# Patient Record
Sex: Male | Born: 1940 | Race: White | Hispanic: No | Marital: Single | Smoking: Former smoker
Health system: Southern US, Community
[De-identification: ages and names within clinical notes are randomized; demographics above are authoritative.]

## PROBLEM LIST (undated history)

## (undated) DIAGNOSIS — C4491 Basal cell carcinoma of skin, unspecified: Secondary | ICD-10-CM

## (undated) DIAGNOSIS — K5900 Constipation, unspecified: Secondary | ICD-10-CM

## (undated) DIAGNOSIS — I5042 Chronic combined systolic (congestive) and diastolic (congestive) heart failure: Secondary | ICD-10-CM

## (undated) DIAGNOSIS — Z9289 Personal history of other medical treatment: Secondary | ICD-10-CM

## (undated) DIAGNOSIS — G8929 Other chronic pain: Secondary | ICD-10-CM

## (undated) DIAGNOSIS — I351 Nonrheumatic aortic (valve) insufficiency: Secondary | ICD-10-CM

## (undated) DIAGNOSIS — I739 Peripheral vascular disease, unspecified: Secondary | ICD-10-CM

## (undated) DIAGNOSIS — I4821 Permanent atrial fibrillation: Secondary | ICD-10-CM

## (undated) DIAGNOSIS — K921 Melena: Secondary | ICD-10-CM

## (undated) DIAGNOSIS — Z89421 Acquired absence of other right toe(s): Secondary | ICD-10-CM

## (undated) DIAGNOSIS — N183 Chronic kidney disease, stage 3 (moderate): Secondary | ICD-10-CM

## (undated) DIAGNOSIS — Z87442 Personal history of urinary calculi: Secondary | ICD-10-CM

## (undated) DIAGNOSIS — I82409 Acute embolism and thrombosis of unspecified deep veins of unspecified lower extremity: Secondary | ICD-10-CM

## (undated) DIAGNOSIS — E785 Hyperlipidemia, unspecified: Secondary | ICD-10-CM

## (undated) DIAGNOSIS — IMO0002 Reserved for concepts with insufficient information to code with codable children: Secondary | ICD-10-CM

## (undated) DIAGNOSIS — K432 Incisional hernia without obstruction or gangrene: Secondary | ICD-10-CM

## (undated) DIAGNOSIS — D649 Anemia, unspecified: Secondary | ICD-10-CM

## (undated) DIAGNOSIS — I5043 Acute on chronic combined systolic (congestive) and diastolic (congestive) heart failure: Secondary | ICD-10-CM

## (undated) DIAGNOSIS — R0600 Dyspnea, unspecified: Secondary | ICD-10-CM

## (undated) DIAGNOSIS — I10 Essential (primary) hypertension: Secondary | ICD-10-CM

## (undated) DIAGNOSIS — M1990 Unspecified osteoarthritis, unspecified site: Secondary | ICD-10-CM

## (undated) DIAGNOSIS — I219 Acute myocardial infarction, unspecified: Secondary | ICD-10-CM

## (undated) DIAGNOSIS — J189 Pneumonia, unspecified organism: Secondary | ICD-10-CM

## (undated) DIAGNOSIS — M1460 Charcot's joint, unspecified site: Secondary | ICD-10-CM

## (undated) DIAGNOSIS — J449 Chronic obstructive pulmonary disease, unspecified: Secondary | ICD-10-CM

## (undated) DIAGNOSIS — I2510 Atherosclerotic heart disease of native coronary artery without angina pectoris: Secondary | ICD-10-CM

## (undated) DIAGNOSIS — R011 Cardiac murmur, unspecified: Secondary | ICD-10-CM

## (undated) DIAGNOSIS — M109 Gout, unspecified: Secondary | ICD-10-CM

## (undated) DIAGNOSIS — I771 Stricture of artery: Secondary | ICD-10-CM

## (undated) DIAGNOSIS — M869 Osteomyelitis, unspecified: Secondary | ICD-10-CM

## (undated) DIAGNOSIS — Z8489 Family history of other specified conditions: Secondary | ICD-10-CM

## (undated) HISTORY — PX: INGUINAL HERNIA REPAIR: SUR1180

## (undated) HISTORY — DX: Essential (primary) hypertension: I10

## (undated) HISTORY — PX: CATARACT EXTRACTION W/ INTRAOCULAR LENS  IMPLANT, BILATERAL: SHX1307

## (undated) HISTORY — PX: ANKLE FRACTURE SURGERY: SHX122

## (undated) HISTORY — PX: FRACTURE SURGERY: SHX138

## (undated) HISTORY — DX: Acute myocardial infarction, unspecified: I21.9

## (undated) HISTORY — DX: Osteomyelitis, unspecified: M86.9

## (undated) HISTORY — PX: LUMBAR SPINE SURGERY: SHX701

## (undated) HISTORY — DX: Other chronic pain: G89.29

## (undated) HISTORY — PX: FEMORAL-POPLITEAL BYPASS GRAFT: SHX937

## (undated) HISTORY — PX: CARDIAC CATHETERIZATION: SHX172

## (undated) HISTORY — DX: Chronic obstructive pulmonary disease, unspecified: J44.9

## (undated) HISTORY — DX: Peripheral vascular disease, unspecified: I73.9

## (undated) HISTORY — DX: Acquired absence of other right toe(s): Z89.421

## (undated) HISTORY — PX: COLONOSCOPY: SHX174

## (undated) HISTORY — PX: PERCUTANEOUS CORONARY STENT INTERVENTION (PCI-S): SHX6016

## (undated) HISTORY — PX: TONSILLECTOMY: SUR1361

## (undated) HISTORY — DX: Hyperlipidemia, unspecified: E78.5

---

## 2003-02-21 HISTORY — PX: CORONARY ANGIOPLASTY WITH STENT PLACEMENT: SHX49

## 2010-01-19 DIAGNOSIS — R32 Unspecified urinary incontinence: Secondary | ICD-10-CM | POA: Insufficient documentation

## 2010-03-10 DIAGNOSIS — N400 Enlarged prostate without lower urinary tract symptoms: Secondary | ICD-10-CM | POA: Insufficient documentation

## 2010-03-10 DIAGNOSIS — R600 Localized edema: Secondary | ICD-10-CM | POA: Insufficient documentation

## 2011-03-23 DIAGNOSIS — M549 Dorsalgia, unspecified: Secondary | ICD-10-CM | POA: Insufficient documentation

## 2011-05-31 DIAGNOSIS — R972 Elevated prostate specific antigen [PSA]: Secondary | ICD-10-CM | POA: Insufficient documentation

## 2012-09-04 DIAGNOSIS — Z7901 Long term (current) use of anticoagulants: Secondary | ICD-10-CM | POA: Insufficient documentation

## 2012-12-05 ENCOUNTER — Encounter: Payer: Self-pay | Admitting: Family Medicine

## 2012-12-05 ENCOUNTER — Ambulatory Visit (INDEPENDENT_AMBULATORY_CARE_PROVIDER_SITE_OTHER): Payer: Medicare Other | Admitting: Family Medicine

## 2012-12-05 ENCOUNTER — Ambulatory Visit (INDEPENDENT_AMBULATORY_CARE_PROVIDER_SITE_OTHER): Payer: Medicare Other | Admitting: *Deleted

## 2012-12-05 VITALS — BP 136/51 | HR 70 | Temp 97.9°F | Ht 77.0 in | Wt 316.6 lb

## 2012-12-05 DIAGNOSIS — I4891 Unspecified atrial fibrillation: Secondary | ICD-10-CM

## 2012-12-05 DIAGNOSIS — E785 Hyperlipidemia, unspecified: Secondary | ICD-10-CM

## 2012-12-05 DIAGNOSIS — I10 Essential (primary) hypertension: Secondary | ICD-10-CM

## 2012-12-05 DIAGNOSIS — Z87891 Personal history of nicotine dependence: Secondary | ICD-10-CM

## 2012-12-05 DIAGNOSIS — Z23 Encounter for immunization: Secondary | ICD-10-CM

## 2012-12-05 DIAGNOSIS — Z0000 Encounter for general adult medical examination without abnormal findings: Secondary | ICD-10-CM | POA: Insufficient documentation

## 2012-12-05 DIAGNOSIS — Z87892 Personal history of anaphylaxis: Secondary | ICD-10-CM

## 2012-12-05 DIAGNOSIS — G8929 Other chronic pain: Secondary | ICD-10-CM | POA: Insufficient documentation

## 2012-12-05 DIAGNOSIS — M549 Dorsalgia, unspecified: Secondary | ICD-10-CM

## 2012-12-05 DIAGNOSIS — I739 Peripheral vascular disease, unspecified: Secondary | ICD-10-CM | POA: Insufficient documentation

## 2012-12-05 DIAGNOSIS — J3489 Other specified disorders of nose and nasal sinuses: Secondary | ICD-10-CM

## 2012-12-05 DIAGNOSIS — I2510 Atherosclerotic heart disease of native coronary artery without angina pectoris: Secondary | ICD-10-CM

## 2012-12-05 DIAGNOSIS — L989 Disorder of the skin and subcutaneous tissue, unspecified: Secondary | ICD-10-CM

## 2012-12-05 DIAGNOSIS — M109 Gout, unspecified: Secondary | ICD-10-CM

## 2012-12-05 DIAGNOSIS — E119 Type 2 diabetes mellitus without complications: Secondary | ICD-10-CM

## 2012-12-05 LAB — POCT INR: INR: 2.5

## 2012-12-05 MED ORDER — EPINEPHRINE 0.3 MG/0.3ML IJ SOAJ: 0.3000 mg | Freq: Once | INTRAMUSCULAR | Status: DC

## 2012-12-05 MED ORDER — NITROGLYCERIN 0.4 MG SL SUBL: 0.4000 mg | SUBLINGUAL_TABLET | SUBLINGUAL | Status: DC | PRN

## 2012-12-05 NOTE — Assessment & Plan Note (Signed)
Currently On Lovastatin. Will obtain fasting lipid panel.

## 2012-12-05 NOTE — Progress Notes (Signed)
Subjective:     Patient ID: Terry Martinez, male   DOB: [birth year], 72 y.o.   MRN: [id number]  HPI 72 year old male presents to the clinic to establish care. Concerns today:  1) INR check  - He needs INR check.  He is on Coumadin for Atrial fibrillation.  - Has not had check for months. - No reports of increased bleeding or blood in urine/stool.   2) Need for Influenza Vaccination - Patient requesting influenza vaccination today.  3) Skin lesion  - Patient reports a history of skin cancer. - He has recently developed a skin lesion on right side of his nose. - This area was scratched and bleeds easily.  It has not healed over several months. - No change in color or size.  He is concerned given his history and slow healing.  PMH, Surgical history, Family history, Social history, and medications reviewed and updated in the medical record.   Review of Systems  General:  Negative for nexplained weight loss, fever Skin: Positive sore that won't heal HEENT: Negative for trouble hearing, trouble seeing, ringing in ears, mouth sores, hoarseness, change in voice, dysphagia. CV:  Negative for chest pain, dyspnea, palpitations; Patient reports LE edema. Resp:  Positive for cough; negative for dyspnea, hemoptysis.  GI: Negative for nausea, vomiting, diarrhea, abdominal pain, melena, hematochezia. Positive for constipation. GU: Negative for dysuria, hematuria, vaginal or penile discharge, polyuria, sexual difficulty, lumps in testicle or breasts; Positive for urinary incontinence.  MSK: Positive for muscle cramps or aches, joint pain or swelling Neuro: Negative for headaches, dizziness, passing out/fainting.  Positive for weakness and numbness of LE. Psych: Negative for depression, anxiety, memory problems    Objective:   Physical Exam Filed Vitals:   12/05/12 1348  BP: 136/51  Pulse: 70  Temp: 97.9 F (36.6 C)   Exam: General: well appearing obese elderly gentlemen in  NAD. Cardiovascular: Irregularly irregular. No murmurs, rubs, or gallops. Respiratory: CTAB. No rales, rhonchi, or wheeze. Abdomen: obese soft, nontender, nondistended.  2 large ventral hernias noted lateral to large midline scar. Extremities: chronic venous stasis changes noted bilaterally. 1-2+ Pitting LE pretibial edema.      Assessment:     See Problem List     Plan:

## 2012-12-05 NOTE — Assessment & Plan Note (Signed)
-   Influenza vaccine given today 

## 2012-12-05 NOTE — Assessment & Plan Note (Signed)
Discussed biopsy with attending Dr. Jennette Kettle. Given location and non-healing nature, will refer to Dermatology.

## 2012-12-05 NOTE — Assessment & Plan Note (Signed)
At goal.  Continue ACEI, Metoprolol, and Lasix. CMP to be obtained when patient returns for fasting bloodwork.

## 2012-12-05 NOTE — Assessment & Plan Note (Signed)
Given cardiac history will refer to cardiology so that patient can be monitored closely.

## 2012-12-05 NOTE — Patient Instructions (Signed)
It was nice to see you today.  I will check your Coumadin levels today.  Please return at your earliest convenience (fasting) for the remainder of your lab work.  Given your cardiac history, I am going to refer you to Cardiology.  Additionally, for the lesion on your nose (given the location) I am going to refer you to dermatology.   Follow up in 1 -3 months or earlier if needed.   Continue to take your medications as prescribed.

## 2012-12-05 NOTE — Assessment & Plan Note (Signed)
INR obtained today - 2.5. Patient to continue current Warfarin regimen.

## 2012-12-06 ENCOUNTER — Other Ambulatory Visit (INDEPENDENT_AMBULATORY_CARE_PROVIDER_SITE_OTHER): Payer: Medicare Other

## 2012-12-06 DIAGNOSIS — E119 Type 2 diabetes mellitus without complications: Secondary | ICD-10-CM

## 2012-12-06 DIAGNOSIS — I10 Essential (primary) hypertension: Secondary | ICD-10-CM

## 2012-12-06 LAB — COMPREHENSIVE METABOLIC PANEL WITH GFR
ALT: 11 U/L (ref 0–53)
AST: 14 U/L (ref 0–37)
Albumin: 3.8 g/dL (ref 3.5–5.2)
Alkaline Phosphatase: 70 U/L (ref 39–117)
BUN: 21 mg/dL (ref 6–23)
CO2: 28 meq/L (ref 19–32)
Calcium: 9.4 mg/dL (ref 8.4–10.5)
Chloride: 104 meq/L (ref 96–112)
Creat: 1.15 mg/dL (ref 0.50–1.35)
Glucose, Bld: 105 mg/dL — ABNORMAL HIGH (ref 70–99)
Potassium: 4.5 meq/L (ref 3.5–5.3)
Sodium: 139 meq/L (ref 135–145)
Total Bilirubin: 0.5 mg/dL (ref 0.3–1.2)
Total Protein: 6.6 g/dL (ref 6.0–8.3)

## 2012-12-06 LAB — CBC
HCT: 39.4 % (ref 39.0–52.0)
Hemoglobin: 13.5 g/dL (ref 13.0–17.0)
MCH: 31.4 pg (ref 26.0–34.0)
MCHC: 34.3 g/dL (ref 30.0–36.0)
RBC: 4.3 MIL/uL (ref 4.22–5.81)

## 2012-12-06 LAB — LIPID PANEL
HDL: 31 mg/dL — ABNORMAL LOW (ref >39)
Total CHOL/HDL Ratio: 2 Ratio
Triglycerides: 61 mg/dL (ref <150)

## 2012-12-06 LAB — POCT GLYCOSYLATED HEMOGLOBIN (HGB A1C): Hemoglobin A1C: 5.7

## 2012-12-06 NOTE — Progress Notes (Signed)
CMP,CBC,FLP AND A1C DONE TODAY Terry Martinez 

## 2012-12-09 ENCOUNTER — Encounter: Payer: Self-pay | Admitting: Family Medicine

## 2013-01-02 ENCOUNTER — Telehealth: Payer: Self-pay | Admitting: Family Medicine

## 2013-01-02 ENCOUNTER — Ambulatory Visit (INDEPENDENT_AMBULATORY_CARE_PROVIDER_SITE_OTHER): Payer: Medicare Other | Admitting: *Deleted

## 2013-01-02 DIAGNOSIS — I4891 Unspecified atrial fibrillation: Secondary | ICD-10-CM

## 2013-01-02 NOTE — Telephone Encounter (Signed)
Pt's sister states that they have not heard anything concerning referral for skin and heart doctors.

## 2013-01-03 NOTE — Telephone Encounter (Signed)
I spoke with Terry Martinez his sister and gave to her Cardiology appt inf I still working in his dermatology appt referral .   Marines

## 2013-01-08 ENCOUNTER — Ambulatory Visit (INDEPENDENT_AMBULATORY_CARE_PROVIDER_SITE_OTHER): Payer: Medicare Other | Admitting: Family Medicine

## 2013-01-08 VITALS — BP 156/64 | HR 64 | Temp 98.6°F | Ht 77.0 in | Wt 379.0 lb

## 2013-01-08 DIAGNOSIS — G8929 Other chronic pain: Secondary | ICD-10-CM

## 2013-01-08 DIAGNOSIS — J3489 Other specified disorders of nose and nasal sinuses: Secondary | ICD-10-CM

## 2013-01-08 DIAGNOSIS — I4891 Unspecified atrial fibrillation: Secondary | ICD-10-CM

## 2013-01-08 DIAGNOSIS — L989 Disorder of the skin and subcutaneous tissue, unspecified: Secondary | ICD-10-CM

## 2013-01-08 DIAGNOSIS — I10 Essential (primary) hypertension: Secondary | ICD-10-CM

## 2013-01-08 DIAGNOSIS — M549 Dorsalgia, unspecified: Secondary | ICD-10-CM

## 2013-01-08 MED ORDER — ALLOPURINOL 100 MG PO TABS: 100.0000 mg | ORAL_TABLET | Freq: Every evening | ORAL | Status: DC

## 2013-01-08 MED ORDER — ZOSTER VACCINE LIVE 19400 UNT/0.65ML ~~LOC~~ SOLR: 0.6500 mL | Freq: Once | SUBCUTANEOUS | Status: DC

## 2013-01-08 MED ORDER — WARFARIN SODIUM 5 MG PO TABS: 5.0000 mg | ORAL_TABLET | Freq: Every day | ORAL | Status: DC

## 2013-01-08 MED ORDER — OXYCODONE-ACETAMINOPHEN 10-325 MG PO TABS: 1.0000 | ORAL_TABLET | Freq: Three times a day (TID) | ORAL | Status: DC | PRN

## 2013-01-08 MED ORDER — ALBUTEROL SULFATE HFA 108 (90 BASE) MCG/ACT IN AERS: 2.0000 | INHALATION_SPRAY | Freq: Four times a day (QID) | RESPIRATORY_TRACT | Status: DC | PRN

## 2013-01-08 NOTE — Patient Instructions (Signed)
Appt on 12/02 @10 :30  [facility] [address]  [place] [address] [phone]  I have refilled your medications.   Follow up in ~ 6 months.  I can refill your medication before then if needed.

## 2013-01-09 NOTE — Assessment & Plan Note (Signed)
Patient to see derm in December.

## 2013-01-09 NOTE — Assessment & Plan Note (Signed)
Percocet refilled today.

## 2013-01-09 NOTE — Assessment & Plan Note (Signed)
Nearly at goal. Meds refilled today.

## 2013-01-09 NOTE — Assessment & Plan Note (Signed)
Warfarin refilled today.   

## 2013-01-09 NOTE — Progress Notes (Signed)
Subjective:     Patient ID: Terry Martinez, male   DOB: 06/09/1940, 72 y.o.   MRN: [id number]  HPI 72 year old male presents for follow up.  1) Chronic back pain - Patient has long-standing chronic back pain.  He has had back surgery (rod placed). - This is managed with PRN Percocet - He is need of refill today. - No worsening in pain.  No reports of LE weakness, numbness, tingling.  2) HTN Disease Monitoring: Home BP Monitoring - No Chest pain- No    Dyspnea- No Medications: Enalapril, Lopressor Compliance-  Yes. Lightheadedness-  No  Edema- No  3) Non Healing Lesion of face - Nose - Lesion is now improving - He is scheduled to see Derm in [facility] on December 2nd.  Review of Systems Per HPI    Objective:   Physical Exam Filed Vitals:   01/08/13 1459  BP: 156/64  Pulse: 64  Temp: 98.6 F (37 C)   Exam: General: well developed, well nourished in NAD. Cardiovascular: RRR. No murmurs, rubs, or gallops. Respiratory: CTAB. No rales, rhonchi, or wheeze. Abdomen: obese, soft, nontender, nondistended. Extremities: warm, well perfused. No LE edema. Skin: Lesion on nose appears to be healing at this time.      Assessment:     See Problem List    Plan:

## 2013-01-20 ENCOUNTER — Encounter: Payer: Self-pay | Admitting: Internal Medicine

## 2013-01-20 ENCOUNTER — Ambulatory Visit (INDEPENDENT_AMBULATORY_CARE_PROVIDER_SITE_OTHER): Payer: Medicare Other | Admitting: Internal Medicine

## 2013-01-20 ENCOUNTER — Telehealth: Payer: Self-pay | Admitting: Internal Medicine

## 2013-01-20 VITALS — BP 120/70 | HR 67 | Ht 77.0 in | Wt 313.8 lb

## 2013-01-20 DIAGNOSIS — I10 Essential (primary) hypertension: Secondary | ICD-10-CM

## 2013-01-20 DIAGNOSIS — I2510 Atherosclerotic heart disease of native coronary artery without angina pectoris: Secondary | ICD-10-CM

## 2013-01-20 DIAGNOSIS — I739 Peripheral vascular disease, unspecified: Secondary | ICD-10-CM

## 2013-01-20 DIAGNOSIS — E785 Hyperlipidemia, unspecified: Secondary | ICD-10-CM

## 2013-01-20 NOTE — Patient Instructions (Signed)
Your physician wants you to follow-up in: 7 MONTHS WITH DR. ROSS You will receive a reminder letter in the mail two months in advance. If you don't receive a letter, please call our office to schedule the follow-up appointment.  Your physician recommends that you continue on your current medications as directed. Please refer to the Current Medication list given to you today.

## 2013-01-20 NOTE — Telephone Encounter (Signed)
ROI faxed to   1. [facility]@ [phone]/call back [phone] 2.[facility] @  [phone]/call back [phone]  01/20/13/KM

## 2013-01-20 NOTE — Progress Notes (Signed)
HPI Moved back to [place] in Sept Dr. Clearance Coots in [place] ([facility]) History of atrial fibrillation.  Has had echo History of CAD  Has 6 stents.   Last in July 2014 Hx of PAD Hadax   fem bypass (Dr Kathrine Haddock) Breathing is overall good  Has a cold right now  Greenish/grayish  Lasted 3 to 4 days.  Getting better No fevers.   No CP Actually has had nol chest pain in past Sweating with MI  In 2005 NO palpittions     Allergies  Allergen Reactions  . Zocor [Simvastatin] Hives    Current Outpatient Prescriptions  Medication Sig Dispense Refill  . acetaminophen (TYLENOL) 500 MG tablet Take 500 mg by mouth every 6 (six) hours as needed for pain.      [name] albuterol (PROVENTIL HFA;VENTOLIN HFA) 108 (90 BASE) MCG/ACT inhaler Inhale 2 puffs into the lungs every 6 (six) hours as needed for wheezing or shortness of breath.  1 Inhaler  2  . allopurinol (ZYLOPRIM) 100 MG tablet Take 1 tablet (100 mg total) by mouth every evening.  90 tablet  3  . allopurinol (ZYLOPRIM) 300 MG tablet Take 300 mg by mouth every morning.      . enalapril (VASOTEC) 10 MG tablet Take 10 mg by mouth daily.      [name] EPINEPHrine (EPI-PEN) 0.3 mg/0.3 mL SOAJ injection Inject 0.3 mLs (0.3 mg total) into the muscle once.  1 Device  1  . ferrous fumarate (HEMOCYTE - 106 MG FE) 325 (106 FE) MG TABS tablet Take 1 tablet by mouth every evening.      . furosemide (LASIX) 40 MG tablet Take 40 mg by mouth daily.      [name] lovastatin (MEVACOR) 20 MG tablet Take 20 mg by mouth at bedtime.      . metoprolol tartrate (LOPRESSOR) 25 MG tablet Take 25 mg by mouth 2 (two) times daily.      . Multiple Vitamins-Minerals (MENS MULTIVITAMIN PLUS PO) Take 1 tablet by mouth daily.      . nitroGLYCERIN (NITROSTAT) 0.4 MG SL tablet Place 1 tablet (0.4 mg total) under the tongue every 5 (five) minutes as needed for chest pain.  90 tablet  3  . oxyCODONE-acetaminophen (PERCOCET) 10-325 MG per tablet Take 1 tablet by mouth every 8 (eight) hours as needed  for pain.  90 tablet  0  . potassium chloride SA (K-DUR,KLOR-CON) 20 MEQ tablet Take 20 mEq by mouth daily.      [name] warfarin (COUMADIN) 5 MG tablet Take 1 tablet (5 mg total) by mouth daily.  90 tablet  3  . zoster vaccine live, PF, (ZOSTAVAX) 16109 UNT/0.65ML injection Inject 19,400 Units into the skin once.  1 each  0   No current facility-administered medications for this visit.    Past Medical History  Diagnosis Date  . Myocardial infarction   . Hyperlipidemia   . Hypertension   . COPD (chronic obstructive pulmonary disease)   . Peripheral artery disease   . Atrial fibrillation   . Chronic pain     Past Surgical History  Procedure Laterality Date  . Hernia repair    . Femoral-popliteal bypass graft    . Percutaneous coronary stent intervention (pci-s)    . Back surgery    . Ankle surgery      Family History  Problem Relation Age of Onset  . Diabetes Mother   . Cancer Mother   . Heart disease Mother   . Hyperlipidemia  Mother   . Hypertension Mother   . Cancer Father   . Cancer Brother   . Heart disease Brother   . Depression Brother   . Early death Brother   . Hyperlipidemia Brother   . Hypertension Brother   . Alcohol abuse Sister   . Heart disease Sister   . Hyperlipidemia Sister   . Hypertension Sister   . Stroke Sister   . Heart disease Maternal Grandmother   . Kidney disease Maternal Grandmother   . Heart disease Maternal Grandfather   . Kidney disease Maternal Grandfather     History   Social History  . Marital Status: Married    Spouse Name: N/A    Number of Children: N/A  . Years of Education: N/A   Occupational History  . Not on file.   Social History Main Topics  . Smoking status: Former Smoker    Quit date: 07/22/1998  . Smokeless tobacco: Current User  . Alcohol Use: No  . Drug Use: Not on file  . Sexual Activity: No   Other Topics Concern  . Not on file   Social History Narrative  . No narrative on file    Review of  Systems:  All systems reviewed.  They are negative to the above problem except as previously stated.  Vital Signs: BP 120/70  Pulse 67  Ht 6\' 5"  (1.956 m)  Wt 313 lb 12.8 oz (142.339 kg)  BMI 37.20 kg/m2  Physical Exam Patient is a morbidly obese 72 yo in NAD HEENT:  Normocephalic, atraumatic. EOMI, PERRLA.  Neck: JVP is normal.  No bruits.  Lungs: clear to auscultation. No rales no wheezes. COurse cough Heart: Regular rate and rhythm. Normal S1, S2. No S3.   No significant murmurs. PMI not displaced.  Abdomen:  Supple.  Minimal diffuse tnederness.  Large ventral hernia that easily reduces.  Extremities:  No signif edema.  Chronic stasis changes.  1+PT R  2+ PT L  Musculoskeletal :moving all extremities.  Neuro:   alert and oriented x3.  CN II-XII grossly intact.  EKG  Atrial fib 67 bpm  Assessment and Plan:  1.  CAD  No symptoms of angina  Need to get records from [facility]  2.  PV disease.  Again, doing OK  Wants to keep f/u down in [place].  Will get records for review  3.  HTN  Good control  Continue meds  4.  HL  Will review.    F/U next July.

## 2013-01-22 ENCOUNTER — Telehealth: Payer: Self-pay | Admitting: Family Medicine

## 2013-01-22 ENCOUNTER — Telehealth: Payer: Self-pay | Admitting: Internal Medicine

## 2013-01-22 NOTE — Telephone Encounter (Signed)
Mr. Belson brother- in-law calling to inquire about rx for the alloprinol 300 mg that wasn't called in with the one for the 100 mg.  Should have been sent along with all the others.  Require 90 day quantity.  Pt down to 2 pills left.  Need to send in asap.  Please call house if there is any question regarding this.

## 2013-01-22 NOTE — Telephone Encounter (Signed)
Records rec From [facility], gave to Scheduling Dept  01/22/13/KM

## 2013-01-23 MED ORDER — ALLOPURINOL 100 MG PO TABS: 100.0000 mg | ORAL_TABLET | Freq: Every evening | ORAL | Status: DC

## 2013-01-23 NOTE — Telephone Encounter (Signed)
Rx sent 

## 2013-01-28 ENCOUNTER — Ambulatory Visit (INDEPENDENT_AMBULATORY_CARE_PROVIDER_SITE_OTHER): Payer: Medicare Other | Admitting: *Deleted

## 2013-01-28 ENCOUNTER — Telehealth: Payer: Self-pay | Admitting: *Deleted

## 2013-01-28 ENCOUNTER — Telehealth: Payer: Self-pay | Admitting: Family Medicine

## 2013-01-28 DIAGNOSIS — I4891 Unspecified atrial fibrillation: Secondary | ICD-10-CM

## 2013-01-28 LAB — POCT INR: INR: 2.9

## 2013-01-28 MED ORDER — ALLOPURINOL 300 MG PO TABS: 300.0000 mg | ORAL_TABLET | Freq: Every morning | ORAL | Status: DC

## 2013-01-28 NOTE — Telephone Encounter (Signed)
Patient is needing a refill of Allopurinol 300mg  called in to [facility] in [place].   He has been out since last week and needs it badly.

## 2013-01-28 NOTE — Telephone Encounter (Signed)
Received a call from the skin surgery center requesting NPI number on this patient, number was given.Terry Martinez, [name]

## 2013-01-30 ENCOUNTER — Ambulatory Visit: Payer: Medicare Other

## 2013-02-04 ENCOUNTER — Telehealth: Payer: Self-pay | Admitting: Internal Medicine

## 2013-02-04 NOTE — Telephone Encounter (Signed)
Records rec From [facility], Will Hold till [place] back In Office

## 2013-02-06 ENCOUNTER — Other Ambulatory Visit: Payer: Self-pay | Admitting: Family Medicine

## 2013-02-06 ENCOUNTER — Encounter: Payer: Self-pay | Admitting: Sports Medicine

## 2013-02-06 ENCOUNTER — Ambulatory Visit (INDEPENDENT_AMBULATORY_CARE_PROVIDER_SITE_OTHER): Payer: Medicare Other | Admitting: Sports Medicine

## 2013-02-06 ENCOUNTER — Ambulatory Visit
Admission: RE | Admit: 2013-02-06 | Discharge: 2013-02-06 | Disposition: A | Discharge status: Home / Self Care | Payer: Medicare Other | Source: Ambulatory Visit | Attending: Family Medicine | Admitting: Family Medicine

## 2013-02-06 VITALS — BP 112/99 | HR 70 | Temp 98.1°F | Ht 77.0 in | Wt 316.0 lb

## 2013-02-06 DIAGNOSIS — R05 Cough: Secondary | ICD-10-CM

## 2013-02-06 MED ORDER — OXYCODONE-ACETAMINOPHEN 10-325 MG PO TABS: 1.0000 | ORAL_TABLET | Freq: Three times a day (TID) | ORAL | Status: DC | PRN

## 2013-02-06 MED ORDER — AZITHROMYCIN 250 MG PO TABS: ORAL_TABLET | ORAL | Status: DC

## 2013-02-06 NOTE — Patient Instructions (Signed)
   Go get a chest x-ray  Keep Vaseline on your foot until this is healed.  If it starts turning red or you start having worsening fevers or chills please call us back.   Please followup with Dr. Adriana Simas to discuss your ongoing pain management.   If you need anything prior to your next visit please call the clinic. Please Bring all medications or accurate medication list with you to each appointment; an accurate medication list is essential in providing you the best care possible.

## 2013-02-06 NOTE — Progress Notes (Signed)
  Terry Martinez - 72 y.o. male MRN [id number]  Date of birth: [birth year]  CC, HPI, INTERVAL HISTORY & ROS  Terry Martinez is here today for subacute URI like symptoms, right foot lesion    He reports he has been coughing for approximately 10 days; productive green mucus.  He's been running subjective fevers. Nasal congestion and rhinorrhea, no hearing changes or ear fullness.  Has not tried any specific treatments but seems to be worsening  He has no known sick contacts.  Denies nausea, vomiting, diarrhea.  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  Noted 3 days ago a small area of eschar.  Has been treating with antibacterial ointment.  No pain or erythema.  Pt denies chest pain, dyspnea at rest or exertion, PND, lower extremity edema.  History  Past Medical, Surgical, Social, and Family History Reviewed per EMR Medications and Allergies reviewed and all updated if necessary. Objective Findings  VITALS: HR: 70 bpm  BP: 112/99 mmHg  TEMP: 98.1 F (36.7 C) (Oral)  RESP: 97 %  HT: 6\' 5"  (195.6 cm)  WT: 316 lb (143.337 kg)  BMI: 37.6   BP Readings from Last 3 Encounters:  02/06/13 112/99  01/20/13 120/70  01/08/13 156/64   Wt Readings from Last 3 Encounters:  02/06/13 316 lb (143.337 kg)  01/20/13 313 lb 12.8 oz (142.339 kg)  01/08/13 379 lb (171.913 kg)     PHYSICAL EXAM: GENERAL: Adult morbidly obese Caucasian male  male. In no discomfort; no respiratory distress  PSYCH: alert and appropriate, good insight   HNEENT: H&N: AT/[place], trachea midline, no anterior cervical lymphadenopathy  Eyes: no scleral icterus, no conjunctival exudate  Ears: Bilateral tympanic membranes clear without erythema, no air fluid level  Nose: Bilateral nasal congestion  Oropharynx: MMM, no posterior oropharyngeal erythema  Dentention:     CARDIO: RRR, S1/S2 heard, no murmur  LUNGS: Slight crackles in left lower lobe but clears with coughing, no wheezing no respiratory distress  ABDOMEN:    EXTREM:  Right first toe with partial amputation with a 0.5 cm x 0.7 cm area of ecchymosis without surrounding erythema, no edema, capillary refill less than 2 seconds.  Other areas of callus on the distal first toe adjacent to this region.  GU:   SKIN:     Assessment & Plan   Problems addressed today: General Plan & Pt Instructions:  1. Cough   2. Foot lesion - consistent with blood blister    Go get a chest x-ray  Keep Vaseline on your foot until this is healed.  If it starts turning red or you start having worsening fevers or chills please call us back.   Please followup with Dr. Adriana Simas to discuss your ongoing pain management.     For further discussion of A/P and for follow up issues see problem based charting if applicable.

## 2013-02-06 NOTE — Assessment & Plan Note (Signed)
2 weeks of URI like symptoms.  Subjective fevers. Obtain chest x-ray, if negative will provide azithromycin for bronchitis.  If evidence of pneumonia treat with fluoroquinolone

## 2013-02-07 ENCOUNTER — Telehealth: Payer: Self-pay | Admitting: *Deleted

## 2013-02-07 NOTE — Telephone Encounter (Signed)
Message copied by Farrell Ours on Fri Feb 07, 2013 10:56 AM ------      Message from: Gaspar Bidding D      Created: Thu Feb 06, 2013 12:07 PM       Azithro for acute bronchitis. ------

## 2013-02-07 NOTE — Telephone Encounter (Signed)
LVM for patient to call back. ?

## 2013-02-24 ENCOUNTER — Telehealth: Payer: Self-pay | Admitting: Family Medicine

## 2013-02-24 ENCOUNTER — Ambulatory Visit (INDEPENDENT_AMBULATORY_CARE_PROVIDER_SITE_OTHER): Payer: Medicare Other | Admitting: *Deleted

## 2013-02-24 DIAGNOSIS — I4891 Unspecified atrial fibrillation: Secondary | ICD-10-CM

## 2013-02-24 LAB — POCT INR: INR: 2

## 2013-02-24 NOTE — Telephone Encounter (Signed)
Pt came by to have prescription called in for LOVASTATIN 20MG .

## 2013-02-25 ENCOUNTER — Ambulatory Visit: Payer: Medicare Other

## 2013-02-25 MED ORDER — LOVASTATIN 20 MG PO TABS: 20.0000 mg | ORAL_TABLET | Freq: Every day | ORAL | Status: DC

## 2013-02-25 NOTE — Telephone Encounter (Signed)
Statin refilled

## 2013-03-25 ENCOUNTER — Ambulatory Visit: Payer: Medicare Other

## 2013-03-27 ENCOUNTER — Other Ambulatory Visit: Payer: Self-pay | Admitting: Family Medicine

## 2013-03-27 ENCOUNTER — Ambulatory Visit (INDEPENDENT_AMBULATORY_CARE_PROVIDER_SITE_OTHER): Payer: Medicare Other | Admitting: *Deleted

## 2013-03-27 DIAGNOSIS — I4891 Unspecified atrial fibrillation: Secondary | ICD-10-CM

## 2013-03-27 LAB — POCT INR: INR: 2.5

## 2013-03-27 MED ORDER — OXYCODONE-ACETAMINOPHEN 10-325 MG PO TABS: 1.0000 | ORAL_TABLET | Freq: Three times a day (TID) | ORAL | Status: DC | PRN

## 2013-04-24 ENCOUNTER — Ambulatory Visit: Payer: Medicare Other

## 2013-04-28 ENCOUNTER — Telehealth: Payer: Self-pay | Admitting: Family Medicine

## 2013-04-29 ENCOUNTER — Ambulatory Visit: Payer: Medicare Other

## 2013-04-30 ENCOUNTER — Ambulatory Visit (INDEPENDENT_AMBULATORY_CARE_PROVIDER_SITE_OTHER): Payer: Medicare Other | Admitting: *Deleted

## 2013-04-30 ENCOUNTER — Encounter: Payer: Self-pay | Admitting: Family Medicine

## 2013-04-30 ENCOUNTER — Ambulatory Visit (INDEPENDENT_AMBULATORY_CARE_PROVIDER_SITE_OTHER): Payer: Medicare Other | Admitting: Family Medicine

## 2013-04-30 VITALS — BP 143/81 | HR 73 | Temp 98.1°F | Ht 77.0 in | Wt 320.0 lb

## 2013-04-30 DIAGNOSIS — I4891 Unspecified atrial fibrillation: Secondary | ICD-10-CM

## 2013-04-30 DIAGNOSIS — S8990XA Unspecified injury of unspecified lower leg, initial encounter: Secondary | ICD-10-CM

## 2013-04-30 DIAGNOSIS — S99919A Unspecified injury of unspecified ankle, initial encounter: Secondary | ICD-10-CM

## 2013-04-30 DIAGNOSIS — Z23 Encounter for immunization: Secondary | ICD-10-CM

## 2013-04-30 DIAGNOSIS — L609 Nail disorder, unspecified: Secondary | ICD-10-CM

## 2013-04-30 DIAGNOSIS — S99929A Unspecified injury of unspecified foot, initial encounter: Secondary | ICD-10-CM | POA: Insufficient documentation

## 2013-04-30 LAB — POCT INR: INR: 2.3

## 2013-04-30 NOTE — Progress Notes (Signed)
   Subjective:    Patient ID: Terry Martinez, male    DOB: 1940/06/20, 73 y.o.   MRN: [id number]  HPI 73 year old male presents for evaluation of right big toe blister.  Patient reports that about 1 month ago he developed a blister of his R big toe (this began after a long day of activity).  It has continued to persist and has not resolved.  It is non painful.  He denies any open wound or drainage.  He has been keeping the area clean and been covering it with bandaids.  His sister has been monitoring it for him on a regular basis.  Review of Systems Per HPI    Objective:   Physical Exam Filed Vitals:   04/30/13 0951  BP: 143/81  Pulse: 73  Temp: 98.1 F (36.7 C)   General: well appearing obese gentleman in NAD.  Extremities: Right foot - Distal phalanx of right big toe has been removed.  There are 3 small blisters on the distal portion of the big toe. One of them has a overlying echar. No open lesions.  No drainage.  There is also an area of callus on the medial aspect with characteristic appearance of a plantar wart underneath.    Assessment & Plan:  See Problem list

## 2013-04-30 NOTE — Assessment & Plan Note (Signed)
No evidence of infection at this time. Callus removed today. Advised patient to apply Duofilm for plantar wart.  Advised close monitoring at home. Patient also desires referral to Podiatry as he was previously followed by them. Will place today.

## 2013-04-30 NOTE — Patient Instructions (Addendum)
It was nice to see you today.  Your toe is not infected.   Continue checking it frequently.  Pick up some Duofilm at your local drug store and apply to the area.   Continue applying bandaids daily.   Follow up with me in 1-3 months or earlier if needed.   Our office will be in contact regarding your referral to podiatry.

## 2013-05-05 ENCOUNTER — Encounter: Payer: Self-pay | Admitting: Podiatry

## 2013-05-05 ENCOUNTER — Ambulatory Visit (INDEPENDENT_AMBULATORY_CARE_PROVIDER_SITE_OTHER): Payer: Medicare Other | Admitting: Podiatry

## 2013-05-05 VITALS — BP 132/62 | HR 64 | Resp 12

## 2013-05-05 DIAGNOSIS — L97509 Non-pressure chronic ulcer of other part of unspecified foot with unspecified severity: Secondary | ICD-10-CM

## 2013-05-05 MED ORDER — SULFAMETHOXAZOLE-TMP DS 800-160 MG PO TABS: 1.0000 | ORAL_TABLET | Freq: Two times a day (BID) | ORAL | Status: DC

## 2013-05-05 NOTE — Progress Notes (Signed)
   Subjective:    Patient ID: Eustaquio Boyden, male    DOB: 01/28/1941, 73 y.o.   MRN: [id number]  HPI PT STATED RT FOOT GREAT TOE HAVE AN OPEN SORE AND DRAINIGE FOR 1 MONTH. THE TOE IS GETTING WORSE IS STARTED WITH BLISTER. THE TOE CANNOT FEEL BECAUSE OF THE NEUROPATHY.  DR. Lacinda Axon KEEP IT TRIM AND USED  DOCUFLIN  ONE TIME.    Review of Systems  Respiratory: Positive for wheezing.   Gastrointestinal: Positive for constipation.  Endocrine: Positive for cold intolerance, polyphagia and polyuria.  Musculoskeletal: Positive for back pain and gait problem.  Neurological: Positive for numbness.  Hematological: Bruises/bleeds easily.  All other systems reviewed and are negative.       Objective:   Physical Exam        Assessment & Plan:

## 2013-05-07 NOTE — Progress Notes (Signed)
Subjective:     Patient ID: Terry Martinez, male   DOB: [birth year], 73 y.o.   MRN: [id number]  Toe Pain    patient presents stating my right big toe has tissue that is irritated and I was just worried that it could be a problem   Review of Systems  All other systems reviewed and are negative.       Objective:   Physical Exam  Nursing note and vitals reviewed. Constitutional: He is oriented to person, place, and time.  Cardiovascular: Intact distal pulses.   Musculoskeletal: Normal range of motion.  Neurological: He is oriented to person, place, and time.  Skin: Skin is warm.   vascular status intact neurologically I noted diminishment of sharp dull and vibratory and keratotic tissue right hallux with slight breakdown in the middle of the area that is nonpainful when pressed with no odor or drainage noted. 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 Using sterile instrumentation debridement accomplished with various superficial subcutaneous exposure with flushing of the area and no drainage noted no odor or proximal extension applied Iodosorb and sterile dressing and instructed if any increased redness edema or any systemic signs of infection were to occur to contact us or go straight to the emergency room should be self-limiting

## 2013-05-15 ENCOUNTER — Telehealth: Payer: Self-pay | Admitting: *Deleted

## 2013-05-15 NOTE — Telephone Encounter (Signed)
I called and informed her Dr. Paulla Dolly said he needs to continue taking the antibiotic.  She said okay.

## 2013-05-15 NOTE — Telephone Encounter (Signed)
Calling regarding my husband's toe on his right foot.  Saw Dr. Paulla Dolly last week and he said to see him if the toe is not getting better.  We will be up that way tomorrow or does he want him to go ahead and get another refill on his antibiotic?  It's still draining and bleeding.

## 2013-05-16 ENCOUNTER — Encounter: Payer: Self-pay | Admitting: *Deleted

## 2013-05-16 ENCOUNTER — Ambulatory Visit (INDEPENDENT_AMBULATORY_CARE_PROVIDER_SITE_OTHER): Payer: Medicare Other | Admitting: *Deleted

## 2013-05-16 ENCOUNTER — Ambulatory Visit (INDEPENDENT_AMBULATORY_CARE_PROVIDER_SITE_OTHER): Payer: Medicare Other | Admitting: Sports Medicine

## 2013-05-16 VITALS — BP 121/52 | HR 60 | Temp 97.8°F | Ht 77.0 in | Wt 315.0 lb

## 2013-05-16 DIAGNOSIS — I951 Orthostatic hypotension: Secondary | ICD-10-CM

## 2013-05-16 DIAGNOSIS — I4891 Unspecified atrial fibrillation: Secondary | ICD-10-CM

## 2013-05-16 DIAGNOSIS — I2510 Atherosclerotic heart disease of native coronary artery without angina pectoris: Secondary | ICD-10-CM

## 2013-05-16 LAB — BASIC METABOLIC PANEL
BUN: 33 mg/dL — ABNORMAL HIGH (ref 6–23)
CALCIUM: 9.3 mg/dL (ref 8.4–10.5)
CHLORIDE: 97 meq/L (ref 96–112)
CO2: 26 meq/L (ref 19–32)
Creat: 2.07 mg/dL — ABNORMAL HIGH (ref 0.50–1.35)
Glucose, Bld: 93 mg/dL (ref 70–99)
Potassium: 5.2 mEq/L (ref 3.5–5.3)
SODIUM: 134 meq/L — AB (ref 135–145)

## 2013-05-16 LAB — POCT INR: INR: 5.8

## 2013-05-16 LAB — PROTIME-INR
INR: 4.43 — ABNORMAL HIGH (ref <1.50)
Prothrombin Time: 40.5 seconds — ABNORMAL HIGH (ref 11.6–15.2)

## 2013-05-16 NOTE — Progress Notes (Signed)
Terry Martinez - 73 y.o. male MRN [id number]  Date of birth: 30-Apr-1940  SUBJECTIVE:     CC: Dizziness and Anticoagulation See problem based charting for additional subjective (including HPI, Interval History & ROS)   He initially presented today for a Coumadin check because he thinks his levels may be off after starting a new antibiotic.  He reports this morning he had a single episode of orthostasis when arising from bed it lasted for 10-20 seconds and spontaneously resolved without fall.  Pt denies chest pain, dyspnea at rest or exertion, PND, lower extremity edema.  Patient denies any facial asymmetry, unilateral weakness, or dysarthria.  He has been started on Bactrim by his podiatrist and is to resume an additional seven-day course tomorrow.  HISTORY: Wt Readings from Last 3 Encounters:  05/16/13 315 lb (142.883 kg)  04/30/13 320 lb (145.151 kg)  02/06/13 316 lb (143.337 kg)   BP Readings from Last 3 Encounters:  05/16/13 121/52  05/05/13 132/62  04/30/13 143/81    History  Smoking status  . Former Smoker  . Quit date: 07/22/1998  Smokeless tobacco  . Current User   Health Maintenance Due  Topic  . Colonoscopy   . Zostavax     Otherwise past Medical, Surgical, Social, and Family History Reviewed per EMR Medications and Allergies reviewed and updated per below.  VITALS: BP 121/52  Pulse 60  Temp(Src) 97.8 F (36.6 C) (Oral)  Ht 6\' 5"  (1.956 m)  Wt 315 lb (142.883 kg)  BMI 37.35 kg/m2  PHYSICAL EXAM: GENERAL:  obese, Caucasian male. In no discomfort; no respiratory distress  PSYCH: alert and appropriate, good insight   HNEENT:  no JVD, dry mucous membranes   CARDIO:  irregularly irregular, S1/S2 heard, no murmur  LUNGS: CTA B, no wheezes, no crackles  ABDOMEN:  protuberant with compressive ventral hernia   EXTREM:  Warm, well perfused.  Moves all 4 extremities spontaneously; no lateralization.  Feet not examined,  Distal pulses normal.  No pretibial edema.    GU:   SKIN:     MEDICATIONS, LABS & OTHER ORDERS: Previous Medications   ACETAMINOPHEN (TYLENOL) 500 MG TABLET    Take 500 mg by mouth every 6 (six) hours as needed for pain.   ALBUTEROL (PROVENTIL HFA;VENTOLIN HFA) 108 (90 BASE) MCG/ACT INHALER    Inhale 2 puffs into the lungs every 6 (six) hours as needed for wheezing or shortness of breath.   ALLOPURINOL (ZYLOPRIM) 100 MG TABLET    Take 1 tablet (100 mg total) by mouth every evening.   ALLOPURINOL (ZYLOPRIM) 300 MG TABLET    Take 1 tablet (300 mg total) by mouth every morning.   AZITHROMYCIN (ZITHROMAX) 250 MG TABLET    2 tablets today by mouth then one tablet by mouth daily.   ENALAPRIL (VASOTEC) 10 MG TABLET    Take 10 mg by mouth daily.   EPINEPHRINE (EPI-PEN) 0.3 MG/0.3 ML SOAJ INJECTION    Inject 0.3 mLs (0.3 mg total) into the muscle once.   FERROUS FUMARATE (HEMOCYTE - 106 MG FE) 325 (106 FE) MG TABS TABLET    Take 1 tablet by mouth every evening.   FUROSEMIDE (LASIX) 40 MG TABLET    Take 40 mg by mouth daily.   LOVASTATIN (MEVACOR) 20 MG TABLET    Take 1 tablet (20 mg total) by mouth at bedtime.   METOPROLOL TARTRATE (LOPRESSOR) 25 MG TABLET    Take 25 mg by mouth 2 (two) times daily.   MULTIPLE  VITAMINS-MINERALS (MENS MULTIVITAMIN PLUS PO)    Take 1 tablet by mouth daily.   NITROGLYCERIN (NITROSTAT) 0.4 MG SL TABLET    Place 1 tablet (0.4 mg total) under the tongue every 5 (five) minutes as needed for chest pain.   OXYCODONE-ACETAMINOPHEN (PERCOCET) 10-325 MG PER TABLET    Take 1 tablet by mouth every 8 (eight) hours as needed for pain.   POTASSIUM CHLORIDE SA (K-DUR,KLOR-CON) 20 MEQ TABLET    Take 20 mEq by mouth daily.   SULFAMETHOXAZOLE-TRIMETHOPRIM (BACTRIM DS) 800-160 MG PER TABLET    Take 1 tablet by mouth 2 (two) times daily.   WARFARIN (COUMADIN) 5 MG TABLET    Take 1 tablet (5 mg total) by mouth daily.   ZOSTER VACCINE LIVE, PF, (ZOSTAVAX) 34193 UNT/0.65ML INJECTION    Inject 19,400 Units into the skin once.   Modified  Medications   No medications on file   New Prescriptions   No medications on file   Discontinued Medications   No medications on file   Orders Placed This Encounter  Procedures  . Basic Metabolic Panel   ASSESSMENT & PLAN: See problem based charting & AVS for pt instructions.

## 2013-05-19 DIAGNOSIS — I951 Orthostatic hypotension: Secondary | ICD-10-CM | POA: Insufficient documentation

## 2013-05-19 NOTE — Assessment & Plan Note (Addendum)
Single isolated episode.  Patient appears to be volume down.  Instructed to hold Lasix and to perform daily weight measurements.  Although likely not associated with his supratherapeutic INR caution obviously must be taken to avoid falls at this time.  Patient is aware of this.

## 2013-05-19 NOTE — Assessment & Plan Note (Signed)
Single short-term isolated episode of orthostasis likely not associated with A. fib with RVR.  Patient reports good medication compliance.  No changes to regimen.   Coumadin recommended to be held over the weekend and reevaluated next week with modified regimen.  Patient has 7 days left of antibiotic.

## 2013-05-22 ENCOUNTER — Ambulatory Visit: Payer: Medicare Other | Admitting: *Deleted

## 2013-05-22 ENCOUNTER — Ambulatory Visit (INDEPENDENT_AMBULATORY_CARE_PROVIDER_SITE_OTHER): Payer: Medicare Other | Admitting: *Deleted

## 2013-05-22 DIAGNOSIS — I4891 Unspecified atrial fibrillation: Secondary | ICD-10-CM

## 2013-05-22 LAB — POCT INR: INR: 1.6

## 2013-05-22 NOTE — Addendum Note (Signed)
Addended by: Lianne Bushy on: 05/22/2013 11:33 AM   Modules accepted: Level of Service

## 2013-05-27 ENCOUNTER — Ambulatory Visit: Payer: Medicare Other

## 2013-05-30 ENCOUNTER — Ambulatory Visit: Payer: Medicare Other | Admitting: Podiatrist

## 2013-05-30 ENCOUNTER — Ambulatory Visit (INDEPENDENT_AMBULATORY_CARE_PROVIDER_SITE_OTHER): Payer: Medicare Other | Admitting: *Deleted

## 2013-05-30 DIAGNOSIS — I4891 Unspecified atrial fibrillation: Secondary | ICD-10-CM

## 2013-05-30 LAB — POCT INR: INR: 1.9

## 2013-06-10 ENCOUNTER — Ambulatory Visit: Payer: Medicare Other

## 2013-06-12 ENCOUNTER — Ambulatory Visit (INDEPENDENT_AMBULATORY_CARE_PROVIDER_SITE_OTHER): Payer: Medicare Other | Admitting: *Deleted

## 2013-06-12 ENCOUNTER — Ambulatory Visit (INDEPENDENT_AMBULATORY_CARE_PROVIDER_SITE_OTHER): Payer: Medicare Other | Admitting: Podiatry

## 2013-06-12 ENCOUNTER — Telehealth: Payer: Self-pay | Admitting: *Deleted

## 2013-06-12 ENCOUNTER — Encounter: Payer: Self-pay | Admitting: Podiatry

## 2013-06-12 VITALS — BP 120/74 | HR 60 | Resp 12

## 2013-06-12 DIAGNOSIS — L97509 Non-pressure chronic ulcer of other part of unspecified foot with unspecified severity: Secondary | ICD-10-CM

## 2013-06-12 DIAGNOSIS — I4891 Unspecified atrial fibrillation: Secondary | ICD-10-CM

## 2013-06-12 LAB — POCT INR: INR: 2

## 2013-06-12 NOTE — Telephone Encounter (Signed)
Dr Paulla Dolly ordered Iodosorb gel to be applied to Right 1st toe ulcer 1 mm x 1 mm x .5 mm with low exudate for 30 days.  Faxed to [facility].

## 2013-06-12 NOTE — Progress Notes (Signed)
Subjective:     Patient ID: Terry Martinez, male   DOB: 02-26-1940, 73 y.o.   MRN: [id number]  HPI patient states that this area on my right big toe is open and it irritated and I don't think it's been able to heal. Patient presents with caregiver pointing to the plantar aspect of the right big toe   Review of Systems     Objective:   Physical Exam Neurovascular status intact with a small opening plantar right big toe measuring approximately 4 x 4 mm with minimal subcutaneous exposure no proximal edema erythema or drainage was noted    Assessment:     Nonhealing small ulceration plantar right hallux with no indications of active infection    Plan:     Educated patient on this and recommended a home Iodosorb usage with request to be made to [facility] for this patient. I debrided the area flushed it applied Iodosorb dressing and I gave him pants to use at home along with paper tape to keep all pressure off the area. If any redness any drainage or change he should occur patient is to let us know immediately I'm still hopeful that this will heal over time with medication and offloading. Reappoint for [place] to recheck again in 3 weeks earlier if any issues should occur

## 2013-06-23 ENCOUNTER — Encounter: Payer: Self-pay | Admitting: Family Medicine

## 2013-06-23 ENCOUNTER — Encounter: Payer: Self-pay | Admitting: Podiatry

## 2013-06-23 ENCOUNTER — Ambulatory Visit (INDEPENDENT_AMBULATORY_CARE_PROVIDER_SITE_OTHER): Payer: Medicare Other | Admitting: Family Medicine

## 2013-06-23 ENCOUNTER — Ambulatory Visit (INDEPENDENT_AMBULATORY_CARE_PROVIDER_SITE_OTHER): Payer: Medicare Other | Admitting: *Deleted

## 2013-06-23 ENCOUNTER — Ambulatory Visit (INDEPENDENT_AMBULATORY_CARE_PROVIDER_SITE_OTHER): Payer: Medicare Other | Admitting: Podiatry

## 2013-06-23 ENCOUNTER — Telehealth: Payer: Self-pay | Admitting: *Deleted

## 2013-06-23 VITALS — BP 118/59 | HR 83 | Resp 16

## 2013-06-23 VITALS — BP 129/63 | HR 60 | Temp 97.6°F | Ht 77.0 in | Wt 316.0 lb

## 2013-06-23 DIAGNOSIS — Z0000 Encounter for general adult medical examination without abnormal findings: Secondary | ICD-10-CM

## 2013-06-23 DIAGNOSIS — I10 Essential (primary) hypertension: Secondary | ICD-10-CM

## 2013-06-23 DIAGNOSIS — Z1211 Encounter for screening for malignant neoplasm of colon: Secondary | ICD-10-CM

## 2013-06-23 DIAGNOSIS — L97509 Non-pressure chronic ulcer of other part of unspecified foot with unspecified severity: Secondary | ICD-10-CM

## 2013-06-23 DIAGNOSIS — I4891 Unspecified atrial fibrillation: Secondary | ICD-10-CM

## 2013-06-23 DIAGNOSIS — L84 Corns and callosities: Secondary | ICD-10-CM

## 2013-06-23 DIAGNOSIS — K5900 Constipation, unspecified: Secondary | ICD-10-CM

## 2013-06-23 LAB — BASIC METABOLIC PANEL
BUN: 19 mg/dL (ref 6–23)
CHLORIDE: 101 meq/L (ref 96–112)
CO2: 25 meq/L (ref 19–32)
Calcium: 9.4 mg/dL (ref 8.4–10.5)
Creat: 1.31 mg/dL (ref 0.50–1.35)
Glucose, Bld: 101 mg/dL — ABNORMAL HIGH (ref 70–99)
POTASSIUM: 4.5 meq/L (ref 3.5–5.3)
SODIUM: 137 meq/L (ref 135–145)

## 2013-06-23 LAB — POCT INR: INR: 2.1

## 2013-06-23 MED ORDER — POLYETHYLENE GLYCOL 3350 17 GM/SCOOP PO POWD: 17.0000 g | Freq: Two times a day (BID) | ORAL | Status: DC | PRN

## 2013-06-23 NOTE — Addendum Note (Signed)
Addended by: Coral Spikes on: 06/23/2013 01:52 PM   Modules accepted: Orders

## 2013-06-23 NOTE — Patient Instructions (Addendum)
It was nice to see you today.  Use the Miralax as indicated for intermittent constipation.  Follow up in 6 months or earlier if needed.

## 2013-06-23 NOTE — Assessment & Plan Note (Signed)
Advised daily Miralax. Also gave titration instructions if constipation recurs.

## 2013-06-23 NOTE — Assessment & Plan Note (Signed)
Well controlled. Will continue current therapy. Repeating BMP today given elevated Creatinine on last check (likely secondary to overdiuresis by patient)

## 2013-06-23 NOTE — Assessment & Plan Note (Signed)
Offered Zostavax today; patient declined. Placing referral to GI as patient is in need of colonoscopy.

## 2013-06-23 NOTE — Telephone Encounter (Signed)
Received message form Santiago Glad at [facility] called needing to verify quanity of Miralax 3350 Grams.  Per Santiago Glad if pt needs 17 Grams twice daily, pt will need two bottles of 527 Grams for a 30 day supply.  Please call to verify quantity [phone] [id number]. Derl Barrow, RN

## 2013-06-23 NOTE — Progress Notes (Signed)
   Subjective:    Patient ID: Terry Martinez, male    DOB: 15-Sep-1940, 73 y.o.   MRN: [id number]  HPI 73 year old male with multiple co-morbitidies presents for follow up.  1) Constipation - Patient reports recent constipation with associated abdominal pain - Follow enema, he had a BM and is now having regular BM's without difficulty. - Pain now resolved.  2) HTN Medications:Metoprolol, Enalapril Compliance -  Yes ROS: Denies chest pain, SOB, lightheadedness/dizziness   Review of Systems Per HPI    Objective:   Physical Exam Filed Vitals:   06/23/13 0913  BP: 129/63  Pulse: 60  Temp: 97.6 F (36.4 C)   Exam: General: well appearing, NAD. Cardiovascular: RRR. No murmurs, rubs, or gallops. Respiratory: CTAB. No rales, rhonchi, or wheeze. Abdomen: obese, soft, nontender, nondistended.  Midline hernias noted.  Extremities: 1+ LE edema with evidence of chronic venous stasis.    Assessment & Plan:  See Problem List

## 2013-06-23 NOTE — Telephone Encounter (Signed)
[facility]  Advanced Heart Failure and Transplant  Heart Transplant Clinic  Follow-up Visit    Primary Care Physician: Brodsky, Mark E  Referring Provider: Brett Justin Berman  Date of Transplant: 04/11/2019  Organ(s) Transplanted: heart  Indication for transplant: Dilated Myopathy: Idiopathic  PHS increased risk donor: Yes    ID. 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    Interval History:    The patient was last seen on 06/06/21. At that time issues with pain after urologic procedure.    He continues to deal with pain issue largely from prostate surgery. He still has some bleeding and some tissue come out. He tried different strategies and nothing helped. This is really impacting quality of life. He gets tired and frustrated and does not want to take it out on his family.    ROS:  A complete ROS was performed and is negative except as documented in the HPI.      Allergies:  Patient is allergic to cats [other] and dogs [other].    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    Pericardial effusion    Hypertension    Chronic back pain    At risk for infection transmitted from donor    Acute hepatitis C virus infection    Heart transplanted (CMS-HCC)    Acute UTI    Umbilical hernia without obstruction and without gangrene    COVID-19 virus detected    Acute medial meniscus tear of left knee, sequela    Localized osteoarthritis of left knee     Past Surgical History:   Procedure Laterality Date    CARDIAC DEFIBRILLATOR PLACEMENT       PB ANESTH,SHOULDER JOINT,NOS Right      Family History   Problem Relation Name Age of Onset    Hypertension Other      Other Maternal Grandmother          kidney disease needing HD     Social History     Socioeconomic History    Marital status: Single     Spouse name: Not on file    Number of children: Not on file    Years of education: Not on file    Highest education level: Not on file   Occupational History    Not on file   Tobacco Use    Smoking status: Never    Smokeless tobacco: Never    Tobacco comments:     from friends and relatives    Substance and Sexual Activity    Alcohol use: Not Currently     Comment: Prior heavier use, but completely quit in 2016    Drug use: Yes     Comment: eats edible marijuana for pain and insomnia     Sexual activity: Not on file   Other Topics Concern    Not on file   Social History Narrative      Born in [place], also lived in [place], [place], [place], no travel, worked as a carpenter, occasional cedar, no birds, no hot tubs, worked in construction + possible asbestos exposure      Social Determinants of Health     Financial Resource Strain: Not on file   Food Insecurity: Not on file   Transportation Needs: Not on file   Physical Activity: Not on file   Stress: Not on file   Social Connections: Not on file   Intimate Partner Violence: Not on file   Housing Stability: Not on file     Current Outpatient Medications   Medication Sig    albuterol 108 (90 Base) MCG/ACT inhaler Inhale 2 puffs by mouth every 4 hours as needed for Wheezing or Shortness of Breath.    aspirin 81 MG EC tablet Take 1 tablet (81 mg) by mouth daily.    baclofen (LIORESAL) 10 MG tablet Take 2 tablets (20 mg) by mouth nightly.    Blood Glucose Monitoring Suppl (TRUE METRIX METER) w/Device KIT Use as directed    budesonide-formoterol (SYMBICORT) 160-4.5 MCG/ACT inhaler Inhale 2 puffs by mouth every 12 hours.    bumetanide (BUMEX) 1 MG tablet Take 1 tablet (1 mg) by mouth daily as needed (fluid/weight  gain). Do not take unless instructed by Transplant team.    Calcium Carb-Cholecalciferol 600-10 MG-MCG TABS Take 1 tablet by mouth 2 times daily.    Cetirizine HCl (ZERVIATE) 0.24 % SOLN Place 1 drop into both eyes 2 times daily.    clindamycin (CLEOCIN T) 1 % solution Apply 1 Application. topically 2 times daily. Apply to the red bumps on your face up to two times a day.    controlled substance agreement controlled substance agreement    diclofenac (VOLTAREN) 1 % gel Apply 2 g topically 4 times daily.    docusate sodium (COLACE) 100 MG capsule Take 1 capsule (100 mg) by mouth 2 times daily.    DULoxetine (CYMBALTA) 30 MG CR capsule Take 1 capsule (30 mg) by mouth daily.    famotidine (PEPCID) 20 MG tablet Take 1 tablet (20 mg) by mouth 2 times daily.    fluticasone propionate (FLONASE) 50 MCG/ACT nasal spray Spray 1 spray into each nostril 2 times daily.    gabapentin (NEURONTIN) 300 MG capsule Take 1 capsule (300 mg) by mouth every morning AND 1 capsule (300 mg) daily AND 2 capsules (600 mg) every evening.    hydroCHLOROthiazide (HYDRODIURIL) 25 MG tablet Take 1 tablet (25 mg) by mouth daily.    ketoconazole (NIZORAL) 2 % shampoo Use shampoo daily for dandruff    lidocaine (LIDOCAINE PAIN RELIEF) 4 % patch Apply 1 patch topically every 24 hours. Leave patch on for 12 hours, then remove for 12 hours.    lisinopril (PRINIVIL, ZESTRIL) 10 MG tablet Take 2 tablets (20 mg) by mouth daily.    magnesium oxide (MAG-OX) 400 MG tablet Take 1 tablet by mouth daily    melatonin (GNP MELATONIN MAXIMUM STRENGTH) 5 MG tablet Take 2 tablets (10 mg) by mouth at bedtime.    Multiple Vitamin (MULTIVITAMIN) TABS tablet Take 1 tablet by mouth daily.    naloxone (KLOXXADO) 8 mg/0.1 mL nasal spray Call 911! Tilt head and spray intranasally into one nostril as needed for respiratory depression. If patient does not respond or responds and then relapses, repeat using a new nasal spray every 3 minutes until emergency medical assistance  arrives.    NEEDLE, DISP, 25 G 25G X   1" MISC Use to inject testosterone    NIFEdipine (ADALAT CC) 30 MG Controlled-Release tablet Take 1 tablet (30 mg) by mouth nightly.    ondansetron (ZOFRAN) 8 MG tablet Take 1 tablet (8 mg) by mouth every 8 hours as needed for Nausea/Vomiting.    oxyCODONE (ROXICODONE) 10 MG tablet Take 1 tab every 4 hours as needed for moderate pain and 2 tabs every 4 hours as needed for severe pain. Max 10 tabs per day, 28 day supply    phenazopyridine (PYRIDIUM) 100 MG tablet Take 1 tablet (100 mg) by mouth 3 times daily.    polyethylene glycol (GLYCOLAX) 17 GM/SCOOP powder Mix 17 grams in 4-8 oz of liquide and drink by mouth daily as needed (Constipation).    pravastatin (PRAVACHOL) 40 MG tablet Take 1 tablet (40 mg) by mouth every evening.    senna (SENOKOT) 8.6 MG tablet Take 1 tablet (8.6 mg) by mouth daily.    sirolimus (RAPAMUNE) 1 MG tablet Take 2 tablets (2 mg) by mouth every morning.    SYRINGE-NEEDLE, DISP, 3 ML (B-D 3CC LUER-LOK SYR 25GX1") 25G X 1" 3 ML MISC Use as directed to inject testosterone    SYRINGE-NEEDLE, DISP, 3 ML 18G X 1-1/2" 3 ML MISC Use to draw up testosterone    tacrolimus (ENVARSUS XR) 1 MG tablet STOP TAKING since 11/02/21 - remaining on chart for dose adjustments, titratable med.    tacrolimus (ENVARSUS XR) 4 MG tablet Take 1 tablet (4 mg) by mouth every morning.    tamsulosin (FLOMAX) 0.4 MG capsule Take 1 capsule (0.4 mg) by mouth daily.    tamsulosin (FLOMAX) 0.4 MG capsule Take 1 capsule (0.4 mg) by mouth daily.    testosterone cypionate (DEPO-TESTOSTERONE) 200 MG/ML SOLN Inject 1 ml into the muscle every 14 days    traZODone (DESYREL) 50 MG tablet Take 1 tablet (50 mg) by mouth nightly.     Current Facility-Administered Medications   Medication    diphenhydrAMINE (BENADRYL) injection 50 mg    diphenhydrAMINE (BENADRYL) tablet 50 mg     Immunization History   Administered Date(s) Administered    COVID-19 (Moderna) Low Dose Red Cap >= 18 Years 04/02/2020     COVID-19 (Moderna) Red Cap >= 12 Years 05/10/2019, 06/09/2019, 10/08/2019    Hep-A/Hep-B; Twinrix, Adult 05/03/2020    Influenza Vaccine (High Dose) Quadrivalent >=65 Years 12/24/2019    Influenza Vaccine (Unspecified) 10/21/2016    Influenza Vaccine >=6 Months 01/03/2010, 01/03/2011, 02/29/2012, 11/26/2013, 11/09/2017, 11/25/2018    Pneumococcal 13 Vaccine (PREVNAR-13) 12/24/2019    Pneumococcal 23 Vaccine (PNEUMOVAX-23) 01/03/2013, 05/03/2020    Tdap 02/21/2011   Deferred Date(s) Deferred    Pneumococcal 23 Vaccine (PNEUMOVAX-23) 04/24/2019     Physical Exam:  BP 102/69 (BP Location: Right arm, BP Patient Position: Sitting, BP cuff size: Large)   Pulse 98   Temp 98.5 F (36.9 C) (Temporal)   Resp 16   Ht 5' 10" (1.778 m)   Wt 96.2 kg (212 lb)   SpO2 97%   BMI 30.42 kg/m      General Appearance: ***alert, no distress, pleasant affect, cooperative.  Heart:  JVD ***, PMI ***, normal rate and regular rhythm, no murmurs, clicks, or gallops. ***  Lungs: ***clear to auscultation and percussion. No rales, rhonchi, or wheezes noted. No chest deformities noted.  Abdomen: ***BS normal.  Abdomen soft, non-tender.  No masses or organomegaly.  Extremities:  ***no cyanosis, clubbing, or edema. Has 2+ peripheral pulses.        Lab Data:  Lab Results   Component Value Date    BUN 26 (H) 11/02/2021    CREAT 1.98 (H) 11/02/2021    CL 99 11/02/2021    NA 140 11/02/2021    K 4.4 11/02/2021    CA 9.2 11/02/2021    TBILI 0.47 11/02/2021    ALB 4.1 11/02/2021    TP 7.1 11/02/2021    AST 22 11/02/2021    ALK 76 11/02/2021    BICARB 29 11/02/2021    ALT 25 11/02/2021    GLU 126 (H) 11/02/2021     Lab Results   Component Value Date    WBC 7.9 11/02/2021    RBC 5.50 11/02/2021    HGB 15.2 11/02/2021    HCT 46.5 11/02/2021    MCV 84.5 11/02/2021    MCHC 32.7 11/02/2021    RDW 12.3 11/02/2021    PLT 162 11/02/2021    MPV 11.6 11/02/2021     Lab Results   Component Value Date    A1C 5.7 04/01/2021     Lab Results   Component Value Date     TSH 1.63 04/01/2021     Lab Results   Component Value Date    CHOL 105 04/01/2021    HDL 38 04/01/2021    LDLCALC 43 04/01/2021    TRIG 121 04/01/2021     Lab Results   Component Value Date    SIROT 11.5 11/02/2021     Lab Results   Component Value Date    FKTR 6.5 11/02/2021     No results found for: CSATR  Lab Results   Component Value Date    CMVPL Not Detected 01/21/2021     Lab Results   Component Value Date    DSA ABSENT 11/02/2021       Prior Cardiovascular Studies:   Lab Results   Component Value Date    LV Ejection Fraction 59 04/28/2021          Echo 04/28/21  Summary:   1. The left ventricular size is normal. The left ventricular systolic function is normal.   2. No left ventricular hypertrophy.   3. Normal pattern of left ventricular diastolic filling.   4. EF=59%.   5. Compared to prior study EF now 59%, was 69% 05/21/20.     LHC/IVUS 04/26/21  CONCLUSION:                                                                   1. 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                              2. No angiographic evidence of coronary artery disease.                      3. Intimal thickness noted in LAD/LM up to 0.5 mm (Stable to slightly       worse compare to 2022).                                                         4. Non significant FFR at apical LAD.                                        5. Left ventricular end diastolic pressure appears normal.        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    Assessment/Plan:  # Hematuria  # Dysuria  # Chronic pain  Assessment: We had a long frank discussion about patient's chronic pain issues and the heart transplant team's role in this. I discussed with him that when I initially agreed to cover his chronic opiate prescription, this was the assumption that he would  have a provider versed in chronic pain after 3-4 months, but we are at 6 months and has unable to find one. Additionally, I had not put him on a pain contract at that time, but he recently used more opiates without asking and I informed him this was not appropriate, but because he had not established guidelines I was not going to stop at this time. However, going forward until he can establish with a pain physician, we will set up a pain contract and he will need to follow through like a usual pain clinic with us with goal of provider in 3-4 months or I may start tapering. I will augment adjuvant agents additionally for now and we can continue to work on this.  Plan:  -pain contract signed  -urine tox monthly  -clinic follow up month  -oxycodone 10 mg tablets PO, 1 tab every 4 hours moderate pain, 2 tabs every 4 hours for severe pain, no more than 10 tablets a day, total 280 per 28 days.   -diclofenac cream for joint pain  -lidocaine patch for back pain  -trial of pyridium  -increase gaba at night  -siro change as below  -cymbalta as below    # End-stage heart failure s/p orthotopic heart transplant  # Chronic Immunosuppression/Immunomodulation  Assessment: While we thought continuing sirolimus would help prevent recurrent scar tissue from prostate procedure, it may be exacerbating factors now with delayed wound healing. Will try mmf for 1 month.  Plan:   - continue envarsus 6 mg daily, goal trough 4-8  - HOLD sirolimus 3 mg daily, goal trough 4-8 for at least 1 month  - start mmf 1000 mg bid for one month to allow healing  - Continue to monitor for renal toxicities, infection risk and malignancy risk  - continue pravastatin 40 mg daily  - continue aspirin 81 mg daily    # Hypertension  Assessment: controlled  Plan:  -continue lisinopril 20 mg daily  -resume hctz  -nifedipine 30 mg daily    # Dyslipidemia  -continue pravastatin 40 mg daily    # Depression  Assessment: improved mood  Plan:  -increase cymbalta to 120  mg daily     RTC in 1 month       Nicholas W Wettersten, MD  Advanced Heart Failure, Mechanical Circulatory Support, Transplant  Pgr: [phone]

## 2013-06-24 ENCOUNTER — Telehealth: Payer: Self-pay | Admitting: *Deleted

## 2013-06-24 NOTE — Telephone Encounter (Signed)
Message copied by Corinna Capra on Tue Jun 24, 2013  8:47 AM ------      Message from: Coral Spikes      Created: Tue Jun 24, 2013  8:19 AM       Please inform patient that his creatinine has improved.             Thanks            [employer] DO ------

## 2013-06-24 NOTE — Progress Notes (Signed)
Subjective:     Patient ID: Terry Martinez, male   DOB: 1940/04/30, 73 y.o.   MRN: [id number]  HPI patient states that the toe is doing much better and he is very happy that it's not draining   Review of Systems     Objective:   Physical Exam Neurovascular status unchanged with well-healing area plantar aspect second toe right with minimal subcutaneous exposure    Assessment:     Minimal ulceration right hallux that is doing well with possibility that the big toe will be okay    Plan:     Instructed again ultimately this may breakdown but at this point we are very hopeful. I did debris the area and applied Iodosorb with thick dressing to keep pressure off and advised on keeping pressure off of the next several weeks. If any issues should occur she'll reappoint immediately to [place] or the emergency room if he turns red or starts draining

## 2013-06-24 NOTE — Telephone Encounter (Signed)
Left message on [email or web address]

## 2013-07-07 DIAGNOSIS — L97509 Non-pressure chronic ulcer of other part of unspecified foot with unspecified severity: Secondary | ICD-10-CM

## 2013-07-21 ENCOUNTER — Encounter: Payer: Self-pay | Admitting: Podiatry

## 2013-07-21 ENCOUNTER — Telehealth: Payer: Self-pay | Admitting: *Deleted

## 2013-07-21 ENCOUNTER — Ambulatory Visit: Payer: Medicare Other | Admitting: Podiatry

## 2013-07-21 ENCOUNTER — Ambulatory Visit (INDEPENDENT_AMBULATORY_CARE_PROVIDER_SITE_OTHER): Payer: Medicare Other | Admitting: *Deleted

## 2013-07-21 VITALS — BP 120/76 | HR 62 | Resp 18

## 2013-07-21 DIAGNOSIS — I4891 Unspecified atrial fibrillation: Secondary | ICD-10-CM

## 2013-07-21 DIAGNOSIS — L97509 Non-pressure chronic ulcer of other part of unspecified foot with unspecified severity: Secondary | ICD-10-CM

## 2013-07-21 LAB — POCT INR: INR: 2.6

## 2013-07-21 NOTE — Telephone Encounter (Signed)
Patient came in to clinic to day requesting a letter. Dr. Lacinda Axon would need to write a letter to Dr. Georgina Quint which is patient's surgeon. This letter needs to state that Dr. 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 Patient would like a call back from PCP to get into more detail if needed

## 2013-07-22 NOTE — Telephone Encounter (Signed)
Terry Martinez,   I'm in the hospital all week and I'm not in clinic. Could you call and clarify what he wants regarding this letter as this doesn't make sense to me.

## 2013-07-23 NOTE — Progress Notes (Signed)
Subjective:     Patient ID: Terry Martinez, male   DOB: [birth year], 73 y.o.   MRN: [id number]  HPI was bleeding last night on my right big toe in I'm scared that the ulcer might be opening to   Review of Systems     Objective:   Physical Exam Neuro vascular status intact with no health history changes noted and crusted area on the distal portion of the right hallux plantar secondary to possible trauma or small ulceration. No increased erythema edema or drainage    Assessment:     Doing well from this her right with slight irritation in the area and possible trauma    Plan:     Clean the area up small amount of subcutaneous exposure that superficial which I flushed and then applied Iodosorb was sterile dressing. Continue home Iodosorb soaks and padding and reappoint for regular visit or earlier if any issues should occur

## 2013-07-30 ENCOUNTER — Telehealth: Payer: Self-pay | Admitting: *Deleted

## 2013-07-30 ENCOUNTER — Encounter: Payer: Self-pay | Admitting: Family Medicine

## 2013-07-30 NOTE — Telephone Encounter (Signed)
Spoke to both patient and sister.I asked them both if he has ever been seen by a GI doctor because of his age he should have and they stated more that once stated he has never seen a GI doctor. At that point I spoke to patients sister and explained the protocol , that referral has been ordered since the 4th of may.I gave her all the Gi sites and numbers to call and also mailed patient a colon cancer screening information sheet with again all number of Gi doctors on the back.she voiced understanding and appreciation.Blyss Lugar, Lewie Loron

## 2013-07-30 NOTE — Progress Notes (Unsigned)
Patient is waiting to be referred for his colonoscopy.  He was seen on 05/04 and says he has still not heard anything concerning this.  Please call his sister concerning this.  Her number is [phone].

## 2013-07-31 ENCOUNTER — Encounter: Payer: Self-pay | Admitting: Gastroenterology

## 2013-08-11 ENCOUNTER — Ambulatory Visit (INDEPENDENT_AMBULATORY_CARE_PROVIDER_SITE_OTHER): Payer: Medicare Other | Admitting: Family Medicine

## 2013-08-11 ENCOUNTER — Encounter: Payer: Self-pay | Admitting: Family Medicine

## 2013-08-11 VITALS — BP 127/70 | HR 59 | Temp 97.4°F | Wt 326.0 lb

## 2013-08-11 DIAGNOSIS — N5089 Other specified disorders of the male genital organs: Secondary | ICD-10-CM

## 2013-08-11 NOTE — Progress Notes (Signed)
Terry Reddish, MD Phone: [phone]  Subjective:   Terry Martinez is a 73 y.o. year old very pleasant male patient who presents with the following:  Scrotal Swelling Noted swelling in groin specifically an enlarged scrotum since Saturday. Happened similar to this 3-4 years ago but was not as large as current and resoled within a few days. Patient states swelling was worse yesterday but improved some today. Does not seem to get better or worse with anything although feels slightly more swollen if coughs. He has had normal bowel movements and denies any testicular or abdominal pain.  ROS- no redness of swelling in the groin. No fever/chills. No urinary symptoms.   Past Medical History- PAD, HTN, a fib, HLD, hx tobacco abuse, CAD, chronic back pain   Medications- reviewed and updated Current Outpatient Prescriptions  Medication Sig Dispense Refill  . allopurinol (ZYLOPRIM) 300 MG tablet Take 1 tablet (300 mg total) by mouth every morning.  90 tablet  3  . enalapril (VASOTEC) 10 MG tablet Take 10 mg by mouth daily.      . ferrous fumarate (HEMOCYTE - 106 MG FE) 325 (106 FE) MG TABS tablet Take 1 tablet by mouth every evening.      . furosemide (LASIX) 40 MG tablet Take 40 mg by mouth daily.      . metoprolol tartrate (LOPRESSOR) 25 MG tablet Take 25 mg by mouth 2 (two) times daily.      . potassium chloride SA (K-DUR,KLOR-CON) 20 MEQ tablet Take 20 mEq by mouth daily.      [name] warfarin (COUMADIN) 5 MG tablet Take 1 tablet (5 mg total) by mouth daily.  90 tablet  3  . acetaminophen (TYLENOL) 500 MG tablet Take 500 mg by mouth every 6 (six) hours as needed for pain.      [name] albuterol (PROVENTIL HFA;VENTOLIN HFA) 108 (90 BASE) MCG/ACT inhaler Inhale 2 puffs into the lungs every 6 (six) hours as needed for wheezing or shortness of breath.  1 Inhaler  2  . EPINEPHrine (EPI-PEN) 0.3 mg/0.3 mL SOAJ injection Inject 0.3 mLs (0.3 mg total) into the muscle once.  1 Device  1  . lovastatin (MEVACOR) 20  MG tablet Take 1 tablet (20 mg total) by mouth at bedtime.  90 tablet  2  . Multiple Vitamins-Minerals (MENS MULTIVITAMIN PLUS PO) Take 1 tablet by mouth daily.      . nitroGLYCERIN (NITROSTAT) 0.4 MG SL tablet Place 1 tablet (0.4 mg total) under the tongue every 5 (five) minutes as needed for chest pain.  90 tablet  3  . oxyCODONE-acetaminophen (PERCOCET) 10-325 MG per tablet Take 1 tablet by mouth every 8 (eight) hours as needed for pain.  90 tablet  0  . polyethylene glycol powder (GLYCOLAX/MIRALAX) powder Take 17 g by mouth 2 (two) times daily as needed.  527 g  3  . zoster vaccine live, PF, (ZOSTAVAX) 03474 UNT/0.65ML injection Inject 19,400 Units into the skin once.  1 each  0   No current facility-administered medications for this visit.    Objective: BP 127/70  Pulse 59  Temp(Src) 97.4 F (36.3 C) (Oral)  Wt 326 lb (147.873 kg) Gen: NAD, resting comfortably on table CV: RRR no murmurs rubs or gallops Lungs: CTAB no crackles, wheeze, rhonchi Abdomen: soft/nontender/nondistended/normal bowel sounds. No rebound or guarding.  Ext: 1+ pitting edema (unchanged from previous) Skin: warm, dry, does have mild erythema in groin along creases but not in testicle Male genitalia: penis: no lesions or discharge,  uncircumcised. testes: no masses or tenderness obviously but difficult to palpate due to edema. Both sides of scrotum are enlarged and have balloon like texture. Suspect hydrocele on right due to transillumination. On left, may have hernia as feel bulge in inguinal canal but overall feel of left scrotum is still balloon like. There is no transillumination on the left though making me lean towards hydrocele  Assessment/Plan:  Scrotal Swelling Initially I thought patient likely with hernia but with elements that feel like hydrocele and right side transilluminates. Will get ultrasound for further information to lead [place] towards urological or surgical consult. Patient is very frustrated by  size as he tends to sit on the sack at times which causes discomfort. He would definitely want surgical correction if possible. Son who was with him at visit today had to have hydrocele repaired in adult life.   Orders Placed This Encounter  Procedures  . US Scrotum    Standing Status: Future     Number of Occurrences:      Standing Expiration Date: 10/12/2014    Order Specific Question:  Reason for Exam (SYMPTOM  OR DIAGNOSIS REQUIRED)    Answer:  scrotal swelling; transiluminates on right but not on left with bulge in left. Want to evaluate for hydrocele although suspect may have hernia on left    Order Specific Question:  Preferred imaging location?    Answer:  [facility]

## 2013-08-11 NOTE — Patient Instructions (Signed)
Let's get an ultrasound to see if this is a urological or general surgery issue. This could be hydrocele or hernia or both.

## 2013-08-12 ENCOUNTER — Telehealth: Payer: Self-pay | Admitting: Family Medicine

## 2013-08-12 ENCOUNTER — Ambulatory Visit (HOSPITAL_COMMUNITY)
Admission: RE | Admit: 2013-08-12 | Discharge: 2013-08-12 | Disposition: A | Discharge status: Home / Self Care | Payer: Medicare Other | Source: Ambulatory Visit | Attending: Family Medicine | Admitting: Family Medicine

## 2013-08-12 DIAGNOSIS — I861 Scrotal varices: Secondary | ICD-10-CM

## 2013-08-12 DIAGNOSIS — N508 Other specified disorders of male genital organs: Secondary | ICD-10-CM | POA: Insufficient documentation

## 2013-08-12 DIAGNOSIS — N501 Vascular disorders of male genital organs: Secondary | ICD-10-CM | POA: Diagnosis not present

## 2013-08-12 DIAGNOSIS — N5089 Other specified disorders of the male genital organs: Secondary | ICD-10-CM

## 2013-08-12 DIAGNOSIS — N433 Hydrocele, unspecified: Secondary | ICD-10-CM

## 2013-08-12 NOTE — Telephone Encounter (Signed)
Spoke with patient about results. He will need referral to urology as he would like to consider surgical excision of hydrocele as well as discuss varicocele and cysts. Patient does states today that he does have pain with certain movements (I had understood that he only had pain if he sat on scrotum yesterday) so this would definitely be symptomatic hydrocele.   I told him I would ask Christen Bame, CMA to process request and hope to bypass referral que. Would ask that copy of ultrasound be sent to urology (although they can access through epic).

## 2013-08-12 NOTE — Telephone Encounter (Signed)
Spoke with [facility] triage nurse (robin),  She request that I send over notes and they will contact pt to schedule.  Informed pt that if he has not heard from them by tomorrow noon to please give me a call back.  Pt agreeable. [place], Terry Martinez

## 2013-08-13 ENCOUNTER — Ambulatory Visit (INDEPENDENT_AMBULATORY_CARE_PROVIDER_SITE_OTHER): Payer: Medicare Other | Admitting: Gastroenterology

## 2013-08-13 ENCOUNTER — Telehealth: Payer: Self-pay

## 2013-08-13 ENCOUNTER — Encounter: Payer: Self-pay | Admitting: Gastroenterology

## 2013-08-13 VITALS — BP 128/78 | HR 66 | Ht 77.0 in | Wt 325.6 lb

## 2013-08-13 DIAGNOSIS — K439 Ventral hernia without obstruction or gangrene: Secondary | ICD-10-CM

## 2013-08-13 DIAGNOSIS — Z1211 Encounter for screening for malignant neoplasm of colon: Secondary | ICD-10-CM

## 2013-08-13 DIAGNOSIS — Z7901 Long term (current) use of anticoagulants: Secondary | ICD-10-CM

## 2013-08-13 DIAGNOSIS — I2510 Atherosclerotic heart disease of native coronary artery without angina pectoris: Secondary | ICD-10-CM

## 2013-08-13 MED ORDER — PEG-KCL-NACL-NASULF-NA ASC-C 100 G PO SOLR: 1.0000 | Freq: Once | ORAL | Status: DC

## 2013-08-13 NOTE — Telephone Encounter (Signed)
  08/13/2013   RE: Terry Martinez DOB: [birth year] MRN: [id number]   Dear Dr. Lacinda Axon and Dr. Harrington Challenger,    We have scheduled the above patient for an endoscopic procedure. Our records show that he is on anticoagulation therapy.   Please advise as to how long the patient may come off his therapy of coumadin prior to the procedure, which is scheduled for 09/19/13.  Please fax back/ or route your answer to [place] at [phone].   Sincerely,    Marlon Pel, CMA

## 2013-08-13 NOTE — Patient Instructions (Signed)
You have been scheduled for a colonoscopy. Please follow written instructions given to you at your visit today.  Please pick up your prep kit at the pharmacy within the next 1-3 days. If you use inhalers (even only as needed), please bring them with you on the day of your procedure. Your physician has requested that you go to www.startemmi.com and enter the access code given to you at your visit today. This web site gives a general overview about your procedure. However, you should still follow specific instructions given to you by our office regarding your preparation for the procedure.  You will be contaced by our office prior to your procedure for directions on holding your Coumadin/Warfarin.  If you do not hear from our office 1 week prior to your scheduled procedure, please call [phone] to discuss.  Thank you for choosing me and [facility].  Pricilla Riffle. Dagoberto Ligas., MD., Marval Regal

## 2013-08-13 NOTE — Telephone Encounter (Signed)
OK to come of coumadin few days prior  Check INR. Resume after.

## 2013-08-13 NOTE — Progress Notes (Signed)
    History of Present Illness: This is a 73 year old white male accompanied by his brother-in-law. He is maintained on Coumadin for atrial fibrillation. He has mild constipation controlled with MiraLax and a ventral hernia. No other gastrointestinal complaints. 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 Denies weight loss, abdominal pain, diarrhea, change in stool caliber, melena, hematochezia, nausea, vomiting, dysphagia, reflux symptoms, chest pain.  Review of Systems: Pertinent positive and negative review of systems were noted in the above HPI section. All other review of systems were otherwise negative.  Current Medications, Allergies, Past Medical History, Past Surgical History, Family History and Social History were reviewed in [employer] record.  Physical Exam: General: Well developed , well nourished, no acute distress Head: Normocephalic and atraumatic Eyes:  sclerae anicteric, EOMI Ears: Normal auditory acuity Mouth: No deformity or lesions Neck: Supple, no masses or thyromegaly Lungs: Clear throughout to auscultation Heart: Regular rate and rhythm; no murmurs, rubs or bruits Abdomen: Soft, non tender and non distended. No masses, hepatosplenomegaly noted. Large ventral hernia. Long midline abdominal incision. Normal Bowel sounds Rectal: Deferred to colonoscopy Musculoskeletal: Symmetrical with no gross deformities  Skin: No lesions on visible extremities Pulses:  Normal pulses noted Extremities: No clubbing, cyanosis or deformities noted. 1-2+ pedal and ankle edema. Chronic venous stasis changes. Neurological: Alert oriented x 4, grossly nonfocal Cervical Nodes:  No significant cervical adenopathy Inguinal Nodes: No significant inguinal adenopathy Psychological:  Alert and cooperative. Normal mood and affect  Assessment and Recommendations:  1. Colorectal cancer screening, average  risk. The risks, benefits, and alternatives to colonoscopy with possible biopsy and possible polypectomy were discussed with the patient and they consent to proceed.   2. Ventral hernia. Consider surgical evaluation following colonoscopy.  3. 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 GU evaluation schedule for tomorrow.   4. Atrial fibrillation. Chronic warfarin anticoagulation. The risks, benefits and alternatives to a 5 day hold of warfarin were discussed with the patient he consents to proceed. Obtain clearance from his cardiologist.

## 2013-08-14 NOTE — Telephone Encounter (Signed)
Old for 4 days  Check INR

## 2013-08-14 NOTE — Telephone Encounter (Signed)
Can you please clarify how long you want patient to hold coumadin?

## 2013-08-15 NOTE — Telephone Encounter (Signed)
Patient notified to hold coumadin 4 days before his procedure and to have his INR checked with Cardiology after procedure. Pt states he already has a coumadin clinic appt and will discuss at that appt.

## 2013-08-18 ENCOUNTER — Ambulatory Visit (INDEPENDENT_AMBULATORY_CARE_PROVIDER_SITE_OTHER): Payer: Medicare Other | Admitting: Podiatry

## 2013-08-18 ENCOUNTER — Encounter: Payer: Self-pay | Admitting: Podiatry

## 2013-08-18 ENCOUNTER — Ambulatory Visit (INDEPENDENT_AMBULATORY_CARE_PROVIDER_SITE_OTHER): Payer: Medicare Other | Admitting: *Deleted

## 2013-08-18 VITALS — BP 145/78 | HR 75 | Resp 16

## 2013-08-18 DIAGNOSIS — L97509 Non-pressure chronic ulcer of other part of unspecified foot with unspecified severity: Secondary | ICD-10-CM

## 2013-08-18 DIAGNOSIS — I4891 Unspecified atrial fibrillation: Secondary | ICD-10-CM

## 2013-08-18 DIAGNOSIS — B351 Tinea unguium: Secondary | ICD-10-CM

## 2013-08-18 DIAGNOSIS — M79609 Pain in unspecified limb: Secondary | ICD-10-CM

## 2013-08-18 DIAGNOSIS — M79673 Pain in unspecified foot: Secondary | ICD-10-CM

## 2013-08-18 LAB — POCT INR: INR: 3.8

## 2013-08-18 NOTE — Progress Notes (Signed)
   Subjective:    Patient ID: Terry Martinez, male    DOB: 1940-03-21, 73 y.o.   MRN: [id number]  HPI  Pt is here for routine ulcer debride of right great toe  Review of Systems     Objective:   Physical Exam        Assessment & Plan:

## 2013-08-19 ENCOUNTER — Ambulatory Visit: Payer: Medicare Other

## 2013-08-19 NOTE — Progress Notes (Signed)
Subjective:     Patient ID: Terry Martinez, male   DOB: [birth year], 73 y.o.   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 It is healing well with minimal drainage and crusted tissue and no odor noted    Assessment:     Healing ulceration of the right big toe    Plan:     With sharp instrumentation debris did lesion and flushed and applied Iodosorb with to pad and instructed to continue this and reappoint in 1 month earlier if any issues should occur

## 2013-08-21 ENCOUNTER — Telehealth: Payer: Self-pay | Admitting: Family Medicine

## 2013-08-21 NOTE — Telephone Encounter (Signed)
Needs referral to eye dr. Maudry Mayhew on [facility]

## 2013-08-25 ENCOUNTER — Ambulatory Visit (INDEPENDENT_AMBULATORY_CARE_PROVIDER_SITE_OTHER): Payer: Medicare Other | Admitting: *Deleted

## 2013-08-25 DIAGNOSIS — L97509 Non-pressure chronic ulcer of other part of unspecified foot with unspecified severity: Secondary | ICD-10-CM

## 2013-08-25 DIAGNOSIS — I4891 Unspecified atrial fibrillation: Secondary | ICD-10-CM

## 2013-08-25 LAB — POCT INR: INR: 2

## 2013-08-26 ENCOUNTER — Ambulatory Visit: Payer: Medicare Other

## 2013-08-27 ENCOUNTER — Encounter: Payer: Self-pay | Admitting: Family Medicine

## 2013-08-27 ENCOUNTER — Ambulatory Visit (INDEPENDENT_AMBULATORY_CARE_PROVIDER_SITE_OTHER): Payer: Medicare Other | Admitting: Family Medicine

## 2013-08-27 VITALS — BP 143/49 | HR 66 | Temp 98.6°F | Ht 72.5 in | Wt 320.0 lb

## 2013-08-27 DIAGNOSIS — I739 Peripheral vascular disease, unspecified: Secondary | ICD-10-CM

## 2013-08-27 DIAGNOSIS — H539 Unspecified visual disturbance: Secondary | ICD-10-CM | POA: Insufficient documentation

## 2013-08-27 DIAGNOSIS — L821 Other seborrheic keratosis: Secondary | ICD-10-CM

## 2013-08-27 DIAGNOSIS — Z85828 Personal history of other malignant neoplasm of skin: Secondary | ICD-10-CM | POA: Insufficient documentation

## 2013-08-27 NOTE — Assessment & Plan Note (Signed)
Cryotherapy performed today. 

## 2013-08-27 NOTE — Assessment & Plan Note (Signed)
Patient received notice from Vascular surgeon about need for follow up. He states that referral is needed.  Will place referral today.

## 2013-08-27 NOTE — Progress Notes (Signed)
   Subjective:    Patient ID: Terry Martinez, male    DOB: 1941-02-12, 73 y.o.   MRN: [id number]  HPI 73 year old male with a complex PMH including atrial fib, CAD, HTN, HLD, and PAD presents for evaluation of a mole and also needs referrals.  1) Mole - Patient reports that he has a "mole" that is bothersome.  He states that it is irritated and "feels like a thorn" under his skin. - He states that it is located on his right upper chest. - It has been present x 2 months and has been getting larger.  No reported change in coloration.  2) Referrals - Patient in need of Optometry referral for examination and new pair of glasses. - He is also due to see his vascular surgeon and needs a referral to return. - Additionally, he would like to see a dermatologist who is closer to [place] (he has hx of basal cell carcinoma x 2).  Review of Systems Per HPI with the following additions:    Objective:   Physical Exam Filed Vitals:   08/27/13 0836  BP: 143/49  Pulse: 66  Temp: 98.6 F (37 C)   Exam: General: well appearing obese male in NAD. Skin: Hyperkeratotic raised ~ 3 mm skin lesion located on the right upper chest below the clavicle. Mildly erythema noted at base.  Clinical appearance consistent with Seborrheic keratosis.   Assessment & Plan:  See Problem List

## 2013-08-27 NOTE — Assessment & Plan Note (Signed)
Patient in need of new glasses. Referral to optometry today.

## 2013-08-27 NOTE — Assessment & Plan Note (Signed)
Patient with prior visit to Derm with Mohs surgery for basal cell carcinoma x 2.  He desires a change in dermatology (to be closer to home).  Will place referral.

## 2013-09-02 ENCOUNTER — Encounter: Payer: Self-pay | Admitting: Family Medicine

## 2013-09-02 NOTE — Progress Notes (Signed)
Patients wife stopped by and says husbands needs a referral to Dr. Mollie Germany in [place].  His number is [phone].  He has an appointment already scheduled there for next Thursday.  He is also needing a referral for his eyeglasses and wife needs to ask a question concerning that as well.  Please call her at [phone].

## 2013-09-03 ENCOUNTER — Telehealth: Payer: Self-pay | Admitting: *Deleted

## 2013-09-03 NOTE — Telephone Encounter (Signed)
Spoke with [facility] @ Dr. Mollie Germany in [place]. She stated that patient is already established there and does not need a referral, she was not in clinic yesterday so she does not know who would tell his wife that.

## 2013-09-03 NOTE — Telephone Encounter (Signed)
Wife called back ans was given the information about Dr. Georgina Quint, but she also needs a referral for him to see a eye doctor. jw

## 2013-09-04 NOTE — Telephone Encounter (Signed)
Please advise.Thank you.Bastien Strawser S  

## 2013-09-08 ENCOUNTER — Ambulatory Visit (INDEPENDENT_AMBULATORY_CARE_PROVIDER_SITE_OTHER): Payer: Medicare Other | Admitting: *Deleted

## 2013-09-08 DIAGNOSIS — I4891 Unspecified atrial fibrillation: Secondary | ICD-10-CM

## 2013-09-08 LAB — POCT INR: INR: 2.7

## 2013-09-09 ENCOUNTER — Ambulatory Visit: Payer: Medicare Other

## 2013-09-15 ENCOUNTER — Ambulatory Visit: Payer: Medicare Other | Admitting: Podiatry

## 2013-09-15 ENCOUNTER — Encounter: Payer: Self-pay | Admitting: Podiatry

## 2013-09-15 VITALS — BP 142/84 | HR 75 | Resp 17

## 2013-09-15 DIAGNOSIS — L97509 Non-pressure chronic ulcer of other part of unspecified foot with unspecified severity: Secondary | ICD-10-CM

## 2013-09-15 NOTE — Progress Notes (Signed)
   Subjective:    Patient ID: Terry Martinez, male    DOB: 1940/12/31, 73 y.o.   MRN: [id number]  HPI Pt presents with ulcer recheck on right great toe.   Review of Systems     Objective:   Physical Exam        Assessment & Plan:

## 2013-09-15 NOTE — Progress Notes (Signed)
Subjective:     Patient ID: Terry Martinez, male   DOB: 1940-04-12, 73 y.o.   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 Reappoint her recheck in 4 weeks earlier if any issues should occur

## 2013-09-16 ENCOUNTER — Telehealth: Payer: Self-pay | Admitting: Family Medicine

## 2013-09-16 NOTE — Telephone Encounter (Signed)
Sister called and would like a referral [facility] for [facility]. He is having surgery next Wednesday and will be in the hospital for at least 2 days, but once he gets back here he will need to be seen at the [facility]. Please call her when this is complete. j w

## 2013-09-16 NOTE — Telephone Encounter (Signed)
Please advise. Terry Martinez S  

## 2013-09-16 NOTE — Telephone Encounter (Signed)
Referral to who/what?

## 2013-09-19 ENCOUNTER — Encounter: Payer: Self-pay | Admitting: Gastroenterology

## 2013-09-19 ENCOUNTER — Ambulatory Visit (AMBULATORY_SURGERY_CENTER): Payer: Medicare Other | Admitting: Gastroenterology

## 2013-09-19 VITALS — BP 137/72 | HR 63 | Temp 95.9°F | Resp 17 | Wt 325.0 lb

## 2013-09-19 DIAGNOSIS — D126 Benign neoplasm of colon, unspecified: Secondary | ICD-10-CM

## 2013-09-19 DIAGNOSIS — Z1211 Encounter for screening for malignant neoplasm of colon: Secondary | ICD-10-CM

## 2013-09-19 DIAGNOSIS — D128 Benign neoplasm of rectum: Secondary | ICD-10-CM

## 2013-09-19 DIAGNOSIS — D129 Benign neoplasm of anus and anal canal: Secondary | ICD-10-CM

## 2013-09-19 MED ORDER — SODIUM CHLORIDE 0.9 % IV SOLN: 500.0000 mL | INTRAVENOUS | Status: DC

## 2013-09-19 NOTE — Progress Notes (Signed)
Report to PACU, RN, vss, BBS= Clear.  

## 2013-09-19 NOTE — Patient Instructions (Signed)
YOU HAD AN ENDOSCOPIC PROCEDURE TODAY AT THE [facility]: Refer to the procedure report that was given to you for any specific questions about what was found during the examination.  If the procedure report does not answer your questions, please call your gastroenterologist to clarify.  If you requested that your care partner not be given the details of your procedure findings, then the procedure report has been included in a sealed envelope for you to review at your convenience later.  YOU SHOULD EXPECT: Some feelings of bloating in the abdomen. Passage of more gas than usual.  Walking can help get rid of the air that was put into your GI tract during the procedure and reduce the bloating. If you had a lower endoscopy (such as a colonoscopy or flexible sigmoidoscopy) you may notice spotting of blood in your stool or on the toilet paper. If you underwent a bowel prep for your procedure, then you may not have a normal bowel movement for a few days.  DIET: Your first meal following the procedure should be a light meal and then it is ok to progress to your normal diet.  A half-sandwich or bowl of soup is an example of a good first meal.  Heavy or fried foods are harder to digest and may make you feel nauseous or bloated.  Likewise meals heavy in dairy and vegetables can cause extra gas to form and this can also increase the bloating.  Drink plenty of fluids but you should avoid alcoholic beverages for 24 hours.  ACTIVITY: Your care partner should take you home directly after the procedure.  You should plan to take it easy, moving slowly for the rest of the day.  You can resume normal activity the day after the procedure however you should NOT DRIVE or use heavy machinery for 24 hours (because of the sedation medicines used during the test).    SYMPTOMS TO REPORT IMMEDIATELY: A gastroenterologist can be reached at any hour.  During normal business hours, 8:30 AM to 5:00 PM Monday through Friday,  call [phone].  After hours and on weekends, please call the GI answering service at [phone] who will take a message and have the physician on call contact you.   Following lower endoscopy (colonoscopy or flexible sigmoidoscopy):  Excessive amounts of blood in the stool  Significant tenderness or worsening of abdominal pains  Swelling of the abdomen that is new, acute  Fever of 100F or higher  FOLLOW UP: If any biopsies were taken you will be contacted by phone or by letter within the next 1-3 weeks.  Call your gastroenterologist if you have not heard about the biopsies in 3 weeks.  Our staff will call the home number listed on your records the next business day following your procedure to check on you and address any questions or concerns that you may have at that time regarding the information given to you following your procedure. This is a courtesy call and so if there is no answer at the home number and we have not heard from you through the emergency physician on call, we will assume that you have returned to your regular daily activities without incident.  SIGNATURES/CONFIDENTIALITY: You and/or your care partner have signed paperwork which will be entered into your electronic medical record.  These signatures attest to the fact that that the information above on your After Visit Summary has been reviewed and is understood.  Full responsibility of the confidentiality of this   discharge information lies with you and/or your care-partner.  Recommendations Hold aspirin, aspirin products, and anti-inflammatory medication for 2 weeks.  Next colonoscopy in 3,5, or 10 years, depending on pathology results.  Resume Coumadin tomorrow and have PT/INR checked in 1 week.

## 2013-09-19 NOTE — Op Note (Signed)
[place]  [address] [employer] [address] [place], [id number]   COLONOSCOPY PROCEDURE REPORT PATIENT: Terry, Martinez  MR#: [id number] BIRTHDATE: [birth year] , 60  yrs. old GENDER: Male ENDOSCOPIST: Ladene Artist, MD, [facility] REFERRED KD:TOIZTIW Ree Kida, M.D. PROCEDURE DATE:  09/19/2013 PROCEDURE:   Colonoscopy with snare polypectomy First Screening Colonoscopy - Avg.  risk and is 50 yrs.  old or older Yes.  Prior Negative Screening - Now for repeat screening. N/A  History of Adenoma - Now for follow-up colonoscopy & has been > or = to 3 yrs.  N/A  Polyps Removed Today? Yes. ASA CLASS:   Class III INDICATIONS:average risk screening. MEDICATIONS: MAC sedation, administered by CRNA and propofol (Diprivan) 250mg  IV DESCRIPTION OF PROCEDURE:   After the risks benefits and alternatives of the procedure were thoroughly explained, informed consent was obtained.  A digital rectal exam revealed no abnormalities of the rectum.   The LB [id number]  endoscope was introduced through the anus and advanced to the cecum, which was identified by both the appendix and ileocecal valve. No adverse events experienced.   The quality of the prep was good, using MoviPrep. The photo of the retroflexed rectal view did not capture. The instrument was then slowly withdrawn as the colon was fully examined.  COLON FINDINGS: Two sessile polyps 5-7 mm were found at the cecum. A polypectomy was performed with a cold snare.  The resection was complete and the polyp tissue was completely retrieved.   A pedunculated polyp measuring 7 mm in size was found in the transverse colon.  A polypectomy was performed with a cold snare. The resection was complete and the polyp tissue was completely retrieved.   A sessile polyp measuring 6 mm in size was found in the rectum.  A polypectomy was performed with a cold snare.  The resection was complete and the polyp tissue was completely retrieved.   The colon was  otherwise normal.  There was no diverticulosis, inflammation, polyps or cancers unless previously stated.  Retroflexed views revealed small internal hemorrhoids. The time to cecum=4 minutes 21 seconds.  Withdrawal time=10 minutes 48 seconds.  The scope was withdrawn and the procedure completed. COMPLICATIONS: There were no complications.  ENDOSCOPIC IMPRESSION: 1.   Two sessile polyps measuring 5-7 mm at the cecum; polypectomy with a cold snare 2.   Pedunculated polyp measuring 7 mm in the transverse colon; polypectomy with a cold snare 3.   Sessile polyp measuring 6 mm in the rectum; polypectomy  with a cold snare 4.   Small internal hemorrhoids  RECOMMENDATIONS: 1.  Hold aspirin, aspirin products, and anti-inflammatory medication for 2 weeks. 2.  Repeat colonoscopy in 3 years if 3-4 polyp adenomatous; 5 years if 1-2 adenomatous; otherwise 10 years 3.  Resume Coumadin (warfarin) tomorrow and have your PT/INR checked within 1 week.  eSigned:  Ladene Artist, MD, [facility] 09/19/2013 2:57 PM

## 2013-09-19 NOTE — Progress Notes (Signed)
Called to room to assist during endoscopic procedure.  Patient ID and intended procedure confirmed with present staff. Received instructions for my participation in the procedure from the performing physician.  

## 2013-09-22 ENCOUNTER — Telehealth: Payer: Self-pay | Admitting: *Deleted

## 2013-09-22 NOTE — Telephone Encounter (Signed)
  Follow up Call-  Call back number 09/19/2013  Post procedure Call Back phone  # [phone]  Permission to leave phone message Yes     Patient questions:  Do you have a fever, pain , or abdominal swelling? No. Pain Score  0 *  Have you tolerated food without any problems? Yes.    Have you been able to return to your normal activities? Yes.    Do you have any questions about your discharge instructions: Diet   No. Medications  No. Follow up visit  No.  Do you have questions or concerns about your Care? No.  Actions: * If pain score is 4 or above: No action needed, pain <4.

## 2013-09-27 ENCOUNTER — Encounter: Payer: Self-pay | Admitting: Gastroenterology

## 2013-09-29 ENCOUNTER — Ambulatory Visit (INDEPENDENT_AMBULATORY_CARE_PROVIDER_SITE_OTHER): Payer: Medicare Other | Admitting: *Deleted

## 2013-09-29 DIAGNOSIS — I4891 Unspecified atrial fibrillation: Secondary | ICD-10-CM

## 2013-09-29 LAB — POCT INR: INR: 1.3

## 2013-09-29 NOTE — Telephone Encounter (Signed)
Pt had surgery in [place] to adjust the blood flow to his big toe. At that time Dr said he needed to be seen by Vascular/Vein specialist in [place] rather than coming back to  [place]. Vein/Vascular will not set up an appt until a referral is made. Referral is needed

## 2013-09-29 NOTE — Telephone Encounter (Signed)
Please advise.Thank you.Terry Martinez S  

## 2013-09-29 NOTE — Telephone Encounter (Signed)
Please call sister [name] at [phone]

## 2013-10-06 ENCOUNTER — Ambulatory Visit: Payer: Medicare Other

## 2013-10-08 ENCOUNTER — Encounter: Payer: Self-pay | Admitting: Family Medicine

## 2013-10-08 ENCOUNTER — Ambulatory Visit (INDEPENDENT_AMBULATORY_CARE_PROVIDER_SITE_OTHER): Payer: Medicare Other | Admitting: *Deleted

## 2013-10-08 DIAGNOSIS — I4891 Unspecified atrial fibrillation: Secondary | ICD-10-CM

## 2013-10-08 LAB — POCT INR: INR: 2.5

## 2013-10-08 NOTE — Progress Notes (Unsigned)
Spoke with sister and she is aware that referral has been taken care of and placed in [facility] workqueue. Jazmin Hartsell,CMA

## 2013-10-08 NOTE — Telephone Encounter (Signed)
Jazmin called [name] sister to update of referral in [facility] workqueue.Jazmin also showed me how to send referral to [facility] workqueue. this has been addressed .Hurshell Dino, Lewie Loron

## 2013-10-08 NOTE — Telephone Encounter (Signed)
Sister came into office today to inquire about referral.  Placed in vein and vascular workqueue.  They will call her and schedule this.  She is aware. Garrie Woodin,CMA

## 2013-10-08 NOTE — Progress Notes (Unsigned)
Patient is wanting to be referred to a vascular dr here instead of having to go to [place].  I gave him a ROI to fill out to get his records from there.  He wanted to be seen by [facility] before Sept 10th but there were no appts available until 09/23.  I scheduled him for then but he has appt in [place] on 09/10 but didn't want to have to make that trip again.

## 2013-10-13 ENCOUNTER — Ambulatory Visit (INDEPENDENT_AMBULATORY_CARE_PROVIDER_SITE_OTHER): Payer: Medicare Other | Admitting: Podiatry

## 2013-10-13 ENCOUNTER — Encounter: Payer: Self-pay | Admitting: Podiatry

## 2013-10-13 VITALS — BP 138/83 | HR 59 | Resp 17 | Ht 77.0 in | Wt 310.0 lb

## 2013-10-13 DIAGNOSIS — L97509 Non-pressure chronic ulcer of other part of unspecified foot with unspecified severity: Secondary | ICD-10-CM

## 2013-10-14 NOTE — Progress Notes (Signed)
Subjective:     Patient ID: Terry Martinez, male   DOB: March 02, 1940, 73 y.o.   MRN: [id number]  HPI patient points to the right big toe states it seems to be improving and I just have the stents put in my right leg and it Dr. said circulation has improved. Have this done in [place]   Review of Systems     Objective:   Physical Exam I did note increased warmth to the right foot with pulses that are palpable right and the distal breakdown of tissue continues to get smaller and measures approximately 1.5 x 1.5 mm and is superficial in its depth    Assessment:     Improving ulceration right hallux    Plan:     Debrided tissue flushed the area and applied Iodosorb with thick padding. I'm very hopeful this will continue to heal and heal completely do to the increased circulation and I advised him of this but also advised him that if any redness swelling or increased pain should occur he is to let us know immediately

## 2013-10-16 ENCOUNTER — Other Ambulatory Visit: Payer: Self-pay | Admitting: *Deleted

## 2013-10-16 DIAGNOSIS — L97909 Non-pressure chronic ulcer of unspecified part of unspecified lower leg with unspecified severity: Secondary | ICD-10-CM

## 2013-10-16 DIAGNOSIS — I739 Peripheral vascular disease, unspecified: Secondary | ICD-10-CM

## 2013-10-16 NOTE — Progress Notes (Signed)
HPI Patient is a 73 yo who I saw for the first time last Dec.  He lived in [place] and moved to [place]. He has an extensive history of CAD and PVOD.  Followed by Dr Jodi Mourning at [facility].  Hx of afib  Hx of CAD (6 stents, last in July 2014) He also has a history of extensive PVOD  Followed by Dr Georgina Quint.    Since I saw him he has done fairly well  Denies CP  Does say he gets SOB some  Wife thinks a little more.  He attrib to not being up as much walking due to sore on foot  This has healed and he is getting around more He was seen by Dr Georgina Quint early this month  Had a USN of graft.  Thought there was a problem  Had angio done  By patient's report he said every thing looked OK  Needs one stitch out.  He has appt with C Dickson to est here in [place] (9/16)  Wants to stay local  Allergies  Allergen Reactions  . Zocor [Simvastatin] Hives    Current Outpatient Prescriptions  Medication Sig Dispense Refill  . acetaminophen (TYLENOL) 500 MG tablet Take 500 mg by mouth every 6 (six) hours as needed for pain.      [name] albuterol (PROVENTIL HFA;VENTOLIN HFA) 108 (90 BASE) MCG/ACT inhaler Inhale 2 puffs into the lungs every 6 (six) hours as needed for wheezing or shortness of breath.  1 Inhaler  2  . allopurinol (ZYLOPRIM) 300 MG tablet Take 1 tablet (300 mg total) by mouth every morning.  90 tablet  3  . enalapril (VASOTEC) 10 MG tablet Take 10 mg by mouth daily.      [name] EPINEPHrine (EPI-PEN) 0.3 mg/0.3 mL SOAJ injection Inject 0.3 mLs (0.3 mg total) into the muscle once.  1 Device  1  . ferrous fumarate (HEMOCYTE - 106 MG FE) 325 (106 FE) MG TABS tablet Take 1 tablet by mouth every evening.      . furosemide (LASIX) 40 MG tablet Take 40 mg by mouth daily.      [name] lovastatin (MEVACOR) 20 MG tablet Take 1 tablet (20 mg total) by mouth at bedtime.  90 tablet  2  . metoprolol tartrate (LOPRESSOR) 25 MG tablet Take 25 mg by mouth 2 (two) times daily.      . Multiple Vitamins-Minerals (MENS  MULTIVITAMIN PLUS PO) Take 1 tablet by mouth daily.      . nitroGLYCERIN (NITROSTAT) 0.4 MG SL tablet Place 1 tablet (0.4 mg total) under the tongue every 5 (five) minutes as needed for chest pain.  90 tablet  3  . polyethylene glycol powder (GLYCOLAX/MIRALAX) powder Take 17 g by mouth 2 (two) times daily as needed.  527 g  3  . potassium chloride SA (K-DUR,KLOR-CON) 20 MEQ tablet Take 20 mEq by mouth daily.      . tamsulosin (FLOMAX) 0.4 MG CAPS capsule TAKE ONE CAPSULE BY MOUTH EVERY DAY      . warfarin (COUMADIN) 5 MG tablet Take 1 tablet (5 mg total) by mouth daily.  90 tablet  3  . zoster vaccine live, PF, (ZOSTAVAX) 69629 UNT/0.65ML injection Inject 19,400 Units into the skin once.  1 each  0   No current facility-administered medications for this visit.    Past Medical History  Diagnosis Date  . Myocardial infarction   . Hyperlipidemia   . Hypertension   . COPD (chronic obstructive pulmonary disease)   .  Peripheral artery disease   . Atrial fibrillation   . Chronic pain     Past Surgical History  Procedure Laterality Date  . Hernia repair    . Femoral-popliteal bypass graft    . Percutaneous coronary stent intervention (pci-s)    . Back surgery    . Ankle surgery      Family History  Problem Relation Age of Onset  . Diabetes Mother   . Cancer Mother   . Heart disease Mother   . Hyperlipidemia Mother   . Hypertension Mother   . Cancer Father   . Cancer Brother   . Heart disease Brother   . Depression Brother   . Early death Brother   . Hyperlipidemia Brother   . Hypertension Brother   . Alcohol abuse Sister   . Heart disease Sister   . Hyperlipidemia Sister   . Hypertension Sister   . Stroke Sister   . Heart disease Maternal Grandmother   . Kidney disease Maternal Grandmother   . Heart disease Maternal Grandfather   . Kidney disease Maternal Grandfather     History   Social History  . Marital Status: Married    Spouse Name: N/A    Number of Children:  N/A  . Years of Education: N/A   Occupational History  . Not on file.   Social History Main Topics  . Smoking status: Former Smoker    Types: Cigarettes    Quit date: 07/22/1998  . Smokeless tobacco: Current User    Types: Snuff  . Alcohol Use: No  . Drug Use: No  . Sexual Activity: No   Other Topics Concern  . Not on file   Social History Narrative  . No narrative on file    Review of Systems:  All systems reviewed.  They are negative to the above problem except as previously stated.  Vital Signs: BP 138/76  Pulse 60  Ht 6\' 4"  (1.93 m)  Wt 311 lb (141.069 kg)  BMI 37.87 kg/m2  SpO2 95%  Physical Exam Patient is a morbidly obese 73 yo in NAD HEENT:  Normocephalic, atraumatic. EOMI, PERRLA.  Neck: JVP is normal.  No bruits.  Lungs: clear to auscultation. No rales no wheezes. COurse cough Heart: Regular rate and rhythm. Normal S1, S2. No S3.   No significant murmurs. PMI not displaced.  Abdomen:  Supple.  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  Extremities:  Tr edema.  Chronic stasis changes.  1+PT R  2+ PT L  Musculoskeletal :moving all extremities.  Neuro:   alert and oriented x3.  CN II-XII grossly intact.   Assessment and Plan:  1.  CAD  No symptoms of angina  Will follow SOB  See again in Dec  If getting worse or no better pssible stress test.  Get echo then as baseline.    2.  PV disease. Has appt to establish here with C Dickson  3.  HTN  Good control  Continue meds  4.  HL  Will review.  Continue meds.    Encouraged him to stay ctive  Will set to see in December

## 2013-10-17 ENCOUNTER — Encounter: Payer: Self-pay | Admitting: Internal Medicine

## 2013-10-17 ENCOUNTER — Ambulatory Visit (INDEPENDENT_AMBULATORY_CARE_PROVIDER_SITE_OTHER): Payer: Medicare Other | Admitting: Internal Medicine

## 2013-10-17 VITALS — BP 138/76 | HR 60 | Ht 76.0 in | Wt 311.0 lb

## 2013-10-17 DIAGNOSIS — I2510 Atherosclerotic heart disease of native coronary artery without angina pectoris: Secondary | ICD-10-CM

## 2013-10-17 DIAGNOSIS — I739 Peripheral vascular disease, unspecified: Secondary | ICD-10-CM

## 2013-10-17 DIAGNOSIS — E785 Hyperlipidemia, unspecified: Secondary | ICD-10-CM

## 2013-10-17 DIAGNOSIS — I10 Essential (primary) hypertension: Secondary | ICD-10-CM

## 2013-10-17 NOTE — Patient Instructions (Signed)
Your physician recommends that you continue on your current medications as directed. Please refer to the Current Medication list given to you today. Your physician recommends that you schedule a follow-up appointment in: December with Dr. Harrington Challenger.

## 2013-10-29 ENCOUNTER — Ambulatory Visit: Payer: Medicare Other

## 2013-10-30 ENCOUNTER — Ambulatory Visit (INDEPENDENT_AMBULATORY_CARE_PROVIDER_SITE_OTHER): Payer: Medicare Other | Admitting: *Deleted

## 2013-10-30 DIAGNOSIS — M79609 Pain in unspecified limb: Secondary | ICD-10-CM

## 2013-10-30 DIAGNOSIS — I4891 Unspecified atrial fibrillation: Secondary | ICD-10-CM

## 2013-10-30 LAB — POCT INR: INR: 1.7

## 2013-11-04 ENCOUNTER — Encounter: Payer: Self-pay | Admitting: Vascular Surgery

## 2013-11-05 ENCOUNTER — Encounter (HOSPITAL_COMMUNITY): Payer: Medicare Other

## 2013-11-05 ENCOUNTER — Ambulatory Visit (INDEPENDENT_AMBULATORY_CARE_PROVIDER_SITE_OTHER)
Admission: RE | Admit: 2013-11-05 | Discharge: 2013-11-05 | Disposition: A | Discharge status: Home / Self Care | Payer: Medicare Other | Source: Ambulatory Visit | Attending: Vascular Surgery | Admitting: Vascular Surgery

## 2013-11-05 ENCOUNTER — Ambulatory Visit (INDEPENDENT_AMBULATORY_CARE_PROVIDER_SITE_OTHER): Payer: Medicare Other | Admitting: Vascular Surgery

## 2013-11-05 ENCOUNTER — Ambulatory Visit (HOSPITAL_COMMUNITY)
Admission: RE | Admit: 2013-11-05 | Discharge: 2013-11-05 | Disposition: A | Discharge status: Home / Self Care | Payer: Medicare Other | Source: Ambulatory Visit | Attending: Vascular Surgery | Admitting: Vascular Surgery

## 2013-11-05 ENCOUNTER — Encounter: Payer: Self-pay | Admitting: Vascular Surgery

## 2013-11-05 VITALS — BP 157/63 | HR 80 | Ht 76.0 in | Wt 315.7 lb

## 2013-11-05 DIAGNOSIS — Z87891 Personal history of nicotine dependence: Secondary | ICD-10-CM | POA: Diagnosis not present

## 2013-11-05 DIAGNOSIS — I10 Essential (primary) hypertension: Secondary | ICD-10-CM | POA: Diagnosis not present

## 2013-11-05 DIAGNOSIS — Z48812 Encounter for surgical aftercare following surgery on the circulatory system: Secondary | ICD-10-CM

## 2013-11-05 DIAGNOSIS — I739 Peripheral vascular disease, unspecified: Secondary | ICD-10-CM | POA: Diagnosis present

## 2013-11-05 DIAGNOSIS — E785 Hyperlipidemia, unspecified: Secondary | ICD-10-CM | POA: Diagnosis not present

## 2013-11-05 DIAGNOSIS — L97909 Non-pressure chronic ulcer of unspecified part of unspecified lower leg with unspecified severity: Secondary | ICD-10-CM | POA: Diagnosis not present

## 2013-11-05 DIAGNOSIS — I2510 Atherosclerotic heart disease of native coronary artery without angina pectoris: Secondary | ICD-10-CM

## 2013-11-05 NOTE — Addendum Note (Signed)
Addended by: Mena Goes on: 11/05/2013 11:26 AM   Modules accepted: Orders

## 2013-11-05 NOTE — Assessment & Plan Note (Signed)
Aced on his Doppler study in exam, he has evidence of moderate peripheral vascular disease with likely infrainguinal arterial occlusive disease bilaterally. He has monophasic Doppler signals in both feet although his ABIs are reasonable. Given that the wound in the right great toe is healing I would hold off on any further evaluation at this point, especially given the fact that he just had an arteriogram in [facility] last month which did not show any significant problems. Fortunately he is not a smoker. He is on a statin. I'll see him back in 6 months for follow up ABIs. He knows to call sooner if he has problems.

## 2013-11-05 NOTE — Progress Notes (Signed)
Patient ID: Terry Martinez, male   DOB: February 22, 1940, 73 y.o.   MRN: [id number]  Reason for Consult: PERIPHERAL VASCULAR DISEASE   Referred by Coral Spikes, DO  Subjective:     HPI:  Terry Martinez is a 73 y.o. male who underwent a left axillobifemoral bypass graft in [place] approximately 4 years ago. Approximately 3-4 months ago he developed a wound on his right great toe. This had been slow to heal. He underwent an arteriogram in [place] and I have the report which shows that this showed a patent bypass graft with no significant infrainguinal arterial occlusive disease on the right side, which is the side with the ulcer. I do not have the actual films to review. The patient is now moved to [place] and he presents for vascular follow up.  He is followed at the foot care center and the wound on the right great toe has improved significantly.  Of note, he has a history of atrial fibrillation and is on Coumadin.  Past Medical History  Diagnosis Date  . Myocardial infarction   . Hyperlipidemia   . Hypertension   . COPD (chronic obstructive pulmonary disease)   . Peripheral artery disease   . Atrial fibrillation   . Chronic pain    Family History  Problem Relation Age of Onset  . Diabetes Mother   . Cancer Mother   . Heart disease Mother   . Hyperlipidemia Mother   . Hypertension Mother   . Cancer Father   . Cancer Brother   . Heart disease Brother   . Depression Brother   . Early death Brother   . Hyperlipidemia Brother   . Hypertension Brother   . Alcohol abuse Sister   . Heart disease Sister   . Hyperlipidemia Sister   . Hypertension Sister   . Stroke Sister   . Heart disease Maternal Grandmother   . Kidney disease Maternal Grandmother   . Heart disease Maternal Grandfather   . Kidney disease Maternal Grandfather    Past Surgical History  Procedure Laterality Date  . Hernia repair    . Femoral-popliteal bypass graft    . Percutaneous  coronary stent intervention (pci-s)    . Back surgery    . Ankle surgery     Short Social History:  History  Substance Use Topics  . Smoking status: Former Smoker    Types: Cigarettes    Quit date: 07/22/1998  . Smokeless tobacco: Current User    Types: Snuff  . Alcohol Use: No   Allergies  Allergen Reactions  . Zocor [Simvastatin] Hives   Current Outpatient Prescriptions  Medication Sig Dispense Refill  . acetaminophen (TYLENOL) 500 MG tablet Take 500 mg by mouth every 6 (six) hours as needed for pain.      [name] albuterol (PROVENTIL HFA;VENTOLIN HFA) 108 (90 BASE) MCG/ACT inhaler Inhale 2 puffs into the lungs every 6 (six) hours as needed for wheezing or shortness of breath.  1 Inhaler  2  . allopurinol (ZYLOPRIM) 300 MG tablet Take 1 tablet (300 mg total) by mouth every morning.  90 tablet  3  . enalapril (VASOTEC) 10 MG tablet Take 10 mg by mouth daily.      [name] EPINEPHrine (EPI-PEN) 0.3 mg/0.3 mL SOAJ injection Inject 0.3 mLs (0.3 mg total) into the muscle once.  1 Device  1  . ferrous fumarate (HEMOCYTE - 106 MG FE) 325 (106 FE) MG TABS tablet Take 1 tablet by mouth every evening.      [name]  furosemide (LASIX) 40 MG tablet Take 40 mg by mouth daily.      [name] lovastatin (MEVACOR) 20 MG tablet Take 1 tablet (20 mg total) by mouth at bedtime.  90 tablet  2  . metoprolol tartrate (LOPRESSOR) 25 MG tablet Take 25 mg by mouth 2 (two) times daily.      . Multiple Vitamins-Minerals (MENS MULTIVITAMIN PLUS PO) Take 1 tablet by mouth daily.      . nitroGLYCERIN (NITROSTAT) 0.4 MG SL tablet Place 1 tablet (0.4 mg total) under the tongue every 5 (five) minutes as needed for chest pain.  90 tablet  3  . polyethylene glycol powder (GLYCOLAX/MIRALAX) powder Take 17 g by mouth 2 (two) times daily as needed.  527 g  3  . potassium chloride SA (K-DUR,KLOR-CON) 20 MEQ tablet Take 20 mEq by mouth daily.      . tamsulosin (FLOMAX) 0.4 MG CAPS capsule TAKE ONE CAPSULE BY MOUTH EVERY DAY      . warfarin  (COUMADIN) 5 MG tablet Take 1 tablet (5 mg total) by mouth daily.  90 tablet  3  . zoster vaccine live, PF, (ZOSTAVAX) 65993 UNT/0.65ML injection Inject 19,400 Units into the skin once.  1 each  0   No current facility-administered medications for this visit.   Review of Systems  Constitutional: Negative for chills and fever.  Eyes: Negative for loss of vision.  Respiratory: Negative for cough and wheezing.  Cardiovascular: Positive for dyspnea with exertion. Negative for chest pain, chest tightness, claudication, orthopnea and palpitations.  GI: Negative for blood in stool and vomiting.  GU: Negative for dysuria and hematuria.  Musculoskeletal: Negative for leg pain, joint pain and myalgias.  Skin: Positive for wound. Negative for rash.  Neurological: Negative for dizziness and speech difficulty.  Hematologic: Negative for bruises/bleeds easily. Psychiatric: Negative for depressed mood.       Objective:  Objective  Filed Vitals:   11/05/13 1011  BP: 157/63  Pulse: 80  Height: 6\' 4"  (1.93 m)  Weight: 315 lb 11.2 oz (143.201 kg)  SpO2: 97%   Body mass index is 38.44 kg/(m^2).  Physical Exam  Constitutional: He is oriented to person, place, and time. He appears well-developed and well-nourished.  HENT:  Head: Normocephalic and atraumatic.  Neck: Neck supple. No JVD present. No thyromegaly present.  Cardiovascular: Normal rate, regular rhythm and normal heart sounds.  Exam reveals no friction rub.   No murmur heard. Pulses:      Radial pulses are 2+ on the right side, and 2+ on the left side.       Femoral pulses are 1+ on the right side, and 1+ on the left side.      Popliteal pulses are 0 on the right side, and 0 on the left side.       Dorsalis pedis pulses are 0 on the right side, and 0 on the left side.       Posterior tibial pulses are 0 on the right side, and 0 on the left side.  I do not detect carotid bruits.  Pulmonary/Chest: Breath sounds normal. He has no  wheezes. He has no rales.  Abdominal: Soft. Bowel sounds are normal. There is no tenderness.  I do not palpate an aneurysm.  Musculoskeletal: Normal range of motion. He exhibits no edema.  Lymphadenopathy:    He has no cervical adenopathy.  Neurological: He is alert and oriented to person, place, and time. He has normal strength. No sensory deficit.  Skin: No lesion and no rash noted.  He has hyperpigmentation bilaterally consistent with chronic venous insufficiency.  Psychiatric: He has a normal mood and affect.   Data: I have reviewed his records from [facility]. His arteriogram results are discussed above. I have independently interpreted his arterial duplex scan today which shows that his left axillobifemoral bypass graft is patent. There are monophasic Doppler signals throughout the graft.  I have also reviewed his arterial Doppler study which shows monophasic Doppler signals in the right dorsalis pedis and posterior tibial positions with an ABI of 89% on the right. On the left side he has a biphasic dorsalis pedis signal with a monophasic posterior tibial signal. ABI on the left is 90%.      Assessment/Plan:     PAD (peripheral artery disease) Aced on his Doppler study in exam, he has evidence of moderate peripheral vascular disease with likely infrainguinal arterial occlusive disease bilaterally. He has monophasic Doppler signals in both feet although his ABIs are reasonable. Given that the wound in the right great toe is healing I would hold off on any further evaluation at this point, especially given the fact that he just had an arteriogram in [facility] last month which did not show any significant problems. Fortunately he is not a smoker. He is on a statin. I'll see him back in 6 months for follow up ABIs. He knows to call sooner if he has problems.   Angelia Mould MD [facility]

## 2013-11-10 ENCOUNTER — Ambulatory Visit (INDEPENDENT_AMBULATORY_CARE_PROVIDER_SITE_OTHER): Payer: Medicare Other | Admitting: *Deleted

## 2013-11-10 DIAGNOSIS — I4891 Unspecified atrial fibrillation: Secondary | ICD-10-CM

## 2013-11-10 DIAGNOSIS — Z23 Encounter for immunization: Secondary | ICD-10-CM

## 2013-11-10 LAB — POCT INR: INR: 2.2

## 2013-11-12 ENCOUNTER — Ambulatory Visit: Payer: Medicare Other | Admitting: Family Medicine

## 2013-11-13 ENCOUNTER — Ambulatory Visit: Payer: Medicare Other | Admitting: Podiatry

## 2013-11-17 ENCOUNTER — Ambulatory Visit: Payer: Medicare Other | Admitting: Podiatry

## 2013-11-21 ENCOUNTER — Ambulatory Visit (INDEPENDENT_AMBULATORY_CARE_PROVIDER_SITE_OTHER): Payer: Medicare Other | Admitting: Family Medicine

## 2013-11-21 VITALS — BP 138/90 | HR 62 | Ht 76.0 in | Wt 310.4 lb

## 2013-11-21 DIAGNOSIS — I10 Essential (primary) hypertension: Secondary | ICD-10-CM

## 2013-11-21 DIAGNOSIS — E785 Hyperlipidemia, unspecified: Secondary | ICD-10-CM

## 2013-11-21 DIAGNOSIS — Z79899 Other long term (current) drug therapy: Secondary | ICD-10-CM

## 2013-11-21 DIAGNOSIS — L84 Corns and callosities: Secondary | ICD-10-CM

## 2013-11-21 DIAGNOSIS — Z0000 Encounter for general adult medical examination without abnormal findings: Secondary | ICD-10-CM

## 2013-11-21 NOTE — Patient Instructions (Signed)
It was great to see you.  Your lab appointment is on 10/5 @ 10:30.  Continue to take your medications as prescribed.  Continue to trying to lose weight (keep up the good work).

## 2013-11-23 NOTE — Assessment & Plan Note (Signed)
Obtaining Lipid panel. May consider decreasing statin given significantly low cholesterol.

## 2013-11-23 NOTE — Progress Notes (Signed)
   Subjective:    Patient ID: Terry Martinez, male    DOB: [birth year], 73 y.o.   MRN: [id number]  HPI 73 year old male with a complex PMH presents for follow up.  1) HTN Disease Monitoring:Home BP Monitoring - No Medications:Vasotec, Lopressor Compliance -  Yes ROS: Denies chest pain, SOB, lightheadedness/dizziness   2) HLD - Well controlled on Lovastatin. Lipid Panel     Component Value Date/Time   CHOL 63 12/06/2012 0829   TRIG 61 12/06/2012 0829   HDL 31* 12/06/2012 0829   CHOLHDL 2.0 12/06/2012 0829   VLDL 12 12/06/2012 0829   LDLCALC 20 12/06/2012 0829   3) Preventative care - Up to date; Has already received flu vaccine.  Review of Systems Per HPI    Objective:   Physical Exam Filed Vitals:   11/21/13 1519  BP: 138/90  Pulse: 62   Exam: General: well appearing obese male in NAD.  Cardiovascular: RRR. No murmurs, rubs, or gallops. Respiratory: CTAB. No rales, rhonchi, or wheeze. Abdomen: large ventral hernia noted.  Extremities: chronic venous stasis changes noted.      Assessment & Plan:  See Problem List

## 2013-11-23 NOTE — Assessment & Plan Note (Signed)
Well controlled Continue current therapy 

## 2013-11-23 NOTE — Assessment & Plan Note (Signed)
Up to date

## 2013-11-24 ENCOUNTER — Ambulatory Visit: Payer: Medicare Other | Admitting: Podiatry

## 2013-11-24 ENCOUNTER — Other Ambulatory Visit: Payer: Medicare Other

## 2013-11-24 ENCOUNTER — Encounter: Payer: Self-pay | Admitting: Podiatry

## 2013-11-24 VITALS — BP 142/72 | HR 89 | Resp 16

## 2013-11-24 DIAGNOSIS — Z79899 Other long term (current) drug therapy: Secondary | ICD-10-CM

## 2013-11-24 DIAGNOSIS — L89891 Pressure ulcer of other site, stage 1: Secondary | ICD-10-CM | POA: Diagnosis not present

## 2013-11-24 DIAGNOSIS — I10 Essential (primary) hypertension: Secondary | ICD-10-CM

## 2013-11-24 DIAGNOSIS — L97511 Non-pressure chronic ulcer of other part of right foot limited to breakdown of skin: Secondary | ICD-10-CM

## 2013-11-24 DIAGNOSIS — E785 Hyperlipidemia, unspecified: Secondary | ICD-10-CM

## 2013-11-24 LAB — CBC
HEMATOCRIT: 41.4 % (ref 39.0–52.0)
HEMOGLOBIN: 14.2 g/dL (ref 13.0–17.0)
MCH: 30.3 pg (ref 26.0–34.0)
MCHC: 34.3 g/dL (ref 30.0–36.0)
MCV: 88.5 fL (ref 78.0–100.0)
Platelets: 169 10*3/uL (ref 150–400)
RBC: 4.68 MIL/uL (ref 4.22–5.81)
RDW: 15.9 % — AB (ref 11.5–15.5)
WBC: 8.1 10*3/uL (ref 4.0–10.5)

## 2013-11-24 LAB — BASIC METABOLIC PANEL
BUN: 23 mg/dL (ref 6–23)
CHLORIDE: 101 meq/L (ref 96–112)
CO2: 29 mEq/L (ref 19–32)
CREATININE: 1.32 mg/dL (ref 0.50–1.35)
Calcium: 9.3 mg/dL (ref 8.4–10.5)
Glucose, Bld: 99 mg/dL (ref 70–99)
POTASSIUM: 4.3 meq/L (ref 3.5–5.3)
Sodium: 137 mEq/L (ref 135–145)

## 2013-11-24 LAB — LIPID PANEL
Cholesterol: 72 mg/dL (ref 0–200)
HDL: 31 mg/dL — ABNORMAL LOW (ref >39)
LDL CALC: 28 mg/dL (ref 0–99)
TRIGLYCERIDES: 65 mg/dL (ref <150)
Total CHOL/HDL Ratio: 2.3 Ratio
VLDL: 13 mg/dL (ref 0–40)

## 2013-11-24 NOTE — Progress Notes (Signed)
Subjective:     Patient ID: Terry Martinez, male   DOB: [birth year], 73 y.o.   MRN: [id number]  HPI patient presents stating I think it's getting better but it did bleed when I try to do a lot of walking on it this weekend   Review of Systems     Objective:   Physical Exam Neurovascular status is stable with small breakdown of tissue plantar right hallux measuring about 2 x 2 mm that's localized without significant subcutaneous exposure with no proximal edema erythema or drainage    Assessment:     Small ulceration plantar aspect right big toe    Plan:     Debrided tissue and applied thick padding with Iodosorb under occlusion. Continue use padding for the next month and reappoint in earlier if any redness swelling or drainage should occur

## 2013-11-24 NOTE — Progress Notes (Signed)
BMP,CBC AND FLP DONE TODAY Terry Martinez

## 2013-12-01 ENCOUNTER — Encounter: Payer: Self-pay | Admitting: Podiatry

## 2013-12-01 ENCOUNTER — Ambulatory Visit (INDEPENDENT_AMBULATORY_CARE_PROVIDER_SITE_OTHER): Payer: Medicare Other | Admitting: Podiatry

## 2013-12-01 VITALS — BP 141/70 | HR 82 | Resp 16

## 2013-12-01 DIAGNOSIS — L97522 Non-pressure chronic ulcer of other part of left foot with fat layer exposed: Secondary | ICD-10-CM

## 2013-12-01 DIAGNOSIS — I2510 Atherosclerotic heart disease of native coronary artery without angina pectoris: Secondary | ICD-10-CM

## 2013-12-01 MED ORDER — AMOXICILLIN-POT CLAVULANATE 875-125 MG PO TABS: 1.0000 | ORAL_TABLET | Freq: Two times a day (BID) | ORAL | Status: DC

## 2013-12-02 NOTE — Progress Notes (Signed)
Subjective:     Patient ID: Terry Martinez, male   DOB: Mar 18, 1940, 73 y.o.   MRN: [id number]  HPI patient presents stating he was very active on his right foot and the big toe is not covered and it opened up a little bit and he wanted to get it checked   Review of Systems     Objective:   Physical Exam Neurovascular status on changed with mild redness around the big toe right plantar that's localized in nature with a small amount of drainage noted with no odor or no proximal edema erythema noted    Assessment:     Localized infection right big toe    Plan:     Debris did tissue and flushed and applied Iodosorb with dressing with padding and placed on Augmentin 875 mg twice a day and instructed to monitor this and if any proximal edema erythema or any indications of systemic infection were to occur to contact immediately or go to the emergency room

## 2013-12-03 ENCOUNTER — Ambulatory Visit: Payer: Medicare Other

## 2013-12-08 ENCOUNTER — Other Ambulatory Visit: Payer: Self-pay | Admitting: *Deleted

## 2013-12-09 MED ORDER — LOVASTATIN 20 MG PO TABS: 20.0000 mg | ORAL_TABLET | Freq: Every day | ORAL | Status: DC

## 2013-12-15 ENCOUNTER — Ambulatory Visit (INDEPENDENT_AMBULATORY_CARE_PROVIDER_SITE_OTHER): Payer: Medicare Other | Admitting: *Deleted

## 2013-12-15 ENCOUNTER — Other Ambulatory Visit: Payer: Self-pay | Admitting: *Deleted

## 2013-12-15 DIAGNOSIS — I4891 Unspecified atrial fibrillation: Secondary | ICD-10-CM

## 2013-12-15 DIAGNOSIS — Z7901 Long term (current) use of anticoagulants: Secondary | ICD-10-CM

## 2013-12-15 DIAGNOSIS — L97501 Non-pressure chronic ulcer of other part of unspecified foot limited to breakdown of skin: Secondary | ICD-10-CM

## 2013-12-15 LAB — POCT INR: INR: 1.8

## 2013-12-16 ENCOUNTER — Ambulatory Visit (INDEPENDENT_AMBULATORY_CARE_PROVIDER_SITE_OTHER): Payer: Medicare Other

## 2013-12-16 VITALS — BP 144/78 | HR 74 | Temp 96.3°F | Resp 17

## 2013-12-16 DIAGNOSIS — I739 Peripheral vascular disease, unspecified: Secondary | ICD-10-CM

## 2013-12-16 DIAGNOSIS — I2510 Atherosclerotic heart disease of native coronary artery without angina pectoris: Secondary | ICD-10-CM

## 2013-12-16 DIAGNOSIS — L97522 Non-pressure chronic ulcer of other part of left foot with fat layer exposed: Secondary | ICD-10-CM

## 2013-12-16 DIAGNOSIS — G629 Polyneuropathy, unspecified: Secondary | ICD-10-CM

## 2013-12-16 DIAGNOSIS — L97511 Non-pressure chronic ulcer of other part of right foot limited to breakdown of skin: Secondary | ICD-10-CM

## 2013-12-16 DIAGNOSIS — L03116 Cellulitis of left lower limb: Secondary | ICD-10-CM

## 2013-12-16 MED ORDER — ALLOPURINOL 300 MG PO TABS: 300.0000 mg | ORAL_TABLET | Freq: Every morning | ORAL | Status: DC

## 2013-12-16 MED ORDER — METOPROLOL TARTRATE 25 MG PO TABS: 25.0000 mg | ORAL_TABLET | Freq: Two times a day (BID) | ORAL | Status: DC

## 2013-12-16 MED ORDER — LINEZOLID 600 MG PO TABS: 600.0000 mg | ORAL_TABLET | Freq: Two times a day (BID) | ORAL | Status: DC

## 2013-12-16 NOTE — Patient Instructions (Signed)
ANTIBACTERIAL SOAP INSTRUCTIONS  THE DAY AFTER PROCEDURE  Please follow the instructions your doctor has marked.   Shower as usual. Before getting out, place a drop of antibacterial liquid soap (Dial) on a wet, clean washcloth.  Gently wipe washcloth over affected area.  Afterward, rinse the area with warm water.  Blot the area dry with a soft cloth and cover with antibiotic ointment (neosporin, polysporin, bacitracin) and band aid or gauze and tape  Place 3-4 drops of antibacterial liquid soap in a quart of warm tap water.  Submerge foot into water for 20 minutes.  If bandage was applied after your procedure, leave on to allow for easy lift off, then remove and continue with soak for the remaining time.  Next, blot area dry with a soft cloth and cover with a bandage.  Apply other medications as directed by your doctor, such as cortisporin otic solution (eardrops) or neosporin antibiotic ointment  Wash both feet ulcer sites daily with antibacterial soap and warm water dry thoroughly apply Iodosorb antibiotic ointment and gauze dressing to each toe daily. Repeat the washing and dressing change every day until resolved. Maintain a thick soled shoe or sandal at all times. No barefoot

## 2013-12-16 NOTE — Progress Notes (Signed)
Subjective:    Patient ID: Terry Martinez, male    DOB: 03/24/1940, 73 y.o.   MRN: [id number]  HPI Pt presents for wound check, right great toe and left 2nd met, drainage noted and erythema noted on left foot since yesterday, warm and redden area on top of left foot   Review of Systems  Constitutional: Positive for activity change and fatigue.  HENT: Negative.   Eyes: Negative.   Respiratory: Negative.   Cardiovascular: Negative.   Genitourinary: Negative.   Musculoskeletal: Positive for gait problem.  Skin: Positive for pallor and wound.  Neurological: Positive for weakness and numbness.  Hematological: Negative.   Psychiatric/Behavioral: Negative.        Objective:   Physical Exam 73 year old white male presents at this time with family member. Patient is being treated for ulcer of his right hallux having had partial rotation of the right great toe there is an ulcer under the plantar hallux IP joint region. On the right eye reserving gauze dressing were noted to be in place however in the last couple of days patient is developed bleeding and ulceration of the distal second toe left foot. There is also redness over any swelling over the entire forefoot metatarsal and mid tarsal area of the left foot with localized edema and erythema dorsally and ulceration distal tuft second digit. Lower extremity objective findings as follows vascular status is noted to be diminished and pedal pulses nonpalpable DP or PT 0 over 4 bilateral patient's had previous stenting or bypass of some sort and has had vascular workup was told his circulation is adequate or good I advised it may be adequate but certainly not a normal circulation and nonpalpable pulses noted capillary refill time 4 seconds all digits open wounds and ulcers of the right hallux and second digit left foot noted. There is slight warmth increased temperature to the left foot dorsum of the foot from the MTP area and proximal. Neurologically  epicritic and proprioceptive sensations appear to be intact although diminished distally on Semmes Weinstein to the forefoot and digits. Patient ambulates in a pair of tennis shoes and sneakers and socks does worsen, how she will around the house. Again past 2 days had increased redness and draining second toe left right foot ulcer appears to be stable has been on antibiotics recently was on amoxicillin for correction Augmentin which she has completed a couple days back. Orthopedic biomechanical exam reveals semirigid digital contractures mildly to be deforming lateral deviation of hallux no current x-rays taken at this time however nail somewhat brittle criptotic incurvated and friable as well.       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 Localize cellulitis dorsum of left foot patient indicates he's had a history of MRSA infections had to be IV antibiotic with a PICC line year or 2 ago for those infections patient was advised that statistically once patient's had a MRSA infection is likely to have recurrence of MRSA infection although the wounds appear to be contaminated and I do not feel culturing an ulcer at this time would be appropriate would likely just reveal superficial fluoroscopy cellulitis of the foot is significant and having completed an Augmentin regimen just recently within days and having a new flareup or infection I  do not concern this is most likely some kind of a resistant infection. Therefore we'll appeared we treat as if it is a MRSA infection. Prescription for Zyvox as noted to pharmacy on behalf of the patient patient will maintain Zyvox twice a day as instructed for 10 days reappoint within 1 week for follow-up by Dr. Felisa Bonier been  managing his ulcers thus far. Contact us immediately very fever or chills any changes or exacerbation of symptoms at any time. Contact us any side effects of medication occur or any interactions with other medicines. Patient will also follow up within 1 week with Dr. Felisa Bonier contact us immediately if there is any changes or exacerbation of symptoms of the foot as well. Both ulcers are debrided down to subcutaneous tissue level eye reserving gauze dressings applied to both hallux right and second digit left. Patient is given instructions for daily cleansing with soap and water and Iodosorb and gauze dressings re-applications. Next  [employer] DPM

## 2013-12-19 ENCOUNTER — Other Ambulatory Visit: Payer: Self-pay | Admitting: Family Medicine

## 2013-12-19 NOTE — Telephone Encounter (Signed)
Refill request for Lasix & lovastatin . Please send to [facility] on [address] in [place], [place]

## 2013-12-22 ENCOUNTER — Encounter: Payer: Self-pay | Admitting: Podiatry

## 2013-12-22 ENCOUNTER — Ambulatory Visit: Payer: Medicare Other | Admitting: Podiatry

## 2013-12-22 VITALS — BP 121/71 | HR 71 | Resp 16

## 2013-12-22 DIAGNOSIS — L89891 Pressure ulcer of other site, stage 1: Secondary | ICD-10-CM

## 2013-12-22 DIAGNOSIS — L97522 Non-pressure chronic ulcer of other part of left foot with fat layer exposed: Secondary | ICD-10-CM

## 2013-12-22 MED ORDER — FUROSEMIDE 40 MG PO TABS: 40.0000 mg | ORAL_TABLET | Freq: Every day | ORAL | Status: DC

## 2013-12-22 MED ORDER — LOVASTATIN 20 MG PO TABS: 20.0000 mg | ORAL_TABLET | Freq: Every day | ORAL | Status: DC

## 2013-12-22 NOTE — Telephone Encounter (Signed)
Spoke with patient and informed him of below 

## 2013-12-23 NOTE — Progress Notes (Signed)
Subjective:     Patient ID: Terry Martinez, male   DOB: 05-27-1940, 73 y.o.   MRN: [id number]  HPIpatient points to left foot stating it feels better but it still is draining a little bit   Review of Systems     Objective:   Physical Exam Neurovascular status unchanged with disease that's followed by a vascular surgeon in [place] with a small amount of distal erythema and drainage of the second toe left foot with no proximal edema erythema or drainage noted    Assessment:     Localized ulceration distal second left with hallux right that's healed well    Plan:     Reviewed condition and debrided tissue and applied Iodosorb under occlusion. Instructed on soaks and dispensed buttress pad to lift the toe and gave instructions that hopefully will heal this with soaks and medication and if it does not get better we will need to evaluate and there is a possibility long-term he will require digital amputation area reappoint in several weeks earlier if any increased redness or other issues should occur

## 2013-12-29 ENCOUNTER — Ambulatory Visit: Payer: Medicare Other

## 2013-12-31 ENCOUNTER — Ambulatory Visit (INDEPENDENT_AMBULATORY_CARE_PROVIDER_SITE_OTHER): Payer: Medicare Other | Admitting: *Deleted

## 2013-12-31 DIAGNOSIS — I4891 Unspecified atrial fibrillation: Secondary | ICD-10-CM

## 2013-12-31 DIAGNOSIS — Z7901 Long term (current) use of anticoagulants: Secondary | ICD-10-CM

## 2013-12-31 LAB — POCT INR: INR: 2.9

## 2014-01-05 ENCOUNTER — Encounter: Payer: Self-pay | Admitting: Podiatry

## 2014-01-05 ENCOUNTER — Ambulatory Visit (INDEPENDENT_AMBULATORY_CARE_PROVIDER_SITE_OTHER): Payer: Medicare Other | Admitting: Podiatry

## 2014-01-05 VITALS — BP 131/68 | HR 78 | Resp 16

## 2014-01-05 DIAGNOSIS — I2510 Atherosclerotic heart disease of native coronary artery without angina pectoris: Secondary | ICD-10-CM

## 2014-01-05 DIAGNOSIS — L97522 Non-pressure chronic ulcer of other part of left foot with fat layer exposed: Secondary | ICD-10-CM

## 2014-01-05 NOTE — Progress Notes (Signed)
Subjective:     Patient ID: Terry Martinez, male   DOB: 1940/02/26, 73 y.o.   MRN: [id number]  HPIpatient states that my ulcers seem to be improving and I think right now I'm pretty stable   Review of Systems     Objective:   Physical Exam Neurovascular status unchanged with small ulcer second digit left distal that significantly improved with a crusted tissue and crust on the right hallux that is also localized with no drainage or odor noted    Assessment:     Ulcerations that are improving right hallux second toe left    Plan:     Reviewed condition and recommended continued padding of the areas soaks and crest pad for the left. Reappoint for routine visit and less any drainage should occur

## 2014-01-20 ENCOUNTER — Other Ambulatory Visit: Payer: Self-pay | Admitting: Family Medicine

## 2014-01-20 MED ORDER — ENALAPRIL MALEATE 10 MG PO TABS: 10.0000 mg | ORAL_TABLET | Freq: Every day | ORAL | Status: DC

## 2014-01-20 NOTE — Telephone Encounter (Signed)
Pt called and needs a refill on his enalapril called in. jw

## 2014-01-21 NOTE — Telephone Encounter (Signed)
Spoke with patient and informed him that requested rx has been sent in by Dr. Lacinda Axon

## 2014-01-23 ENCOUNTER — Encounter: Payer: Self-pay | Admitting: Podiatrist

## 2014-01-23 ENCOUNTER — Ambulatory Visit (INDEPENDENT_AMBULATORY_CARE_PROVIDER_SITE_OTHER): Payer: Medicare Other | Admitting: Podiatrist

## 2014-01-23 VITALS — BP 134/65 | HR 63 | Temp 96.5°F | Resp 16

## 2014-01-23 DIAGNOSIS — M79673 Pain in unspecified foot: Secondary | ICD-10-CM

## 2014-01-23 DIAGNOSIS — I2510 Atherosclerotic heart disease of native coronary artery without angina pectoris: Secondary | ICD-10-CM

## 2014-01-23 DIAGNOSIS — G629 Polyneuropathy, unspecified: Secondary | ICD-10-CM

## 2014-01-23 DIAGNOSIS — L97522 Non-pressure chronic ulcer of other part of left foot with fat layer exposed: Secondary | ICD-10-CM

## 2014-01-23 DIAGNOSIS — I739 Peripheral vascular disease, unspecified: Secondary | ICD-10-CM

## 2014-01-23 MED ORDER — LINEZOLID 600 MG PO TABS: 600.0000 mg | ORAL_TABLET | Freq: Two times a day (BID) | ORAL | Status: DC

## 2014-01-23 NOTE — Patient Instructions (Signed)
  Continue dressing your toe with the orange Iodosorb and a dressing.  Start taking your antibiotic twice daily (ie:  8 am, 8 pm)-- also take a pro biotic with this medication to lessen the chance of bowel or stomach upset

## 2014-01-26 ENCOUNTER — Ambulatory Visit: Payer: Medicare Other

## 2014-01-26 ENCOUNTER — Ambulatory Visit (INDEPENDENT_AMBULATORY_CARE_PROVIDER_SITE_OTHER): Payer: Medicare Other | Admitting: *Deleted

## 2014-01-26 DIAGNOSIS — Z7901 Long term (current) use of anticoagulants: Secondary | ICD-10-CM

## 2014-01-26 DIAGNOSIS — I4891 Unspecified atrial fibrillation: Secondary | ICD-10-CM

## 2014-01-26 LAB — POCT INR: INR: 1.9

## 2014-01-26 NOTE — Progress Notes (Signed)
   Subjective:    Patient ID: Terry Martinez, male    DOB: 04-May-1940, 73 y.o.   MRN: [id number]  HPI Pt presents for wound check, right great toe -  Patient relates the toe became red and swollen although the actual appearance of the ulcer itself hasn't changed.  He has a history of MRSA and has tolerated Zyvox in the past which did a good job at clearing up a previous infection.     Objective:   Physical Exam 73 year old white male presents for ulcer of his right hallux having had partial amputation of the right great toe there is an ulcer under the plantar hallux IP joint region. Right foot has localized edema and erythema dorsally at the hallux and extending to the first metatarsal midshaft region. Vascular status is noted to be diminished and pedal pulses nonpalpable DP or PT 0 over 4 bilateral.  Previous stenting or bypass is related.  Neurologically epicritic and proprioceptive sensations appear to be intact although diminished distally on Semmes Weinstein to the forefoot and digits. Patient ambulates in a pair of tennis shoes and sneakers and socks however does state he spends the majority of the time in a chair and not on his feet. He denies any systemic signs of infection.  No fevers or chills reported.     Assessment & Plan:  Assessment:  Localized cellulitis right hallux with ulceration- previous history of MRSA infection  Plan:  He did well with Zyvox in the past and thus, I wrote for a 10 day rx of zyvox.  He is to also take a probiotic with this medication.  debriement of the ulceration accomplished today and a iodosorb dressing was applied.   He will follow up with Dr. Paulla Dolly in 1 week to 10 days.  He will continue to stay off his foot and elevate.  If the infection becomes more red, swollen, malodorous, or drainage is noted to increase he is to report to the ER immediately as he will likely need IV antibiotics.  He will continue to dress his wounds.

## 2014-01-28 ENCOUNTER — Ambulatory Visit: Payer: Medicare Other

## 2014-01-29 ENCOUNTER — Ambulatory Visit (INDEPENDENT_AMBULATORY_CARE_PROVIDER_SITE_OTHER): Payer: Medicare Other | Admitting: Family Medicine

## 2014-01-29 ENCOUNTER — Telehealth: Payer: Self-pay | Admitting: *Deleted

## 2014-01-29 ENCOUNTER — Ambulatory Visit (INDEPENDENT_AMBULATORY_CARE_PROVIDER_SITE_OTHER): Payer: Medicare Other

## 2014-01-29 ENCOUNTER — Encounter: Payer: Self-pay | Admitting: Podiatry

## 2014-01-29 ENCOUNTER — Encounter: Payer: Self-pay | Admitting: Family Medicine

## 2014-01-29 ENCOUNTER — Ambulatory Visit (INDEPENDENT_AMBULATORY_CARE_PROVIDER_SITE_OTHER): Payer: Medicare Other | Admitting: Podiatry

## 2014-01-29 VITALS — BP 130/65 | HR 53 | Temp 97.2°F | Resp 16

## 2014-01-29 VITALS — BP 144/76 | HR 58 | Temp 97.6°F | Ht 76.0 in | Wt 299.1 lb

## 2014-01-29 DIAGNOSIS — L97511 Non-pressure chronic ulcer of other part of right foot limited to breakdown of skin: Secondary | ICD-10-CM

## 2014-01-29 DIAGNOSIS — I739 Peripheral vascular disease, unspecified: Secondary | ICD-10-CM | POA: Diagnosis not present

## 2014-01-29 DIAGNOSIS — M86171 Other acute osteomyelitis, right ankle and foot: Secondary | ICD-10-CM | POA: Diagnosis not present

## 2014-01-29 DIAGNOSIS — I2510 Atherosclerotic heart disease of native coronary artery without angina pectoris: Secondary | ICD-10-CM

## 2014-01-29 DIAGNOSIS — M79674 Pain in right toe(s): Secondary | ICD-10-CM

## 2014-01-29 DIAGNOSIS — L97519 Non-pressure chronic ulcer of other part of right foot with unspecified severity: Secondary | ICD-10-CM

## 2014-01-29 NOTE — Progress Notes (Signed)
Subjective:     Patient ID: Terry Martinez, male   DOB: 1940/04/04, 73 y.o.   MRN: [id number]  HPI patient presents stating I broke down again on the stump of my big toe right and I was treated by another physician in your group with Zyvox and it is doing better but I am concerned why it continues to do this. States it was doing fine and then just in the last few days it broke down again   Review of Systems     Objective:   Physical Exam Neurovascular status unchanged with toe noted to be warm and noted to have a small plantar breakdown of tissue measuring about 8 mm in length by 5 mm in width with no current subcutaneous exposure and no proximal edema erythema or drainage noted. There is no odor in the area and temperature is normal    Assessment:     Continue ulceration right plantar hallux that continues to breakdown despite wound care that we have rendered over the last 8 months    Plan:     Reviewed condition and we will get the results of the last arterial studies from his vascular surgeon in [place] and also we are getting him in to the vascular group in [facility] for evaluation. 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 I am referring to Dr. Jacqualyn Posey for evaluation and possible amputation

## 2014-01-29 NOTE — Telephone Encounter (Signed)
Patient's sister called stating that he saw Dr. Lacinda Axon and that he set him up an appointment to see Dr. Scot Dock for dopplers on Dec. 18th. I told her I was going to call Dr. Nicole Cella office and see if we could move that appointment up to Monday or Tuesday next week.

## 2014-01-29 NOTE — Telephone Encounter (Signed)
Called Dr. Nicole Cella office and left message for Rip Harbour to call me back to reschedule this appt to sooner.

## 2014-01-29 NOTE — Progress Notes (Signed)
   Subjective:    Patient ID: Terry Martinez, male    DOB: 1940/06/18, 73 y.o.   MRN: [id number]  HPI 73 year old gentleman with a complex past medical history including hypertension, A. fib, hyperlipidemia, CAD, and PAD status post bifemoral bypass presents to the clinic today for evaluation of right foot ulcer.  1) Right big toe ulcer  Patient has a long-standing ulcer of his right big toe.  He's been battling this for approximately 9 months to a year.  Last time I saw him the wound was healing well.  He is subsequently been followed by podiatry.  He has had the ulcer cleaned and debrided numerous times.    On 12/4, patient was seen by podiatry. Wound was thought to be infected and started on a ten-day course of Zyvox.  Patient saw podiatry today (12/10) and xray was obtained.  I spoke with Dr. Paulla Dolly who feels like there is underlying osteomyelitis.  Today, patient states that he is doing well. He is very concerned that the wound is not healing. No reported drainage, redness, fevers, chills.   Review of Systems Per HPI    Objective:   Physical Exam Filed Vitals:   01/29/14 1112  BP: 144/76  Pulse: 58  Temp: 97.6 F (36.4 C)   Exam: General: well appearing obese male in no acute distress. Extremities/skin: Right foot - Distal phalanx of right big toe has been amputated. There is a small ulceration noted on the plantar aspect of the right hallux. Ulcerations superficial. No appreciable subcutaneous tissue.  No associated redness. No drainage. Cannot palpate dorsalis pedis or posterior tibial pulses on the right foot. Assessment & Plan:  See Problem List

## 2014-01-29 NOTE — Patient Instructions (Signed)
It was nice to see you today.  The vascular office will be working you in ASAP. If you don't here from them in the next few days please call Tel [phone].  Follow up closely with Podiatry and with me.  Take care  [facility] DO

## 2014-01-30 DIAGNOSIS — L97511 Non-pressure chronic ulcer of other part of right foot limited to breakdown of skin: Secondary | ICD-10-CM | POA: Insufficient documentation

## 2014-01-30 NOTE — Telephone Encounter (Signed)
If they have him teed up for amputation that is fine. Tough for [place] to find times this time of year

## 2014-01-30 NOTE — Telephone Encounter (Signed)
-----   Message from Roney Jaffe, RN sent at 01/29/2014 12:22 PM EST ----- Caryl Pina, the information for this patient regarding the vascular studies, he had the study done at [facility] in [place] with Dr Lemar Livings, their phone number is [phone], so they should have his results, his next appt with them is 05/06/14, any questions call [name] [phone]

## 2014-01-30 NOTE — Assessment & Plan Note (Signed)
I discussed case with podiatrist Dr. Paulla Dolly who feels like amputation is necessary. Given patient's extensive history of peripheral vascular disease, patient will need to see vascular surgery prior to amputation. I spoke with Dr. Bridgett Larsson at [facility] agreed to get him  in the office for evaluation/clearance as soon as possible.  Patient is currently stable with no signs of systemic infection.

## 2014-01-30 NOTE — Telephone Encounter (Signed)
Melinda at [facility] returned my call. She said that Dr. Lacinda Axon spoke to Dr. Bridgett Larsson and Mr. Roma is scheduled to see Dr. Scot Dock for consult, possible amputation on Dec. 18th not for doppler study as we had originally thought. Will forward to Dr. Paulla Dolly to see if he wants a doppler and return to see Dr. Jacqualyn Posey as planned or keep the appointment with Dr. Scot Dock on the 18th for consult for amputation.

## 2014-02-02 ENCOUNTER — Encounter: Payer: Self-pay | Admitting: Internal Medicine

## 2014-02-02 ENCOUNTER — Ambulatory Visit (INDEPENDENT_AMBULATORY_CARE_PROVIDER_SITE_OTHER): Payer: Medicare Other | Admitting: Internal Medicine

## 2014-02-02 VITALS — BP 142/78 | HR 55 | Ht 76.0 in | Wt 300.0 lb

## 2014-02-02 DIAGNOSIS — E785 Hyperlipidemia, unspecified: Secondary | ICD-10-CM

## 2014-02-02 DIAGNOSIS — I2510 Atherosclerotic heart disease of native coronary artery without angina pectoris: Secondary | ICD-10-CM

## 2014-02-02 DIAGNOSIS — I10 Essential (primary) hypertension: Secondary | ICD-10-CM

## 2014-02-02 DIAGNOSIS — R0602 Shortness of breath: Secondary | ICD-10-CM

## 2014-02-02 MED ORDER — METOPROLOL TARTRATE 25 MG PO TABS: 12.5000 mg | ORAL_TABLET | Freq: Two times a day (BID) | ORAL | Status: DC

## 2014-02-02 NOTE — Telephone Encounter (Signed)
Patient states that he would like to keep the Wednesday appointment with Dr. Paulla Dolly so he can check his toe until Fridays appt with the vascular do.

## 2014-02-02 NOTE — Progress Notes (Signed)
HPI Patient is a 73 yo who I saw for the first time last Dec.  He lived in [place] and moved to [place].  He has an extensive history of CAD and PVOD.  Followed by Dr Jodi Mourning at [facility].  Hx of afib  Hx of CAD (6 stents, last in July 2014) He also has a history of extensive PVOD  Followed by Dr Georgina Quint.   SInce I saw him he has been seen by Rosalia Hammers. He is now having a problem with a sore on his R foot  Has appt with C Scot Dock this weak He denies CP  Breathing is OK     Allergies  Allergen Reactions  . Zocor [Simvastatin] Hives    Current Outpatient Prescriptions  Medication Sig Dispense Refill  . acetaminophen (TYLENOL) 500 MG tablet Take 500 mg by mouth every 6 (six) hours as needed for pain.    [name] albuterol (PROVENTIL HFA;VENTOLIN HFA) 108 (90 BASE) MCG/ACT inhaler Inhale 2 puffs into the lungs every 6 (six) hours as needed for wheezing or shortness of breath. 1 Inhaler 2  . allopurinol (ZYLOPRIM) 300 MG tablet Take 1 tablet (300 mg total) by mouth every morning. 90 tablet 3  . amoxicillin-clavulanate (AUGMENTIN) 875-125 MG per tablet Take 1 tablet by mouth 2 (two) times daily. 20 tablet 0  . enalapril (VASOTEC) 10 MG tablet Take 1 tablet (10 mg total) by mouth daily. 90 tablet 3  . EPINEPHrine (EPI-PEN) 0.3 mg/0.3 mL SOAJ injection Inject 0.3 mLs (0.3 mg total) into the muscle once. 1 Device 1  . ferrous fumarate (HEMOCYTE - 106 MG FE) 325 (106 FE) MG TABS tablet Take 1 tablet by mouth every evening.    . furosemide (LASIX) 40 MG tablet Take 1 tablet (40 mg total) by mouth daily. 30 tablet 3  . linezolid (ZYVOX) 600 MG tablet Take 1 tablet (600 mg total) by mouth 2 (two) times daily. 20 tablet 0  . lovastatin (MEVACOR) 20 MG tablet Take 1 tablet (20 mg total) by mouth at bedtime. 90 tablet 2  . metoprolol tartrate (LOPRESSOR) 25 MG tablet Take 1 tablet (25 mg total) by mouth 2 (two) times daily. 60 tablet 6  . Multiple Vitamins-Minerals (MENS MULTIVITAMIN PLUS PO) Take  1 tablet by mouth daily.    . nitroGLYCERIN (NITROSTAT) 0.4 MG SL tablet Place 1 tablet (0.4 mg total) under the tongue every 5 (five) minutes as needed for chest pain. 90 tablet 3  . polyethylene glycol powder (GLYCOLAX/MIRALAX) powder Take 17 g by mouth 2 (two) times daily as needed. 527 g 3  . potassium chloride SA (K-DUR,KLOR-CON) 20 MEQ tablet Take 20 mEq by mouth daily.    . potassium chloride SA (KLOR-CON M20) 20 MEQ tablet Take 20 mEq by mouth daily.    . tamsulosin (FLOMAX) 0.4 MG CAPS capsule TAKE ONE CAPSULE BY MOUTH EVERY DAY    . warfarin (COUMADIN) 5 MG tablet Take 1 tablet (5 mg total) by mouth daily. 90 tablet 3  . zoster vaccine live, PF, (ZOSTAVAX) 78295 UNT/0.65ML injection Inject 19,400 Units into the skin once. 1 each 0   No current facility-administered medications for this visit.    Past Medical History  Diagnosis Date  . Myocardial infarction   . Hyperlipidemia   . Hypertension   . COPD (chronic obstructive pulmonary disease)   . Peripheral artery disease   . Atrial fibrillation   . Chronic pain     Past Surgical History  Procedure Laterality  Date  . Hernia repair    . Femoral-popliteal bypass graft    . Percutaneous coronary stent intervention (pci-s)    . Back surgery    . Ankle surgery      Family History  Problem Relation Age of Onset  . Diabetes Mother   . Cancer Mother   . Heart disease Mother   . Hyperlipidemia Mother   . Hypertension Mother   . Cancer Father   . Cancer Brother   . Heart disease Brother   . Depression Brother   . Early death Brother   . Hyperlipidemia Brother   . Hypertension Brother   . Alcohol abuse Sister   . Heart disease Sister   . Hyperlipidemia Sister   . Hypertension Sister   . Stroke Sister   . Heart disease Maternal Grandmother   . Kidney disease Maternal Grandmother   . Heart disease Maternal Grandfather   . Kidney disease Maternal Grandfather     History   Social History  . Marital Status: Married     Spouse Name: N/A    Number of Children: N/A  . Years of Education: N/A   Occupational History  . Not on file.   Social History Main Topics  . Smoking status: Former Smoker    Types: Cigarettes    Quit date: 07/22/1998  . Smokeless tobacco: Current User    Types: Snuff  . Alcohol Use: No  . Drug Use: No  . Sexual Activity: No   Other Topics Concern  . Not on file   Social History Narrative    Review of Systems:  All systems reviewed.  They are negative to the above problem except as previously stated.  Vital Signs: BP 142/78 mmHg  Pulse 55  Ht 6\' 4"  (1.93 m)  Wt 300 lb (136.079 kg)  BMI 36.53 kg/m2  Physical Exam Patient is a morbidly obese 73 yo in NAD HEENT:  Normocephalic, atraumatic. EOMI, PERRLA.  Neck: JVP is normal.  No bruits.  Lungs: clear to auscultation. No rales no wheezes. COurse cough Heart: Regular rate and rhythm. Normal S1, S2. No S3.   No significant murmurs. PMI not displaced.  Abdomen:  Supple.  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  Extremities:  Tr edema.  Chronic stasis changes.  Musculoskeletal :moving all extremities.  Neuro:   alert and oriented x3.  CN II-XII grossly intact.  EKG Afib  55  Occasional PVC Assessment and Plan:  1.  CAD  I am not convinced of active ischemia  WIll Get echo.  2.  PVOD  I did not take dressing off foot  Has appt later this weak to addreass  WIll review echo for preop eval  3.  Afib  WIll cut back on metoprolol to 2x per day  Continue coumadin.     4.  HTN  Fair control  Continue meds  5.  HL   Continue meds.

## 2014-02-02 NOTE — Patient Instructions (Addendum)
Your physician recommends that you schedule a follow-up appointment in: March with Dr. Harrington Challenger   Your physician has requested that you have an echocardiogram. Echocardiography is a painless test that uses sound waves to create images of your heart. It provides your doctor with information about the size and shape of your heart and how well your heart's chambers and valves are working. This procedure takes approximately one hour. There are no restrictions for this procedure.   Your physician has recommended you make the following change in your medication:  1) Decrease Metoprolol to 12.5 mg twice daily

## 2014-02-04 ENCOUNTER — Ambulatory Visit: Payer: Medicare Other | Admitting: Podiatry

## 2014-02-04 ENCOUNTER — Ambulatory Visit (INDEPENDENT_AMBULATORY_CARE_PROVIDER_SITE_OTHER): Payer: Medicare Other | Admitting: Podiatry

## 2014-02-04 ENCOUNTER — Encounter: Payer: Self-pay | Admitting: Podiatry

## 2014-02-04 ENCOUNTER — Other Ambulatory Visit (HOSPITAL_COMMUNITY): Payer: Medicare Other

## 2014-02-04 VITALS — BP 144/78 | HR 74 | Resp 16

## 2014-02-04 DIAGNOSIS — L97511 Non-pressure chronic ulcer of other part of right foot limited to breakdown of skin: Secondary | ICD-10-CM

## 2014-02-04 DIAGNOSIS — M79674 Pain in right toe(s): Secondary | ICD-10-CM

## 2014-02-04 DIAGNOSIS — I739 Peripheral vascular disease, unspecified: Secondary | ICD-10-CM

## 2014-02-04 DIAGNOSIS — I2510 Atherosclerotic heart disease of native coronary artery without angina pectoris: Secondary | ICD-10-CM

## 2014-02-04 MED ORDER — LINEZOLID 600 MG PO TABS: 600.0000 mg | ORAL_TABLET | Freq: Two times a day (BID) | ORAL | Status: DC

## 2014-02-05 ENCOUNTER — Encounter: Payer: Self-pay | Admitting: Vascular Surgery

## 2014-02-05 NOTE — Progress Notes (Signed)
Subjective:     Patient ID: Terry Martinez, male   DOB: 1940/08/17, 73 y.o.   MRN: [id number]  HPI patient presents with circulatory issues right and a damaged right hallux nail that is black and is being seen by the vascular doctor Thursday and he will most likely require amputation   Review of Systems     Objective:   Physical Exam No change neurovascular status noted with a dark and right hallux which is dramatically gotten worse in the last few days with no other history of changes    Assessment:     Damaged right hallux with probable gangrene going on and possible infection    Plan:     Reviewed condition and the fact he is going to lose his toe and at this time we are going to continue his Zyvox and he is going to see Dr. Oneida Alar on Thursday and will most likely have amputation ASAP. Unfortunately this is taken a turn for the worse over the last few days we'll need to be fixed

## 2014-02-06 ENCOUNTER — Ambulatory Visit (INDEPENDENT_AMBULATORY_CARE_PROVIDER_SITE_OTHER): Payer: Medicare Other | Admitting: Vascular Surgery

## 2014-02-06 ENCOUNTER — Telehealth: Payer: Self-pay | Admitting: *Deleted

## 2014-02-06 ENCOUNTER — Ambulatory Visit (HOSPITAL_COMMUNITY): Payer: Medicare Other | Attending: Cardiovascular Disease | Admitting: Radiology

## 2014-02-06 ENCOUNTER — Ambulatory Visit: Payer: Medicare Other

## 2014-02-06 ENCOUNTER — Encounter: Payer: Self-pay | Admitting: Vascular Surgery

## 2014-02-06 VITALS — BP 151/66 | HR 104 | Ht 76.0 in | Wt 297.0 lb

## 2014-02-06 DIAGNOSIS — L97909 Non-pressure chronic ulcer of unspecified part of unspecified lower leg with unspecified severity: Secondary | ICD-10-CM | POA: Diagnosis not present

## 2014-02-06 DIAGNOSIS — I2510 Atherosclerotic heart disease of native coronary artery without angina pectoris: Secondary | ICD-10-CM | POA: Diagnosis present

## 2014-02-06 DIAGNOSIS — R0602 Shortness of breath: Secondary | ICD-10-CM | POA: Insufficient documentation

## 2014-02-06 DIAGNOSIS — I70299 Other atherosclerosis of native arteries of extremities, unspecified extremity: Secondary | ICD-10-CM

## 2014-02-06 DIAGNOSIS — I4891 Unspecified atrial fibrillation: Secondary | ICD-10-CM | POA: Diagnosis not present

## 2014-02-06 DIAGNOSIS — I25119 Atherosclerotic heart disease of native coronary artery with unspecified angina pectoris: Secondary | ICD-10-CM

## 2014-02-06 NOTE — Progress Notes (Signed)
 [facility] [place]  Patient name: Terry Martinez MRN: [id number] DOB: 04/23/1940 Sex: male  REASON FOR VISIT: Follow up of right foot wound  HPI: Terry Martinez is a 73 y.o. male who I saw in consultation on 11/05/2013. He underwent a left axillobifemoral bypass graft in [place] 4 years ago. He had developed a wound on his right great toe. Workup in [place] had revealed that his bypass graft was patent. He had some infrainguinal arterial occlusive disease bilaterally. On the right side he had ABI of 89%. On the left side ABI was 90%. The time of his last visit the right great toe wound was healing. I plan on seeing him back in 6 months.   He returns today because the right great toe wound has worsened. He reportedly had an x-ray done which showed evidence of osteomyelitis. He denies fever or chills. He denies significant claudication although his activities fairly limited.   Past Medical History  Diagnosis Date  . Myocardial infarction   . Hyperlipidemia   . Hypertension   . COPD (chronic obstructive pulmonary disease)   . Peripheral artery disease   . Atrial fibrillation   . Chronic pain    Family History  Problem Relation Age of Onset  . Diabetes Mother   . Cancer Mother   . Heart disease Mother   . Hyperlipidemia Mother   . Hypertension Mother   . Cancer Father   . Cancer Brother   . Heart disease Brother   . Depression Brother   . Early death Brother   . Hyperlipidemia Brother   . Hypertension Brother   . Alcohol abuse Sister   . Heart disease Sister   . Hyperlipidemia Sister   . Hypertension Sister   . Stroke Sister   . Heart disease Maternal Grandmother   . Kidney disease Maternal Grandmother   . Heart disease Maternal Grandfather   . Kidney disease Maternal Grandfather    SOCIAL HISTORY: History  Substance Use Topics  . Smoking status: Former Smoker    Types: Cigarettes    Quit date: 07/22/1998  . Smokeless  tobacco: Current User    Types: Snuff  . Alcohol Use: No   Allergies  Allergen Reactions  . Zocor [Simvastatin] Hives   Current Outpatient Prescriptions  Medication Sig Dispense Refill  . acetaminophen (TYLENOL) 500 MG tablet Take 500 mg by mouth every 6 (six) hours as needed for pain.    . albuterol (PROVENTIL HFA;VENTOLIN HFA) 108 (90 BASE) MCG/ACT inhaler Inhale 2 puffs into the lungs every 6 (six) hours as needed for wheezing or shortness of breath. 1 Inhaler 2  . allopurinol (ZYLOPRIM) 300 MG tablet Take 1 tablet (300 mg total) by mouth every morning. 90 tablet 3  . amoxicillin-clavulanate (AUGMENTIN) 875-125 MG per tablet Take 1 tablet by mouth 2 (two) times daily. 20 tablet 0  . enalapril (VASOTEC) 10 MG tablet Take 1 tablet (10 mg total) by mouth daily. 90 tablet 3  . EPINEPHrine (EPI-PEN) 0.3 mg/0.3 mL SOAJ injection Inject 0.3 mLs (0.3 mg total) into the muscle once. 1 Device 1  . ferrous fumarate (HEMOCYTE - 106 MG FE) 325 (106 FE) MG TABS tablet Take 1 tablet by mouth every evening.    . furosemide (LASIX) 40 MG tablet Take 1 tablet (40 mg total) by mouth daily. 30 tablet 3  . linezolid (ZYVOX) 600 MG tablet Take 1 tablet (600 mg total) by mouth 2 (two) times daily. 20 tablet 0  .   lovastatin (MEVACOR) 20 MG tablet Take 1 tablet (20 mg total) by mouth at bedtime. 90 tablet 2  . metoprolol tartrate (LOPRESSOR) 25 MG tablet Take 0.5 tablets (12.5 mg total) by mouth 2 (two) times daily. 60 tablet 6  . Multiple Vitamins-Minerals (MENS MULTIVITAMIN PLUS PO) Take 1 tablet by mouth daily.    . nitroGLYCERIN (NITROSTAT) 0.4 MG SL tablet Place 1 tablet (0.4 mg total) under the tongue every 5 (five) minutes as needed for chest pain. 90 tablet 3  . polyethylene glycol powder (GLYCOLAX/MIRALAX) powder Take 17 g by mouth 2 (two) times daily as needed. 527 g 3  . potassium chloride SA (K-DUR,KLOR-CON) 20 MEQ tablet Take 20 mEq by mouth daily.    . potassium chloride SA (KLOR-CON M20) 20 MEQ  tablet Take 20 mEq by mouth daily.    . tamsulosin (FLOMAX) 0.4 MG CAPS capsule TAKE ONE CAPSULE BY MOUTH EVERY DAY    . warfarin (COUMADIN) 5 MG tablet Take 1 tablet (5 mg total) by mouth daily. 90 tablet 3  . zoster vaccine live, PF, (ZOSTAVAX) 19400 UNT/0.65ML injection Inject 19,400 Units into the skin once. 1 each 0   No current facility-administered medications for this visit.   REVIEW OF SYSTEMS: [X ] denotes positive finding; [  ] denotes negative finding  CARDIOVASCULAR:  [ ] chest pain   [ ] chest pressure   [ ] palpitations   [ ] orthopnea   [ ] dyspnea on exertion   [ ] claudication   [ ] rest pain   [ ] DVT   [ ] phlebitis PULMONARY:   [ ] productive cough   [ ] asthma   [ ] wheezing NEUROLOGIC:   [ ] weakness  [ ] paresthesias  [ ] aphasia  [ ] amaurosis  [ ] dizziness HEMATOLOGIC:   [ ] bleeding problems   [ ] clotting disorders MUSCULOSKELETAL:  [ ] joint pain   [ ] joint swelling [ ] leg swelling GASTROINTESTINAL: [ ]  blood in stool  [ ]  hematemesis GENITOURINARY:  [ ]  dysuria  [ ]  hematuria PSYCHIATRIC:  [ ] history of major depression INTEGUMENTARY:  [ ] rashes  [X ] ulcers CONSTITUTIONAL:  [ ] fever   [ ] chills  PHYSICAL EXAM: Filed Vitals:   02/06/14 1252  BP: 151/66  Pulse: 104  Height: 6' 4" (1.93 m)  Weight: 297 lb (134.718 kg)  SpO2: 95%   Body mass index is 36.17 kg/(m^2). GENERAL: The patient is a well-nourished male, in no acute distress. The vital signs are documented above. CARDIOVASCULAR: There is a regular rate and rhythm. I cannot palpate femoral, popliteal, or pedal pulses. He does have early brisk dorsalis pedis and posterior tibial signals bilaterally although they are monophasic. PULMONARY: There is good air exchange bilaterally without wheezing or rales. ABDOMEN: Soft and non-tender with normal pitched bowel sounds.  MUSCULOSKELETAL: There are no major deformities or cyanosis. NEUROLOGIC: No focal weakness or paresthesias are  detected. SKIN: he has a gangrenous right great toe. Currently there is no significant erythema or drainage. PSYCHIATRIC: The patient has a normal affect.  DATA:  I reviewed his x-ray of the right foot which was performed on 01/29/2014. This shows osteomyelitis of the proximal phalanx. 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  MEDICAL ISSUES:  Atherosclerosis of artery of extremity with ulceration This patient has a nonhealing wound of the right great toe. He has   had a previous left axillobifemoral bypass graft in [place]. His most recent study there showed that the graft was patent although I do not have this study. He also tells me that he's had stents before in the graft although I think more likely had an infrainguinal stent. Regardless, given that the wound on the right great toe is not healing and that the x-ray shows osteomyelitis, he will require ray amputation of the right great toe. I have explained that I am concerned that he may not have adequate circulation to heal a toe amputation and that if this does not heal adequately we would need to proceed with arteriography to further assess him for possible infrainguinal bypass. If he does require arteriography I think we should get a duplex of his axillobifemoral bypass graft to be sure that this is patent also. Because of his size this is difficult to assess on physical exam.  His surgery is scheduled for 02/19/2014. Bulky overnight and have physical therapy teach him how to walk with a Darco shoe. We have discussed the procedure and potential complications including the risk of nonhealing.    DICKSON,CHRISTOPHER S [facility] [place] [facility]: [phone]    

## 2014-02-06 NOTE — Assessment & Plan Note (Signed)
This patient has a nonhealing wound of the right great toe. He has had a previous left axillobifemoral bypass graft in [facility]. His most recent study there showed that the graft was patent although I do not have this study. He also tells me that he's had stents before in the graft although I think more likely had an infrainguinal stent. Regardless, given that the wound on the right great toe is not healing and that the x-ray shows osteomyelitis, he will require ray amputation of the right great toe. I have explained that I am concerned that he may not have adequate circulation to heal a toe amputation and that if this does not heal adequately we would need to proceed with arteriography to further assess him for possible infrainguinal bypass. If he does require arteriography I think we should get a duplex of his axillobifemoral bypass graft to be sure that this is patent also. Because of his size this is difficult to assess on physical exam.  His surgery is scheduled for 02/19/2014. Bulky overnight and have physical therapy teach him how to walk with a Darco shoe. We have discussed the procedure and potential complications including the risk of nonhealing.

## 2014-02-06 NOTE — Telephone Encounter (Signed)
Per Dr. Lacinda Axon, patient is to STOP coumadin on 02/14/2014 and then needs to come in for an INR check&Lovenox teaching on 02/16/2014

## 2014-02-06 NOTE — Progress Notes (Signed)
Echocardiogram performed.  

## 2014-02-09 ENCOUNTER — Telehealth: Payer: Self-pay

## 2014-02-09 ENCOUNTER — Ambulatory Visit: Payer: Medicare Other

## 2014-02-09 NOTE — Telephone Encounter (Signed)
Spoke with patient and gave message.

## 2014-02-09 NOTE — Telephone Encounter (Signed)
-----   Message from Coral Spikes, DO sent at 02/09/2014  8:23 AM EST ----- Yes. Patient will need a bridge.  He is to stop on the 26th with a INR on Monday (12/28). Will will start the Lovenox at that time.  Thanks  [place] PGY-3 Pager #: [phone]

## 2014-02-11 ENCOUNTER — Other Ambulatory Visit: Payer: Self-pay

## 2014-02-16 ENCOUNTER — Ambulatory Visit (INDEPENDENT_AMBULATORY_CARE_PROVIDER_SITE_OTHER): Payer: Medicare Other | Admitting: *Deleted

## 2014-02-16 ENCOUNTER — Other Ambulatory Visit: Payer: Self-pay | Admitting: Family Medicine

## 2014-02-16 DIAGNOSIS — Z7901 Long term (current) use of anticoagulants: Secondary | ICD-10-CM

## 2014-02-16 DIAGNOSIS — I4891 Unspecified atrial fibrillation: Secondary | ICD-10-CM

## 2014-02-16 LAB — POCT INR: INR: 1.6

## 2014-02-16 MED ORDER — ENOXAPARIN SODIUM 150 MG/ML ~~LOC~~ SOLN: 130.0000 mg | Freq: Two times a day (BID) | SUBCUTANEOUS | Status: DC

## 2014-02-18 ENCOUNTER — Encounter (HOSPITAL_COMMUNITY): Payer: Self-pay | Admitting: *Deleted

## 2014-02-18 MED ORDER — SODIUM CHLORIDE 0.9 % IV SOLN
INTRAVENOUS | Status: DC
Start: 2014-02-18 — End: 2014-02-19

## 2014-02-18 MED ORDER — DEXTROSE 5 % IV SOLN
1.5000 g | INTRAVENOUS | Status: AC
  Administered 2014-02-19: 1.5 g via INTRAVENOUS
  Filled 2014-02-18: qty 1.5

## 2014-02-18 MED ORDER — CHLORHEXIDINE GLUCONATE 4 % EX LIQD
60.0000 mL | Freq: Once | CUTANEOUS | Status: DC
  Filled 2014-02-18: qty 60

## 2014-02-18 NOTE — Progress Notes (Signed)
Spoke with pt's sister, Terry Martinez for pre-op call. She was able to verify allergies, meds, medical and surgical history. She was given pre-op instructions and voiced understanding.

## 2014-02-18 NOTE — Progress Notes (Signed)
   02/18/14 1119  OBSTRUCTIVE SLEEP APNEA  Have you ever been diagnosed with sleep apnea through a sleep study? No  Do you snore loudly (loud enough to be heard through closed doors)?  1  Do you often feel tired, fatigued, or sleepy during the daytime? 1  Has anyone observed you stop breathing during your sleep? 0  Do you have, or are you being treated for high blood pressure? 1  BMI more than 35 kg/m2? 1  Age over 73 years old? 1  Neck circumference greater than 40 cm/16 inches? 1  Gender: 1  Obstructive Sleep Apnea Score 7  Score 4 or greater  Results sent to PCP

## 2014-02-19 ENCOUNTER — Inpatient Hospital Stay (HOSPITAL_COMMUNITY): Payer: Medicare Other | Admitting: Critical Care Medicine

## 2014-02-19 ENCOUNTER — Inpatient Hospital Stay (HOSPITAL_COMMUNITY)
Admission: RE | Admit: 2014-02-19 | Discharge: 2014-02-21 | DRG: 256 | Disposition: A | Discharge status: Home Health | Payer: Medicare Other | Source: Ambulatory Visit | Attending: Vascular Surgery | Admitting: Vascular Surgery

## 2014-02-19 ENCOUNTER — Encounter (HOSPITAL_COMMUNITY): Payer: Self-pay | Admitting: Surgery

## 2014-02-19 ENCOUNTER — Telehealth: Payer: Self-pay | Admitting: Vascular Surgery

## 2014-02-19 ENCOUNTER — Encounter (HOSPITAL_COMMUNITY)
Admission: RE | Disposition: A | Discharge status: Home Health | Payer: Self-pay | Source: Ambulatory Visit | Attending: Vascular Surgery

## 2014-02-19 DIAGNOSIS — E785 Hyperlipidemia, unspecified: Secondary | ICD-10-CM | POA: Diagnosis present

## 2014-02-19 DIAGNOSIS — Z85828 Personal history of other malignant neoplasm of skin: Secondary | ICD-10-CM

## 2014-02-19 DIAGNOSIS — M869 Osteomyelitis, unspecified: Secondary | ICD-10-CM | POA: Diagnosis present

## 2014-02-19 DIAGNOSIS — J449 Chronic obstructive pulmonary disease, unspecified: Secondary | ICD-10-CM | POA: Diagnosis present

## 2014-02-19 DIAGNOSIS — M109 Gout, unspecified: Secondary | ICD-10-CM | POA: Diagnosis present

## 2014-02-19 DIAGNOSIS — I10 Essential (primary) hypertension: Secondary | ICD-10-CM | POA: Diagnosis present

## 2014-02-19 DIAGNOSIS — Z7901 Long term (current) use of anticoagulants: Secondary | ICD-10-CM | POA: Diagnosis not present

## 2014-02-19 DIAGNOSIS — I2510 Atherosclerotic heart disease of native coronary artery without angina pectoris: Secondary | ICD-10-CM | POA: Diagnosis present

## 2014-02-19 DIAGNOSIS — D62 Acute posthemorrhagic anemia: Secondary | ICD-10-CM | POA: Diagnosis not present

## 2014-02-19 DIAGNOSIS — I252 Old myocardial infarction: Secondary | ICD-10-CM

## 2014-02-19 DIAGNOSIS — I70261 Atherosclerosis of native arteries of extremities with gangrene, right leg: Principal | ICD-10-CM | POA: Diagnosis present

## 2014-02-19 DIAGNOSIS — I482 Chronic atrial fibrillation: Secondary | ICD-10-CM | POA: Diagnosis present

## 2014-02-19 DIAGNOSIS — G8929 Other chronic pain: Secondary | ICD-10-CM | POA: Diagnosis present

## 2014-02-19 DIAGNOSIS — M86571 Other chronic hematogenous osteomyelitis, right ankle and foot: Secondary | ICD-10-CM | POA: Diagnosis not present

## 2014-02-19 DIAGNOSIS — Z79899 Other long term (current) drug therapy: Secondary | ICD-10-CM | POA: Diagnosis not present

## 2014-02-19 DIAGNOSIS — Z87891 Personal history of nicotine dependence: Secondary | ICD-10-CM

## 2014-02-19 DIAGNOSIS — L97519 Non-pressure chronic ulcer of other part of right foot with unspecified severity: Secondary | ICD-10-CM | POA: Diagnosis present

## 2014-02-19 DIAGNOSIS — I739 Peripheral vascular disease, unspecified: Secondary | ICD-10-CM | POA: Diagnosis not present

## 2014-02-19 DIAGNOSIS — I7025 Atherosclerosis of native arteries of other extremities with ulceration: Secondary | ICD-10-CM | POA: Diagnosis present

## 2014-02-19 HISTORY — DX: Pneumonia, unspecified organism: J18.9

## 2014-02-19 HISTORY — DX: Anemia, unspecified: D64.9

## 2014-02-19 HISTORY — DX: Family history of other specified conditions: Z84.89

## 2014-02-19 HISTORY — PX: AMPUTATION: SHX166

## 2014-02-19 LAB — POCT I-STAT 4, (NA,K, GLUC, HGB,HCT)
Glucose, Bld: 103 mg/dL — ABNORMAL HIGH (ref 70–99)
HCT: 36 % — ABNORMAL LOW (ref 39.0–52.0)
HEMOGLOBIN: 12.2 g/dL — AB (ref 13.0–17.0)
POTASSIUM: 4.4 mmol/L (ref 3.5–5.1)
Sodium: 140 mmol/L (ref 135–145)

## 2014-02-19 LAB — APTT: APTT: 37 s (ref 24–37)

## 2014-02-19 LAB — PROTIME-INR
INR: 1.31 (ref 0.00–1.49)
PROTHROMBIN TIME: 16.4 s — AB (ref 11.6–15.2)

## 2014-02-19 SURGERY — AMPUTATION, FOOT, RAY
Anesthesia: Monitor Anesthesia Care | Site: Toe | Laterality: Right

## 2014-02-19 MED ORDER — OXYCODONE HCL 5 MG PO TABS: 5.0000 mg | ORAL_TABLET | Freq: Once | ORAL | Status: DC | PRN

## 2014-02-19 MED ORDER — METOPROLOL TARTRATE 12.5 MG HALF TABLET
12.5000 mg | ORAL_TABLET | Freq: Two times a day (BID) | ORAL | Status: DC
  Administered 2014-02-19 – 2014-02-21 (×4): 12.5 mg via ORAL
  Filled 2014-02-19 (×6): qty 1

## 2014-02-19 MED ORDER — NITROGLYCERIN 0.4 MG SL SUBL: 0.4000 mg | SUBLINGUAL_TABLET | SUBLINGUAL | Status: DC | PRN

## 2014-02-19 MED ORDER — MIDAZOLAM HCL 5 MG/ML IJ SOLN: 1.0000 mg | Freq: Once | INTRAMUSCULAR | Status: DC

## 2014-02-19 MED ORDER — MORPHINE SULFATE 2 MG/ML IJ SOLN: 2.0000 mg | INTRAMUSCULAR | Status: DC | PRN

## 2014-02-19 MED ORDER — MIDAZOLAM HCL 2 MG/2ML IJ SOLN
INTRAMUSCULAR | Status: AC
Start: 2014-02-19 — End: 2014-02-19
  Administered 2014-02-19: 1 mg
  Filled 2014-02-19: qty 2

## 2014-02-19 MED ORDER — DOCUSATE SODIUM 100 MG PO CAPS
100.0000 mg | ORAL_CAPSULE | Freq: Every day | ORAL | Status: DC
  Administered 2014-02-20 – 2014-02-21 (×2): 100 mg via ORAL
  Filled 2014-02-19 (×2): qty 1

## 2014-02-19 MED ORDER — LACTATED RINGERS IV SOLN
INTRAVENOUS | Status: DC
  Administered 2014-02-19: 08:00:00 via INTRAVENOUS

## 2014-02-19 MED ORDER — FERROUS FUMARATE 325 (106 FE) MG PO TABS
1.0000 | ORAL_TABLET | Freq: Every evening | ORAL | Status: DC
  Administered 2014-02-19 – 2014-02-20 (×2): 106 mg via ORAL
  Filled 2014-02-19 (×4): qty 1

## 2014-02-19 MED ORDER — BUPIVACAINE-EPINEPHRINE (PF) 0.5% -1:200000 IJ SOLN
INTRAMUSCULAR | Status: DC | PRN
  Administered 2014-02-19: 15 mL via PERINEURAL

## 2014-02-19 MED ORDER — ONDANSETRON HCL 4 MG/2ML IJ SOLN
INTRAMUSCULAR | Status: AC
  Filled 2014-02-19: qty 2

## 2014-02-19 MED ORDER — HYDRALAZINE HCL 20 MG/ML IJ SOLN: 5.0000 mg | INTRAMUSCULAR | Status: DC | PRN

## 2014-02-19 MED ORDER — OXYCODONE-ACETAMINOPHEN 5-325 MG PO TABS: 1.0000 | ORAL_TABLET | ORAL | Status: DC | PRN

## 2014-02-19 MED ORDER — BISACODYL 10 MG RE SUPP
10.0000 mg | Freq: Every day | RECTAL | Status: DC | PRN
  Administered 2014-02-20 – 2014-02-21 (×2): 10 mg via RECTAL
  Filled 2014-02-19 (×2): qty 1

## 2014-02-19 MED ORDER — TAMSULOSIN HCL 0.4 MG PO CAPS
0.4000 mg | ORAL_CAPSULE | Freq: Every day | ORAL | Status: DC
  Administered 2014-02-19 – 2014-02-21 (×3): 0.4 mg via ORAL
  Filled 2014-02-19 (×3): qty 1

## 2014-02-19 MED ORDER — FENTANYL CITRATE 0.05 MG/ML IJ SOLN
INTRAMUSCULAR | Status: AC
  Filled 2014-02-19: qty 5

## 2014-02-19 MED ORDER — FUROSEMIDE 40 MG PO TABS
40.0000 mg | ORAL_TABLET | Freq: Every day | ORAL | Status: DC
  Administered 2014-02-19 – 2014-02-21 (×3): 40 mg via ORAL
  Filled 2014-02-19 (×3): qty 1

## 2014-02-19 MED ORDER — PHENYLEPHRINE 40 MCG/ML (10ML) SYRINGE FOR IV PUSH (FOR BLOOD PRESSURE SUPPORT)
PREFILLED_SYRINGE | INTRAVENOUS | Status: AC
  Filled 2014-02-19: qty 10

## 2014-02-19 MED ORDER — BACITRACIN ZINC 500 UNIT/GM EX OINT
TOPICAL_OINTMENT | CUTANEOUS | Status: DC | PRN
  Administered 2014-02-19: 1 via TOPICAL

## 2014-02-19 MED ORDER — GUAIFENESIN-DM 100-10 MG/5ML PO SYRP: 15.0000 mL | ORAL_SOLUTION | ORAL | Status: DC | PRN

## 2014-02-19 MED ORDER — POLYETHYLENE GLYCOL 3350 17 G PO PACK
17.0000 g | PACK | Freq: Two times a day (BID) | ORAL | Status: DC | PRN
  Filled 2014-02-19: qty 1

## 2014-02-19 MED ORDER — POLYETHYLENE GLYCOL 3350 17 GM/SCOOP PO POWD
17.0000 g | Freq: Two times a day (BID) | ORAL | Status: DC | PRN
  Filled 2014-02-19 (×2): qty 255

## 2014-02-19 MED ORDER — MIDAZOLAM HCL 2 MG/2ML IJ SOLN
INTRAMUSCULAR | Status: AC
  Filled 2014-02-19: qty 2

## 2014-02-19 MED ORDER — ALBUTEROL SULFATE (2.5 MG/3ML) 0.083% IN NEBU
3.0000 mL | INHALATION_SOLUTION | Freq: Four times a day (QID) | RESPIRATORY_TRACT | Status: DC | PRN
Start: 2014-02-19 — End: 2014-02-21

## 2014-02-19 MED ORDER — FENTANYL CITRATE 0.05 MG/ML IJ SOLN
INTRAMUSCULAR | Status: AC
  Filled 2014-02-19: qty 2

## 2014-02-19 MED ORDER — ALLOPURINOL 300 MG PO TABS
300.0000 mg | ORAL_TABLET | Freq: Every morning | ORAL | Status: DC
  Administered 2014-02-20 – 2014-02-21 (×2): 300 mg via ORAL
  Filled 2014-02-19 (×3): qty 1

## 2014-02-19 MED ORDER — ALUM & MAG HYDROXIDE-SIMETH 200-200-20 MG/5ML PO SUSP: 15.0000 mL | ORAL | Status: DC | PRN

## 2014-02-19 MED ORDER — DEXTROSE 5 % IV SOLN
1.5000 g | Freq: Two times a day (BID) | INTRAVENOUS | Status: AC
  Administered 2014-02-19 – 2014-02-20 (×2): 1.5 g via INTRAVENOUS
  Filled 2014-02-19 (×2): qty 1.5

## 2014-02-19 MED ORDER — ENOXAPARIN SODIUM 150 MG/ML ~~LOC~~ SOLN
130.0000 mg | Freq: Two times a day (BID) | SUBCUTANEOUS | Status: DC
  Administered 2014-02-19 – 2014-02-20 (×4): 130 mg via SUBCUTANEOUS
  Filled 2014-02-19 (×6): qty 1

## 2014-02-19 MED ORDER — PROPOFOL INFUSION 10 MG/ML OPTIME
INTRAVENOUS | Status: DC | PRN
  Administered 2014-02-19: 75 ug/kg/min via INTRAVENOUS

## 2014-02-19 MED ORDER — ONDANSETRON HCL 4 MG/2ML IJ SOLN
INTRAMUSCULAR | Status: DC | PRN
  Administered 2014-02-19: 4 mg via INTRAVENOUS

## 2014-02-19 MED ORDER — LABETALOL HCL 5 MG/ML IV SOLN
10.0000 mg | INTRAVENOUS | Status: DC | PRN
  Filled 2014-02-19: qty 4

## 2014-02-19 MED ORDER — FENTANYL CITRATE 0.05 MG/ML IJ SOLN: 25.0000 ug | INTRAMUSCULAR | Status: DC | PRN

## 2014-02-19 MED ORDER — 0.9 % SODIUM CHLORIDE (POUR BTL) OPTIME
TOPICAL | Status: DC | PRN
  Administered 2014-02-19: 1000 mL

## 2014-02-19 MED ORDER — PHENOL 1.4 % MT LIQD
1.0000 | OROMUCOSAL | Status: DC | PRN
  Filled 2014-02-19: qty 177

## 2014-02-19 MED ORDER — LIDOCAINE-EPINEPHRINE (PF) 1.5 %-1:200000 IJ SOLN
INTRAMUSCULAR | Status: DC | PRN
  Administered 2014-02-19: 15 mL via PERINEURAL

## 2014-02-19 MED ORDER — SODIUM CHLORIDE 0.9 % IV SOLN: INTRAVENOUS | Status: DC

## 2014-02-19 MED ORDER — ONDANSETRON HCL 4 MG/2ML IJ SOLN: 4.0000 mg | Freq: Four times a day (QID) | INTRAMUSCULAR | Status: DC | PRN

## 2014-02-19 MED ORDER — PRAVASTATIN SODIUM 20 MG PO TABS
20.0000 mg | ORAL_TABLET | Freq: Every day | ORAL | Status: DC
  Administered 2014-02-19 – 2014-02-20 (×2): 20 mg via ORAL
  Filled 2014-02-19 (×4): qty 1

## 2014-02-19 MED ORDER — ACETAMINOPHEN 500 MG PO TABS
500.0000 mg | ORAL_TABLET | Freq: Four times a day (QID) | ORAL | Status: DC | PRN
  Administered 2014-02-20 – 2014-02-21 (×2): 500 mg via ORAL
  Filled 2014-02-19 (×2): qty 1

## 2014-02-19 MED ORDER — FENTANYL CITRATE 0.05 MG/ML IJ SOLN
50.0000 ug | Freq: Once | INTRAMUSCULAR | Status: AC
  Administered 2014-02-19: 50 ug via INTRAVENOUS

## 2014-02-19 MED ORDER — PANTOPRAZOLE SODIUM 40 MG PO TBEC
40.0000 mg | DELAYED_RELEASE_TABLET | Freq: Every day | ORAL | Status: DC
  Administered 2014-02-19 – 2014-02-21 (×3): 40 mg via ORAL
  Filled 2014-02-19 (×3): qty 1

## 2014-02-19 MED ORDER — POTASSIUM CHLORIDE CRYS ER 20 MEQ PO TBCR: 20.0000 meq | EXTENDED_RELEASE_TABLET | Freq: Every day | ORAL | Status: DC | PRN

## 2014-02-19 MED ORDER — METOPROLOL TARTRATE 1 MG/ML IV SOLN: 2.0000 mg | INTRAVENOUS | Status: DC | PRN

## 2014-02-19 MED ORDER — ENALAPRIL MALEATE 10 MG PO TABS
10.0000 mg | ORAL_TABLET | Freq: Every day | ORAL | Status: DC
  Administered 2014-02-19 – 2014-02-21 (×3): 10 mg via ORAL
  Filled 2014-02-19 (×3): qty 1

## 2014-02-19 MED ORDER — BACITRACIN ZINC 500 UNIT/GM EX OINT
TOPICAL_OINTMENT | CUTANEOUS | Status: AC
  Filled 2014-02-19: qty 15

## 2014-02-19 MED ORDER — OXYCODONE HCL 5 MG/5ML PO SOLN
5.0000 mg | Freq: Once | ORAL | Status: DC | PRN
Start: 2014-02-19 — End: 2014-02-19

## 2014-02-19 MED ORDER — PHENYLEPHRINE HCL 10 MG/ML IJ SOLN
INTRAMUSCULAR | Status: DC | PRN
  Administered 2014-02-19 (×2): 80 ug via INTRAVENOUS
  Administered 2014-02-19 (×2): 40 ug via INTRAVENOUS
  Administered 2014-02-19 (×3): 80 ug via INTRAVENOUS

## 2014-02-19 SURGICAL SUPPLY — 41 items
BANDAGE ELASTIC 4 VELCRO ST LF (GAUZE/BANDAGES/DRESSINGS) ×2 IMPLANT
BLADE LONG MED 31X9 (MISCELLANEOUS) ×2 IMPLANT
BNDG CONFORM 3 STRL LF (GAUZE/BANDAGES/DRESSINGS) ×2 IMPLANT
BNDG ESMARK 4X9 LF (GAUZE/BANDAGES/DRESSINGS) ×2 IMPLANT
BNDG GAUZE ELAST 4 BULKY (GAUZE/BANDAGES/DRESSINGS) ×2 IMPLANT
CANISTER SUCTION 2500CC (MISCELLANEOUS) ×2 IMPLANT
CLIP TI MEDIUM 6 (CLIP) ×2 IMPLANT
COVER SURGICAL LIGHT HANDLE (MISCELLANEOUS) ×2 IMPLANT
DRAPE EXTREMITY T 121X128X90 (DRAPE) ×2 IMPLANT
ELECT REM PT RETURN 9FT ADLT (ELECTROSURGICAL) ×2
ELECTRODE REM PT RTRN 9FT ADLT (ELECTROSURGICAL) ×1 IMPLANT
GAUZE SPONGE 4X4 12PLY STRL (GAUZE/BANDAGES/DRESSINGS) ×2 IMPLANT
GLOVE BIO SURGEON STRL SZ 6.5 (GLOVE) ×2 IMPLANT
GLOVE BIO SURGEON STRL SZ7.5 (GLOVE) ×2 IMPLANT
GLOVE BIOGEL PI IND STRL 6.5 (GLOVE) ×2 IMPLANT
GLOVE BIOGEL PI IND STRL 7.0 (GLOVE) ×2 IMPLANT
GLOVE BIOGEL PI IND STRL 8 (GLOVE) ×1 IMPLANT
GLOVE BIOGEL PI INDICATOR 6.5 (GLOVE) ×2
GLOVE BIOGEL PI INDICATOR 7.0 (GLOVE) ×2
GLOVE BIOGEL PI INDICATOR 8 (GLOVE) ×1
GLOVE ECLIPSE 6.5 STRL STRAW (GLOVE) ×2 IMPLANT
GOWN BRE IMP SLV AUR XL STRL (GOWN DISPOSABLE) ×2 IMPLANT
GOWN STRL REUS W/ TWL LRG LVL3 (GOWN DISPOSABLE) ×3 IMPLANT
GOWN STRL REUS W/TWL LRG LVL3 (GOWN DISPOSABLE) ×3
KIT BASIN OR (CUSTOM PROCEDURE TRAY) ×2 IMPLANT
KIT ROOM TURNOVER OR (KITS) ×2 IMPLANT
NS IRRIG 1000ML POUR BTL (IV SOLUTION) ×2 IMPLANT
PACK GENERAL/GYN (CUSTOM PROCEDURE TRAY) ×2 IMPLANT
PAD ARMBOARD 7.5X6 YLW CONV (MISCELLANEOUS) ×4 IMPLANT
SPECIMEN JAR SMALL (MISCELLANEOUS) ×2 IMPLANT
SPONGE GAUZE 4X4 12PLY STER LF (GAUZE/BANDAGES/DRESSINGS) ×2 IMPLANT
SUT ETHILON 3 0 PS 1 (SUTURE) ×4 IMPLANT
SUT SILK 3 0 (SUTURE) ×1
SUT SILK 3 0 SH CR/8 (SUTURE) ×2 IMPLANT
SUT SILK 3-0 18XBRD TIE 12 (SUTURE) ×1 IMPLANT
SUT VIC AB 3-0 SH 27 (SUTURE) ×1
SUT VIC AB 3-0 SH 27X BRD (SUTURE) ×1 IMPLANT
SWAB COLLECTION DEVICE MRSA (MISCELLANEOUS) IMPLANT
TUBE ANAEROBIC SPECIMEN COL (MISCELLANEOUS) IMPLANT
UNDERPAD 30X30 INCONTINENT (UNDERPADS AND DIAPERS) ×2 IMPLANT
WATER STERILE IRR 1000ML POUR (IV SOLUTION) ×2 IMPLANT

## 2014-02-19 NOTE — Transfer of Care (Signed)
Immediate Anesthesia Transfer of Care Note  Patient: Terry Martinez  Procedure(s) Performed: Procedure(s): AMPUTATION RAY-RIGHT GREAT TOE (Right)  Patient Location: PACU  Anesthesia Type:MAC combined with regional for post-op pain  Level of Consciousness: awake, alert  and oriented  Airway & Oxygen Therapy: Patient Spontanous Breathing and Patient connected to nasal cannula oxygen  Post-op Assessment: Report given to PACU RN, Post -op Vital signs reviewed and stable and Patient moving all extremities X 4  Post vital signs: Reviewed and stable  Complications: No apparent anesthesia complications

## 2014-02-19 NOTE — Anesthesia Preprocedure Evaluation (Addendum)
Anesthesia Evaluation  Patient identified by MRN, date of birth, ID band Patient awake    Reviewed: Allergy & Precautions, H&P , NPO status , Patient's Chart, lab work & pertinent test results, reviewed documented beta blocker date and time   History of Anesthesia Complications Negative for: history of anesthetic complications  Airway Mallampati: II  TM Distance: >3 FB Neck ROM: Full    Dental  (+) Dental Advisory Given, Edentulous Upper, Edentulous Lower   Pulmonary COPD COPD inhaler, former smoker,  breath sounds clear to auscultation        Cardiovascular hypertension, Pt. on medications and Pt. on home beta blockers + CAD, + Past MI and + Peripheral Vascular Disease + dysrhythmias Atrial Fibrillation Rhythm:Irregular  Echo 02/06/14 - - Left ventricle: The cavity size was mildly dilated. Systolic function was mildly reduced. The estimated ejection fraction wasin the range of 45% to 50%. Although no diagnostic regional wallmotion abnormality was identified, this possibility cannot be completely excluded on the basis of this study. - Aortic valve: There was mild to moderate regurgitation directed eccentrically in the LVOT. - Mitral valve: Calcified annulus. There was mild to moderateregurgitation directed centrally. - Left atrium: The atrium was moderately dilated. - Right ventricle: The cavity size was moderately dilated. Systolic function was moderately reduced. - Right atrium: The atrium was severely dilated. - Atrial septum: No defect or patent foramen ovale was identified. - Pulmonary arteries: Systolic pressure was mildly increased. PA peak pressure: 47 mm Hg (S).   Neuro/Psych  Headaches, negative psych ROS   GI/Hepatic   Endo/Other    Renal/GU Renal InsufficiencyRenal disease     Musculoskeletal   Abdominal   Peds  Hematology  (+) anemia ,   Anesthesia Other Findings   Reproductive/Obstetrics                            Anesthesia Physical Anesthesia Plan  ASA: III  Anesthesia Plan: MAC and Regional   Post-op Pain Management:    Induction: Intravenous  Airway Management Planned: Simple Face Mask and Natural Airway  Additional Equipment: None  Intra-op Plan:   Post-operative Plan:   Informed Consent: I have reviewed the patients History and Physical, chart, labs and discussed the procedure including the risks, benefits and alternatives for the proposed anesthesia with the patient or authorized representative who has indicated his/her understanding and acceptance.     Plan Discussed with: CRNA and Surgeon  Anesthesia Plan Comments:         Anesthesia Quick Evaluation

## 2014-02-19 NOTE — Telephone Encounter (Addendum)
-----   Message from Mena Goes, RN sent at 02/19/2014 11:40 AM EST ----- Regarding: Schedule   ----- Message -----    From: Angelia Mould, MD    Sent: 02/19/2014  11:29 AM      To: Vvs Charge Pool Subject: charge                                         PROCEDURE: Ray amputation of right great toe  SURGEON: Judeth [place]. Scot Dock, MD, FACS  ASSIST: Silva Bandy, [facility]  He will need a follow up visit in 3 weeks for removal of his sutures. Thank you. CD  notified patient of post op appointment on 03-11-14 at [facility] with suzanne

## 2014-02-19 NOTE — Consult Note (Signed)
PHARMACY NOTE  CONSULT :  Zinacef INDICATION :  Post-op antibiotic prophylaxis  ASSESSMENT:  Pharmacy consulted for renal adjustments, if needed, for post-op antibiotics x 24 hours..     Currently ordered Zinacef 1.5 gm IV q 12 hours x 2 doses.  .  Dosing Weight  135 kg,  Last SCr [prior to admission-11/24/13] 1.35,  estimated CrCl  > 70 ml/min  Currently ordered dose appropriate and requires no adjustments.  PLAN:  1. Continue Zinacef as previously ordered. 2. Pharmacy will Sign Off  given no adjustments in doses or schedules are anticipated.  There are alerts to indicate dramatic changes in renal function or clinical condition that might require dose or schedule adjustments.   Please re-consult if additional assistance is needed.  Thank you for allowing Pharmacy to participate in this patient's care   Estelle June,  Pharm.D. ,  02/19/2014,  12:54 PM

## 2014-02-19 NOTE — Interval H&P Note (Signed)
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 After consideration of risks, benefits and other options for treatment, the patient has consented to  Procedure(s): AMPUTATION RAY-RIGHT GREAT TOE (Right) as a surgical intervention .  The patient's history has been reviewed, patient examined, no change in status, stable for surgery.  I have reviewed the patient's chart and labs.  Questions were answered to the patient's satisfaction.     Tadeo Besecker S

## 2014-02-19 NOTE — Anesthesia Procedure Notes (Addendum)
Anesthesia Regional Block:  Popliteal block  Pre-Anesthetic Checklist: ,, timeout performed, Correct Patient, Correct Site, Correct Laterality, Correct Procedure, Correct Position, site marked, Risks and benefits discussed,  Surgical consent,  Pre-op evaluation,  At surgeon's request and post-op pain management  Laterality: Lower and Right  Prep: chloraprep       Needles:  Injection technique: Single-shot  Needle Type: Echogenic Stimulator Needle          Additional Needles:  Procedures: ultrasound guided (picture in chart) and nerve stimulator Popliteal block  Nerve Stimulator or Paresthesia:  Response: plantar, 0.7 mA,   Additional Responses:   Narrative:  Injection made incrementally with aspirations every 5 mL.  Performed by: Personally  Anesthesiologist: MOSER, CHRIS  Additional Notes: H+P and labs reviewed, risks and benefits discussed with patient, procedure tolerated well without complications   Procedure Name: MAC Date/Time: 02/19/2014 10:05 AM Performed by: Carola Frost Pre-anesthesia Checklist: Patient identified, Emergency Drugs available, Timeout performed, Patient being monitored and Suction available Patient Re-evaluated:Patient Re-evaluated prior to inductionOxygen Delivery Method: Simple face mask Intubation Type: IV induction Placement Confirmation: positive ETCO2 and breath sounds checked- equal and bilateral Dental Injury: Teeth and Oropharynx as per pre-operative assessment

## 2014-02-19 NOTE — Anesthesia Postprocedure Evaluation (Signed)
  Anesthesia Post-op Note  Patient: Terry Martinez  Procedure(s) Performed: Procedure(s): AMPUTATION RAY-RIGHT GREAT TOE (Right)  Patient Location: PACU  Anesthesia Type:MAC and Regional  Level of Consciousness: awake and alert   Airway and Oxygen Therapy: Patient Spontanous Breathing  Post-op Pain: none  Post-op Assessment: Post-op Vital signs reviewed, Patient's Cardiovascular Status Stable, Respiratory Function Stable, Patent Airway, No signs of Nausea or vomiting and Pain level controlled  Post-op Vital Signs: Reviewed and stable  Last Vitals:  Filed Vitals:   02/19/14 1329  BP: 132/51  Pulse:   Temp:   Resp:     Complications: No apparent anesthesia complications

## 2014-02-19 NOTE — H&P (View-Only) (Signed)
Vascular and Vein Specialist of [facility]  Patient name: Terry Martinez MRN: [id number] DOB: 1940-04-17 Sex: male  REASON FOR VISIT: Follow up of right foot wound  HPI: Terry Martinez is a 73 y.o. male who I saw in consultation on 11/05/2013. He underwent a left axillobifemoral bypass graft in [place] 4 years ago. He had developed a wound on his right great toe. Workup in [place] had revealed that his bypass graft was patent. He had some infrainguinal arterial occlusive disease bilaterally. On the right side he had ABI of 89%. On the left side ABI was 90%. The time of his last visit the right great toe wound was healing. I plan on seeing him back in 6 months.   He returns today because the right great toe wound has worsened. He reportedly had an x-ray done which showed evidence of osteomyelitis. He denies fever or chills. He denies significant claudication although his activities fairly limited.   Past Medical History  Diagnosis Date  . Myocardial infarction   . Hyperlipidemia   . Hypertension   . COPD (chronic obstructive pulmonary disease)   . Peripheral artery disease   . Atrial fibrillation   . Chronic pain    Family History  Problem Relation Age of Onset  . Diabetes Mother   . Cancer Mother   . Heart disease Mother   . Hyperlipidemia Mother   . Hypertension Mother   . Cancer Father   . Cancer Brother   . Heart disease Brother   . Depression Brother   . Early death Brother   . Hyperlipidemia Brother   . Hypertension Brother   . Alcohol abuse Sister   . Heart disease Sister   . Hyperlipidemia Sister   . Hypertension Sister   . Stroke Sister   . Heart disease Maternal Grandmother   . Kidney disease Maternal Grandmother   . Heart disease Maternal Grandfather   . Kidney disease Maternal Grandfather    SOCIAL HISTORY: History  Substance Use Topics  . Smoking status: Former Smoker    Types: Cigarettes    Quit date: 07/22/1998  . Smokeless  tobacco: Current User    Types: Snuff  . Alcohol Use: No   Allergies  Allergen Reactions  . Zocor [Simvastatin] Hives   Current Outpatient Prescriptions  Medication Sig Dispense Refill  . acetaminophen (TYLENOL) 500 MG tablet Take 500 mg by mouth every 6 (six) hours as needed for pain.    [name] albuterol (PROVENTIL HFA;VENTOLIN HFA) 108 (90 BASE) MCG/ACT inhaler Inhale 2 puffs into the lungs every 6 (six) hours as needed for wheezing or shortness of breath. 1 Inhaler 2  . allopurinol (ZYLOPRIM) 300 MG tablet Take 1 tablet (300 mg total) by mouth every morning. 90 tablet 3  . amoxicillin-clavulanate (AUGMENTIN) 875-125 MG per tablet Take 1 tablet by mouth 2 (two) times daily. 20 tablet 0  . enalapril (VASOTEC) 10 MG tablet Take 1 tablet (10 mg total) by mouth daily. 90 tablet 3  . EPINEPHrine (EPI-PEN) 0.3 mg/0.3 mL SOAJ injection Inject 0.3 mLs (0.3 mg total) into the muscle once. 1 Device 1  . ferrous fumarate (HEMOCYTE - 106 MG FE) 325 (106 FE) MG TABS tablet Take 1 tablet by mouth every evening.    . furosemide (LASIX) 40 MG tablet Take 1 tablet (40 mg total) by mouth daily. 30 tablet 3  . linezolid (ZYVOX) 600 MG tablet Take 1 tablet (600 mg total) by mouth 2 (two) times daily. 20 tablet 0  .  lovastatin (MEVACOR) 20 MG tablet Take 1 tablet (20 mg total) by mouth at bedtime. 90 tablet 2  . metoprolol tartrate (LOPRESSOR) 25 MG tablet Take 0.5 tablets (12.5 mg total) by mouth 2 (two) times daily. 60 tablet 6  . Multiple Vitamins-Minerals (MENS MULTIVITAMIN PLUS PO) Take 1 tablet by mouth daily.    . nitroGLYCERIN (NITROSTAT) 0.4 MG SL tablet Place 1 tablet (0.4 mg total) under the tongue every 5 (five) minutes as needed for chest pain. 90 tablet 3  . polyethylene glycol powder (GLYCOLAX/MIRALAX) powder Take 17 g by mouth 2 (two) times daily as needed. 527 g 3  . potassium chloride SA (K-DUR,KLOR-CON) 20 MEQ tablet Take 20 mEq by mouth daily.    . potassium chloride SA (KLOR-CON M20) 20 MEQ  tablet Take 20 mEq by mouth daily.    . tamsulosin (FLOMAX) 0.4 MG CAPS capsule TAKE ONE CAPSULE BY MOUTH EVERY DAY    . warfarin (COUMADIN) 5 MG tablet Take 1 tablet (5 mg total) by mouth daily. 90 tablet 3  . zoster vaccine live, PF, (ZOSTAVAX) 97989 UNT/0.65ML injection Inject 19,400 Units into the skin once. 1 each 0   No current facility-administered medications for this visit.   REVIEW OF SYSTEMS: Valu.Nieves ] denotes positive finding; [  ] denotes negative finding  CARDIOVASCULAR:  [ ]  chest pain   [ ]  chest pressure   [ ]  palpitations   [ ]  orthopnea   [ ]  dyspnea on exertion   [ ]  claudication   [ ]  rest pain   [ ]  DVT   [ ]  phlebitis PULMONARY:   [ ]  productive cough   [ ]  asthma   [ ]  wheezing NEUROLOGIC:   [ ]  weakness  [ ]  paresthesias  [ ]  aphasia  [ ]  amaurosis  [ ]  dizziness HEMATOLOGIC:   [ ]  bleeding problems   [ ]  clotting disorders MUSCULOSKELETAL:  [ ]  joint pain   [ ]  joint swelling [ ]  leg swelling GASTROINTESTINAL: [ ]   blood in stool  [ ]   hematemesis GENITOURINARY:  [ ]   dysuria  [ ]   hematuria PSYCHIATRIC:  [ ]  history of major depression INTEGUMENTARY:  [ ]  rashes  Valu.Nieves ] ulcers CONSTITUTIONAL:  [ ]  fever   [ ]  chills  PHYSICAL EXAM: Filed Vitals:   02/06/14 1252  BP: 151/66  Pulse: 104  Height: 6\' 4"  (1.93 m)  Weight: 297 lb (134.718 kg)  SpO2: 95%   Body mass index is 36.17 kg/(m^2). GENERAL: The patient is a well-nourished male, in no acute distress. The vital signs are documented above. CARDIOVASCULAR: There is a regular rate and rhythm. I cannot palpate femoral, popliteal, or pedal pulses. He does have early brisk dorsalis pedis and posterior tibial signals bilaterally although they are monophasic. PULMONARY: There is good air exchange bilaterally without wheezing or rales. ABDOMEN: Soft and non-tender with normal pitched bowel sounds.  MUSCULOSKELETAL: There are no major deformities or cyanosis. NEUROLOGIC: No focal weakness or paresthesias are  detected. SKIN: he has a gangrenous right great toe. Currently there is no significant erythema or drainage. PSYCHIATRIC: The patient has a normal affect.  DATA:  I reviewed his x-ray of the right foot which was performed on 01/29/2014. This shows osteomyelitis of the proximal phalanx. 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  MEDICAL ISSUES:  Atherosclerosis of artery of extremity with ulceration This patient has a nonhealing wound of the right great toe. He has  had a previous left axillobifemoral bypass graft in [facility]. His most recent study there showed that the graft was patent although I do not have this study. He also tells me that he's had stents before in the graft although I think more likely had an infrainguinal stent. Regardless, given that the wound on the right great toe is not healing and that the x-ray shows osteomyelitis, he will require ray amputation of the right great toe. I have explained that I am concerned that he may not have adequate circulation to heal a toe amputation and that if this does not heal adequately we would need to proceed with arteriography to further assess him for possible infrainguinal bypass. If he does require arteriography I think we should get a duplex of his axillobifemoral bypass graft to be sure that this is patent also. Because of his size this is difficult to assess on physical exam.  His surgery is scheduled for 02/19/2014. Bulky overnight and have physical therapy teach him how to walk with a Darco shoe. We have discussed the procedure and potential complications including the risk of nonhealing.    [place] [facility] [place]: [phone]

## 2014-02-19 NOTE — Op Note (Signed)
    NAME: Terry Martinez   MRN: [id number] DOB: [birth year]    DATE OF OPERATION: 02/19/2014  PREOP DIAGNOSIS: osteomyelitis of right great toe  POSTOP DIAGNOSIS: same  PROCEDURE: Ray amputation of right great toe  SURGEON: Judeth Cornfield. Scot Dock, MD, FACS  ASSIST: Silva Bandy, [facility]  ANESTHESIA: Popliteal block   EBL: 50 cc  INDICATIONS: [name] is a 73 y.o. male who has undergone previous revascularization [facility] He presented with osteomyelitis of his right great toe. Ray amputation of the right great toe was recommended.  FINDINGS: there was excellent bleeding at the site. Of note the patient had been on Lovenox as his Coumadin was stopped.  TECHNIQUE: The patient was taken to the operating room after a popliteal block was performed by anesthesia. The right foot was prepped and draped in usual sterile fashion. Tourniquet had been placed on the calf. A tourniquet was inflated to 300 mmHg after the foot was exsanguinated with an Esmarch bandage.T A tennis racquet incision was made encompassing the right great toe. Dissection was carried down to the metatarsal which was dissected free circumferentially. The periosteum was elevated and then the bone divided using TPS saw. The entire great toe was removed. Hemostasis was obtained using electrocautery and 3-0 silk ties. The wound was irrigated. The deep layers closed with interrupted 3-0 Vicryl. The skin was closed with interrupted 3-0 nylon's. Sterile dressing was applied. The patient tolerated the procedure well and was transferred to the recovery room in stable condition. All needle and sponge counts were correct.  Deitra Mayo, MD, FACS Vascular and Vein Specialists of [facility]  DATE OF DICTATION:   02/19/2014

## 2014-02-20 LAB — CBC
HCT: 31.1 % — ABNORMAL LOW (ref 39.0–52.0)
Hemoglobin: 9.6 g/dL — ABNORMAL LOW (ref 13.0–17.0)
MCH: 29.8 pg (ref 26.0–34.0)
MCHC: 30.9 g/dL (ref 30.0–36.0)
MCV: 96.6 fL (ref 78.0–100.0)
Platelets: 91 10*3/uL — ABNORMAL LOW (ref 150–400)
RBC: 3.22 MIL/uL — AB (ref 4.22–5.81)
RDW: 16.8 % — AB (ref 11.5–15.5)
WBC: 5.2 10*3/uL (ref 4.0–10.5)

## 2014-02-20 LAB — BASIC METABOLIC PANEL
Anion gap: 8 (ref 5–15)
BUN: 21 mg/dL (ref 6–23)
CO2: 26 mmol/L (ref 19–32)
Calcium: 8.4 mg/dL (ref 8.4–10.5)
Chloride: 104 mEq/L (ref 96–112)
Creatinine, Ser: 1.29 mg/dL (ref 0.50–1.35)
GFR calc Af Amer: 62 mL/min — ABNORMAL LOW (ref >90)
GFR, EST NON AFRICAN AMERICAN: 53 mL/min — AB (ref >90)
Glucose, Bld: 115 mg/dL — ABNORMAL HIGH (ref 70–99)
Potassium: 4.5 mmol/L (ref 3.5–5.1)
Sodium: 138 mmol/L (ref 135–145)

## 2014-02-20 MED ORDER — WARFARIN - PHARMACIST DOSING INPATIENT
Freq: Every day | Status: DC
  Administered 2014-02-20: 18:00:00

## 2014-02-20 MED ORDER — WARFARIN SODIUM 7.5 MG PO TABS
7.5000 mg | ORAL_TABLET | Freq: Once | ORAL | Status: AC
  Administered 2014-02-20: 7.5 mg via ORAL
  Filled 2014-02-20: qty 1

## 2014-02-20 NOTE — Progress Notes (Signed)
ANTICOAGULATION CONSULT NOTE - Initial Consult  Pharmacy Consult for warfarin Indication: atrial fibrillation  Allergies  Allergen Reactions  . Zocor [Simvastatin] Hives    Patient Measurements: Height: 6\' 4"  (193 cm) Weight: 297 lb (134.718 kg) IBW/kg (Calculated) : 86.8  Vital Signs: Temp: 98.3 F (36.8 C) (01/01 0435) Temp Source: Oral (01/01 0435) BP: 114/67 mmHg (01/01 0435) Pulse Rate: 94 (01/01 0435)  Labs:  Recent Labs  02/19/14 0758 02/20/14 0351  HGB 12.2* 9.6*  HCT 36.0* 31.1*  PLT  --  91*  APTT 37  --   LABPROT 16.4*  --   INR 1.31  --   CREATININE  --  1.29    Estimated Creatinine Clearance: 76.5 mL/min (by C-G formula based on Cr of 1.29).   Medical History: Past Medical History  Diagnosis Date  . Hyperlipidemia   . Hypertension   . COPD (chronic obstructive pulmonary disease)   . Peripheral artery disease   . Atrial fibrillation   . Chronic pain   . Pneumonia   . Kidney stones   . Headache     occasional  . Cancer     squamous cell carcinoma - left arm  . Anemia     hx low iron  . Myocardial infarction     3 stents  . Umbilical hernia   . Family history of adverse reaction to anesthesia     sister has difficulty waking up    Medications:  Prescriptions prior to admission  Medication Sig Dispense Refill Last Dose  . allopurinol (ZYLOPRIM) 300 MG tablet Take 1 tablet (300 mg total) by mouth every morning. 90 tablet 3 02/19/2014 at 0500  . enalapril (VASOTEC) 10 MG tablet Take 1 tablet (10 mg total) by mouth daily. 90 tablet 3 02/19/2014 at 0500  . enoxaparin (LOVENOX) 150 MG/ML injection Inject 0.87 mLs (130 mg total) into the skin every 12 (twelve) hours. 14 mL 0 02/18/2014 at 2100  . ferrous fumarate (HEMOCYTE - 106 MG FE) 325 (106 FE) MG TABS tablet Take 1 tablet by mouth every evening.   Past Week at Unknown time  . furosemide (LASIX) 40 MG tablet Take 1 tablet (40 mg total) by mouth daily. 30 tablet 3 02/18/2014 at Unknown time   . linezolid (ZYVOX) 600 MG tablet Take 1 tablet (600 mg total) by mouth 2 (two) times daily. 20 tablet 0 Taking  . lovastatin (MEVACOR) 20 MG tablet Take 1 tablet (20 mg total) by mouth at bedtime. 90 tablet 2 02/16/2014  . metoprolol tartrate (LOPRESSOR) 25 MG tablet Take 0.5 tablets (12.5 mg total) by mouth 2 (two) times daily. 60 tablet 6 02/19/2014 at 0500  . Multiple Vitamins-Minerals (MENS MULTIVITAMIN PLUS PO) Take 1 tablet by mouth daily.   02/18/2014 at Unknown time  . nitroGLYCERIN (NITROSTAT) 0.4 MG SL tablet Place 1 tablet (0.4 mg total) under the tongue every 5 (five) minutes as needed for chest pain. 90 tablet 3 Taking  . polyethylene glycol powder (GLYCOLAX/MIRALAX) powder Take 17 g by mouth 2 (two) times daily as needed. 527 g 3 Past Month at Unknown time  . potassium chloride SA (K-DUR,KLOR-CON) 20 MEQ tablet Take 20 mEq by mouth daily.   02/18/2014 at Unknown time  . tamsulosin (FLOMAX) 0.4 MG CAPS capsule TAKE ONE CAPSULE BY MOUTH EVERY DAY   02/18/2014 at Unknown time  . warfarin (COUMADIN) 5 MG tablet Take 1 tablet (5 mg total) by mouth daily. 90 tablet 3 02/14/2014  . zoster vaccine live,  PF, (ZOSTAVAX) 97282 UNT/0.65ML injection Inject 19,400 Units into the skin once. 1 each 0 Taking  . acetaminophen (TYLENOL) 500 MG tablet Take 500 mg by mouth every 6 (six) hours as needed for pain.   More than a month at Unknown time  . albuterol (PROVENTIL HFA;VENTOLIN HFA) 108 (90 BASE) MCG/ACT inhaler Inhale 2 puffs into the lungs every 6 (six) hours as needed for wheezing or shortness of breath. 1 Inhaler 2 More than a month at Unknown time  . amoxicillin-clavulanate (AUGMENTIN) 875-125 MG per tablet Take 1 tablet by mouth 2 (two) times daily. (Patient not taking: Reported on 02/11/2014) 20 tablet 0 Completed Course at Unknown time  . EPINEPHrine (EPI-PEN) 0.3 mg/0.3 mL SOAJ injection Inject 0.3 mLs (0.3 mg total) into the muscle once. 1 Device 1 Unknown at Unknown time     Assessment: 68 yom s/p amputation of R great toe to resume coumadin today. Was already started on full dose therapeutic lovenox per MD. H/H is down and platelets are low but potentially related to surgery. INR was 1.31 as of 12/31.   Goal of Therapy:  INR 2-3 Monitor platelets by anticoagulation protocol: Yes   Plan:  1. Warfarin 7.5mg  PO x 1 tonight 2. Daily INR 3. Continue lovenox per MD  Nashea Chumney, Rande Lawman 02/20/2014,9:00 AM

## 2014-02-20 NOTE — Progress Notes (Addendum)
  Vascular and Vein Specialists Progress Note  02/20/2014 8:33 AM 1 Day Post-Op  Subjective:  Doing well today. Mild pain with amputation site.   Tmax 98.3 BP sys 110s-140s Afib rate 50s-90s 02 94% RA  Filed Vitals:   02/20/14 0435  BP: 114/67  Pulse: 94  Temp: 98.3 F (36.8 C)  Resp: 18    Physical Exam: General: resting in bed in NAD Incisions:  Right great toe amputation site clean with no active drainage. Tissue appears viable. Sutures intact. Redressed this am. Extremities:  Right foot is warm.  Cardiac: irregularly irregular Lungs: nonlabored  CBC    Component Value Date/Time   WBC 5.2 02/20/2014 0351   RBC 3.22* 02/20/2014 0351   HGB 9.6* 02/20/2014 0351   HCT 31.1* 02/20/2014 0351   PLT 91* 02/20/2014 0351   MCV 96.6 02/20/2014 0351   MCH 29.8 02/20/2014 0351   MCHC 30.9 02/20/2014 0351   RDW 16.8* 02/20/2014 0351    BMET    Component Value Date/Time   NA 138 02/20/2014 0351   K 4.5 02/20/2014 0351   CL 104 02/20/2014 0351   CO2 26 02/20/2014 0351   GLUCOSE 115* 02/20/2014 0351   BUN 21 02/20/2014 0351   CREATININE 1.29 02/20/2014 0351   CREATININE 1.32 11/24/2013 0900   CALCIUM 8.4 02/20/2014 0351   GFRNONAA 53* 02/20/2014 0351   GFRAA 62* 02/20/2014 0351    INR    Component Value Date/Time   INR 1.31 02/19/2014 0758   INR 1.6 02/16/2014 0952     Intake/Output Summary (Last 24 hours) at 02/20/14 0833 Last data filed at 02/20/14 0436  Gross per 24 hour  Intake    830 ml  Output   1625 ml  Net   -795 ml     Assessment:  74 y.o. male is s/p: ray amputation of right great toe   1 Day Post-Op  Plan: -Amputation site appears viable. Dressing changes twice daily.  -Chronic afib on coumadin:  Has been on lovenox 130mg  pre-operatively while coumadin held. Continue lovenox for two days. Restart coumadin today.  -Acute surgical blood loss anemia: Hgb 9.6 today. Asymptomatic, will monitor.  -PT today. Partial weightbearing heel only with  Darco shoe.  -Tylenol for prn pain. Patient prefers not to take narcotics if not necessary. -Dispo: when ambulating well. Likely home tomorrow.   Virgina Jock, PA-C [facility] Office: [phone] Pager: [phone] 02/20/2014 8:33 AM   Agree with above Home when clears PT use of Darco shoe.  Ruta Hinds, MD [facility] Special[facility] of [place] Office: [phone] Pager: [phone]

## 2014-02-20 NOTE — Evaluation (Signed)
Physical Therapy Evaluation Patient Details Name: Terry Martinez MRN: [id number] DOB: 04-Feb-1941 Today's Date: 02/20/2014   History of Present Illness  Adm 02/19/14 for Rt 1st ray amputation; per RN, when pt up 02/20/14 am he had profuse bleeding from foot; PA has been by and redressed PMHx- afib on Coumadin (prior to surgery has been using Lovenox); back surgery; numbness in hands x 7 yrs    Clinical Impression  Patient is s/p above surgery resulting in functional limitations due to the deficits listed below (see PT Problem List). Pt impulsive and unsteady with use of Darco shoe. Fatigued quickly with walking 20 ft, seated rest, and again 20 ft.  Patient will benefit from skilled PT to increase their independence and safety with mobility to allow discharge to the venue listed below.  Will need stair training 02/21/14 with PT prior to d/c.     Follow Up Recommendations Home health PT;Supervision for mobility/OOB    Equipment Recommendations  Rolling walker with 5" wheels    Recommendations for Other Services OT consult     Precautions / Restrictions Precautions Precautions: Fall Precaution Comments: denies falls however brother-in-law states near falls Required Braces or Orthoses: Other Brace/Splint Other Brace/Splint: Darko shoe Restrictions Weight Bearing Restrictions: Yes Other Position/Activity Restrictions: weight bear through Rt heel only (with Darko shoe)      Mobility  Bed Mobility Overal bed mobility: Needs Assistance Bed Mobility: Rolling;Sidelying to Sit Rolling: Supervision Sidelying to sit: Min assist       General bed mobility comments: rolled with cues; assist to come to sit despite simulating his home environment;  Transfers Overall transfer level: Needs assistance Equipment used: Rolling walker (2 wheeled) Transfers: Sit to/from Stand Sit to Stand: Min assist         General transfer comment: incr effort to come to stand due to incr burden on LLE due to  pain in RLE; assist for balance as transitions hands to the RW from the sitting surface  Ambulation/Gait Ambulation/Gait assistance: Min assist Ambulation Distance (Feet): 20 Feet (seated rest; 20 ft again) Assistive device: Rolling walker (2 wheeled) Gait Pattern/deviations: Step-to pattern;Trunk flexed;Wide base of support     General Gait Details: pt moves impulsively; educated to perform step-to pattern with RLE lead and LLE to stay behind/not step even with RLE. When pt steps too far forward with LLE, the Rt Darko shoe tips forward and throws pt off balance; despite multimodal cues, pt with difficulty maintaining RLE in front  Stairs            Wheelchair Mobility    Modified Rankin (Stroke Patients Only)       Balance Overall balance assessment: Needs assistance         Standing balance support: Bilateral upper extremity supported Standing balance-Leahy Scale: Poor                               Pertinent Vitals/Pain Pain Assessment: 0-10 Pain Score: 5  Pain Location: Rt foot Pain Intervention(s): Limited activity within patient's tolerance;Monitored during session;Patient requesting pain meds-RN notified;RN gave pain meds during session;Repositioned    Home Living Family/patient expects to be discharged to:: Private residence Living Arrangements: Other relatives (sister and brother-in-law) Available Help at Discharge: Family;Available 24 hours/day Type of Home: House Home Access: Stairs to enter Entrance Stairs-Rails: None Entrance Stairs-Number of Steps: 2 Home Layout: One level Home Equipment: Cane - single point (comfort height toilet)  Prior Function Level of Independence: Independent with assistive device(s)         Comments: used cane PTAj     Hand Dominance        Extremity/Trunk Assessment   Upper Extremity Assessment: Generalized weakness           Lower Extremity Assessment: Generalized weakness (Rt bandage  with area dried blood; did not worsen)      Cervical / Trunk Assessment: Kyphotic  Communication   Communication: HOH  Cognition Arousal/Alertness: Awake/alert Behavior During Therapy: Impulsive Overall Cognitive Status: History of cognitive impairments - at baseline                      General Comments General comments (skin integrity, edema, etc.): sister and her husband present; sister does not want a BSC for pt to use at bedside at night unless absolutely necessary. Pt reports he goes to bathroom 3-4 times per night. Educated sister that someone would need to walk with pt and he will need to have a way to call for help when getting up. She prefers to get up with pt than to have a BSC to empty    Exercises        Assessment/Plan    PT Assessment Patient needs continued PT services  PT Diagnosis Difficulty walking   PT Problem List Decreased strength;Decreased activity tolerance;Decreased balance;Decreased mobility;Decreased knowledge of use of DME;Decreased safety awareness;Decreased knowledge of precautions;Impaired sensation;Obesity;Pain;Decreased skin integrity  PT Treatment Interventions DME instruction;Gait training;Stair training;Functional mobility training;Therapeutic activities;Cognitive remediation;Patient/family education   PT Goals (Current goals can be found in the Care Plan section) Acute Rehab PT Goals Patient Stated Goal: go home tomorrow PT Goal Formulation: With patient Time For Goal Achievement: 02/23/14 Potential to Achieve Goals: Good    Frequency Min 3X/week   Barriers to discharge        Co-evaluation               End of Session Equipment Utilized During Treatment: Gait belt Activity Tolerance: Patient limited by fatigue Patient left: in bed;with call bell/phone within reach;with family/visitor present Nurse Communication: Mobility status;Other (comment) (need for stair training prior to d/c)         Time: 0340-3524 PT Time  Calculation (min) (ACUTE ONLY): 24 min   Charges:   PT Evaluation $Initial PT Evaluation Tier I: 1 Procedure PT Treatments $Gait Training: 8-22 mins   PT G Codes:        Aashika Carta February 23, 2014, 4:41 PM Pager [phone]

## 2014-02-20 NOTE — Progress Notes (Signed)
Pt given dulcolax supp at this time; pt states last BM was 3 days ago; will cont. To monitor.

## 2014-02-20 NOTE — Progress Notes (Signed)
Orthopedic Tech Progress Note Patient Details:  Terry Martinez March 24, 1940 [id number]  Ortho Devices Type of Ortho Device: Darco shoe Ortho Device/Splint Location: rle Ortho Device/Splint Interventions: Application   [name] 02/20/2014, 10:20 AM

## 2014-02-21 LAB — CBC
HEMATOCRIT: 30.7 % — AB (ref 39.0–52.0)
HEMOGLOBIN: 9.8 g/dL — AB (ref 13.0–17.0)
MCH: 30.4 pg (ref 26.0–34.0)
MCHC: 31.9 g/dL (ref 30.0–36.0)
MCV: 95.3 fL (ref 78.0–100.0)
PLATELETS: 111 10*3/uL — AB (ref 150–400)
RBC: 3.22 MIL/uL — ABNORMAL LOW (ref 4.22–5.81)
RDW: 17 % — AB (ref 11.5–15.5)
WBC: 7.7 10*3/uL (ref 4.0–10.5)

## 2014-02-21 LAB — BASIC METABOLIC PANEL
ANION GAP: 11 (ref 5–15)
BUN: 20 mg/dL (ref 6–23)
CHLORIDE: 98 meq/L (ref 96–112)
CO2: 25 mmol/L (ref 19–32)
Calcium: 8.4 mg/dL (ref 8.4–10.5)
Creatinine, Ser: 1.35 mg/dL (ref 0.50–1.35)
GFR calc Af Amer: 59 mL/min — ABNORMAL LOW (ref >90)
GFR, EST NON AFRICAN AMERICAN: 50 mL/min — AB (ref >90)
Glucose, Bld: 112 mg/dL — ABNORMAL HIGH (ref 70–99)
Potassium: 3.9 mmol/L (ref 3.5–5.1)
Sodium: 134 mmol/L — ABNORMAL LOW (ref 135–145)

## 2014-02-21 LAB — PROTIME-INR
INR: 1.16 (ref 0.00–1.49)
PROTHROMBIN TIME: 14.9 s (ref 11.6–15.2)

## 2014-02-21 MED ORDER — WARFARIN SODIUM 5 MG PO TABS: 5.0000 mg | ORAL_TABLET | Freq: Every day | ORAL | Status: DC

## 2014-02-21 MED ORDER — WARFARIN SODIUM 7.5 MG PO TABS
7.5000 mg | ORAL_TABLET | Freq: Once | ORAL | Status: AC
  Administered 2014-02-21: 7.5 mg via ORAL
  Filled 2014-02-21: qty 1

## 2014-02-21 MED ORDER — HYDROCODONE-ACETAMINOPHEN 5-325 MG PO TABS: 1.0000 | ORAL_TABLET | Freq: Four times a day (QID) | ORAL | Status: DC | PRN

## 2014-02-21 MED ORDER — ENOXAPARIN SODIUM 150 MG/ML ~~LOC~~ SOLN
130.0000 mg | Freq: Two times a day (BID) | SUBCUTANEOUS | Status: DC
  Administered 2014-02-21: 130 mg via SUBCUTANEOUS
  Filled 2014-02-21 (×2): qty 1

## 2014-02-21 NOTE — Progress Notes (Signed)
Physical Therapy Treatment Patient Details Name: Terry Martinez MRN: [id number] DOB: 1940-08-19 Today's Date: 02/21/2014    History of Present Illness Adm 02/19/14 for Rt 1st ray amputation; per RN, when pt up 02/20/14 am he had profuse bleeding from foot; PA has been by and redressed PMHx- afib on Coumadin (prior to surgery has been using Lovenox); back surgery; numbness in hands x 7 yrs    PT Comments    Patient instructed in and negotiated 2 stairs using step-to pattern with min assist.  Reviewed sequencing and need for assist on stairs to enter home.  Patient limited by fatigue.  Continues to require repeated cuing for safety.  Follow Up Recommendations  Home health PT;Supervision for mobility/OOB     Equipment Recommendations  Rolling walker with 5" wheels    Recommendations for Other Services       Precautions / Restrictions Precautions Precautions: Fall Required Braces or Orthoses: Other Brace/Splint Other Brace/Splint: Darco shoe Rt foot Restrictions Weight Bearing Restrictions: Yes (RLE - weight through heel only) Other Position/Activity Restrictions: weight bear through Rt heel only (with Darko shoe)    Mobility  Bed Mobility                  Transfers Overall transfer level: Needs assistance Equipment used: Rolling walker (2 wheeled) Transfers: Sit to/from Stand Sit to Stand: Min assist;Mod assist         General transfer comment: Patient using correct hand placement.  Required min assist from bed and mod assist from lower recliner to move to standing.  Ambulation/Gait Ambulation/Gait assistance: Min assist Ambulation Distance (Feet): 15 Feet Assistive device: Rolling walker (2 wheeled) Gait Pattern/deviations: Step-to pattern;Trunk flexed;Wide base of support     General Gait Details: Reviewed proper gait sequence and foot placement for gait.  Cues to move more slowly for safety.   Stairs Stairs: Yes Stairs assistance: Min assist Stair  Management: One rail Right;Step to pattern;Forwards Number of Stairs: 2 General stair comments: Verbal and visual cues for negotiating 2 steps using rail and step-to pattern.  Encouraged patient to move very slowly, and have brother-in-law assist him for safety.  Wheelchair Mobility    Modified Rankin (Stroke Patients Only)       Balance                                    Cognition Arousal/Alertness: Awake/alert Behavior During Therapy: Impulsive Overall Cognitive Status: History of cognitive impairments - at baseline                      Exercises      General Comments        Pertinent Vitals/Pain Pain Assessment: 0-10 Pain Score: 3  Pain Location: Rt foot Pain Descriptors / Indicators: Sore Pain Intervention(s): Repositioned    Home Living                      Prior Function            PT Goals (current goals can now be found in the care plan section) Progress towards PT goals: Progressing toward goals    Frequency  Min 3X/week    PT Plan Current plan remains appropriate    Co-evaluation             End of Session Equipment Utilized During Treatment: Gait belt Activity Tolerance: Patient limited by fatigue  Patient left: in chair;with call bell/phone within reach     Time: 1035-1100 PT Time Calculation (min) (ACUTE ONLY): 25 min  Charges:  $Gait Training: 23-37 mins                    G Codes:      Despina Pole 02-24-2014, 12:50 PM Carita Pian. Sanjuana Kava, [place] Pager [phone]

## 2014-02-21 NOTE — Progress Notes (Addendum)
  [facility] Progress Note  02/21/2014 8:00 AM 2 Days Post-Op  Subjective:  No complaints.  Filed Vitals:   02/21/14 0531  BP: 112/66  Pulse: 85  Temp: 98.3 F (36.8 C)  Resp: 20    Physical Exam: Incisions:  Right great toe amputation site with minimal active bleeding. Sutures intact. Tissue appears viable.  Extremities:  Right foot warm. Redressed this am.  Cardiac: irregularly irregular.   CBC    Component Value Date/Time   WBC 7.7 02/21/2014 0245   RBC 3.22* 02/21/2014 0245   HGB 9.8* 02/21/2014 0245   HCT 30.7* 02/21/2014 0245   PLT 111* 02/21/2014 0245   MCV 95.3 02/21/2014 0245   MCH 30.4 02/21/2014 0245   MCHC 31.9 02/21/2014 0245   RDW 17.0* 02/21/2014 0245    BMET    Component Value Date/Time   NA 134* 02/21/2014 0245   K 3.9 02/21/2014 0245   CL 98 02/21/2014 0245   CO2 25 02/21/2014 0245   GLUCOSE 112* 02/21/2014 0245   BUN 20 02/21/2014 0245   CREATININE 1.35 02/21/2014 0245   CREATININE 1.32 11/24/2013 0900   CALCIUM 8.4 02/21/2014 0245   GFRNONAA 50* 02/21/2014 0245   GFRAA 59* 02/21/2014 0245    INR    Component Value Date/Time   INR 1.16 02/21/2014 0245   INR 1.6 02/16/2014 0952     Intake/Output Summary (Last 24 hours) at 02/21/14 0800 Last data filed at 02/21/14 0535  Gross per 24 hour  Intake    600 ml  Output   2320 ml  Net  -1720 ml     Assessment:  74 y.o. male is s/p: ray amputation of right great toe   2 Days Post-Op  Plan:  -Right amputation site healing well. Had some bleeding yesterday when getting up. Dressing changes bid and as needed. -Physical therapy reporting patient unsteady with Darco shoe and will need stair training prior to discharge. Will await PT recommendations today.  -Afib: continue lovenox today. Coumadin per pharmacy. -ABLA: Hgb trending up. Stable.  -Dispo: Discharge when ambulating well with Darco shoe. Likely D/C today. Tomorrow at the latest.    Virgina Jock,  PA-C [facility] Office: [phone] Pager: [phone] 02/21/2014 8:00 AM   Addendum Spoke with pharmacist. Will continue full dose lovenox at discharge and restart home dose coumadin 5 mg tomorrow. He has appointment with his PCP, Dr. Lacinda Axon on Monday.   Virgina Jock, PA-C   Agree with above D/c home today after works with PT on stairs  Ruta Hinds, MD [facility] [place]: [phone] Pager: [phone]

## 2014-02-21 NOTE — Care Management Note (Signed)
    Page 1 of 2   02/21/2014     2:18:46 PM CARE MANAGEMENT NOTE 02/21/2014  Patient:  Terry Martinez, Terry Martinez   Account Number:  [id number]  Date Initiated:  02/21/2014  Documentation initiated by:  [facility]  Subjective/Objective Assessment:   NXG:ZFPOIP up of right foot wound     Action/Plan:   discharge planning   Anticipated DC Date:  02/21/2014   Anticipated DC Plan:  [place]  CM consult      [facility] Choice  HOME HEALTH   Choice offered to / List presented to:  C-1 Patient   DME arranged  Vassie Moselle      DME agency  [place] arranged  [place].   Status of service:  Completed, signed off Medicare Important Message given?   (If response is "NO", the following Medicare IM given date fields will be blank) Date Medicare IM given:   Medicare IM given by:   Date Additional Medicare IM given:   Additional Medicare IM given by:    Discharge Disposition:  [place]  Per UR Regulation:    If discussed at Long Length of Stay Meetings, dates discussed:    Comments:  02/21/14 10:15  CM met with pt in room to offer choice of home health agency. Pt lives with sister, Terry Martinez [phone] and chooses [facility] to render HHPT.  CM called [facility] DME rep, Terry Martinez to please deliver rolling walker to room prior to discharge.  Referral called to [facility] rep, Terry Martinez. Address verified with Terry Martinez.  No other CM needs were communicated.  Mariane Masters, BSN, CM [phone].

## 2014-02-21 NOTE — Progress Notes (Signed)
DC IV and tele per MD orders and protocol; DC instructions reviewed with patient and family at bedside; no further questions from patient or family; waiting for walker and patient will be ready for discharge.  Rowe Pavy, RN

## 2014-02-21 NOTE — Progress Notes (Signed)
Just received call back from [facility] here in [place]. They may possibly be able to do a custom sized shoe but wouldn't be able to see him till Monday. Have him call [phone] if he is still interested. Thanks.  -per order from [place], [place], Marsh Dolly, RN

## 2014-02-22 NOTE — Discharge Summary (Signed)
[facility] Discharge Summary  Kamran Coker 1940-06-11 74 y.o. 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 male who Dr. Scot Dock saw in consultation on 11/05/13. He underwent a left axillobifemoral bypass graft in [place] 4 years ago. He had developed a wound on his right great toe. Workup in [place] had revealed that his bypass graft was patent. He had some infrainguinal arterial occlusive disease bilaterally. On the right side he had ABI of 89%. On the left side ABI was 90%. The time of his last visit the right great toe wound was healing. He returned to the [facility] office on 02/06/14 because the right great toe wound has worsened. He reportedly had an x-ray done which showed evidence of osteomyelitis. He denies fever or chills. He denies significant claudication although his activities fairly limited.   Hospital Course:  The patient was admitted to the hospital and taken to the operating room on 02/19/2014 and underwent: Ray amputation right great toe.   The patient tolerated the procedure well and was transported to the PACU in stable condition.  On POD 1, his amputation site appeared viable. It was clean with sutures intact. He has chronic atrial fibrillation and had been therapeutic lovenox pre-operatively while his coumadin was held. His lovenox was continued and coumadin restarted. Physical therapy was ordered and he was instructed ambulate partial weightbearing with heel only in Darco shoe. He was unsteady per physical therapy and advised stair training prior to discharge the next day. He had some "profuse bleeding" per nursing staff when he had tried to get up during the day.   On POD 2, his amputation site continued to heal well. He had no further bleeding episodes. His hemoglobin  was stable. He completed stair training with physical therapy and was recommended to go home with home health PT. Case management was consulted regarding home health PT. Pharmacy recommended continuing of lovenox at home and resumption of home dose coumadin (5mg ) until seeing his PCP, Dr. Lacinda Axon on 02/23/14. He was discharged home on POD 2 in good condition with extensive instructions on wound care, medications and activity restrictions. He will follow up in 2 weeks with Dr. Scot Dock.    CBC    Component Value Date/Time   WBC 7.7 02/21/2014 0245   RBC 3.22* 02/21/2014 0245   HGB 9.8* 02/21/2014 0245   HCT 30.7* 02/21/2014 0245   PLT 111* 02/21/2014 0245   MCV 95.3 02/21/2014 0245   MCH 30.4 02/21/2014 0245   MCHC 31.9 02/21/2014 0245   RDW 17.0* 02/21/2014 0245    BMET    Component Value Date/Time   NA 134* 02/21/2014 0245   K 3.9 02/21/2014 0245   CL 98 02/21/2014 0245   CO2 25 02/21/2014 0245   GLUCOSE 112* 02/21/2014 0245   BUN 20 02/21/2014 0245   CREATININE 1.35 02/21/2014 0245   CREATININE 1.32 11/24/2013 0900   CALCIUM 8.4 02/21/2014 0245   GFRNONAA 50* 02/21/2014 0245   GFRAA 59* 02/21/2014 0245     Discharge Instructions:   The patient is discharged to home with extensive instructions on wound care and progressive ambulation.  They are instructed not to drive or perform any heavy lifting until returning to see the physician in his office.  Discharge Instructions    Call MD for:  redness, tenderness, or signs  of infection (pain, swelling, bleeding, redness, odor or green/yellow discharge around incision site)    Complete by:  As directed      Call MD for:  severe or increased pain, loss or decreased feeling  in affected limb(s)    Complete by:  As directed      Call MD for:  temperature >100.5    Complete by:  As directed      Change dressing (specify)    Complete by:  As directed   Wash right foot daily with soap and water and dry completely.  Dressing change: 2  times per day using gauze to amputation site between remaining toes. Wrap with "kerlix" wrap and then ACE bandage.     Driving Restrictions    Complete by:  As directed   No driving for 2 weeks     Increase activity slowly    Complete by:  As directed   Wear Darco shoe when walking. Weightbearing on heel only.     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 Atherosclerosis of native arteries of the extremities with ulceration 02/19/2014  . Atherosclerosis of artery of extremity with ulceration 02/06/2014  . Ulcer of great toe 01/30/2014  . Hx of basal cell carcinoma 08/27/2013  . PAD (peripheral artery disease) 12/05/2012  . HTN (hypertension) 12/05/2012  . Atrial fibrillation 12/05/2012  . HLD (hyperlipidemia) 12/05/2012  . History of tobacco use 12/05/2012  . CAD (coronary artery disease) 12/05/2012  . Gout 12/05/2012  . Preventative health care 12/05/2012   Past Medical History  Diagnosis Date  . Hyperlipidemia   . Hypertension   . COPD (chronic obstructive pulmonary disease)   . Peripheral artery disease   . Atrial fibrillation   . Chronic pain   . Pneumonia   . Kidney stones   . Headache     occasional  . Cancer     squamous cell carcinoma - left arm  . Anemia     hx low iron  . Myocardial infarction     3 stents  . Umbilical hernia   . Family history of adverse reaction to anesthesia     sister has difficulty waking up       Medication List    STOP taking these medications        linezolid 600 MG tablet  Commonly known as:  ZYVOX      TAKE these medications        acetaminophen 500 MG tablet  Commonly known as:  TYLENOL  Take 500 mg by mouth every 6 (six) hours as needed for pain.     albuterol  108 (90 BASE) MCG/ACT inhaler  Commonly known as:  PROVENTIL HFA;VENTOLIN HFA  Inhale 2 puffs into the lungs every 6 (six) hours as needed for wheezing or shortness of breath.     allopurinol 300 MG tablet  Commonly known as:  ZYLOPRIM  Take 1 tablet (300 mg total) by mouth every morning.     amoxicillin-clavulanate 875-125 MG per tablet  Commonly known as:  AUGMENTIN  Take 1 tablet by mouth 2 (two) times daily.     enalapril 10 MG tablet  Commonly known as:  VASOTEC  Take 1 tablet (10 mg total) by mouth daily.     enoxaparin 150 MG/ML injection  Commonly known as:  LOVENOX  Inject 0.87 mLs (130 mg total) into the skin every 12 (twelve) hours.     EPINEPHrine 0.3 mg/0.3 mL Soaj injection  Commonly known as:  EPI-PEN  Inject 0.3 mLs (0.3 mg total) into the muscle once.     ferrous fumarate 325 (106 FE) MG Tabs tablet  Commonly known as:  HEMOCYTE - 106 mg FE  Take 1 tablet by mouth every evening.     furosemide 40 MG tablet  Commonly known as:  LASIX  Take 1 tablet (40 mg total) by mouth daily.     HYDROcodone-acetaminophen 5-325 MG per tablet  Commonly known as:  NORCO  Take 1 tablet by mouth every 6 (six) hours as needed for moderate pain.     lovastatin 20 MG tablet  Commonly known as:  MEVACOR  Take 1 tablet (20 mg total) by mouth at bedtime.     MENS MULTIVITAMIN PLUS PO  Take 1 tablet by mouth daily.     metoprolol tartrate 25 MG tablet  Commonly known as:  LOPRESSOR  Take 0.5 tablets (12.5 mg total) by mouth 2 (two) times daily.     nitroGLYCERIN 0.4 MG SL tablet  Commonly known as:  NITROSTAT  Place 1 tablet (0.4 mg total) under the tongue every 5 (five) minutes as needed for chest pain.     polyethylene glycol powder powder  Commonly known as:  GLYCOLAX/MIRALAX  Take 17 g by mouth 2 (two) times daily as needed.     potassium chloride SA 20 MEQ tablet  Commonly known as:  K-DUR,KLOR-CON  Take 20 mEq by mouth daily.     tamsulosin 0.4 MG Caps capsule    Commonly known as:  FLOMAX  TAKE ONE CAPSULE BY MOUTH EVERY DAY     warfarin 5 MG tablet  Commonly known as:  COUMADIN  Take 1 tablet (5 mg total) by mouth daily. Restart 1 tablet 5 mg daily on 02/22/13     zoster vaccine live (PF) 19400 UNT/0.65ML injection  Commonly known as:  ZOSTAVAX  Inject 19,400 Units into the skin once.        Norco #20 No Refill  Disposition: Home with home health physical therapy  Patient's condition: is Good  Follow up: 1. Dr. Scot Dock in 2 weeks   Virgina Jock, PA-C [facility] [phone] 02/22/2014  8:53 AM

## 2014-02-23 ENCOUNTER — Encounter (HOSPITAL_COMMUNITY): Payer: Self-pay | Admitting: Vascular Surgery

## 2014-02-23 ENCOUNTER — Ambulatory Visit (INDEPENDENT_AMBULATORY_CARE_PROVIDER_SITE_OTHER): Payer: Medicare Other | Admitting: *Deleted

## 2014-02-23 DIAGNOSIS — I4891 Unspecified atrial fibrillation: Secondary | ICD-10-CM

## 2014-02-23 DIAGNOSIS — Z7901 Long term (current) use of anticoagulants: Secondary | ICD-10-CM | POA: Diagnosis not present

## 2014-02-23 LAB — POCT INR: INR: 1.3

## 2014-02-24 ENCOUNTER — Telehealth: Payer: Self-pay | Admitting: Family Medicine

## 2014-02-24 ENCOUNTER — Telehealth: Payer: Self-pay | Admitting: Vascular Surgery

## 2014-02-24 DIAGNOSIS — Z7901 Long term (current) use of anticoagulants: Secondary | ICD-10-CM | POA: Diagnosis not present

## 2014-02-24 DIAGNOSIS — G8929 Other chronic pain: Secondary | ICD-10-CM | POA: Diagnosis not present

## 2014-02-24 DIAGNOSIS — I252 Old myocardial infarction: Secondary | ICD-10-CM | POA: Diagnosis not present

## 2014-02-24 DIAGNOSIS — Z4781 Encounter for orthopedic aftercare following surgical amputation: Secondary | ICD-10-CM | POA: Diagnosis not present

## 2014-02-24 DIAGNOSIS — Z87891 Personal history of nicotine dependence: Secondary | ICD-10-CM | POA: Diagnosis not present

## 2014-02-24 DIAGNOSIS — I10 Essential (primary) hypertension: Secondary | ICD-10-CM | POA: Diagnosis not present

## 2014-02-24 DIAGNOSIS — J449 Chronic obstructive pulmonary disease, unspecified: Secondary | ICD-10-CM | POA: Diagnosis not present

## 2014-02-24 DIAGNOSIS — Z89411 Acquired absence of right great toe: Secondary | ICD-10-CM | POA: Diagnosis not present

## 2014-02-24 DIAGNOSIS — Z5181 Encounter for therapeutic drug level monitoring: Secondary | ICD-10-CM | POA: Diagnosis not present

## 2014-02-24 DIAGNOSIS — I70201 Unspecified atherosclerosis of native arteries of extremities, right leg: Secondary | ICD-10-CM | POA: Diagnosis not present

## 2014-02-24 DIAGNOSIS — I4891 Unspecified atrial fibrillation: Secondary | ICD-10-CM | POA: Diagnosis not present

## 2014-02-24 NOTE — Telephone Encounter (Signed)
Terry Martinez called from [facility], he is a home health caregiver for the patient. The issue that the family is having is getting the patient here to do his INR. Montine Circle is asking for verbal orders for [facility] (himself) to do the INR when it is needed and call the results in to [name]. Please call Montine Circle at [phone] to give verbal orders. jw

## 2014-02-24 NOTE — Telephone Encounter (Signed)
-----   Message from Mena Goes, RN sent at 02/23/2014 11:09 AM EST ----- Regarding: Schedule   ----- Message -----    From: Alvia Grove, PA-C    Sent: 02/21/2014   8:43 AM      To: [facility] Charge Pool  S/p ray amputation right great toe 02/19/14  F/u with Dr. Scot Dock in 2 weeks.  Thanks Maudie Mercury

## 2014-02-24 NOTE — Telephone Encounter (Signed)
LM for pt re appt, it was already scheduled, just changed from NP to MD, dpm

## 2014-02-25 ENCOUNTER — Telehealth: Payer: Self-pay | Admitting: *Deleted

## 2014-02-25 NOTE — Telephone Encounter (Signed)
SPOKE WITH PATIENT, INFORMED OF ECHO RESULTS. APPOINTMENT MADE FOR FOLLOW UP IN MARCH, PER LAST OV NOTE.      Message     LV function is very mildly down.    The aortic and mitral valve have mild to moderate regurgitation     Will follow periodically with echos to make sure doesn't get worse    No change in recommendations.     ----- Message -----     From: Fay Records, MD     Sent: 02/06/2014  3:22 PM      To: Fay Records, MD

## 2014-02-26 ENCOUNTER — Telehealth: Payer: Self-pay | Admitting: *Deleted

## 2014-02-26 ENCOUNTER — Ambulatory Visit (INDEPENDENT_AMBULATORY_CARE_PROVIDER_SITE_OTHER): Payer: Medicare Other | Admitting: *Deleted

## 2014-02-26 DIAGNOSIS — I10 Essential (primary) hypertension: Secondary | ICD-10-CM | POA: Diagnosis not present

## 2014-02-26 DIAGNOSIS — I4891 Unspecified atrial fibrillation: Secondary | ICD-10-CM

## 2014-02-26 DIAGNOSIS — I70201 Unspecified atherosclerosis of native arteries of extremities, right leg: Secondary | ICD-10-CM | POA: Diagnosis not present

## 2014-02-26 DIAGNOSIS — Z7901 Long term (current) use of anticoagulants: Secondary | ICD-10-CM | POA: Diagnosis not present

## 2014-02-26 DIAGNOSIS — G8929 Other chronic pain: Secondary | ICD-10-CM | POA: Diagnosis not present

## 2014-02-26 DIAGNOSIS — Z4781 Encounter for orthopedic aftercare following surgical amputation: Secondary | ICD-10-CM | POA: Diagnosis not present

## 2014-02-26 DIAGNOSIS — J449 Chronic obstructive pulmonary disease, unspecified: Secondary | ICD-10-CM | POA: Diagnosis not present

## 2014-02-26 DIAGNOSIS — Z89411 Acquired absence of right great toe: Secondary | ICD-10-CM | POA: Diagnosis not present

## 2014-02-26 LAB — POCT INR: INR: 1.8

## 2014-02-26 NOTE — Telephone Encounter (Signed)
Dr. Lacinda Axon from the [facility] called to request that we move up this patient's postop appt for his Right great toe amputation with Dr. Scot Dock. Patient is having some slight bleeding and Dr. Lacinda Axon said he looked at it this morning and the patient was reassured. I have had our front desk staff move up the patient's appt to 03-04-14. Dr. Lacinda Axon will instruct patient to call us if any other problems occur.

## 2014-02-26 NOTE — Telephone Encounter (Signed)
Spoke with Montine Circle and gave verbal ok

## 2014-02-26 NOTE — Telephone Encounter (Signed)
Yes.  Okay to give verbal orders. Patient needs INR Monday.

## 2014-02-27 DIAGNOSIS — Z4781 Encounter for orthopedic aftercare following surgical amputation: Secondary | ICD-10-CM | POA: Diagnosis not present

## 2014-02-27 DIAGNOSIS — I10 Essential (primary) hypertension: Secondary | ICD-10-CM | POA: Diagnosis not present

## 2014-02-27 DIAGNOSIS — I70201 Unspecified atherosclerosis of native arteries of extremities, right leg: Secondary | ICD-10-CM | POA: Diagnosis not present

## 2014-02-27 DIAGNOSIS — Z89411 Acquired absence of right great toe: Secondary | ICD-10-CM | POA: Diagnosis not present

## 2014-02-27 DIAGNOSIS — J449 Chronic obstructive pulmonary disease, unspecified: Secondary | ICD-10-CM | POA: Diagnosis not present

## 2014-02-27 DIAGNOSIS — G8929 Other chronic pain: Secondary | ICD-10-CM | POA: Diagnosis not present

## 2014-03-02 ENCOUNTER — Telehealth: Payer: Self-pay | Admitting: *Deleted

## 2014-03-02 DIAGNOSIS — Z4781 Encounter for orthopedic aftercare following surgical amputation: Secondary | ICD-10-CM | POA: Diagnosis not present

## 2014-03-02 DIAGNOSIS — I10 Essential (primary) hypertension: Secondary | ICD-10-CM | POA: Diagnosis not present

## 2014-03-02 DIAGNOSIS — Z89411 Acquired absence of right great toe: Secondary | ICD-10-CM | POA: Diagnosis not present

## 2014-03-02 DIAGNOSIS — I70201 Unspecified atherosclerosis of native arteries of extremities, right leg: Secondary | ICD-10-CM | POA: Diagnosis not present

## 2014-03-02 DIAGNOSIS — J449 Chronic obstructive pulmonary disease, unspecified: Secondary | ICD-10-CM | POA: Diagnosis not present

## 2014-03-02 DIAGNOSIS — G8929 Other chronic pain: Secondary | ICD-10-CM | POA: Diagnosis not present

## 2014-03-02 NOTE — Telephone Encounter (Signed)
Terry Martinez, Physical Therapist with [facility] called to report PT/ INR results.  PT 38.8 and INR 3.2.  Derl Barrow, RN

## 2014-03-03 ENCOUNTER — Telehealth: Payer: Self-pay | Admitting: Family Medicine

## 2014-03-03 ENCOUNTER — Encounter: Payer: Self-pay | Admitting: Vascular Surgery

## 2014-03-03 DIAGNOSIS — G8929 Other chronic pain: Secondary | ICD-10-CM | POA: Diagnosis not present

## 2014-03-03 DIAGNOSIS — I10 Essential (primary) hypertension: Secondary | ICD-10-CM | POA: Diagnosis not present

## 2014-03-03 DIAGNOSIS — Z89411 Acquired absence of right great toe: Secondary | ICD-10-CM | POA: Diagnosis not present

## 2014-03-03 DIAGNOSIS — Z4781 Encounter for orthopedic aftercare following surgical amputation: Secondary | ICD-10-CM | POA: Diagnosis not present

## 2014-03-03 DIAGNOSIS — I70201 Unspecified atherosclerosis of native arteries of extremities, right leg: Secondary | ICD-10-CM | POA: Diagnosis not present

## 2014-03-03 DIAGNOSIS — J449 Chronic obstructive pulmonary disease, unspecified: Secondary | ICD-10-CM | POA: Diagnosis not present

## 2014-03-03 NOTE — Telephone Encounter (Signed)
Called Mr. Gatton to give new warfarin dose:  Physical Therapist checked INR Mon 03-02-14  Instructed pt to take warfarin on the schedule he used to take ;  Pt was able to tell me exactly how he took warfarin in the past without any prompting from me.  To take:  2.5 mg - Mon, Wed, Fri;  5 mg - Sun, Tues, Thurs, Sat   PT will recheck Mon, per Dr. Jonathon Neils [place] call to Arlyn Dunning

## 2014-03-04 ENCOUNTER — Ambulatory Visit (INDEPENDENT_AMBULATORY_CARE_PROVIDER_SITE_OTHER): Payer: Self-pay | Admitting: Vascular Surgery

## 2014-03-04 ENCOUNTER — Encounter: Payer: Self-pay | Admitting: Vascular Surgery

## 2014-03-04 VITALS — BP 128/63 | HR 65 | Temp 97.7°F | Ht 76.0 in | Wt 297.0 lb

## 2014-03-04 DIAGNOSIS — Z48812 Encounter for surgical aftercare following surgery on the circulatory system: Secondary | ICD-10-CM

## 2014-03-04 NOTE — Progress Notes (Signed)
   Patient name: Terry Martinez MRN: [id number] DOB: Apr 12, 1940 Sex: male  REASON FOR VISIT: Follow up after radiographic amputation of the right great toe  HPI: Terry Martinez is a 74 y.o. male who had undergone previous revascularization in [place]. He presented with osteomyelitis of his right great toe. He underwent a ray amputation of the right great toe on 02/19/2014. He returns for a 2 week follow up visit. He has no specific complaints. He denies fever or chills.   REVIEW OF SYSTEMS: Valu.Nieves ] denotes positive finding; [  ] denotes negative finding  CARDIOVASCULAR:  [ ]  chest pain   [ ]  dyspnea on exertion    CONSTITUTIONAL:  [ ]  fever   [ ]  chills  PHYSICAL EXAM: There were no vitals filed for this visit. There is no weight on file to calculate BMI. GENERAL: The patient is a well-nourished male, in no acute distress. The vital signs are documented above. CARDIOVASCULAR: There is a regular rate and rhythm. PULMONARY: There is good air exchange bilaterally without wheezing or rales. The toe amputation site is healing adequately with only some slight separation in the central portion. There is no significant drainage. I removed 4 sutures today and I left the remaining sutures.   MEDICAL ISSUES: The patient is doing well status post ray amputation of the right great toe. He'll return in 3 weeks for removal of the remaining sutures. I'm not here in 2 weeks. I told him to come in 1 week if he has any concerns. I have given him a prescription for a Darco shoe as the one he has his small.  [place] [facility] [place]: [phone]

## 2014-03-05 DIAGNOSIS — G8929 Other chronic pain: Secondary | ICD-10-CM | POA: Diagnosis not present

## 2014-03-05 DIAGNOSIS — Z89411 Acquired absence of right great toe: Secondary | ICD-10-CM | POA: Diagnosis not present

## 2014-03-05 DIAGNOSIS — I70201 Unspecified atherosclerosis of native arteries of extremities, right leg: Secondary | ICD-10-CM | POA: Diagnosis not present

## 2014-03-05 DIAGNOSIS — I10 Essential (primary) hypertension: Secondary | ICD-10-CM | POA: Diagnosis not present

## 2014-03-05 DIAGNOSIS — J449 Chronic obstructive pulmonary disease, unspecified: Secondary | ICD-10-CM | POA: Diagnosis not present

## 2014-03-05 DIAGNOSIS — Z4781 Encounter for orthopedic aftercare following surgical amputation: Secondary | ICD-10-CM | POA: Diagnosis not present

## 2014-03-09 ENCOUNTER — Telehealth: Payer: Self-pay | Admitting: Family Medicine

## 2014-03-09 ENCOUNTER — Telehealth: Payer: Self-pay | Admitting: *Deleted

## 2014-03-09 DIAGNOSIS — I10 Essential (primary) hypertension: Secondary | ICD-10-CM | POA: Diagnosis not present

## 2014-03-09 DIAGNOSIS — I70201 Unspecified atherosclerosis of native arteries of extremities, right leg: Secondary | ICD-10-CM | POA: Diagnosis not present

## 2014-03-09 DIAGNOSIS — Z4781 Encounter for orthopedic aftercare following surgical amputation: Secondary | ICD-10-CM | POA: Diagnosis not present

## 2014-03-09 DIAGNOSIS — G8929 Other chronic pain: Secondary | ICD-10-CM | POA: Diagnosis not present

## 2014-03-09 DIAGNOSIS — J449 Chronic obstructive pulmonary disease, unspecified: Secondary | ICD-10-CM | POA: Diagnosis not present

## 2014-03-09 DIAGNOSIS — Z89411 Acquired absence of right great toe: Secondary | ICD-10-CM | POA: Diagnosis not present

## 2014-03-09 NOTE — Telephone Encounter (Signed)
Physical Therapist called with INR report: see phone note by Latina Craver      INR 1.7  Seconds 20.6  Patient is taking warfarin 2.5 mg - Mon, Wed, Fri;  5 mg - Sun, Tues, Thurs, Sat;   Patient is not having any bleeding or bruising, no missed doses or extra doses, no changes in diet or medications, no hospital/ED/Urgent care visits, no antibiotics  Advised patient to take warfarin 2.5 mg - Sun & Wed;  5 mg - Mon, Tues, Thurs, Fri, Sat;  Pt repeated the dose and states he understands; states his foot is beginning to look better.    To recheck 1 week;    Dosed per Dr. Lacinda Axon

## 2014-03-09 NOTE — Telephone Encounter (Signed)
Derrek, Physical Therapist with [facility] called PT 20.6 and INR 1.7 today.  Will forward to PCP.  Derl Barrow, RN

## 2014-03-11 ENCOUNTER — Ambulatory Visit (INDEPENDENT_AMBULATORY_CARE_PROVIDER_SITE_OTHER): Payer: Self-pay | Admitting: Vascular Surgery

## 2014-03-11 ENCOUNTER — Encounter: Payer: Self-pay | Admitting: Vascular Surgery

## 2014-03-11 ENCOUNTER — Encounter: Payer: Medicare Other | Admitting: Vascular Surgery

## 2014-03-11 VITALS — BP 115/40 | HR 71 | Temp 98.0°F | Ht 76.0 in | Wt 297.0 lb

## 2014-03-11 DIAGNOSIS — Z48812 Encounter for surgical aftercare following surgery on the circulatory system: Secondary | ICD-10-CM

## 2014-03-11 NOTE — Progress Notes (Signed)
   Patient name: Terry Martinez MRN: [id number] DOB: 11-Oct-1940 Sex: male  REASON FOR VISIT: Follow after ray amputation of the right great toe  HPI: Terry Martinez is a 74 y.o. male who underwent ray amputation of the right great toe on 02/19/2014. He is now approximately weeks postop. He comes in for a wound check. He was able to obtain a Darko shoe which fits better. He denies fever or chills.  REVIEW OF SYSTEMS: Valu.Nieves ] denotes positive finding; [  ] denotes negative finding  CARDIOVASCULAR:  [ ]  chest pain   [ ]  dyspnea on exertion    CONSTITUTIONAL:  [ ]  fever   [ ]  chills  PHYSICAL EXAM: Filed Vitals:   03/11/14 0926  BP: 115/40  Pulse: 71  Temp: 98 F (36.7 C)  TempSrc: Oral  Height: 6\' 4"  (1.93 m)  Weight: 297 lb (134.718 kg)  SpO2: 97%   Body mass index is 36.17 kg/(m^2). GENERAL: The patient is a well-nourished male, in no acute distress. The vital signs are documented above. CARDIOVASCULAR: There is a regular rate and rhythm. PULMONARY: There is good air exchange bilaterally without wheezing or rales. The wound is healing adequately. There is some slight separation. I took out 2 more of his sutures.  MEDICAL ISSUES: wwe have instructed the patient on how to pack his wound. He'll continue to wear his Darco shoe and be our show weightbearing on the right side heel only. I'll see him back in 2 weeks.  [place] [facility] [place]: [phone]

## 2014-03-11 NOTE — Patient Instructions (Signed)
Wound Care Wound care helps prevent pain and infection.  [name]   Only take medicine as told by your doctor.  Clean the wound daily with mild soap and water.  Change any bandages (dressings) as told by your doctor.  Put medicated cream and a bandage on the wound as told by your doctor.  Change the bandage if it gets wet, dirty, or starts to smell.  Take showers. Do not take baths, swim, or do anything that puts your wound under water.  Rest and raise (elevate) the wound until the pain and puffiness (swelling) are better.  Keep all doctor visits as told. GET HELP RIGHT AWAY IF:   Yellowish-white fluid (pus) comes from the wound.  Medicine does not lessen your pain.  There is a red streak going away from the wound.  You have a fever. MAKE SURE YOU:   Understand these instructions.  Will watch your condition.  Will get help right away if you are not doing well or get worse. Document Released: 11/16/2007 Document Revised: 05/01/2011 Document Reviewed: 06/12/2010 [facility] Patient Information 2015 [place], [place]. This information is not intended to replace advice given to you by your health care provider. Make sure you discuss any questions you have with your health care provider.      Leota Jacobsen, RN [facility]   [phone] ext [phone]

## 2014-03-12 DIAGNOSIS — J449 Chronic obstructive pulmonary disease, unspecified: Secondary | ICD-10-CM | POA: Diagnosis not present

## 2014-03-12 DIAGNOSIS — I70201 Unspecified atherosclerosis of native arteries of extremities, right leg: Secondary | ICD-10-CM | POA: Diagnosis not present

## 2014-03-12 DIAGNOSIS — G8929 Other chronic pain: Secondary | ICD-10-CM | POA: Diagnosis not present

## 2014-03-12 DIAGNOSIS — I10 Essential (primary) hypertension: Secondary | ICD-10-CM | POA: Diagnosis not present

## 2014-03-12 DIAGNOSIS — Z4781 Encounter for orthopedic aftercare following surgical amputation: Secondary | ICD-10-CM | POA: Diagnosis not present

## 2014-03-12 DIAGNOSIS — Z89411 Acquired absence of right great toe: Secondary | ICD-10-CM | POA: Diagnosis not present

## 2014-03-16 ENCOUNTER — Telehealth: Payer: Self-pay | Admitting: *Deleted

## 2014-03-16 DIAGNOSIS — J449 Chronic obstructive pulmonary disease, unspecified: Secondary | ICD-10-CM | POA: Diagnosis not present

## 2014-03-16 DIAGNOSIS — G8929 Other chronic pain: Secondary | ICD-10-CM | POA: Diagnosis not present

## 2014-03-16 DIAGNOSIS — I70201 Unspecified atherosclerosis of native arteries of extremities, right leg: Secondary | ICD-10-CM | POA: Diagnosis not present

## 2014-03-16 DIAGNOSIS — I10 Essential (primary) hypertension: Secondary | ICD-10-CM | POA: Diagnosis not present

## 2014-03-16 DIAGNOSIS — Z4781 Encounter for orthopedic aftercare following surgical amputation: Secondary | ICD-10-CM | POA: Diagnosis not present

## 2014-03-16 DIAGNOSIS — Z89411 Acquired absence of right great toe: Secondary | ICD-10-CM | POA: Diagnosis not present

## 2014-03-16 NOTE — Telephone Encounter (Signed)
Derrek, Physical Therapist with [facility] called to report PT/INR results.  PT today 23.1 and INR 1.9.  Derl Barrow, RN

## 2014-03-16 NOTE — Telephone Encounter (Signed)
I am out on vacation.  Discuss with preceptor or Valentina Lucks.  Thanks

## 2014-03-16 NOTE — Telephone Encounter (Signed)
Received call from [facility] today with INR results on Mr [facility]. INR 1.9; 23.1 Sec. No changes to report. His current dose is 2.5 mg on Sun and Wed; and 5 mg the rest of the week. His new dose will be 5 mg daily and recheck in 1 week per Dr Lindell Noe.Busick, Kevin Fenton

## 2014-03-19 DIAGNOSIS — I70201 Unspecified atherosclerosis of native arteries of extremities, right leg: Secondary | ICD-10-CM | POA: Diagnosis not present

## 2014-03-19 DIAGNOSIS — Z4781 Encounter for orthopedic aftercare following surgical amputation: Secondary | ICD-10-CM | POA: Diagnosis not present

## 2014-03-19 DIAGNOSIS — Z89411 Acquired absence of right great toe: Secondary | ICD-10-CM | POA: Diagnosis not present

## 2014-03-19 DIAGNOSIS — G8929 Other chronic pain: Secondary | ICD-10-CM | POA: Diagnosis not present

## 2014-03-19 DIAGNOSIS — J449 Chronic obstructive pulmonary disease, unspecified: Secondary | ICD-10-CM | POA: Diagnosis not present

## 2014-03-19 DIAGNOSIS — I10 Essential (primary) hypertension: Secondary | ICD-10-CM | POA: Diagnosis not present

## 2014-03-23 ENCOUNTER — Telehealth: Payer: Self-pay | Admitting: *Deleted

## 2014-03-23 DIAGNOSIS — I70201 Unspecified atherosclerosis of native arteries of extremities, right leg: Secondary | ICD-10-CM | POA: Diagnosis not present

## 2014-03-23 DIAGNOSIS — G8929 Other chronic pain: Secondary | ICD-10-CM | POA: Diagnosis not present

## 2014-03-23 DIAGNOSIS — I10 Essential (primary) hypertension: Secondary | ICD-10-CM | POA: Diagnosis not present

## 2014-03-23 DIAGNOSIS — Z89411 Acquired absence of right great toe: Secondary | ICD-10-CM | POA: Diagnosis not present

## 2014-03-23 DIAGNOSIS — Z4781 Encounter for orthopedic aftercare following surgical amputation: Secondary | ICD-10-CM | POA: Diagnosis not present

## 2014-03-23 DIAGNOSIS — J449 Chronic obstructive pulmonary disease, unspecified: Secondary | ICD-10-CM | POA: Diagnosis not present

## 2014-03-23 NOTE — Telephone Encounter (Signed)
Patient's sister called in to report that Mr. Haas's foot has gotten worse overnight. He now has green drainage with a foul odor. His wound is much deeper (she is packing the wound on opposite days from when Adv Chelsea sees him) and redder than usual. The redness is on the top of his foot but she says that he is afebrile. He is still wearing his Darco shoe. His [place], Jomarie Longs [phone]) also called about Mr. Diana's foot and he confirmed what the sister said. We will make the patient an appt with our NP tomorrow. Patient is not on any antibiotics at this time.

## 2014-03-23 NOTE — Telephone Encounter (Signed)
Terry Martinez, Physical Therapist with [facility] called to report PT and INR.  PT 20.7 and INR 1.7.  Terry stated this was his last blood draw with [facility].  Pt would need appt with [facility] coumadin clinic for next blood draw. Please call with questions at [phone].  Derl Barrow, RN

## 2014-03-23 NOTE — Telephone Encounter (Signed)
Called and spoke with Terry Martinez, he reports no changes in his diet or medications. Per Dr Jonathon Jordan instructions I advised the patient to increase his coumadin dose from 5 mg daily to 7.5 mg on Mon and Fri and 5 mg the rest of the week and to come into the office on Monday 03/30/14 for a lab visit to recheck his level. He did mention that his foot does not look like it is healing after surgery. I asked him if he needed to come in to have it looked at and he declined because he says he has an appointment with the surgeon on Wednesday. Busick, Kevin Fenton

## 2014-03-24 ENCOUNTER — Other Ambulatory Visit: Payer: Self-pay | Admitting: Vascular Surgery

## 2014-03-24 ENCOUNTER — Encounter: Payer: Self-pay | Admitting: Vascular Surgery

## 2014-03-24 ENCOUNTER — Ambulatory Visit (INDEPENDENT_AMBULATORY_CARE_PROVIDER_SITE_OTHER): Payer: Self-pay | Admitting: Family

## 2014-03-24 ENCOUNTER — Encounter: Payer: Self-pay | Admitting: Family

## 2014-03-24 VITALS — BP 142/74 | HR 64 | Temp 96.6°F | Resp 16 | Ht 75.5 in | Wt 299.0 lb

## 2014-03-24 DIAGNOSIS — L748 Other eccrine sweat disorders: Secondary | ICD-10-CM | POA: Insufficient documentation

## 2014-03-24 DIAGNOSIS — L03115 Cellulitis of right lower limb: Secondary | ICD-10-CM

## 2014-03-24 DIAGNOSIS — Z48812 Encounter for surgical aftercare following surgery on the circulatory system: Secondary | ICD-10-CM | POA: Insufficient documentation

## 2014-03-24 DIAGNOSIS — L539 Erythematous condition, unspecified: Secondary | ICD-10-CM | POA: Insufficient documentation

## 2014-03-24 DIAGNOSIS — T814XXD Infection following a procedure, subsequent encounter: Secondary | ICD-10-CM | POA: Diagnosis not present

## 2014-03-24 DIAGNOSIS — T148 Other injury of unspecified body region: Secondary | ICD-10-CM

## 2014-03-24 DIAGNOSIS — Z89411 Acquired absence of right great toe: Secondary | ICD-10-CM

## 2014-03-24 DIAGNOSIS — T148XXA Other injury of unspecified body region, initial encounter: Secondary | ICD-10-CM | POA: Insufficient documentation

## 2014-03-24 DIAGNOSIS — L24A9 Irritant contact dermatitis due friction or contact with other specified body fluids: Secondary | ICD-10-CM | POA: Insufficient documentation

## 2014-03-24 MED ORDER — CEPHALEXIN 500 MG PO CAPS
500.0000 mg | ORAL_CAPSULE | Freq: Three times a day (TID) | ORAL | Status: DC
Start: 2014-03-24 — End: 2014-04-29

## 2014-03-24 NOTE — Progress Notes (Signed)
    Postoperative Visit   History of Present Illness  Terry Martinez is a 74 y.o. male who is S/P Right Great Toe Amp. 02-19-14 by Dr. Scot Dock. He returns today with C/O 2-3 day hx of green drainage, odor, deeper wound and redness, NO PAIN . [facility] twice weekly and Sister changes dressing daily, wet-to-dry. He denies fever or chills. He is not on an antibx now.   Physical Examination  Filed Vitals:   03/24/14 1247  BP: 142/74  Pulse: 64  Temp: 96.6 F (35.9 C)  TempSrc: Oral  Resp: 16  Height: 6' 3.5" (1.918 m)  Weight: 299 lb (135.626 kg)  SpO2: 99%   Body mass index is 36.87 kg/(m^2).   Right DP pulse is audible by Doppler, monophasic. Right great toe amputation site is draining moderate amount s/s drainage. Dorsum of right foot is erythematous, probable cellulitis. Suture in place. No foul odor, no purulent nor green drainage.   Medical Decision Making  Terry Martinez is a 74 y.o. male who is S/P Right Great Toe Amp. 02-19-14 by Dr. Scot Dock. He returns today with C/O 2-3 day hx of green drainage, odor, deeper wound and redness, NO PAIN . [facility] twice weekly and Sister changes dressing daily, wet-to-dry.  Dr. Kellie Simmering examined pt and spoke with pt and sister. Wound culture obtained. Home health to do wet to dry dressing changes right great toe amp site, pack deep into wound, Alinda Sierras in [facility] office notified. Keflex 500 mg tid e-scribed to pt's pharmacy. Follow up tomorrow morning as scheduled with Dr. Scot Dock.    NICKEL, Sharmon Leyden, RN, MSN, FNP-C [facility] [place] Office: [phone]  03/24/2014, 1:22 PM  Clinic MD: Early

## 2014-03-25 ENCOUNTER — Ambulatory Visit (INDEPENDENT_AMBULATORY_CARE_PROVIDER_SITE_OTHER)
Admission: RE | Admit: 2014-03-25 | Discharge: 2014-03-25 | Disposition: A | Discharge status: Home / Self Care | Payer: Medicare Other | Source: Ambulatory Visit | Attending: Vascular Surgery | Admitting: Vascular Surgery

## 2014-03-25 ENCOUNTER — Telehealth: Payer: Self-pay | Admitting: Vascular Surgery

## 2014-03-25 ENCOUNTER — Encounter: Payer: Medicare Other | Admitting: Vascular Surgery

## 2014-03-25 ENCOUNTER — Other Ambulatory Visit: Payer: Self-pay | Admitting: Vascular Surgery

## 2014-03-25 ENCOUNTER — Ambulatory Visit (INDEPENDENT_AMBULATORY_CARE_PROVIDER_SITE_OTHER): Payer: Self-pay | Admitting: Vascular Surgery

## 2014-03-25 ENCOUNTER — Ambulatory Visit (HOSPITAL_COMMUNITY)
Admission: RE | Admit: 2014-03-25 | Discharge: 2014-03-25 | Disposition: A | Discharge status: Home / Self Care | Payer: Medicare Other | Source: Ambulatory Visit | Attending: Vascular Surgery | Admitting: Vascular Surgery

## 2014-03-25 ENCOUNTER — Encounter: Payer: Self-pay | Admitting: Vascular Surgery

## 2014-03-25 VITALS — BP 129/62 | HR 72 | Temp 97.5°F | Ht 75.5 in | Wt 299.0 lb

## 2014-03-25 DIAGNOSIS — Z48812 Encounter for surgical aftercare following surgery on the circulatory system: Secondary | ICD-10-CM

## 2014-03-25 DIAGNOSIS — I739 Peripheral vascular disease, unspecified: Secondary | ICD-10-CM

## 2014-03-25 DIAGNOSIS — T82398D Other mechanical complication of other vascular grafts, subsequent encounter: Secondary | ICD-10-CM

## 2014-03-25 DIAGNOSIS — I70299 Other atherosclerosis of native arteries of extremities, unspecified extremity: Secondary | ICD-10-CM

## 2014-03-25 DIAGNOSIS — Z01810 Encounter for preprocedural cardiovascular examination: Secondary | ICD-10-CM

## 2014-03-25 DIAGNOSIS — L97909 Non-pressure chronic ulcer of unspecified part of unspecified lower leg with unspecified severity: Secondary | ICD-10-CM

## 2014-03-25 NOTE — Addendum Note (Signed)
Addended by: Peter Minium K on: 03/25/2014 11:30 AM   Modules accepted: Orders

## 2014-03-25 NOTE — Progress Notes (Signed)
[facility]  Patient name: Terry Martinez MRN: [id number] DOB: [birth year] Sex: male  REASON FOR VISIT: nonhealing ray amputation of the right great toe.  HPI: Terry Martinez is a 74 y.o. 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 He underwent ray amputation of the right great toe on 02/19/2014. The patient was seen yesterday in the office and was started on Keflex and told to come back today.   oof note, in [place] apparently there was some subclavian artery stenosis also which has been addressed with angioplasty and stenting.  He underwent a left axillobifemoral bypass graft in [place] 4 years ago. He had developed a wound on his right great toe. Workup in [place] had revealed that his bypass graft was patent. He had some infrainguinal arterial occlusive disease bilaterally. On the right side he had ABI of 89%. On the left side ABI was 90%. He was to return in 2 weeks and have a graft duplex and ABIs but is back today as the wound is not healing on the right great toe.  He has noted some drainage from the open wound on the right great toe and also some odor. He denies fever or chills.  Past Medical History  Diagnosis Date  . Hyperlipidemia   . Hypertension   . COPD (chronic obstructive pulmonary disease)   . Peripheral artery disease   . Atrial fibrillation   . Chronic pain   . Pneumonia   . Kidney stones   . Headache     occasional  . Cancer     squamous cell carcinoma - left arm  . Anemia     hx low iron  . Myocardial infarction     3 stents  . Umbilical hernia   . Family history of adverse reaction to anesthesia     sister has difficulty waking up   Family History  Problem Relation Age of Onset  . Diabetes Mother   . Cancer Mother     Right Breast  . Heart disease Mother   . Hyperlipidemia Mother   . Hypertension Mother   .  Lymphoma Mother     Chemo  . Cancer Father   . Cancer Brother   . Heart disease Brother   . Depression Brother   . Early death Brother   . Hyperlipidemia Brother   . Hypertension Brother   . Alcohol abuse Sister   . Heart disease Sister   . Hyperlipidemia Sister   . Hypertension Sister   . Stroke Sister   . Heart disease Maternal Grandmother   . Kidney disease Maternal Grandmother   . Heart disease Maternal Grandfather   . Kidney disease Maternal Grandfather    SOCIAL HISTORY: History  Substance Use Topics  . Smoking status: Former Smoker    Types: Cigarettes    Quit date: 07/22/1998  . Smokeless tobacco: Current User    Types: Snuff  . Alcohol Use: No   Allergies  Allergen Reactions  . Zocor [Simvastatin] Hives    REVIEW OF SYSTEMS: Valu.Nieves ] denotes positive finding; [  ] denotes negative finding  CARDIOVASCULAR:  [ ]  chest pain   [ ]  chest pressure   [ ]  palpitations   [ ]  orthopnea   Valu.Nieves ] dyspnea on exertion   [ ]  claudication   [ ]  rest pain   [ ]  DVT   [ ]  phlebitis PULMONARY:   [ ]   productive cough   [ ]  asthma   [ ]  wheezing NEUROLOGIC:   [ ]  weakness  [ ]  paresthesias  [ ]  aphasia  [ ]  amaurosis  [ ]  dizziness HEMATOLOGIC:   [ ]  bleeding problems   [ ]  clotting disorders MUSCULOSKELETAL:  [ ]  joint pain   [ ]  joint swelling [ ]  leg swelling GASTROINTESTINAL: [ ]   blood in stool  [ ]   hematemesis GENITOURINARY:  [ ]   dysuria  [ ]   hematuria PSYCHIATRIC:  [ ]  history of major depression INTEGUMENTARY:  [ ]  rashes  Valu.Nieves ] ulcers CONSTITUTIONAL:  [ ]  fever   [ ]  chills  PHYSICAL EXAM: Filed Vitals:   03/25/14 0900  BP: 129/62  Pulse: 72  Temp: 97.5 F (36.4 C)  TempSrc: Oral  Height: 6' 3.5" (1.918 m)  Weight: 299 lb (135.626 kg)  SpO2: 95%   Body mass index is 36.87 kg/(m^2). GENERAL: The patient is a well-nourished male, in no acute distress. The vital signs are documented above. CARDIOVASCULAR: There is a regular rate and rhythm. I cannot palpate femoral  pulses or pedal pulses. PULMONARY: There is good air exchange bilaterally without wheezing or rales. ABDOMEN: Soft and non-tender with normal pitched bowel sounds.  NEUROLOGIC: No focal weakness or paresthesias are detected. SKIN: the ray amputation of the right great toe has an open area that tracks fairly deep. There is slight odor to this.[name] PSYCHIATRIC: The patient has a normal affect.  DATA:  GRAFT DUPLEX: The graft duplex shows that the left axillobifemoral bypass graft is patent. There are elevated velocities at the proximal anastomosis with a peak systolic velocity of 161 cm/s. There are some mildly elevated velocities at the distal aspect of the left axillofemoral graft.  ABIS: I have independent reinterpreted the arterial Doppler study which shows monophasic Doppler signals in the right dorsalis pedis and posterior tibial positions with an ABI of 69% on the right. On the left side there is a monophasic posterior tibial signal with a biphasic dorsalis pedis signal. ABI is 86% on the left.  Gram stain from yesterday showed no organisms. The culture is pending.   MEDICAL ISSUES: NONHEALING RIGHT GREAT TOE AMPUTATION: The patient has now developed an open wound at the site of the ray amputation of the right great toe. Culture of this is pending. This tracks fairly far posteriorly and for this reason I have referred the patient to be seen by Dr. Meridee Score as I believe he will require more extensive amputation of the right foot. I'm concerned that he may not have adequate circulation to heal an extensive procedure on the right foot. His axillobifemoral bypass graft is patent, however, he has known infrainguinal arterial occlusive disease. Rather than stick his graft with the small risk of compromising the graft or risking graft infection, we will try to evaluate the graft and runoff with CT angiography. I'll have her return after his CT angiogram and obtain a vein map on the right side as he may  require an infrainguinal bypass. I'll make further recommendations pending the results of his CT angiogram. Clearly this is a limb threatening problem. He is currently on Keflex pending his culture results.   [place] [facility] [place] Beeper: [phone]

## 2014-03-25 NOTE — Telephone Encounter (Signed)
Spoke with [name] - gave the following information: 03/27/14 7:45 am Dr. Sharol Given [phone] Labs for CTA at [facility] after this appointment 11 am our office for vein mapping 03/31/14 CTA [facility] 2:10 pm 04/08/14  CSD 2pm She verbalized understanding.

## 2014-03-25 NOTE — Addendum Note (Signed)
Addended by: Mena Goes on: 03/25/2014 11:27 AM   Modules accepted: Orders

## 2014-03-26 DIAGNOSIS — G8929 Other chronic pain: Secondary | ICD-10-CM | POA: Diagnosis not present

## 2014-03-26 DIAGNOSIS — Z4781 Encounter for orthopedic aftercare following surgical amputation: Secondary | ICD-10-CM | POA: Diagnosis not present

## 2014-03-26 DIAGNOSIS — I10 Essential (primary) hypertension: Secondary | ICD-10-CM | POA: Diagnosis not present

## 2014-03-26 DIAGNOSIS — J449 Chronic obstructive pulmonary disease, unspecified: Secondary | ICD-10-CM | POA: Diagnosis not present

## 2014-03-26 DIAGNOSIS — Z89411 Acquired absence of right great toe: Secondary | ICD-10-CM | POA: Diagnosis not present

## 2014-03-26 DIAGNOSIS — I70201 Unspecified atherosclerosis of native arteries of extremities, right leg: Secondary | ICD-10-CM | POA: Diagnosis not present

## 2014-03-27 ENCOUNTER — Ambulatory Visit (HOSPITAL_COMMUNITY)
Admission: RE | Admit: 2014-03-27 | Discharge: 2014-03-27 | Disposition: A | Discharge status: Home / Self Care | Payer: Medicare Other | Source: Ambulatory Visit | Attending: Vascular Surgery | Admitting: Vascular Surgery

## 2014-03-27 ENCOUNTER — Other Ambulatory Visit: Payer: Self-pay | Admitting: Vascular Surgery

## 2014-03-27 ENCOUNTER — Telehealth: Payer: Self-pay

## 2014-03-27 DIAGNOSIS — T82398D Other mechanical complication of other vascular grafts, subsequent encounter: Secondary | ICD-10-CM | POA: Diagnosis not present

## 2014-03-27 DIAGNOSIS — I70235 Atherosclerosis of native arteries of right leg with ulceration of other part of foot: Secondary | ICD-10-CM | POA: Diagnosis not present

## 2014-03-27 DIAGNOSIS — I70299 Other atherosclerosis of native arteries of extremities, unspecified extremity: Secondary | ICD-10-CM | POA: Insufficient documentation

## 2014-03-27 DIAGNOSIS — Z01810 Encounter for preprocedural cardiovascular examination: Secondary | ICD-10-CM

## 2014-03-27 DIAGNOSIS — Z89411 Acquired absence of right great toe: Secondary | ICD-10-CM | POA: Diagnosis not present

## 2014-03-27 DIAGNOSIS — L97909 Non-pressure chronic ulcer of unspecified part of unspecified lower leg with unspecified severity: Secondary | ICD-10-CM | POA: Diagnosis not present

## 2014-03-27 DIAGNOSIS — IMO0001 Reserved for inherently not codable concepts without codable children: Secondary | ICD-10-CM

## 2014-03-27 DIAGNOSIS — T814XXA Infection following a procedure, initial encounter: Principal | ICD-10-CM

## 2014-03-27 LAB — WOUND CULTURE
GRAM STAIN: NONE SEEN
Gram Stain: NONE SEEN

## 2014-03-27 LAB — BUN: BUN: 19 mg/dL (ref 6–23)

## 2014-03-27 LAB — CREATININE, SERUM: CREATININE: 1.3 mg/dL (ref 0.50–1.35)

## 2014-03-27 MED ORDER — CIPROFLOXACIN HCL 500 MG PO TABS: 500.0000 mg | ORAL_TABLET | Freq: Two times a day (BID) | ORAL | Status: DC

## 2014-03-27 NOTE — Telephone Encounter (Signed)
Phone call to pt.  Advised that Dr. Scot Dock has ordered a change in his antibiotic, after receiving results of the wound culture.  Advised to stop the Keflex and change to Cipro 500 mg po, BID.  Pt. Verb. Understanding.

## 2014-03-27 NOTE — Telephone Encounter (Signed)
-----   Message from Gena Fray sent at 03/27/2014 11:37 AM EST ----- Regarding: FW: antibiotics   ----- Message -----    From: Angelia Mould, MD    Sent: 03/27/2014  11:26 AM      To: [facility] Admin Pool Subject: antibiotics                                    His culture came back Pseudomonas. Can we change his antibiotics from Keflex to Cipro (500 mg po BID) thanks CD

## 2014-03-28 DIAGNOSIS — Z89411 Acquired absence of right great toe: Secondary | ICD-10-CM | POA: Diagnosis not present

## 2014-03-28 DIAGNOSIS — Z4781 Encounter for orthopedic aftercare following surgical amputation: Secondary | ICD-10-CM | POA: Diagnosis not present

## 2014-03-28 DIAGNOSIS — I10 Essential (primary) hypertension: Secondary | ICD-10-CM | POA: Diagnosis not present

## 2014-03-28 DIAGNOSIS — G8929 Other chronic pain: Secondary | ICD-10-CM | POA: Diagnosis not present

## 2014-03-28 DIAGNOSIS — I70201 Unspecified atherosclerosis of native arteries of extremities, right leg: Secondary | ICD-10-CM | POA: Diagnosis not present

## 2014-03-28 DIAGNOSIS — J449 Chronic obstructive pulmonary disease, unspecified: Secondary | ICD-10-CM | POA: Diagnosis not present

## 2014-03-30 ENCOUNTER — Telehealth: Payer: Self-pay | Admitting: Family Medicine

## 2014-03-30 ENCOUNTER — Ambulatory Visit: Payer: Medicare Other

## 2014-03-30 DIAGNOSIS — Z89411 Acquired absence of right great toe: Secondary | ICD-10-CM | POA: Diagnosis not present

## 2014-03-30 DIAGNOSIS — J449 Chronic obstructive pulmonary disease, unspecified: Secondary | ICD-10-CM | POA: Diagnosis not present

## 2014-03-30 DIAGNOSIS — Z4781 Encounter for orthopedic aftercare following surgical amputation: Secondary | ICD-10-CM | POA: Diagnosis not present

## 2014-03-30 DIAGNOSIS — I70201 Unspecified atherosclerosis of native arteries of extremities, right leg: Secondary | ICD-10-CM | POA: Diagnosis not present

## 2014-03-30 DIAGNOSIS — I10 Essential (primary) hypertension: Secondary | ICD-10-CM | POA: Diagnosis not present

## 2014-03-30 DIAGNOSIS — G8929 Other chronic pain: Secondary | ICD-10-CM | POA: Diagnosis not present

## 2014-03-31 ENCOUNTER — Other Ambulatory Visit: Payer: Self-pay | Admitting: Family Medicine

## 2014-03-31 ENCOUNTER — Ambulatory Visit
Admission: RE | Admit: 2014-03-31 | Discharge: 2014-03-31 | Disposition: A | Discharge status: Home / Self Care | Payer: Medicare Other | Source: Ambulatory Visit | Attending: Vascular Surgery | Admitting: Vascular Surgery

## 2014-03-31 DIAGNOSIS — I70299 Other atherosclerosis of native arteries of extremities, unspecified extremity: Secondary | ICD-10-CM

## 2014-03-31 DIAGNOSIS — K439 Ventral hernia without obstruction or gangrene: Secondary | ICD-10-CM | POA: Diagnosis not present

## 2014-03-31 DIAGNOSIS — L97909 Non-pressure chronic ulcer of unspecified part of unspecified lower leg with unspecified severity: Principal | ICD-10-CM

## 2014-03-31 DIAGNOSIS — Z01810 Encounter for preprocedural cardiovascular examination: Secondary | ICD-10-CM

## 2014-03-31 DIAGNOSIS — N280 Ischemia and infarction of kidney: Secondary | ICD-10-CM | POA: Diagnosis not present

## 2014-03-31 DIAGNOSIS — T82398D Other mechanical complication of other vascular grafts, subsequent encounter: Secondary | ICD-10-CM

## 2014-03-31 MED ORDER — IOHEXOL 350 MG/ML SOLN
165.0000 mL | Freq: Once | INTRAVENOUS | Status: AC | PRN
  Administered 2014-03-31: 165 mL via INTRAVENOUS

## 2014-03-31 MED ORDER — LOVASTATIN 20 MG PO TABS: 20.0000 mg | ORAL_TABLET | Freq: Every day | ORAL | Status: DC

## 2014-03-31 NOTE — Telephone Encounter (Signed)
Milda Smart, nurse with [facility], called with INR report:   Pt is currently taking warfarin - 7.5 mg - Mon & Fri;  5 mg - Sun, Tues, Wed, Thurs, Sat;    Per the nurse, pt is not having any bleeding, bruising, no missed doses or extra doses, no Hospital or ED visits, no diet changes,    Last Friday Pt started Cipro 500 mg twice a day, 10 days prescribed;   Protime seconds = 44.5  INR = 3.7  Advised nurse to have pt take 5 mg warfarin daily and recheck on 04-03-14 at regular scheduled visit;    Nurse repeats correct dose back to me.  Dosed per Dr. Nelly Rout, MLS (ASCP)cm

## 2014-03-31 NOTE — Telephone Encounter (Signed)
Needs refill on lovastatin [facility] [address] [place]

## 2014-04-01 DIAGNOSIS — Z4781 Encounter for orthopedic aftercare following surgical amputation: Secondary | ICD-10-CM | POA: Diagnosis not present

## 2014-04-01 DIAGNOSIS — I70201 Unspecified atherosclerosis of native arteries of extremities, right leg: Secondary | ICD-10-CM | POA: Diagnosis not present

## 2014-04-01 DIAGNOSIS — Z89411 Acquired absence of right great toe: Secondary | ICD-10-CM | POA: Diagnosis not present

## 2014-04-01 DIAGNOSIS — J449 Chronic obstructive pulmonary disease, unspecified: Secondary | ICD-10-CM | POA: Diagnosis not present

## 2014-04-01 DIAGNOSIS — G8929 Other chronic pain: Secondary | ICD-10-CM | POA: Diagnosis not present

## 2014-04-01 DIAGNOSIS — I10 Essential (primary) hypertension: Secondary | ICD-10-CM | POA: Diagnosis not present

## 2014-04-02 ENCOUNTER — Other Ambulatory Visit: Payer: Self-pay | Admitting: Family Medicine

## 2014-04-02 NOTE — Telephone Encounter (Signed)
Pt checking status of refill on potassium

## 2014-04-03 ENCOUNTER — Telehealth: Payer: Self-pay | Admitting: Family Medicine

## 2014-04-03 DIAGNOSIS — G8929 Other chronic pain: Secondary | ICD-10-CM | POA: Diagnosis not present

## 2014-04-03 DIAGNOSIS — Z89411 Acquired absence of right great toe: Secondary | ICD-10-CM | POA: Diagnosis not present

## 2014-04-03 DIAGNOSIS — Z4781 Encounter for orthopedic aftercare following surgical amputation: Secondary | ICD-10-CM | POA: Diagnosis not present

## 2014-04-03 DIAGNOSIS — I10 Essential (primary) hypertension: Secondary | ICD-10-CM | POA: Diagnosis not present

## 2014-04-03 DIAGNOSIS — J449 Chronic obstructive pulmonary disease, unspecified: Secondary | ICD-10-CM | POA: Diagnosis not present

## 2014-04-03 DIAGNOSIS — I70201 Unspecified atherosclerosis of native arteries of extremities, right leg: Secondary | ICD-10-CM | POA: Diagnosis not present

## 2014-04-03 MED ORDER — POTASSIUM CHLORIDE CRYS ER 20 MEQ PO TBCR: 20.0000 meq | EXTENDED_RELEASE_TABLET | Freq: Every day | ORAL | Status: DC

## 2014-04-03 NOTE — Telephone Encounter (Signed)
Need to have refill for potassium sent today please per patient

## 2014-04-03 NOTE — Telephone Encounter (Signed)
Deanna Wheat, nurse with [facility], called with INR report:   Pt is currently taking warfarin -  5 mg - daily;    Per the nurse, pt is not having any bleeding, no missed doses or extra doses, no Hospital or ED visits, no diet changes,  Has few bruises on hands but does not think related to warfarin.  Had CT scan last Tues 03-31-14.  His right arm is improving from the IV infiltrate while he was in hospital.      Last Friday Pt started Cipro 500 mg twice a day, 10 days prescribed;  Pt has 3 days remaining of Cipro;  Protime seconds = 38.0   INR = 3.2  Advised nurse to have pt continue to take 5 mg warfarin daily and recheck on Monday 04-06-14 at next regular scheduled visit;    Nurse repeats correct dose back to me.  Dosed per Dr. Lacinda Axon

## 2014-04-06 DIAGNOSIS — G8929 Other chronic pain: Secondary | ICD-10-CM | POA: Diagnosis not present

## 2014-04-06 DIAGNOSIS — J449 Chronic obstructive pulmonary disease, unspecified: Secondary | ICD-10-CM | POA: Diagnosis not present

## 2014-04-06 DIAGNOSIS — I10 Essential (primary) hypertension: Secondary | ICD-10-CM | POA: Diagnosis not present

## 2014-04-06 DIAGNOSIS — Z89411 Acquired absence of right great toe: Secondary | ICD-10-CM | POA: Diagnosis not present

## 2014-04-06 DIAGNOSIS — I70201 Unspecified atherosclerosis of native arteries of extremities, right leg: Secondary | ICD-10-CM | POA: Diagnosis not present

## 2014-04-06 DIAGNOSIS — Z4781 Encounter for orthopedic aftercare following surgical amputation: Secondary | ICD-10-CM | POA: Diagnosis not present

## 2014-04-07 ENCOUNTER — Telehealth: Payer: Self-pay | Admitting: Family Medicine

## 2014-04-07 ENCOUNTER — Encounter: Payer: Self-pay | Admitting: Vascular Surgery

## 2014-04-07 NOTE — Telephone Encounter (Signed)
Deanna Wheat, nurse with [facility], called with INR report:   Pt is currently taking warfarin - 5 mg - daily;    Per the nurse, pt is not having any bleeding, bruising, no missed doses or extra doses, no Hospital or ED visits, no diet changes,    Last Friday Pt started Cipro 500 mg twice a day, 10 days prescribed;  Pt has 1 dose remaining.   Pt ate "whole plate of cabbage yesterday" Protime seconds = 22.0  INR = 1.8  Advised nurse to have pt take warfarin 7.5 mg - Mon & Fri;  5 mg - other days and recheck on 2 weeks at regular scheduled visit;    Nurse repeats correct dose back to me.  Dosed per Dr. Lacinda Axon

## 2014-04-07 NOTE — Telephone Encounter (Signed)
Called pt and gave him the new warfarin dose = 7.5 mg - Mon & Fri;  5 mg - other days,  Since today is Tues (missed changing on Mon) will have pt take 7.5 mg tonight then resume regular schedule.  Pt repeats the dosing instructions back to me and states he fully understands.    Also called [name] nurse with dosing instructions and to recheck 1 week 04-13-14

## 2014-04-08 ENCOUNTER — Encounter: Payer: Self-pay | Admitting: Vascular Surgery

## 2014-04-08 ENCOUNTER — Ambulatory Visit (INDEPENDENT_AMBULATORY_CARE_PROVIDER_SITE_OTHER): Payer: Self-pay | Admitting: Vascular Surgery

## 2014-04-08 VITALS — BP 112/63 | HR 75 | Ht 75.5 in | Wt 299.0 lb

## 2014-04-08 DIAGNOSIS — Z48812 Encounter for surgical aftercare following surgery on the circulatory system: Secondary | ICD-10-CM

## 2014-04-08 MED ORDER — CIPROFLOXACIN HCL 500 MG PO TABS: 500.0000 mg | ORAL_TABLET | Freq: Two times a day (BID) | ORAL | Status: DC

## 2014-04-08 NOTE — Progress Notes (Signed)
Patient name: Terry Martinez MRN: [id number] DOB: 04/04/1940 Sex: male  REASON FOR VISIT: follow up of right foot wound  HPI: Terry Martinez is a 74 y.o. male who underwent revascularization in [place]. He had a left axillobifemoral bypass graft approximately 4 years ago. 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 I performed a ray amputation of the right great toe on 02/19/2014. Initially this appeared to be healing but subsequently he developed an opening and this is now being packed. I last saw him on 03/25/2014. A duplex scan at that time showed that his left axillobifemoral bypass graft was patent. There were some elevated velocities at the proximal anastomosis. He had an ABI of 69% on the right and 86% on the left. He had a culture of this wound which grew pseudomonas which is sensitive to Cipro which she is now on. He states that since he has been on Cipro the wound seems to be improving.  He has also been evaluated by Dr. Sharol Given and he is doing dressing changes to the wound with Silvadene. In addition he has a wound on the lateral aspect of his foot over the fifth metatarsal head. He denies fever or chills. I'm somewhat concerned about cannulating his axillofemoral graft given the risk of infection although this risk is fairly small. For this reason I recommended a CT angiogram to evaluate his graft. He comes in today after the arteriogram.  REVIEW OF SYSTEMS: Valu.Nieves ] denotes positive finding; [  ] denotes negative finding  CARDIOVASCULAR:  [ ]  chest pain   [ ]  dyspnea on exertion    CONSTITUTIONAL:  [ ]  fever   [ ]  chills  PHYSICAL EXAM: Filed Vitals:   04/08/14 1324  BP: 112/63  Pulse: 75  Height: 6' 3.5" (1.918 m)  Weight: 299 lb (135.626 kg)  SpO2: 95%   Body mass index is 36.87 kg/(m^2). GENERAL: The patient is a well-nourished male, in no acute distress. The vital signs are documented above. CARDIOVASCULAR: There is a regular rate  and rhythm. PULMONARY: There is good air exchange bilaterally without wheezing or rales. He has monophasic Doppler signals in the right foot. The erythema in the right foot has improved. There is a full-thickness wound over the lateral aspect of his foot over the fifth metatarsal head. There is no drainage here. The toe amputation site wound is fairly deep but currently there is no significant drainage.  I have reviewed his CT scan and the images. 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 His left axillobifemoral bypass graft is patent. There is a stent proximal to the anastomosis that is patent. The subclavian artery is pulled down somewhat by the graft however when the images were taken his arms were elevated above his head which explains this. He does not appear to have any significant femoral popliteal artery occlusive disease bilaterally. It is difficult to visualize the tibial vessels.  I have also reviewed his vein map which was performed on 03/27/2014. He appears to have a reasonable greater saphenous vein on the right if he did require a bypass on the right.  MEDICAL ISSUES: Given that the wound on his right foot appears to be improving we will continue with dressing changes for now. He has finished Cipro yesterday so I have written him for more Cipro given that this seems to be helping significantly and given that his culture grew Pseudomonas. His Coumadin is being followed closely. If the wound fails to continue to  improve that I think we would need to proceed with an arteriogram and cannulate his axle bifemoral bypass graft in order to determine if he is a candidate for revascularization. Based on his CT angiogram, he does not appear to have significant disease above the level of the popliteal however the tibial vessels could not be adequately visualized. I'll plan on seeing him back in 2 months. He knows to call sooner if he has problems. If he does require bypass it appears that he has adequate  vein in the right leg.  [facility] [place] Beeper: [phone]

## 2014-04-09 DIAGNOSIS — I70201 Unspecified atherosclerosis of native arteries of extremities, right leg: Secondary | ICD-10-CM | POA: Diagnosis not present

## 2014-04-09 DIAGNOSIS — Z4781 Encounter for orthopedic aftercare following surgical amputation: Secondary | ICD-10-CM | POA: Diagnosis not present

## 2014-04-09 DIAGNOSIS — Z89411 Acquired absence of right great toe: Secondary | ICD-10-CM | POA: Diagnosis not present

## 2014-04-09 DIAGNOSIS — J449 Chronic obstructive pulmonary disease, unspecified: Secondary | ICD-10-CM | POA: Diagnosis not present

## 2014-04-09 DIAGNOSIS — I10 Essential (primary) hypertension: Secondary | ICD-10-CM | POA: Diagnosis not present

## 2014-04-09 DIAGNOSIS — G8929 Other chronic pain: Secondary | ICD-10-CM | POA: Diagnosis not present

## 2014-04-10 DIAGNOSIS — I70201 Unspecified atherosclerosis of native arteries of extremities, right leg: Secondary | ICD-10-CM | POA: Diagnosis not present

## 2014-04-10 DIAGNOSIS — G8929 Other chronic pain: Secondary | ICD-10-CM | POA: Diagnosis not present

## 2014-04-10 DIAGNOSIS — Z4781 Encounter for orthopedic aftercare following surgical amputation: Secondary | ICD-10-CM | POA: Diagnosis not present

## 2014-04-10 DIAGNOSIS — I10 Essential (primary) hypertension: Secondary | ICD-10-CM | POA: Diagnosis not present

## 2014-04-10 DIAGNOSIS — J449 Chronic obstructive pulmonary disease, unspecified: Secondary | ICD-10-CM | POA: Diagnosis not present

## 2014-04-10 DIAGNOSIS — Z89411 Acquired absence of right great toe: Secondary | ICD-10-CM | POA: Diagnosis not present

## 2014-04-13 ENCOUNTER — Telehealth: Payer: Self-pay | Admitting: *Deleted

## 2014-04-13 DIAGNOSIS — J449 Chronic obstructive pulmonary disease, unspecified: Secondary | ICD-10-CM | POA: Diagnosis not present

## 2014-04-13 DIAGNOSIS — Z4781 Encounter for orthopedic aftercare following surgical amputation: Secondary | ICD-10-CM | POA: Diagnosis not present

## 2014-04-13 DIAGNOSIS — Z89411 Acquired absence of right great toe: Secondary | ICD-10-CM | POA: Diagnosis not present

## 2014-04-13 DIAGNOSIS — I10 Essential (primary) hypertension: Secondary | ICD-10-CM | POA: Diagnosis not present

## 2014-04-13 DIAGNOSIS — G8929 Other chronic pain: Secondary | ICD-10-CM | POA: Diagnosis not present

## 2014-04-13 DIAGNOSIS — I70201 Unspecified atherosclerosis of native arteries of extremities, right leg: Secondary | ICD-10-CM | POA: Diagnosis not present

## 2014-04-13 NOTE — Telephone Encounter (Signed)
Received call from [place] with [facility] with an INR report for Mr [facility]. INR 2.8 with 33.7 sec. He is currently taking cipro. Started taking on 04/09/14 through 04/19/14. No other changes to report. His current dose is 7.5 mg on Mon and Fri; 5 mg the rest of the week. Will have him hold Monday and Friday's dose of 7.5 mg and take 5 mg on Tues, Wed, Thurs, Sat, and Sun and recheck on Monday per Dr Macario Golds instructions.

## 2014-04-15 DIAGNOSIS — G8929 Other chronic pain: Secondary | ICD-10-CM | POA: Diagnosis not present

## 2014-04-15 DIAGNOSIS — I10 Essential (primary) hypertension: Secondary | ICD-10-CM | POA: Diagnosis not present

## 2014-04-15 DIAGNOSIS — I70201 Unspecified atherosclerosis of native arteries of extremities, right leg: Secondary | ICD-10-CM | POA: Diagnosis not present

## 2014-04-15 DIAGNOSIS — Z4781 Encounter for orthopedic aftercare following surgical amputation: Secondary | ICD-10-CM | POA: Diagnosis not present

## 2014-04-15 DIAGNOSIS — Z89411 Acquired absence of right great toe: Secondary | ICD-10-CM | POA: Diagnosis not present

## 2014-04-15 DIAGNOSIS — J449 Chronic obstructive pulmonary disease, unspecified: Secondary | ICD-10-CM | POA: Diagnosis not present

## 2014-04-16 DIAGNOSIS — B351 Tinea unguium: Secondary | ICD-10-CM | POA: Diagnosis not present

## 2014-04-16 DIAGNOSIS — L97411 Non-pressure chronic ulcer of right heel and midfoot limited to breakdown of skin: Secondary | ICD-10-CM | POA: Diagnosis not present

## 2014-04-17 DIAGNOSIS — J449 Chronic obstructive pulmonary disease, unspecified: Secondary | ICD-10-CM | POA: Diagnosis not present

## 2014-04-17 DIAGNOSIS — I10 Essential (primary) hypertension: Secondary | ICD-10-CM | POA: Diagnosis not present

## 2014-04-17 DIAGNOSIS — Z89411 Acquired absence of right great toe: Secondary | ICD-10-CM | POA: Diagnosis not present

## 2014-04-17 DIAGNOSIS — Z4781 Encounter for orthopedic aftercare following surgical amputation: Secondary | ICD-10-CM | POA: Diagnosis not present

## 2014-04-17 DIAGNOSIS — G8929 Other chronic pain: Secondary | ICD-10-CM | POA: Diagnosis not present

## 2014-04-17 DIAGNOSIS — I70201 Unspecified atherosclerosis of native arteries of extremities, right leg: Secondary | ICD-10-CM | POA: Diagnosis not present

## 2014-04-19 ENCOUNTER — Other Ambulatory Visit: Payer: Self-pay | Admitting: Family Medicine

## 2014-04-20 ENCOUNTER — Telehealth: Payer: Self-pay | Admitting: *Deleted

## 2014-04-20 DIAGNOSIS — Z4781 Encounter for orthopedic aftercare following surgical amputation: Secondary | ICD-10-CM | POA: Diagnosis not present

## 2014-04-20 DIAGNOSIS — Z89411 Acquired absence of right great toe: Secondary | ICD-10-CM | POA: Diagnosis not present

## 2014-04-20 DIAGNOSIS — I70201 Unspecified atherosclerosis of native arteries of extremities, right leg: Secondary | ICD-10-CM | POA: Diagnosis not present

## 2014-04-20 DIAGNOSIS — J449 Chronic obstructive pulmonary disease, unspecified: Secondary | ICD-10-CM | POA: Diagnosis not present

## 2014-04-20 DIAGNOSIS — G8929 Other chronic pain: Secondary | ICD-10-CM | POA: Diagnosis not present

## 2014-04-20 DIAGNOSIS — I10 Essential (primary) hypertension: Secondary | ICD-10-CM | POA: Diagnosis not present

## 2014-04-20 NOTE — Telephone Encounter (Signed)
Received call from [facility] with INR results on Mr [facility]. INR 2.1 and 24.7 sec. He finished his Cipro yesterday. He held Monday's and Friday's dose of 7.5 mg last week to counteract the antibiotic. Will have him go back to his regular dose of 7.5 mg on Monday and Friday and 5 mg the rest of the week and recheck on Monday.Busick, Kevin Fenton

## 2014-04-22 DIAGNOSIS — I10 Essential (primary) hypertension: Secondary | ICD-10-CM | POA: Diagnosis not present

## 2014-04-22 DIAGNOSIS — Z4781 Encounter for orthopedic aftercare following surgical amputation: Secondary | ICD-10-CM | POA: Diagnosis not present

## 2014-04-22 DIAGNOSIS — G8929 Other chronic pain: Secondary | ICD-10-CM | POA: Diagnosis not present

## 2014-04-22 DIAGNOSIS — J449 Chronic obstructive pulmonary disease, unspecified: Secondary | ICD-10-CM | POA: Diagnosis not present

## 2014-04-22 DIAGNOSIS — Z89411 Acquired absence of right great toe: Secondary | ICD-10-CM | POA: Diagnosis not present

## 2014-04-22 DIAGNOSIS — I70201 Unspecified atherosclerosis of native arteries of extremities, right leg: Secondary | ICD-10-CM | POA: Diagnosis not present

## 2014-04-23 DIAGNOSIS — L97411 Non-pressure chronic ulcer of right heel and midfoot limited to breakdown of skin: Secondary | ICD-10-CM | POA: Diagnosis not present

## 2014-04-23 DIAGNOSIS — B351 Tinea unguium: Secondary | ICD-10-CM | POA: Diagnosis not present

## 2014-04-23 DIAGNOSIS — I70235 Atherosclerosis of native arteries of right leg with ulceration of other part of foot: Secondary | ICD-10-CM | POA: Diagnosis not present

## 2014-04-23 DIAGNOSIS — Z89411 Acquired absence of right great toe: Secondary | ICD-10-CM | POA: Diagnosis not present

## 2014-04-24 DIAGNOSIS — Z4781 Encounter for orthopedic aftercare following surgical amputation: Secondary | ICD-10-CM | POA: Diagnosis not present

## 2014-04-24 DIAGNOSIS — I70201 Unspecified atherosclerosis of native arteries of extremities, right leg: Secondary | ICD-10-CM | POA: Diagnosis not present

## 2014-04-24 DIAGNOSIS — Z89411 Acquired absence of right great toe: Secondary | ICD-10-CM | POA: Diagnosis not present

## 2014-04-24 DIAGNOSIS — J449 Chronic obstructive pulmonary disease, unspecified: Secondary | ICD-10-CM | POA: Diagnosis not present

## 2014-04-24 DIAGNOSIS — I10 Essential (primary) hypertension: Secondary | ICD-10-CM | POA: Diagnosis not present

## 2014-04-24 DIAGNOSIS — G8929 Other chronic pain: Secondary | ICD-10-CM | POA: Diagnosis not present

## 2014-04-25 DIAGNOSIS — I70201 Unspecified atherosclerosis of native arteries of extremities, right leg: Secondary | ICD-10-CM | POA: Diagnosis not present

## 2014-04-25 DIAGNOSIS — I4891 Unspecified atrial fibrillation: Secondary | ICD-10-CM | POA: Diagnosis not present

## 2014-04-25 DIAGNOSIS — J449 Chronic obstructive pulmonary disease, unspecified: Secondary | ICD-10-CM | POA: Diagnosis not present

## 2014-04-25 DIAGNOSIS — G8929 Other chronic pain: Secondary | ICD-10-CM | POA: Diagnosis not present

## 2014-04-25 DIAGNOSIS — Z89411 Acquired absence of right great toe: Secondary | ICD-10-CM | POA: Diagnosis not present

## 2014-04-25 DIAGNOSIS — Z5181 Encounter for therapeutic drug level monitoring: Secondary | ICD-10-CM | POA: Diagnosis not present

## 2014-04-25 DIAGNOSIS — I252 Old myocardial infarction: Secondary | ICD-10-CM | POA: Diagnosis not present

## 2014-04-25 DIAGNOSIS — Z7901 Long term (current) use of anticoagulants: Secondary | ICD-10-CM | POA: Diagnosis not present

## 2014-04-25 DIAGNOSIS — I10 Essential (primary) hypertension: Secondary | ICD-10-CM | POA: Diagnosis not present

## 2014-04-25 DIAGNOSIS — Z4781 Encounter for orthopedic aftercare following surgical amputation: Secondary | ICD-10-CM | POA: Diagnosis not present

## 2014-04-25 DIAGNOSIS — Z87891 Personal history of nicotine dependence: Secondary | ICD-10-CM | POA: Diagnosis not present

## 2014-04-25 DIAGNOSIS — L89893 Pressure ulcer of other site, stage 3: Secondary | ICD-10-CM | POA: Diagnosis not present

## 2014-04-27 ENCOUNTER — Telehealth: Payer: Self-pay | Admitting: Family Medicine

## 2014-04-27 DIAGNOSIS — L89893 Pressure ulcer of other site, stage 3: Secondary | ICD-10-CM | POA: Diagnosis not present

## 2014-04-27 DIAGNOSIS — J449 Chronic obstructive pulmonary disease, unspecified: Secondary | ICD-10-CM | POA: Diagnosis not present

## 2014-04-27 DIAGNOSIS — I10 Essential (primary) hypertension: Secondary | ICD-10-CM | POA: Diagnosis not present

## 2014-04-27 DIAGNOSIS — G8929 Other chronic pain: Secondary | ICD-10-CM | POA: Diagnosis not present

## 2014-04-27 DIAGNOSIS — I70201 Unspecified atherosclerosis of native arteries of extremities, right leg: Secondary | ICD-10-CM | POA: Diagnosis not present

## 2014-04-27 DIAGNOSIS — Z4781 Encounter for orthopedic aftercare following surgical amputation: Secondary | ICD-10-CM | POA: Diagnosis not present

## 2014-04-28 ENCOUNTER — Encounter: Payer: Self-pay | Admitting: Vascular Surgery

## 2014-04-28 NOTE — Telephone Encounter (Signed)
Deanna Wheat, nurse with [facility], called with INR report:   Pt is currently taking warfarin - 7.5 mg - Mon & Fri;  5 mg - Sun, Tues, Wed, Thurs, Sat;    Per the nurse, pt is not having any bruising, no extra doses, no Hospital or ED visits, no diet or medication changes,    pt has finished antibiotics, had 1 nose bleed last week; he missed last nights warfarin dose  Protime seconds = 28.1  INR = 2.3  Because he missed dose (also afraid he will get too thin on present dose using 7.5 mg), advised nurse to have pt take warfarin 7.5 mg today then go to 5 mg daily and recheck 1 week;    Nurse repeats correct dose back to me.   Dosed per Dr. Lacinda Axon

## 2014-04-29 ENCOUNTER — Encounter: Payer: Self-pay | Admitting: Vascular Surgery

## 2014-04-29 ENCOUNTER — Other Ambulatory Visit: Payer: Self-pay

## 2014-04-29 ENCOUNTER — Ambulatory Visit (INDEPENDENT_AMBULATORY_CARE_PROVIDER_SITE_OTHER): Payer: Self-pay | Admitting: Vascular Surgery

## 2014-04-29 ENCOUNTER — Other Ambulatory Visit: Payer: Self-pay | Admitting: *Deleted

## 2014-04-29 VITALS — BP 136/54 | HR 63 | Temp 97.5°F | Ht 75.5 in | Wt 294.0 lb

## 2014-04-29 DIAGNOSIS — L89893 Pressure ulcer of other site, stage 3: Secondary | ICD-10-CM | POA: Diagnosis not present

## 2014-04-29 DIAGNOSIS — G8929 Other chronic pain: Secondary | ICD-10-CM | POA: Diagnosis not present

## 2014-04-29 DIAGNOSIS — I70201 Unspecified atherosclerosis of native arteries of extremities, right leg: Secondary | ICD-10-CM | POA: Diagnosis not present

## 2014-04-29 DIAGNOSIS — Z4781 Encounter for orthopedic aftercare following surgical amputation: Secondary | ICD-10-CM | POA: Diagnosis not present

## 2014-04-29 DIAGNOSIS — I10 Essential (primary) hypertension: Secondary | ICD-10-CM | POA: Diagnosis not present

## 2014-04-29 DIAGNOSIS — I70299 Other atherosclerosis of native arteries of extremities, unspecified extremity: Secondary | ICD-10-CM

## 2014-04-29 DIAGNOSIS — J449 Chronic obstructive pulmonary disease, unspecified: Secondary | ICD-10-CM | POA: Diagnosis not present

## 2014-04-29 DIAGNOSIS — L97909 Non-pressure chronic ulcer of unspecified part of unspecified lower leg with unspecified severity: Secondary | ICD-10-CM

## 2014-04-29 MED ORDER — CIPROFLOXACIN HCL 500 MG PO TABS: 500.0000 mg | ORAL_TABLET | Freq: Two times a day (BID) | ORAL | Status: DC

## 2014-04-29 NOTE — Addendum Note (Signed)
Addended by: Mena Goes on: 04/29/2014 05:28 PM   Modules accepted: Orders, Medications

## 2014-04-29 NOTE — Progress Notes (Signed)
Vascular and Vein Specialist of [facility]  Patient name: Terry Martinez MRN: [id number] DOB: 06-Jun-1940 Sex: male  REASON FOR VISIT: Follow up of right foot wound.  HPI: Terry Martinez is a 74 y.o. male who underwent revascularization in [place]. He had a left axillobifemoral bypass graft approximately 4 years ago. 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 I performed a ray amputation of the right great toe on 02/19/2014. Initially this appeared to be healing but subsequently he developed an opening and this is now being packed. I last saw him on 03/25/2014. A duplex scan at that time showed that his left axillobifemoral bypass graft was patent. There were some elevated velocities at the proximal anastomosis. He had an ABI of 69% on the right and 86% on the left. He had a culture of this wound which grew pseudomonas which is sensitive to Cipro which she is now on. He states that since he has been on Cipro the wound seems to be improving.  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 His left axillobifemoral bypass graft is patent. There is a stent in the left subclavian artery proximal to the anastomosis which is patent. He did not appear to have any significant femoral popliteal artery occlusive disease bilaterally although was difficult to visualize his tibial vessels. He has also undergone a vein map which shows that he does appear to have a reasonable greater saphenous vein on the right if he did require a bypass.  When I saw him last on 04/08/2014, wound in the right foot appeared to be improving. He was on Cipro as the wound had grown Pseudomonas. He is also on Coumadin and this was being followed closely.  He has also been evaluated by Dr. Sharol Given and he is doing dressing changes to the wound with Silvadene. He debridement the lateral wound on his foot at his last visit. He states that he is continuing to have  some drainage from the wound over his fifth toe amputation site. He denies fever.  Past Medical History  Diagnosis Date  . Hyperlipidemia   . Hypertension   . COPD (chronic obstructive pulmonary disease)   . Peripheral artery disease   . Atrial fibrillation   . Chronic pain   . Pneumonia   . Kidney stones   . Headache     occasional  . Cancer     squamous cell carcinoma - left arm  . Anemia     hx low iron  . Myocardial infarction     3 stents  . Umbilical hernia   . Family history of adverse reaction to anesthesia     sister has difficulty waking up   Family History  Problem Relation Age of Onset  . Diabetes Mother   . Cancer Mother     Right Breast  . Heart disease Mother   . Hyperlipidemia Mother   . Hypertension Mother   . Lymphoma Mother     Chemo  . Cancer Father   . Cancer Brother   . Heart disease Brother   . Depression Brother   . Early death Brother   . Hyperlipidemia Brother   . Hypertension Brother   . Alcohol abuse Sister   . Heart disease Sister   . Hyperlipidemia Sister   . Hypertension Sister   . Stroke Sister   . Heart disease Maternal Grandmother   . Kidney disease Maternal Grandmother   .  Heart disease Maternal Grandfather   . Kidney disease Maternal Grandfather    SOCIAL HISTORY: History  Substance Use Topics  . Smoking status: Former Smoker    Types: Cigarettes    Quit date: 07/22/1998  . Smokeless tobacco: Current User    Types: Snuff  . Alcohol Use: No   Allergies  Allergen Reactions  . Zocor [Simvastatin] Hives   Current Outpatient Prescriptions  Medication Sig Dispense Refill  . acetaminophen (TYLENOL) 500 MG tablet Take 500 mg by mouth every 6 (six) hours as needed for pain.    [name] albuterol (PROVENTIL HFA;VENTOLIN HFA) 108 (90 BASE) MCG/ACT inhaler Inhale 2 puffs into the lungs every 6 (six) hours as needed for wheezing or shortness of breath. 1 Inhaler 2  . allopurinol (ZYLOPRIM) 100 MG tablet TAKE ONE TABLET BY MOUTH IN  THE EVENING 90 tablet 0  . allopurinol (ZYLOPRIM) 300 MG tablet Take 1 tablet (300 mg total) by mouth every morning. 90 tablet 3  . amoxicillin-clavulanate (AUGMENTIN) 875-125 MG per tablet Take 1 tablet by mouth 2 (two) times daily. 20 tablet 0  . cephALEXin (KEFLEX) 500 MG capsule Take 1 capsule (500 mg total) by mouth 3 (three) times daily. 21 capsule 0  . ciprofloxacin (CIPRO) 500 MG tablet Take 1 tablet (500 mg total) by mouth 2 (two) times daily. 20 tablet 0  . ciprofloxacin (CIPRO) 500 MG tablet Take 1 tablet (500 mg total) by mouth 2 (two) times daily. 20 tablet 0  . enalapril (VASOTEC) 10 MG tablet Take 1 tablet (10 mg total) by mouth daily. 90 tablet 3  . enoxaparin (LOVENOX) 150 MG/ML injection Inject 0.87 mLs (130 mg total) into the skin every 12 (twelve) hours. 14 mL 0  . EPINEPHrine (EPI-PEN) 0.3 mg/0.3 mL SOAJ injection Inject 0.3 mLs (0.3 mg total) into the muscle once. 1 Device 1  . ferrous fumarate (HEMOCYTE - 106 MG FE) 325 (106 FE) MG TABS tablet Take 1 tablet by mouth every evening.    . furosemide (LASIX) 40 MG tablet Take 1 tablet (40 mg total) by mouth daily. 30 tablet 3  . HYDROcodone-acetaminophen (NORCO) 5-325 MG per tablet Take 1 tablet by mouth every 6 (six) hours as needed for moderate pain. 20 tablet 0  . lovastatin (MEVACOR) 20 MG tablet Take 1 tablet (20 mg total) by mouth at bedtime. 90 tablet 2  . metoprolol tartrate (LOPRESSOR) 25 MG tablet Take 0.5 tablets (12.5 mg total) by mouth 2 (two) times daily. 60 tablet 6  . Multiple Vitamins-Minerals (MENS MULTIVITAMIN PLUS PO) Take 1 tablet by mouth daily.    . nitroGLYCERIN (NITROSTAT) 0.4 MG SL tablet Place 1 tablet (0.4 mg total) under the tongue every 5 (five) minutes as needed for chest pain. 90 tablet 3  . polyethylene glycol powder (GLYCOLAX/MIRALAX) powder Take 17 g by mouth 2 (two) times daily as needed. 527 g 3  . potassium chloride SA (K-DUR,KLOR-CON) 20 MEQ tablet Take 1 tablet (20 mEq total) by mouth daily.  90 tablet 3  . Silver Sulfadiazine (SILVADENE EX) Apply topically.    . tamsulosin (FLOMAX) 0.4 MG CAPS capsule TAKE ONE CAPSULE BY MOUTH EVERY DAY    . warfarin (COUMADIN) 5 MG tablet Take 1 tablet (5 mg total) by mouth daily. Restart 1 tablet 5 mg daily on 02/22/13 90 tablet 3  . zoster vaccine live, PF, (ZOSTAVAX) 37858 UNT/0.65ML injection Inject 19,400 Units into the skin once. 1 each 0   No current facility-administered medications for this visit.  REVIEW OF SYSTEMS: Valu.Nieves ] denotes positive finding; [  ] denotes negative finding  CARDIOVASCULAR:  [ ]  chest pain   [ ]  chest pressure   Valu.Nieves ] palpitations   Valu.Nieves ] orthopnea   Valu.Nieves ] dyspnea on exertion   Valu.Nieves ] claudication   [ ]  rest pain   [ ]  DVT   [ ]  phlebitis PULMONARY:   [ ]  productive cough   [ ]  asthma   [ ]  wheezing NEUROLOGIC:   [ ]  weakness  [ ]  paresthesias  [ ]  aphasia  [ ]  amaurosis  [ ]  dizziness HEMATOLOGIC:   [ ]  bleeding problems   [ ]  clotting disorders MUSCULOSKELETAL:  [ ]  joint pain   [ ]  joint swelling Valu.Nieves ] leg swelling GASTROINTESTINAL: [ ]   blood in stool  [ ]   hematemesis GENITOURINARY:  [ ]   dysuria  [ ]   hematuria PSYCHIATRIC:  [ ]  history of major depression INTEGUMENTARY:  [ ]  rashes  [ ]  ulcers CONSTITUTIONAL:  [ ]  fever   [ ]  chills  PHYSICAL EXAM: Filed Vitals:   04/29/14 1110  BP: 136/54  Pulse: 63  Temp: 97.5 F (36.4 C)  TempSrc: Oral  Height: 6' 3.5" (1.918 m)  Weight: 294 lb (133.358 kg)  SpO2: 97%   Body mass index is 36.25 kg/(m^2). GENERAL: The patient is a well-nourished male, in no acute distress. The vital signs are documented above. CARDIOVASCULAR: There is a regular rate and rhythm. Is difficult to palpate his femoral pulses because of his size. I cannot palpate pedal pulses. PULMONARY: There is good air exchange bilaterally without wheezing or rales. ABDOMEN: Soft and non-tender with normal pitched bowel sounds.  MUSCULOSKELETAL: There are no major deformities or cyanosis. NEUROLOGIC: No  focal weakness or paresthesias are detected. SKIN: the small wound at the site of his right great toe amputation is approximate 2 mm in diameter but approximately the depth of the Q-tip. The wound on the lateral aspect of foot has exposed fascia with the bone close by. PSYCHIATRIC: The patient has a normal affect.  DATA:  I reviewed his CT angiogram which shows that he does not appear to have any significant issues with his axilla bifemoral bypass graft except for some mildly elevated velocities at the proximal anastomosis. The femoral and popliteal vessels are patent on the right but the tibial vessels could not be visualized.  MEDICAL ISSUES: Given that the wound on the right foot is not progressing along the lateral aspect, I have recommended that we proceed with an arteriogram which will require cannulation of his axillofemoral graft. We have discussed the indications for the procedure and the potential complications including but not limited to bleeding, graft thrombosis, injury to the graft or infection. We'll be old Telfa and this if he is a candidate for a bypass or if there is any options from an endovascular standpoint. If he is a candidate for an endovascular approach this could potentially be done at the same time. He has undergone previous vein mapping which showed a reasonable right greater saphenous vein. We will stop his Coumadin prior to the procedure which is scheduled for 05/04/2014. In addition given that he is having continued drainage from the great toe site I have restarted him on Cipro. I'll make further recommendations. Results of his arteriogram.   Terry Martinez S [facility] Specialists of [place]: [phone]

## 2014-05-01 ENCOUNTER — Telehealth: Payer: Self-pay | Admitting: Family Medicine

## 2014-05-01 DIAGNOSIS — I70201 Unspecified atherosclerosis of native arteries of extremities, right leg: Secondary | ICD-10-CM | POA: Diagnosis not present

## 2014-05-01 DIAGNOSIS — J449 Chronic obstructive pulmonary disease, unspecified: Secondary | ICD-10-CM | POA: Diagnosis not present

## 2014-05-01 DIAGNOSIS — I10 Essential (primary) hypertension: Secondary | ICD-10-CM | POA: Diagnosis not present

## 2014-05-01 DIAGNOSIS — Z4781 Encounter for orthopedic aftercare following surgical amputation: Secondary | ICD-10-CM | POA: Diagnosis not present

## 2014-05-01 DIAGNOSIS — L89893 Pressure ulcer of other site, stage 3: Secondary | ICD-10-CM | POA: Diagnosis not present

## 2014-05-01 DIAGNOSIS — G8929 Other chronic pain: Secondary | ICD-10-CM | POA: Diagnosis not present

## 2014-05-01 NOTE — Telephone Encounter (Signed)
Deanna Wheat, nurse with [facility] called with information/update on pt:   Pt stopped warfarin 04-30-14 to have lower extremity angiogram on Monday;  Also started Cipro on 04-29-14

## 2014-05-04 ENCOUNTER — Encounter (HOSPITAL_COMMUNITY): Payer: Self-pay | Admitting: Vascular Surgery

## 2014-05-04 ENCOUNTER — Telehealth: Payer: Self-pay | Admitting: Vascular Surgery

## 2014-05-04 ENCOUNTER — Encounter (HOSPITAL_COMMUNITY)
Admission: RE | Disposition: A | Discharge status: Home / Self Care | Payer: Self-pay | Source: Ambulatory Visit | Attending: Vascular Surgery

## 2014-05-04 ENCOUNTER — Other Ambulatory Visit: Payer: Self-pay

## 2014-05-04 ENCOUNTER — Ambulatory Visit (HOSPITAL_COMMUNITY)
Admission: RE | Admit: 2014-05-04 | Discharge: 2014-05-04 | Disposition: A | Discharge status: Home / Self Care | Payer: Medicare Other | Source: Ambulatory Visit | Attending: Vascular Surgery | Admitting: Vascular Surgery

## 2014-05-04 DIAGNOSIS — Y929 Unspecified place or not applicable: Secondary | ICD-10-CM | POA: Insufficient documentation

## 2014-05-04 DIAGNOSIS — Z85828 Personal history of other malignant neoplasm of skin: Secondary | ICD-10-CM | POA: Diagnosis not present

## 2014-05-04 DIAGNOSIS — G8929 Other chronic pain: Secondary | ICD-10-CM | POA: Insufficient documentation

## 2014-05-04 DIAGNOSIS — I4891 Unspecified atrial fibrillation: Secondary | ICD-10-CM | POA: Insufficient documentation

## 2014-05-04 DIAGNOSIS — E785 Hyperlipidemia, unspecified: Secondary | ICD-10-CM | POA: Insufficient documentation

## 2014-05-04 DIAGNOSIS — I739 Peripheral vascular disease, unspecified: Secondary | ICD-10-CM | POA: Diagnosis not present

## 2014-05-04 DIAGNOSIS — I252 Old myocardial infarction: Secondary | ICD-10-CM | POA: Diagnosis not present

## 2014-05-04 DIAGNOSIS — Z48812 Encounter for surgical aftercare following surgery on the circulatory system: Secondary | ICD-10-CM

## 2014-05-04 DIAGNOSIS — I7025 Atherosclerosis of native arteries of other extremities with ulceration: Secondary | ICD-10-CM | POA: Diagnosis present

## 2014-05-04 DIAGNOSIS — Z7902 Long term (current) use of antithrombotics/antiplatelets: Secondary | ICD-10-CM | POA: Insufficient documentation

## 2014-05-04 DIAGNOSIS — I10 Essential (primary) hypertension: Secondary | ICD-10-CM | POA: Insufficient documentation

## 2014-05-04 DIAGNOSIS — Z792 Long term (current) use of antibiotics: Secondary | ICD-10-CM | POA: Insufficient documentation

## 2014-05-04 DIAGNOSIS — I70209 Unspecified atherosclerosis of native arteries of extremities, unspecified extremity: Secondary | ICD-10-CM

## 2014-05-04 DIAGNOSIS — L98499 Non-pressure chronic ulcer of skin of other sites with unspecified severity: Principal | ICD-10-CM

## 2014-05-04 DIAGNOSIS — Z87891 Personal history of nicotine dependence: Secondary | ICD-10-CM | POA: Diagnosis not present

## 2014-05-04 DIAGNOSIS — J449 Chronic obstructive pulmonary disease, unspecified: Secondary | ICD-10-CM | POA: Insufficient documentation

## 2014-05-04 DIAGNOSIS — Z79899 Other long term (current) drug therapy: Secondary | ICD-10-CM | POA: Insufficient documentation

## 2014-05-04 DIAGNOSIS — Z7901 Long term (current) use of anticoagulants: Secondary | ICD-10-CM | POA: Insufficient documentation

## 2014-05-04 DIAGNOSIS — T8189XA Other complications of procedures, not elsewhere classified, initial encounter: Secondary | ICD-10-CM | POA: Insufficient documentation

## 2014-05-04 DIAGNOSIS — D649 Anemia, unspecified: Secondary | ICD-10-CM | POA: Diagnosis not present

## 2014-05-04 DIAGNOSIS — Y835 Amputation of limb(s) as the cause of abnormal reaction of the patient, or of later complication, without mention of misadventure at the time of the procedure: Secondary | ICD-10-CM | POA: Diagnosis not present

## 2014-05-04 DIAGNOSIS — Z79891 Long term (current) use of opiate analgesic: Secondary | ICD-10-CM | POA: Insufficient documentation

## 2014-05-04 DIAGNOSIS — I70234 Atherosclerosis of native arteries of right leg with ulceration of heel and midfoot: Secondary | ICD-10-CM | POA: Diagnosis not present

## 2014-05-04 HISTORY — PX: ABDOMINAL AORTAGRAM: SHX5454

## 2014-05-04 HISTORY — PX: LOWER EXTREMITY ANGIOGRAM: SHX5508

## 2014-05-04 LAB — PROTIME-INR
INR: 1.58 — AB (ref 0.00–1.49)
Prothrombin Time: 19 seconds — ABNORMAL HIGH (ref 11.6–15.2)

## 2014-05-04 LAB — POCT I-STAT, CHEM 8
BUN: 26 mg/dL — AB (ref 6–23)
Calcium, Ion: 1.23 mmol/L (ref 1.13–1.30)
Chloride: 101 mmol/L (ref 96–112)
Creatinine, Ser: 1.1 mg/dL (ref 0.50–1.35)
Glucose, Bld: 107 mg/dL — ABNORMAL HIGH (ref 70–99)
HEMATOCRIT: 39 % (ref 39.0–52.0)
HEMOGLOBIN: 13.3 g/dL (ref 13.0–17.0)
Potassium: 4.6 mmol/L (ref 3.5–5.1)
SODIUM: 139 mmol/L (ref 135–145)
TCO2: 26 mmol/L (ref 0–100)

## 2014-05-04 SURGERY — ANGIOGRAM, LOWER EXTREMITY
Anesthesia: LOCAL

## 2014-05-04 MED ORDER — SODIUM CHLORIDE 0.9 % IV SOLN
INTRAVENOUS | Status: DC
  Administered 2014-05-04: 08:00:00 via INTRAVENOUS

## 2014-05-04 MED ORDER — MIDAZOLAM HCL 2 MG/2ML IJ SOLN
INTRAMUSCULAR | Status: AC
  Filled 2014-05-04: qty 2

## 2014-05-04 MED ORDER — SODIUM CHLORIDE 0.9 % IV SOLN: 1.0000 mL/kg/h | INTRAVENOUS | Status: DC

## 2014-05-04 MED ORDER — FENTANYL CITRATE 0.05 MG/ML IJ SOLN
INTRAMUSCULAR | Status: AC
  Filled 2014-05-04: qty 2

## 2014-05-04 NOTE — Discharge Instructions (Signed)

## 2014-05-04 NOTE — Telephone Encounter (Signed)
-----   Message from Denman George, RN sent at 05/04/2014  1:45 PM EDT ----- Regarding: [name] log; also needs 22mo. vasc. studies and office appt. w/ [facility] Needs 3 mo f/u for ABI's, duplex of (L) Ax-Fem BP (left lower extremity limited), and office appt. with [facility]  ----- Message -----    From: Angelia Mould, MD    Sent: 05/04/2014   9:17 AM      To: Vvs Charge Pool Subject: charge and f/u                                 PROCEDURE:  1. Ultrasound-guided access to the femorofemoral bypass graft 2. Right lower extremity arteriogram  SURGEON: Judeth Cornfield. Scot Dock, MD, FACS  This patient had no significant infrainguinal arterial occlusive disease and therefore Dr. Sharol Given can continue to follow his wounds on the right foot. I'll need to see him back in 3 months for ABIs and a duplex of his left axillobifemoral bypass graft. Thank you. CD

## 2014-05-04 NOTE — Op Note (Signed)
   PATIENT: Terry Martinez   MRN: [id number] DOB: 04-11-1940    DATE OF PROCEDURE: 05/04/2014  INDICATIONS: Levonte Molina is a 74 y.o. male who has had nonhealing wounds of the right foot. He has undergone a previous left axillobifemoral bypass graft in [facility]. He has a nonhealing wound of his great toe amputation site and also a wound on the lateral aspect of his right fifth toe. This reason he presents for arteriography to further evaluate him for infrainguinal arterial occlusive disease.  PROCEDURE:  1. Ultrasound-guided access to the femorofemoral bypass graft 2. Right lower extremity arteriogram  SURGEON: Judeth Cornfield. Scot Dock, MD, FACS  ANESTHESIA: local with sedation   EBL: minimal  TECHNIQUE: The patient was taken to the peripheral vascular laboratory 1 mg of Versed and 50 g of fentanyl. Under ultrasound guidance, after the skin was anesthetized, the femorofemoral graft was cannulated with a micropuncture needle and micropuncture sheath introduced over the wire. This was then exchanged for a 5 French sheath over a versa core wire. Right lower extremity arteriogram was obtained. At the completion of the procedure the patient was transferred to the holding area for removal.  FINDINGS:  1. The femorofemoral bypass graft is patent. It is anastomosed to the proximal right superficial femoral artery. The deep femoral arteries patent and the superficial femoral artery are patent. The popliteal, anterior tibial, tibial peroneal trunk, peroneal, and posterior tibial arteries are patent. No significant infrainguinal arterial occlusive disease is identified.  CLINICAL NOTE: The patient should have adequate circulation for healing any further workup that needs to be done on the right foot.  Deitra Mayo, MD, FACS [facility]ascular and Vein Specialists of [facility]  DATE OF DICTATION:   05/04/2014

## 2014-05-04 NOTE — Telephone Encounter (Signed)
Left msg for patient re appt, dpm

## 2014-05-04 NOTE — Progress Notes (Signed)
Site area: Superpubic femorofemoral graft a 5 french sheath was removed  Site Prior to Removal:  Level 0  Pressure Applied For 15 MINUTES    Minutes Beginning at 0930a  Manual:   Yes.    Patient Status During Pull:  stable  Post Pull Groin Site:  Level 0  Post Pull Instructions Given:  Yes.    Post Pull Pulses Present:  Yes.    Dressing Applied:  Yes.    Comments:  VS remain stable during sheath pull.  Pt denies any discomfort at this time.

## 2014-05-04 NOTE — H&P (View-Only) (Signed)
Vascular and Vein Specialist of [facility]  Patient name: Terry Martinez MRN: [id number] DOB: 1940-03-27 Sex: male  REASON FOR VISIT: Follow up of right foot wound.  HPI: Terry Martinez is a 74 y.o. male who underwent revascularization in [place]. He had a left axillobifemoral bypass graft approximately 4 years ago. 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 I performed a ray amputation of the right great toe on 02/19/2014. Initially this appeared to be healing but subsequently he developed an opening and this is now being packed. I last saw him on 03/25/2014. A duplex scan at that time showed that his left axillobifemoral bypass graft was patent. There were some elevated velocities at the proximal anastomosis. He had an ABI of 69% on the right and 86% on the left. He had a culture of this wound which grew pseudomonas which is sensitive to Cipro which she is now on. He states that since he has been on Cipro the wound seems to be improving.  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 His left axillobifemoral bypass graft is patent. There is a stent in the left subclavian artery proximal to the anastomosis which is patent. He did not appear to have any significant femoral popliteal artery occlusive disease bilaterally although was difficult to visualize his tibial vessels. He has also undergone a vein map which shows that he does appear to have a reasonable greater saphenous vein on the right if he did require a bypass.  When I saw him last on 04/08/2014, wound in the right foot appeared to be improving. He was on Cipro as the wound had grown Pseudomonas. He is also on Coumadin and this was being followed closely.  He has also been evaluated by Dr. Sharol Given and he is doing dressing changes to the wound with Silvadene. He debridement the lateral wound on his foot at his last visit. He states that he is continuing to have  some drainage from the wound over his fifth toe amputation site. He denies fever.  Past Medical History  Diagnosis Date  . Hyperlipidemia   . Hypertension   . COPD (chronic obstructive pulmonary disease)   . Peripheral artery disease   . Atrial fibrillation   . Chronic pain   . Pneumonia   . Kidney stones   . Headache     occasional  . Cancer     squamous cell carcinoma - left arm  . Anemia     hx low iron  . Myocardial infarction     3 stents  . Umbilical hernia   . Family history of adverse reaction to anesthesia     sister has difficulty waking up   Family History  Problem Relation Age of Onset  . Diabetes Mother   . Cancer Mother     Right Breast  . Heart disease Mother   . Hyperlipidemia Mother   . Hypertension Mother   . Lymphoma Mother     Chemo  . Cancer Father   . Cancer Brother   . Heart disease Brother   . Depression Brother   . Early death Brother   . Hyperlipidemia Brother   . Hypertension Brother   . Alcohol abuse Sister   . Heart disease Sister   . Hyperlipidemia Sister   . Hypertension Sister   . Stroke Sister   . Heart disease Maternal Grandmother   . Kidney disease Maternal Grandmother   .  Heart disease Maternal Grandfather   . Kidney disease Maternal Grandfather    SOCIAL HISTORY: History  Substance Use Topics  . Smoking status: Former Smoker    Types: Cigarettes    Quit date: 07/22/1998  . Smokeless tobacco: Current User    Types: Snuff  . Alcohol Use: No   Allergies  Allergen Reactions  . Zocor [Simvastatin] Hives   Current Outpatient Prescriptions  Medication Sig Dispense Refill  . acetaminophen (TYLENOL) 500 MG tablet Take 500 mg by mouth every 6 (six) hours as needed for pain.    [name] albuterol (PROVENTIL HFA;VENTOLIN HFA) 108 (90 BASE) MCG/ACT inhaler Inhale 2 puffs into the lungs every 6 (six) hours as needed for wheezing or shortness of breath. 1 Inhaler 2  . allopurinol (ZYLOPRIM) 100 MG tablet TAKE ONE TABLET BY MOUTH IN  THE EVENING 90 tablet 0  . allopurinol (ZYLOPRIM) 300 MG tablet Take 1 tablet (300 mg total) by mouth every morning. 90 tablet 3  . amoxicillin-clavulanate (AUGMENTIN) 875-125 MG per tablet Take 1 tablet by mouth 2 (two) times daily. 20 tablet 0  . cephALEXin (KEFLEX) 500 MG capsule Take 1 capsule (500 mg total) by mouth 3 (three) times daily. 21 capsule 0  . ciprofloxacin (CIPRO) 500 MG tablet Take 1 tablet (500 mg total) by mouth 2 (two) times daily. 20 tablet 0  . ciprofloxacin (CIPRO) 500 MG tablet Take 1 tablet (500 mg total) by mouth 2 (two) times daily. 20 tablet 0  . enalapril (VASOTEC) 10 MG tablet Take 1 tablet (10 mg total) by mouth daily. 90 tablet 3  . enoxaparin (LOVENOX) 150 MG/ML injection Inject 0.87 mLs (130 mg total) into the skin every 12 (twelve) hours. 14 mL 0  . EPINEPHrine (EPI-PEN) 0.3 mg/0.3 mL SOAJ injection Inject 0.3 mLs (0.3 mg total) into the muscle once. 1 Device 1  . ferrous fumarate (HEMOCYTE - 106 MG FE) 325 (106 FE) MG TABS tablet Take 1 tablet by mouth every evening.    . furosemide (LASIX) 40 MG tablet Take 1 tablet (40 mg total) by mouth daily. 30 tablet 3  . HYDROcodone-acetaminophen (NORCO) 5-325 MG per tablet Take 1 tablet by mouth every 6 (six) hours as needed for moderate pain. 20 tablet 0  . lovastatin (MEVACOR) 20 MG tablet Take 1 tablet (20 mg total) by mouth at bedtime. 90 tablet 2  . metoprolol tartrate (LOPRESSOR) 25 MG tablet Take 0.5 tablets (12.5 mg total) by mouth 2 (two) times daily. 60 tablet 6  . Multiple Vitamins-Minerals (MENS MULTIVITAMIN PLUS PO) Take 1 tablet by mouth daily.    . nitroGLYCERIN (NITROSTAT) 0.4 MG SL tablet Place 1 tablet (0.4 mg total) under the tongue every 5 (five) minutes as needed for chest pain. 90 tablet 3  . polyethylene glycol powder (GLYCOLAX/MIRALAX) powder Take 17 g by mouth 2 (two) times daily as needed. 527 g 3  . potassium chloride SA (K-DUR,KLOR-CON) 20 MEQ tablet Take 1 tablet (20 mEq total) by mouth daily.  90 tablet 3  . Silver Sulfadiazine (SILVADENE EX) Apply topically.    . tamsulosin (FLOMAX) 0.4 MG CAPS capsule TAKE ONE CAPSULE BY MOUTH EVERY DAY    . warfarin (COUMADIN) 5 MG tablet Take 1 tablet (5 mg total) by mouth daily. Restart 1 tablet 5 mg daily on 02/22/13 90 tablet 3  . zoster vaccine live, PF, (ZOSTAVAX) 31540 UNT/0.65ML injection Inject 19,400 Units into the skin once. 1 each 0   No current facility-administered medications for this visit.  REVIEW OF SYSTEMS: Valu.Nieves ] denotes positive finding; [  ] denotes negative finding  CARDIOVASCULAR:  [ ]  chest pain   [ ]  chest pressure   Valu.Nieves ] palpitations   Valu.Nieves ] orthopnea   Valu.Nieves ] dyspnea on exertion   Valu.Nieves ] claudication   [ ]  rest pain   [ ]  DVT   [ ]  phlebitis PULMONARY:   [ ]  productive cough   [ ]  asthma   [ ]  wheezing NEUROLOGIC:   [ ]  weakness  [ ]  paresthesias  [ ]  aphasia  [ ]  amaurosis  [ ]  dizziness HEMATOLOGIC:   [ ]  bleeding problems   [ ]  clotting disorders MUSCULOSKELETAL:  [ ]  joint pain   [ ]  joint swelling Valu.Nieves ] leg swelling GASTROINTESTINAL: [ ]   blood in stool  [ ]   hematemesis GENITOURINARY:  [ ]   dysuria  [ ]   hematuria PSYCHIATRIC:  [ ]  history of major depression INTEGUMENTARY:  [ ]  rashes  [ ]  ulcers CONSTITUTIONAL:  [ ]  fever   [ ]  chills  PHYSICAL EXAM: Filed Vitals:   04/29/14 1110  BP: 136/54  Pulse: 63  Temp: 97.5 F (36.4 C)  TempSrc: Oral  Height: 6' 3.5" (1.918 m)  Weight: 294 lb (133.358 kg)  SpO2: 97%   Body mass index is 36.25 kg/(m^2). GENERAL: The patient is a well-nourished male, in no acute distress. The vital signs are documented above. CARDIOVASCULAR: There is a regular rate and rhythm. Is difficult to palpate his femoral pulses because of his size. I cannot palpate pedal pulses. PULMONARY: There is good air exchange bilaterally without wheezing or rales. ABDOMEN: Soft and non-tender with normal pitched bowel sounds.  MUSCULOSKELETAL: There are no major deformities or cyanosis. NEUROLOGIC: No  focal weakness or paresthesias are detected. SKIN: the small wound at the site of his right great toe amputation is approximate 2 mm in diameter but approximately the depth of the Q-tip. The wound on the lateral aspect of foot has exposed fascia with the bone close by. PSYCHIATRIC: The patient has a normal affect.  DATA:  I reviewed his CT angiogram which shows that he does not appear to have any significant issues with his axilla bifemoral bypass graft except for some mildly elevated velocities at the proximal anastomosis. The femoral and popliteal vessels are patent on the right but the tibial vessels could not be visualized.  MEDICAL ISSUES: Given that the wound on the right foot is not progressing along the lateral aspect, I have recommended that we proceed with an arteriogram which will require cannulation of his axillofemoral graft. We have discussed the indications for the procedure and the potential complications including but not limited to bleeding, graft thrombosis, injury to the graft or infection. We'll be old Telfa and this if he is a candidate for a bypass or if there is any options from an endovascular standpoint. If he is a candidate for an endovascular approach this could potentially be done at the same time. He has undergone previous vein mapping which showed a reasonable right greater saphenous vein. We will stop his Coumadin prior to the procedure which is scheduled for 05/04/2014. In addition given that he is having continued drainage from the great toe site I have restarted him on Cipro. I'll make further recommendations. Results of his arteriogram.   Rhealyn Cullen S [facility] of [place]: [phone]

## 2014-05-04 NOTE — Interval H&P Note (Signed)
History and Physical Interval Note:  05/04/2014 8:34 AM  Terry Martinez  has presented today for surgery, with the diagnosis of pvd with right foot wound  The various methods of treatment have been discussed with the patient and family. After consideration of risks, benefits and other options for treatment, the patient has consented to  Procedure(s): LOWER EXTREMITY ANGIOGRAM (N/A) as a surgical intervention .  The patient's history has been reviewed, patient examined, no change in status, stable for surgery.  I have reviewed the patient's chart and labs.  Questions were answered to the patient's satisfaction.     Chelcy Bolda S

## 2014-05-05 ENCOUNTER — Telehealth: Payer: Self-pay | Admitting: Family Medicine

## 2014-05-05 DIAGNOSIS — I10 Essential (primary) hypertension: Secondary | ICD-10-CM | POA: Diagnosis not present

## 2014-05-05 DIAGNOSIS — Z4781 Encounter for orthopedic aftercare following surgical amputation: Secondary | ICD-10-CM | POA: Diagnosis not present

## 2014-05-05 DIAGNOSIS — J449 Chronic obstructive pulmonary disease, unspecified: Secondary | ICD-10-CM | POA: Diagnosis not present

## 2014-05-05 DIAGNOSIS — L89893 Pressure ulcer of other site, stage 3: Secondary | ICD-10-CM | POA: Diagnosis not present

## 2014-05-05 DIAGNOSIS — I70201 Unspecified atherosclerosis of native arteries of extremities, right leg: Secondary | ICD-10-CM | POA: Diagnosis not present

## 2014-05-05 DIAGNOSIS — G8929 Other chronic pain: Secondary | ICD-10-CM | POA: Diagnosis not present

## 2014-05-05 NOTE — Telephone Encounter (Signed)
Nurse with [facility] called asking when pt can restart warfarin.  Pt had angiogram yesterday and was told by MD not to take warfarin yesterday but was not told when to restart.  Advised nurse to call the MD who did procedure for warfarin orders for restarting.  Recheck INR Monday.

## 2014-05-06 ENCOUNTER — Ambulatory Visit: Payer: Medicare Other | Admitting: Vascular Surgery

## 2014-05-06 ENCOUNTER — Other Ambulatory Visit: Payer: Self-pay | Admitting: Family Medicine

## 2014-05-06 ENCOUNTER — Other Ambulatory Visit (HOSPITAL_COMMUNITY): Payer: Medicare Other

## 2014-05-06 ENCOUNTER — Encounter (HOSPITAL_COMMUNITY): Payer: Medicare Other

## 2014-05-06 DIAGNOSIS — I10 Essential (primary) hypertension: Secondary | ICD-10-CM | POA: Diagnosis not present

## 2014-05-06 DIAGNOSIS — L89893 Pressure ulcer of other site, stage 3: Secondary | ICD-10-CM | POA: Diagnosis not present

## 2014-05-06 DIAGNOSIS — Z4781 Encounter for orthopedic aftercare following surgical amputation: Secondary | ICD-10-CM | POA: Diagnosis not present

## 2014-05-06 DIAGNOSIS — J449 Chronic obstructive pulmonary disease, unspecified: Secondary | ICD-10-CM | POA: Diagnosis not present

## 2014-05-06 DIAGNOSIS — G8929 Other chronic pain: Secondary | ICD-10-CM | POA: Diagnosis not present

## 2014-05-06 DIAGNOSIS — I70201 Unspecified atherosclerosis of native arteries of extremities, right leg: Secondary | ICD-10-CM | POA: Diagnosis not present

## 2014-05-07 ENCOUNTER — Other Ambulatory Visit (HOSPITAL_COMMUNITY): Payer: Self-pay | Admitting: Orthopedic Surgery

## 2014-05-07 ENCOUNTER — Encounter (HOSPITAL_COMMUNITY): Payer: Self-pay | Admitting: *Deleted

## 2014-05-07 DIAGNOSIS — I70235 Atherosclerosis of native arteries of right leg with ulceration of other part of foot: Secondary | ICD-10-CM | POA: Diagnosis not present

## 2014-05-07 DIAGNOSIS — L97411 Non-pressure chronic ulcer of right heel and midfoot limited to breakdown of skin: Secondary | ICD-10-CM | POA: Diagnosis not present

## 2014-05-07 DIAGNOSIS — Z89411 Acquired absence of right great toe: Secondary | ICD-10-CM | POA: Diagnosis not present

## 2014-05-07 NOTE — Progress Notes (Addendum)
Mr Terry Martinez lives with his sister, Garald Braver, and her husband.  Mr [place] asked me to speak to Mrs Juliene Pina, "she knows everything and takes care of me."  I asked patient if he has experienced any chest pain and he denies any.  Mrs Juliene Pina reported that patient sees his cardiologist in am for regular scheduled appointment.  Cardiologist is Dr Harrington Challenger.  I instructed Mrs Juliene Pina that patient may have sprite with medications in am, but nothing after 0800.  I asked Mrs Juliene Pina to not time and amount

## 2014-05-08 ENCOUNTER — Encounter (HOSPITAL_COMMUNITY): Payer: Self-pay | Admitting: *Deleted

## 2014-05-08 ENCOUNTER — Ambulatory Visit (INDEPENDENT_AMBULATORY_CARE_PROVIDER_SITE_OTHER): Payer: Medicare Other | Admitting: Internal Medicine

## 2014-05-08 ENCOUNTER — Encounter: Payer: Self-pay | Admitting: Internal Medicine

## 2014-05-08 ENCOUNTER — Encounter (HOSPITAL_COMMUNITY)
Admission: RE | Disposition: A | Discharge status: Home / Self Care | Payer: Self-pay | Source: Ambulatory Visit | Attending: Orthopedic Surgery

## 2014-05-08 ENCOUNTER — Ambulatory Visit (HOSPITAL_COMMUNITY): Payer: Medicare Other | Admitting: Anesthesiology

## 2014-05-08 ENCOUNTER — Ambulatory Visit (HOSPITAL_COMMUNITY)
Admission: RE | Admit: 2014-05-08 | Discharge: 2014-05-08 | Disposition: A | Discharge status: Home / Self Care | Payer: Medicare Other | Source: Ambulatory Visit | Attending: Orthopedic Surgery | Admitting: Orthopedic Surgery

## 2014-05-08 VITALS — BP 132/58 | HR 56 | Ht 75.0 in | Wt 293.0 lb

## 2014-05-08 DIAGNOSIS — M869 Osteomyelitis, unspecified: Secondary | ICD-10-CM | POA: Diagnosis not present

## 2014-05-08 DIAGNOSIS — J449 Chronic obstructive pulmonary disease, unspecified: Secondary | ICD-10-CM | POA: Insufficient documentation

## 2014-05-08 DIAGNOSIS — M868X7 Other osteomyelitis, ankle and foot: Secondary | ICD-10-CM | POA: Diagnosis not present

## 2014-05-08 DIAGNOSIS — Z86718 Personal history of other venous thrombosis and embolism: Secondary | ICD-10-CM | POA: Insufficient documentation

## 2014-05-08 DIAGNOSIS — I4891 Unspecified atrial fibrillation: Secondary | ICD-10-CM | POA: Insufficient documentation

## 2014-05-08 DIAGNOSIS — D649 Anemia, unspecified: Secondary | ICD-10-CM | POA: Diagnosis not present

## 2014-05-08 DIAGNOSIS — I252 Old myocardial infarction: Secondary | ICD-10-CM | POA: Insufficient documentation

## 2014-05-08 DIAGNOSIS — Z888 Allergy status to other drugs, medicaments and biological substances status: Secondary | ICD-10-CM | POA: Insufficient documentation

## 2014-05-08 DIAGNOSIS — Z87891 Personal history of nicotine dependence: Secondary | ICD-10-CM | POA: Insufficient documentation

## 2014-05-08 DIAGNOSIS — I10 Essential (primary) hypertension: Secondary | ICD-10-CM

## 2014-05-08 DIAGNOSIS — I2510 Atherosclerotic heart disease of native coronary artery without angina pectoris: Secondary | ICD-10-CM | POA: Diagnosis not present

## 2014-05-08 DIAGNOSIS — L24A9 Irritant contact dermatitis due friction or contact with other specified body fluids: Secondary | ICD-10-CM

## 2014-05-08 DIAGNOSIS — Z955 Presence of coronary angioplasty implant and graft: Secondary | ICD-10-CM | POA: Diagnosis not present

## 2014-05-08 DIAGNOSIS — Z9841 Cataract extraction status, right eye: Secondary | ICD-10-CM | POA: Diagnosis not present

## 2014-05-08 DIAGNOSIS — E669 Obesity, unspecified: Secondary | ICD-10-CM | POA: Insufficient documentation

## 2014-05-08 DIAGNOSIS — L98499 Non-pressure chronic ulcer of skin of other sites with unspecified severity: Secondary | ICD-10-CM

## 2014-05-08 DIAGNOSIS — Z87442 Personal history of urinary calculi: Secondary | ICD-10-CM | POA: Insufficient documentation

## 2014-05-08 DIAGNOSIS — Z961 Presence of intraocular lens: Secondary | ICD-10-CM | POA: Diagnosis not present

## 2014-05-08 DIAGNOSIS — T148XXA Other injury of unspecified body region, initial encounter: Secondary | ICD-10-CM

## 2014-05-08 DIAGNOSIS — Z8701 Personal history of pneumonia (recurrent): Secondary | ICD-10-CM | POA: Insufficient documentation

## 2014-05-08 DIAGNOSIS — I739 Peripheral vascular disease, unspecified: Secondary | ICD-10-CM

## 2014-05-08 DIAGNOSIS — Z9842 Cataract extraction status, left eye: Secondary | ICD-10-CM | POA: Insufficient documentation

## 2014-05-08 DIAGNOSIS — I70235 Atherosclerosis of native arteries of right leg with ulceration of other part of foot: Secondary | ICD-10-CM | POA: Diagnosis not present

## 2014-05-08 DIAGNOSIS — Z85828 Personal history of other malignant neoplasm of skin: Secondary | ICD-10-CM | POA: Diagnosis not present

## 2014-05-08 DIAGNOSIS — Z6836 Body mass index (BMI) 36.0-36.9, adult: Secondary | ICD-10-CM | POA: Diagnosis not present

## 2014-05-08 DIAGNOSIS — E785 Hyperlipidemia, unspecified: Secondary | ICD-10-CM | POA: Insufficient documentation

## 2014-05-08 DIAGNOSIS — M86271 Subacute osteomyelitis, right ankle and foot: Secondary | ICD-10-CM | POA: Diagnosis not present

## 2014-05-08 DIAGNOSIS — L97411 Non-pressure chronic ulcer of right heel and midfoot limited to breakdown of skin: Secondary | ICD-10-CM | POA: Diagnosis not present

## 2014-05-08 HISTORY — DX: Acute embolism and thrombosis of unspecified deep veins of unspecified lower extremity: I82.409

## 2014-05-08 HISTORY — DX: Incisional hernia without obstruction or gangrene: K43.2

## 2014-05-08 HISTORY — DX: Constipation, unspecified: K59.00

## 2014-05-08 HISTORY — DX: Personal history of urinary calculi: Z87.442

## 2014-05-08 HISTORY — PX: AMPUTATION: SHX166

## 2014-05-08 LAB — COMPREHENSIVE METABOLIC PANEL
ALBUMIN: 3.7 g/dL (ref 3.5–5.2)
ALK PHOS: 88 U/L (ref 39–117)
ALT: 12 U/L (ref 0–53)
AST: 19 U/L (ref 0–37)
Anion gap: 10 (ref 5–15)
BILIRUBIN TOTAL: 0.6 mg/dL (ref 0.3–1.2)
BUN: 19 mg/dL (ref 6–23)
CO2: 26 mmol/L (ref 19–32)
Calcium: 9.3 mg/dL (ref 8.4–10.5)
Chloride: 102 mmol/L (ref 96–112)
Creatinine, Ser: 1.28 mg/dL (ref 0.50–1.35)
GFR calc Af Amer: 62 mL/min — ABNORMAL LOW (ref >90)
GFR, EST NON AFRICAN AMERICAN: 54 mL/min — AB (ref >90)
Glucose, Bld: 99 mg/dL (ref 70–99)
POTASSIUM: 4.3 mmol/L (ref 3.5–5.1)
Sodium: 138 mmol/L (ref 135–145)
Total Protein: 8.1 g/dL (ref 6.0–8.3)

## 2014-05-08 LAB — CBC
HEMATOCRIT: 40.6 % (ref 39.0–52.0)
Hemoglobin: 12.9 g/dL — ABNORMAL LOW (ref 13.0–17.0)
MCH: 28.9 pg (ref 26.0–34.0)
MCHC: 31.8 g/dL (ref 30.0–36.0)
MCV: 90.8 fL (ref 78.0–100.0)
Platelets: 173 10*3/uL (ref 150–400)
RBC: 4.47 MIL/uL (ref 4.22–5.81)
RDW: 16.2 % — ABNORMAL HIGH (ref 11.5–15.5)
WBC: 7.9 10*3/uL (ref 4.0–10.5)

## 2014-05-08 LAB — PROTIME-INR
INR: 1.31 (ref 0.00–1.49)
Prothrombin Time: 16.4 seconds — ABNORMAL HIGH (ref 11.6–15.2)

## 2014-05-08 LAB — APTT: APTT: 33 s (ref 24–37)

## 2014-05-08 SURGERY — AMPUTATION, FOOT, RAY
Anesthesia: General | Site: Foot | Laterality: Right

## 2014-05-08 MED ORDER — ATROPINE SULFATE 0.1 MG/ML IJ SOLN
INTRAMUSCULAR | Status: AC
  Filled 2014-05-08: qty 10

## 2014-05-08 MED ORDER — OXYCODONE HCL 5 MG/5ML PO SOLN: 5.0000 mg | Freq: Once | ORAL | Status: DC | PRN

## 2014-05-08 MED ORDER — LIDOCAINE HCL (CARDIAC) 20 MG/ML IV SOLN
INTRAVENOUS | Status: AC
  Filled 2014-05-08: qty 5

## 2014-05-08 MED ORDER — PHENYLEPHRINE 40 MCG/ML (10ML) SYRINGE FOR IV PUSH (FOR BLOOD PRESSURE SUPPORT)
PREFILLED_SYRINGE | INTRAVENOUS | Status: AC
  Filled 2014-05-08: qty 10

## 2014-05-08 MED ORDER — FENTANYL CITRATE 0.05 MG/ML IJ SOLN
INTRAMUSCULAR | Status: AC
  Filled 2014-05-08: qty 5

## 2014-05-08 MED ORDER — SODIUM CHLORIDE 0.9 % IJ SOLN
INTRAMUSCULAR | Status: AC
  Filled 2014-05-08: qty 20

## 2014-05-08 MED ORDER — ONDANSETRON HCL 4 MG/2ML IJ SOLN
INTRAMUSCULAR | Status: AC
  Filled 2014-05-08: qty 2

## 2014-05-08 MED ORDER — EPHEDRINE SULFATE 50 MG/ML IJ SOLN
INTRAMUSCULAR | Status: AC
  Filled 2014-05-08: qty 1

## 2014-05-08 MED ORDER — PROPOFOL 10 MG/ML IV BOLUS
INTRAVENOUS | Status: DC | PRN
  Administered 2014-05-08: 200 mg via INTRAVENOUS

## 2014-05-08 MED ORDER — MIDAZOLAM HCL 5 MG/5ML IJ SOLN
INTRAMUSCULAR | Status: DC | PRN
  Administered 2014-05-08: 2 mg via INTRAVENOUS

## 2014-05-08 MED ORDER — FUROSEMIDE 40 MG PO TABS: 40.0000 mg | ORAL_TABLET | Freq: Every day | ORAL | Status: DC

## 2014-05-08 MED ORDER — FENTANYL CITRATE 0.05 MG/ML IJ SOLN: 25.0000 ug | INTRAMUSCULAR | Status: DC | PRN

## 2014-05-08 MED ORDER — 0.9 % SODIUM CHLORIDE (POUR BTL) OPTIME
TOPICAL | Status: DC | PRN
  Administered 2014-05-08: 1000 mL

## 2014-05-08 MED ORDER — OXYCODONE HCL 5 MG PO TABS: 5.0000 mg | ORAL_TABLET | Freq: Once | ORAL | Status: DC | PRN

## 2014-05-08 MED ORDER — NEOSTIGMINE METHYLSULFATE 10 MG/10ML IV SOLN
INTRAVENOUS | Status: AC
  Filled 2014-05-08: qty 2

## 2014-05-08 MED ORDER — DEXTROSE 5 % IV SOLN
3.0000 g | INTRAVENOUS | Status: AC
  Administered 2014-05-08: 3 g via INTRAVENOUS
  Filled 2014-05-08 (×2): qty 3000

## 2014-05-08 MED ORDER — MIDAZOLAM HCL 2 MG/2ML IJ SOLN
INTRAMUSCULAR | Status: AC
  Filled 2014-05-08: qty 2

## 2014-05-08 MED ORDER — LACTATED RINGERS IV SOLN
INTRAVENOUS | Status: DC | PRN
  Administered 2014-05-08: 18:00:00 via INTRAVENOUS

## 2014-05-08 MED ORDER — PROPOFOL 10 MG/ML IV BOLUS
INTRAVENOUS | Status: AC
  Filled 2014-05-08: qty 20

## 2014-05-08 MED ORDER — LIDOCAINE HCL (CARDIAC) 20 MG/ML IV SOLN
INTRAVENOUS | Status: DC | PRN
  Administered 2014-05-08: 80 mg via INTRAVENOUS

## 2014-05-08 MED ORDER — ONDANSETRON HCL 4 MG/2ML IJ SOLN
INTRAMUSCULAR | Status: DC | PRN
  Administered 2014-05-08: 4 mg via INTRAVENOUS

## 2014-05-08 MED ORDER — ONDANSETRON HCL 4 MG/2ML IJ SOLN: 4.0000 mg | Freq: Four times a day (QID) | INTRAMUSCULAR | Status: DC | PRN

## 2014-05-08 SURGICAL SUPPLY — 34 items
BLADE SAW SGTL MED 73X18.5 STR (BLADE) ×3 IMPLANT
BNDG COHESIVE 4X5 TAN STRL (GAUZE/BANDAGES/DRESSINGS) ×3 IMPLANT
BNDG GAUZE ELAST 4 BULKY (GAUZE/BANDAGES/DRESSINGS) ×3 IMPLANT
COVER SURGICAL LIGHT HANDLE (MISCELLANEOUS) ×3 IMPLANT
DRAPE U-SHAPE 47X51 STRL (DRAPES) ×6 IMPLANT
DRSG ADAPTIC 3X8 NADH LF (GAUZE/BANDAGES/DRESSINGS) ×3 IMPLANT
DRSG PAD ABDOMINAL 8X10 ST (GAUZE/BANDAGES/DRESSINGS) ×6 IMPLANT
DURAPREP 26ML APPLICATOR (WOUND CARE) ×3 IMPLANT
ELECT REM PT RETURN 9FT ADLT (ELECTROSURGICAL) ×3
ELECTRODE REM PT RTRN 9FT ADLT (ELECTROSURGICAL) ×1 IMPLANT
GAUZE SPONGE 4X4 12PLY STRL (GAUZE/BANDAGES/DRESSINGS) ×3 IMPLANT
GLOVE BIOGEL PI IND STRL 6.5 (GLOVE) ×1 IMPLANT
GLOVE BIOGEL PI IND STRL 9 (GLOVE) ×1 IMPLANT
GLOVE BIOGEL PI INDICATOR 6.5 (GLOVE) ×2
GLOVE BIOGEL PI INDICATOR 9 (GLOVE) ×2
GLOVE SKINSENSE NS SZ6.5 (GLOVE) ×4
GLOVE SKINSENSE STRL SZ6.5 (GLOVE) ×2 IMPLANT
GLOVE SURG ORTHO 9.0 STRL STRW (GLOVE) ×3 IMPLANT
GOWN STRL REUS W/ TWL XL LVL3 (GOWN DISPOSABLE) ×2 IMPLANT
GOWN STRL REUS W/TWL XL LVL3 (GOWN DISPOSABLE) ×4
KIT BASIN OR (CUSTOM PROCEDURE TRAY) ×3 IMPLANT
KIT ROOM TURNOVER OR (KITS) ×3 IMPLANT
NS IRRIG 1000ML POUR BTL (IV SOLUTION) ×3 IMPLANT
PACK ORTHO EXTREMITY (CUSTOM PROCEDURE TRAY) ×3 IMPLANT
PAD ABD 8X10 STRL (GAUZE/BANDAGES/DRESSINGS) ×3 IMPLANT
PAD ARMBOARD 7.5X6 YLW CONV (MISCELLANEOUS) ×6 IMPLANT
SPONGE GAUZE 4X4 12PLY STER LF (GAUZE/BANDAGES/DRESSINGS) ×3 IMPLANT
SPONGE LAP 18X18 X RAY DECT (DISPOSABLE) ×6 IMPLANT
STOCKINETTE IMPERVIOUS LG (DRAPES) IMPLANT
SUT ETHILON 2 0 PSLX (SUTURE) ×6 IMPLANT
TOWEL OR 17X24 6PK STRL BLUE (TOWEL DISPOSABLE) ×3 IMPLANT
TOWEL OR 17X26 10 PK STRL BLUE (TOWEL DISPOSABLE) ×3 IMPLANT
UNDERPAD 30X30 INCONTINENT (UNDERPADS AND DIAPERS) ×3 IMPLANT
WATER STERILE IRR 1000ML POUR (IV SOLUTION) ×3 IMPLANT

## 2014-05-08 NOTE — Progress Notes (Signed)
Ancef 3gm requested at 1415-still not on unit at 1520,called 2nd floor Pharmacy to check on status of sending it.

## 2014-05-08 NOTE — Anesthesia Preprocedure Evaluation (Addendum)
Anesthesia Evaluation  Patient identified by MRN, date of birth, ID band Patient awake    Reviewed: Allergy & Precautions, NPO status , Patient's Chart, lab work & pertinent test results  Airway Mallampati: II  TM Distance: >3 FB Neck ROM: full    Dental   Pulmonary pneumonia -, COPDformer smoker,          Cardiovascular hypertension, + CAD, + Past MI and + Peripheral Vascular Disease Rhythm:Regular Rate:Normal     Neuro/Psych  Headaches,    GI/Hepatic negative GI ROS, Neg liver ROS,   Endo/Other  obese  Renal/GU negative Renal ROS     Musculoskeletal   Abdominal   Peds  Hematology   Anesthesia Other Findings   Reproductive/Obstetrics                            Anesthesia Physical Anesthesia Plan  ASA: III  Anesthesia Plan: General   Post-op Pain Management:    Induction: Intravenous  Airway Management Planned: LMA  Additional Equipment:   Intra-op Plan:   Post-operative Plan:   Informed Consent: I have reviewed the patients History and Physical, chart, labs and discussed the procedure including the risks, benefits and alternatives for the proposed anesthesia with the patient or authorized representative who has indicated his/her understanding and acceptance.   Dental advisory given  Plan Discussed with: CRNA, Anesthesiologist and Surgeon  Anesthesia Plan Comments:        Anesthesia Quick Evaluation

## 2014-05-08 NOTE — Patient Instructions (Addendum)
Your physician recommends that you schedule a follow-up appointment in: [place].   Your physician recommends that you continue on your current medications as directed. Please refer to the Current Medication list given to you today.

## 2014-05-08 NOTE — H&P (Signed)
Terry Martinez is an 74 y.o. male.   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  Past Medical History  Diagnosis Date  . Hyperlipidemia   . Hypertension   . COPD (chronic obstructive pulmonary disease)   . Peripheral artery disease   . Atrial fibrillation   . Chronic pain   . Pneumonia   . Headache     occasional  . Cancer     squamous cell carcinoma - left arm  . Anemia     hx low iron  . Myocardial infarction     3 stents  . Umbilical hernia   . Family history of adverse reaction to anesthesia     sister has difficulty waking up  . History of kidney stones   . Constipation   . DVT, lower extremity     many years  . Incisional hernia     x 2    Past Surgical History  Procedure Laterality Date  . Femoral-popliteal bypass graft    . Percutaneous coronary stent intervention (pci-s)    . Ankle surgery Left   . Eye surgery Bilateral     cataract surgery with lens implant  . Back surgery      2 titanium rods   . Tonsillectomy    . Colonoscopy    . Amputation Right 02/19/2014    Procedure: AMPUTATION RAY-RIGHT GREAT TOE;  Surgeon: Angelia Mould, MD;  Location: [facility];  Service: Vascular;  Laterality: Right;  . Lower extremity angiogram N/A 05/04/2014    Procedure: LOWER EXTREMITY ANGIOGRAM;  Surgeon: Angelia Mould, MD;  Location: [facility] CATH LAB;  Service: Cardiovascular;  Laterality: N/A;  . Abdominal aortagram  05/04/2014    Procedure: ABDOMINAL AORTAGRAM;  Surgeon: Angelia Mould, MD;  Location: [facility] CATH LAB;  Service: Cardiovascular;;  . Hernia repair      umbicial    Family History  Problem Relation Age of Onset  . Diabetes Mother   . Cancer Mother     Right Breast  .  Heart disease Mother   . Hyperlipidemia Mother   . Hypertension Mother   . Lymphoma Mother     Chemo  . Cancer Father   . Cancer Brother   . Heart disease Brother   . Depression Brother   . Early death Brother   . Hyperlipidemia Brother   . Hypertension Brother   . Alcohol abuse Sister   . Heart disease Sister   . Hyperlipidemia Sister   . Hypertension Sister   . Stroke Sister   . Heart disease Maternal Grandmother   . Kidney disease Maternal Grandmother   . Heart disease Maternal Grandfather   . Kidney disease Maternal Grandfather    Social History:  reports that he quit smoking about 15 years ago. His smoking use included Cigarettes. His smokeless tobacco use includes Snuff. He reports that he does not drink alcohol or use illicit drugs.  Allergies:  Allergies  Allergen Reactions  . Zocor [Simvastatin] Hives    No prescriptions prior to admission    No results found for this or any previous visit (from the past 19 hour(s)). No results found.  Review of Systems  All other systems reviewed and are negative.   There were no vitals taken for this  visit. 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  Plan: We'll plan for right foot first and fifth ray amputation. Risks and benefits were discussed including risk of the wound not healing. Patient states he understands wishes to proceed at this time.  DUDA,MARCUS V 05/08/2014, 6:29 AM

## 2014-05-08 NOTE — Anesthesia Postprocedure Evaluation (Signed)
  Anesthesia Post-op Note  Patient: Terry Martinez  Procedure(s) Performed: Procedure(s): 1st Ray and 5th Ray Amputation Right Foot (Right)  Patient Location: PACU  Anesthesia Type:General  Level of Consciousness: awake  Airway and Oxygen Therapy: Patient Spontanous Breathing  Post-op Pain: mild  Post-op Assessment: Post-op Vital signs reviewed  Post-op Vital Signs: Reviewed  Last Vitals:  Filed Vitals:   05/08/14 1924  BP: 142/52  Pulse: 70  Temp: 36.4 C  Resp: 14    Complications: No apparent anesthesia complications

## 2014-05-08 NOTE — Op Note (Signed)
05/08/2014  7:56 PM  PATIENT:  Terry Martinez    PRE-OPERATIVE DIAGNOSIS:  Osteomyelitis 5th MTP, and 1st Metatarsal Right Foot  POST-OPERATIVE DIAGNOSIS:  Same  PROCEDURE:  1st Ray and 5th Ray Amputation Right Foot  SURGEON:  Newt Minion, MD  PHYSICIAN ASSISTANT:None ANESTHESIA:   General  PREOPERATIVE INDICATIONS:  [name] is a  74 y.o. male with a diagnosis of Osteomyelitis 5th MTP, and 1st Metatarsal Right Foot who failed conservative measures and elected for surgical management.    The risks benefits and alternatives were discussed with the patient preoperatively including but not limited to the risks of infection, bleeding, nerve injury, cardiopulmonary complications, the need for revision surgery, among others, and the patient was willing to proceed.  OPERATIVE IMPLANTS: None  OPERATIVE FINDINGS: Infection involving the first metatarsal and fifth MTP joint  OPERATIVE PROCEDURE: Patient is a 74 year old gentleman who was brought to the operating room and underwent a general anesthetic. After adequate levels of anesthesia were obtained patient's right lower extremity was prepped using DuraPrep draped into a sterile field. A timeout was called. An incision was made ellipsing out the draining ulcer from the first metatarsal this was carried down to the base of the first metatarsal and this was resected in 1 block of tissue. All necrotic tissue was removed there were vascular clips and deep sutures which were also removed. The wound was irrigated with normal saline and the incision was closed using 2-0 nylon. Attention was then focused on the fifth metatarsal elliptical incision was made around the ulcer so the ulcer and fifth metatarsal and toe were resected in 1 block of tissue. The fifth metatarsal was resected through the base. The wound was irrigated with normal saline hemostasis was obtained and the incision was closed using 2-0 nylon. A sterile compressive dressing was  applied. Patient was extubated taken to the PACU in stable condition.

## 2014-05-08 NOTE — Progress Notes (Signed)
Cardiology Office Note   Date:  05/08/2014   ID:  Terry Martinez, DOB Jun 01, 1940, MRN [id number]  PCP:  Thersa Salt, DO  Cardiologist:   Dorris Carnes, MD   No chief complaint on file.     History of Present Illness: Terry Martinez is a 74 y.o. male with a history of CAD and PVOD. Followed by Dr Jodi Mourning at [facility]. Hx of afib Hx of CAD (6 stents, last in July 2014) He also has a history of extensive PVOD Followed by Dr Georgina Quint.  SInce I saw him he has been seen by Rosalia Hammers. I saw him for the first time in December  He returns for f/u  Since seen he denies CP  Breathing is OK He has been seen by Drs Doren Custard and Sharol Given  Had problems with wound and infection in R foot.  Due to have amputation of R toe    Current Outpatient Prescriptions  Medication Sig Dispense Refill  . acetaminophen (TYLENOL) 500 MG tablet Take 500 mg by mouth every 6 (six) hours as needed for pain.    [name] albuterol (PROVENTIL HFA;VENTOLIN HFA) 108 (90 BASE) MCG/ACT inhaler Inhale 2 puffs into the lungs every 6 (six) hours as needed for wheezing or shortness of breath. 1 Inhaler 2  . allopurinol (ZYLOPRIM) 100 MG tablet TAKE ONE TABLET BY MOUTH IN THE EVENING 90 tablet 0  . allopurinol (ZYLOPRIM) 300 MG tablet Take 1 tablet (300 mg total) by mouth every morning. 90 tablet 3  . ciprofloxacin (CIPRO) 500 MG tablet Take 1 tablet (500 mg total) by mouth 2 (two) times daily. 20 tablet 0  . enalapril (VASOTEC) 10 MG tablet Take 1 tablet (10 mg total) by mouth daily. 90 tablet 3  . EPINEPHrine (EPI-PEN) 0.3 mg/0.3 mL SOAJ injection Inject 0.3 mLs (0.3 mg total) into the muscle once. 1 Device 1  . ferrous fumarate (HEMOCYTE - 106 MG FE) 325 (106 FE) MG TABS tablet Take 1 tablet by mouth every evening.    . furosemide (LASIX) 40 MG tablet TAKE ONE TABLET BY MOUTH ONCE DAILY 30 tablet 0  . lovastatin (MEVACOR) 20 MG tablet Take 1 tablet (20 mg total) by mouth at bedtime. 90 tablet 2  . metoprolol tartrate (LOPRESSOR)  25 MG tablet Take 0.5 tablets (12.5 mg total) by mouth 2 (two) times daily. 60 tablet 6  . Multiple Vitamins-Minerals (MENS MULTIVITAMIN PLUS PO) Take 1 tablet by mouth daily.    . nitroGLYCERIN (NITROSTAT) 0.4 MG SL tablet Place 1 tablet (0.4 mg total) under the tongue every 5 (five) minutes as needed for chest pain. 90 tablet 3  . polyethylene glycol powder (GLYCOLAX/MIRALAX) powder Take 17 g by mouth 2 (two) times daily as needed. (Patient taking differently: Take 17 g by mouth 2 (two) times daily as needed for mild constipation. ) 527 g 3  . potassium chloride SA (K-DUR,KLOR-CON) 20 MEQ tablet Take 1 tablet (20 mEq total) by mouth daily. 90 tablet 3  . Silver Sulfadiazine (SILVADENE EX) Apply 1 application topically daily.     . tamsulosin (FLOMAX) 0.4 MG CAPS capsule TAKE ONE CAPSULE BY MOUTH EVERY DAY    . warfarin (COUMADIN) 5 MG tablet Take 1 tablet (5 mg total) by mouth daily. Restart 1 tablet 5 mg daily on 02/22/13 90 tablet 3  . zoster vaccine live, PF, (ZOSTAVAX) 63149 UNT/0.65ML injection Inject 19,400 Units into the skin once. 1 each 0   No current facility-administered medications for this visit.  Facility-Administered Medications Ordered in Other Visits  Medication Dose Route Frequency Provider Last Rate Last Dose  . ceFAZolin (ANCEF) 3 g in dextrose 5 % 50 mL IVPB  3 g Intravenous On Call to Prentiss, MD        Allergies:   Zocor   Past Medical History  Diagnosis Date  . Hyperlipidemia   . Hypertension   . COPD (chronic obstructive pulmonary disease)   . Peripheral artery disease   . Atrial fibrillation   . Chronic pain   . Pneumonia   . Headache     occasional  . Cancer     squamous cell carcinoma - left arm  . Anemia     hx low iron  . Myocardial infarction     3 stents  . Umbilical hernia   . Family history of adverse reaction to anesthesia     sister has difficulty waking up  . History of kidney stones   . Constipation   . DVT, lower extremity      many years  . Incisional hernia     x 2    Past Surgical History  Procedure Laterality Date  . Femoral-popliteal bypass graft    . Percutaneous coronary stent intervention (pci-s)    . Ankle surgery Left   . Eye surgery Bilateral     cataract surgery with lens implant  . Back surgery      2 titanium rods   . Tonsillectomy    . Colonoscopy    . Amputation Right 02/19/2014    Procedure: AMPUTATION RAY-RIGHT GREAT TOE;  Surgeon: Angelia Mould, MD;  Location: [place];  Service: Vascular;  Laterality: Right;  . Lower extremity angiogram N/A 05/04/2014    Procedure: LOWER EXTREMITY ANGIOGRAM;  Surgeon: Angelia Mould, MD;  Location: [facility] CATH LAB;  Service: Cardiovascular;  Laterality: N/A;  . Abdominal aortagram  05/04/2014    Procedure: ABDOMINAL AORTAGRAM;  Surgeon: Angelia Mould, MD;  Location: [facility] CATH LAB;  Service: Cardiovascular;;  . Hernia repair      umbicial     Social History:  The patient  reports that he quit smoking about 15 years ago. His smoking use included Cigarettes. His smokeless tobacco use includes Snuff. He reports that he does not drink alcohol or use illicit drugs.   Family History:  The patient's family history includes Alcohol abuse in his sister; Cancer in his brother, father, and mother; Depression in his brother; Diabetes in his mother; Early death in his brother; Heart disease in his brother, maternal grandfather, maternal grandmother, mother, and sister; Hyperlipidemia in his brother, mother, and sister; Hypertension in his brother, mother, and sister; Kidney disease in his maternal grandfather and maternal grandmother; Lymphoma in his mother; Stroke in his sister.    ROS:  Please see the history of present illness. All other systems are reviewed and  Negative to the above problem except as noted.    PHYSICAL EXAM: VS:  BP 132/58 mmHg  Pulse 56  Ht 6\' 3"  (1.905 m)  Wt 293 lb (132.904 kg)  BMI 36.62 kg/m2  SpO2 98%  GEn  Obese  in no  acute distress HEENT: normal Neck: no JVD, carotid bruits, or masses Cardiac: RRR; no murmurs, rubs, or gallops,No edema  Respiratory:  clear to auscultation bilaterally, normal work of breathing GI: soft, nontender, nondistended, + BS  No hepatomegaly  MS: no deformity Moving all extremities  R foot in boot/wrapped   Skin: warm  and dry, no rash Neuro:  Strength and sensation are intact Psych: euthymic mood, full affect   EKG:  EKG is ordered today.   Lipid Panel    Component Value Date/Time   CHOL 72 11/24/2013 0900   TRIG 65 11/24/2013 0900   HDL 31* 11/24/2013 0900   CHOLHDL 2.3 11/24/2013 0900   VLDL 13 11/24/2013 0900   LDLCALC 28 11/24/2013 0900      Wt Readings from Last 3 Encounters:  05/08/14 293 lb (132.904 kg)  05/04/14 294 lb (133.358 kg)  04/29/14 294 lb (133.358 kg)      ASSESSMENT AND PLAN:  1.  CAD  No symptoms of angina   Contineu medical Rx  2. PVOD  For surgery today    3  HL  Lipids in Oct excellent LDL 28  Continue    4  Wt loss  Congratualated him on wt loss Got is 220s      Current medicines are reviewed at length with the patient today.  The patient does not have concerns regarding medicines.  The following changes have been made: None  Labs/ tests ordered today include:  None   No orders of the defined types were placed in this encounter.     Disposition: Sept4ember follow up  Signed, Dorris Carnes, MD  05/08/2014 8:55 AM    [address], [place], [place]  [address] Phone: [phone]; Fax: [phone]

## 2014-05-08 NOTE — Transfer of Care (Signed)
Immediate Anesthesia Transfer of Care Note  Patient: Terry Martinez  Procedure(s) Performed: Procedure(s): 1st Ray and 5th Ray Amputation Right Foot (Right)  Patient Location: PACU  Anesthesia Type:General  Level of Consciousness: awake, alert  and oriented  Airway & Oxygen Therapy: Patient Spontanous Breathing  Post-op Assessment: Report given to RN and Post -op Vital signs reviewed and stable  Post vital signs: Reviewed and stable  Last Vitals:  Filed Vitals:   05/08/14 1417  BP: 151/78  Pulse: 58  Temp: 36.4 C  Resp: 18    Complications: No apparent anesthesia complications

## 2014-05-08 NOTE — Anesthesia Procedure Notes (Signed)
Procedure Name: LMA Insertion Date/Time: 05/08/2014 6:38 PM Performed by: Manuela Schwartz B Pre-anesthesia Checklist: Patient identified, Emergency Drugs available, Suction available, Patient being monitored and Timeout performed Patient Re-evaluated:Patient Re-evaluated prior to inductionOxygen Delivery Method: Circle system utilized Preoxygenation: Pre-oxygenation with 100% oxygen Intubation Type: IV induction LMA: LMA inserted LMA Size: 5.0 Number of attempts: 1 Placement Confirmation: positive ETCO2 and CO2 detector Tube secured with: Tape Dental Injury: Teeth and Oropharynx as per pre-operative assessment

## 2014-05-11 ENCOUNTER — Encounter (HOSPITAL_COMMUNITY): Payer: Self-pay | Admitting: Orthopedic Surgery

## 2014-05-11 ENCOUNTER — Telehealth: Payer: Self-pay | Admitting: Family Medicine

## 2014-05-11 DIAGNOSIS — G8929 Other chronic pain: Secondary | ICD-10-CM | POA: Diagnosis not present

## 2014-05-11 DIAGNOSIS — Z4781 Encounter for orthopedic aftercare following surgical amputation: Secondary | ICD-10-CM | POA: Diagnosis not present

## 2014-05-11 DIAGNOSIS — I70201 Unspecified atherosclerosis of native arteries of extremities, right leg: Secondary | ICD-10-CM | POA: Diagnosis not present

## 2014-05-11 DIAGNOSIS — J449 Chronic obstructive pulmonary disease, unspecified: Secondary | ICD-10-CM | POA: Diagnosis not present

## 2014-05-11 DIAGNOSIS — L89893 Pressure ulcer of other site, stage 3: Secondary | ICD-10-CM | POA: Diagnosis not present

## 2014-05-11 DIAGNOSIS — I10 Essential (primary) hypertension: Secondary | ICD-10-CM | POA: Diagnosis not present

## 2014-05-12 NOTE — Telephone Encounter (Signed)
Terry Martinez, nurse with [facility], called with INR report:   Pt is currently taking warfarin - 5 mg - daily x 1 week following angiogram.   Per the nurse, pt has been on 5 mg for 1 week, has completed Cipro, and another amputation last Fri but Warfarin was not stopped.   Protime seconds = 19.6   INR = 1.6  Advised nurse to have pt take warfarin 7.5 mg - Tues & Sat,  5 mg - other days of week and recheck 1 week at regular scheduled visit;    Nurse repeats correct dose back to me.   Dosed per Dr. Lacinda Axon

## 2014-05-18 ENCOUNTER — Telehealth: Payer: Self-pay | Admitting: Family Medicine

## 2014-05-18 DIAGNOSIS — L89893 Pressure ulcer of other site, stage 3: Secondary | ICD-10-CM | POA: Diagnosis not present

## 2014-05-18 DIAGNOSIS — I70201 Unspecified atherosclerosis of native arteries of extremities, right leg: Secondary | ICD-10-CM | POA: Diagnosis not present

## 2014-05-18 DIAGNOSIS — I10 Essential (primary) hypertension: Secondary | ICD-10-CM | POA: Diagnosis not present

## 2014-05-18 DIAGNOSIS — Z4781 Encounter for orthopedic aftercare following surgical amputation: Secondary | ICD-10-CM | POA: Diagnosis not present

## 2014-05-18 DIAGNOSIS — G8929 Other chronic pain: Secondary | ICD-10-CM | POA: Diagnosis not present

## 2014-05-18 DIAGNOSIS — J449 Chronic obstructive pulmonary disease, unspecified: Secondary | ICD-10-CM | POA: Diagnosis not present

## 2014-05-18 NOTE — Telephone Encounter (Signed)
Terry Martinez, nurse with [facility], called with INR report:   Pt is currently taking warfarin - 7.5 mg - Tues & Sat;  5 mg - Sun, Mon, Wed, Thurs, Fri;    Per the nurse, pt is not having any bleeding, bruising, no missed doses or extra doses, no Hospital or ED visits, no diet or medication changes, has finished antibiotics     Protime seconds = 34.9  INR = 2.9  Advised nurse to have pt continue to take warfarin on same schedule and recheck 1 week    Nurse repeats correct dose back to me.

## 2014-05-20 ENCOUNTER — Encounter: Payer: Self-pay | Admitting: Vascular Surgery

## 2014-05-25 ENCOUNTER — Telehealth: Payer: Self-pay | Admitting: *Deleted

## 2014-05-25 DIAGNOSIS — Z4781 Encounter for orthopedic aftercare following surgical amputation: Secondary | ICD-10-CM | POA: Diagnosis not present

## 2014-05-25 DIAGNOSIS — J449 Chronic obstructive pulmonary disease, unspecified: Secondary | ICD-10-CM | POA: Diagnosis not present

## 2014-05-25 DIAGNOSIS — L89893 Pressure ulcer of other site, stage 3: Secondary | ICD-10-CM | POA: Diagnosis not present

## 2014-05-25 DIAGNOSIS — G8929 Other chronic pain: Secondary | ICD-10-CM | POA: Diagnosis not present

## 2014-05-25 DIAGNOSIS — I10 Essential (primary) hypertension: Secondary | ICD-10-CM | POA: Diagnosis not present

## 2014-05-25 DIAGNOSIS — I70201 Unspecified atherosclerosis of native arteries of extremities, right leg: Secondary | ICD-10-CM | POA: Diagnosis not present

## 2014-05-25 NOTE — Telephone Encounter (Signed)
Received call from [facility] with INR results on Mr [facility]. INR 2.9 and 34.3 sec. No changes in medication or diet. No bruising or bleeding. No antibiotic use. Patient currently taking 7.5 mg Tues and Sat; 5 mg Sun, Mon, Wed, Thurs, Fri. Instructed home health nurse to keep him on same dose and check in 1 week. She repeated dosing instructions back. [place], Kevin Fenton

## 2014-05-28 DIAGNOSIS — I70235 Atherosclerosis of native arteries of right leg with ulceration of other part of foot: Secondary | ICD-10-CM | POA: Diagnosis not present

## 2014-05-28 DIAGNOSIS — Z89411 Acquired absence of right great toe: Secondary | ICD-10-CM | POA: Diagnosis not present

## 2014-05-28 DIAGNOSIS — B351 Tinea unguium: Secondary | ICD-10-CM | POA: Diagnosis not present

## 2014-05-28 DIAGNOSIS — L97411 Non-pressure chronic ulcer of right heel and midfoot limited to breakdown of skin: Secondary | ICD-10-CM | POA: Diagnosis not present

## 2014-06-01 ENCOUNTER — Telehealth: Payer: Self-pay | Admitting: Family Medicine

## 2014-06-01 DIAGNOSIS — I70201 Unspecified atherosclerosis of native arteries of extremities, right leg: Secondary | ICD-10-CM | POA: Diagnosis not present

## 2014-06-01 DIAGNOSIS — Z4781 Encounter for orthopedic aftercare following surgical amputation: Secondary | ICD-10-CM | POA: Diagnosis not present

## 2014-06-01 DIAGNOSIS — I10 Essential (primary) hypertension: Secondary | ICD-10-CM | POA: Diagnosis not present

## 2014-06-01 DIAGNOSIS — J449 Chronic obstructive pulmonary disease, unspecified: Secondary | ICD-10-CM | POA: Diagnosis not present

## 2014-06-01 DIAGNOSIS — L89893 Pressure ulcer of other site, stage 3: Secondary | ICD-10-CM | POA: Diagnosis not present

## 2014-06-01 DIAGNOSIS — G8929 Other chronic pain: Secondary | ICD-10-CM | POA: Diagnosis not present

## 2014-06-01 NOTE — Telephone Encounter (Signed)
Terry Martinez, nurse with [facility], called with INR report:   Pt is currently taking warfarin - 7.5 mg - Tues & Sat;   5 mg - Sun, Mon, Wed, Thurs, Fri;    Per the nurse, pt is not having any bleeding, bruising, no missed doses or extra doses, no Hospital or ED visits, no diet or medication changes,     Protime   INR = 3.4   Seconds = 40.5  Advised nurse to have pt take warfarin 7.5 mg - Sat,  5 mg - other days of week, recheck Wed 06-10-14 here at [facility], home health has completed  Treatment;      Nurse repeats correct dose back to me.

## 2014-06-08 ENCOUNTER — Other Ambulatory Visit: Payer: Self-pay | Admitting: Family Medicine

## 2014-06-08 NOTE — Telephone Encounter (Signed)
Covering for patient's PCP  Refilled lasix   Laroy Apple, MD [place] Resident, PGY-3 06/08/2014, 1:35 PM

## 2014-06-10 ENCOUNTER — Ambulatory Visit: Payer: Medicare Other

## 2014-06-11 ENCOUNTER — Encounter: Payer: Self-pay | Admitting: Family Medicine

## 2014-06-11 ENCOUNTER — Ambulatory Visit (INDEPENDENT_AMBULATORY_CARE_PROVIDER_SITE_OTHER): Payer: Medicare Other | Admitting: Family Medicine

## 2014-06-11 ENCOUNTER — Ambulatory Visit (INDEPENDENT_AMBULATORY_CARE_PROVIDER_SITE_OTHER): Payer: Medicare Other | Admitting: *Deleted

## 2014-06-11 VITALS — BP 119/62 | HR 59 | Temp 97.7°F | Ht 75.0 in | Wt 297.2 lb

## 2014-06-11 DIAGNOSIS — H1131 Conjunctival hemorrhage, right eye: Secondary | ICD-10-CM

## 2014-06-11 DIAGNOSIS — I739 Peripheral vascular disease, unspecified: Secondary | ICD-10-CM | POA: Diagnosis not present

## 2014-06-11 DIAGNOSIS — Z7901 Long term (current) use of anticoagulants: Secondary | ICD-10-CM

## 2014-06-11 DIAGNOSIS — I4891 Unspecified atrial fibrillation: Secondary | ICD-10-CM

## 2014-06-11 DIAGNOSIS — L98499 Non-pressure chronic ulcer of skin of other sites with unspecified severity: Secondary | ICD-10-CM

## 2014-06-11 DIAGNOSIS — S0501XA Injury of conjunctiva and corneal abrasion without foreign body, right eye, initial encounter: Secondary | ICD-10-CM | POA: Diagnosis not present

## 2014-06-11 LAB — POCT INR: INR: 3.3

## 2014-06-11 MED ORDER — CIPROFLOXACIN HCL 0.3 % OP SOLN: 1.0000 [drp] | OPHTHALMIC | Status: DC

## 2014-06-11 NOTE — Progress Notes (Signed)
   Subjective:    Patient ID: Terry Martinez, male    DOB: 07/09/1940, 74 y.o.   MRN: [id number]  HPI: Pt presents to clinic for SDA for a right eye problem. He was on his back porch playing with his dog and felt something fly up into his eye; he thinks it may have been a bug. Through the day yesterday, his eye did not bother him. This morning, when he woke up, it felt like something was still in his eye. His eye was "red in the white part," and it was uncomfortable. He has no pain with eye movement and he can see normally out of the eye. He has had no frank drainage or bleeding from the eye.  Review of Systems: As above. Denies fevers, chills, N/V, headaches.     Objective:   Physical Exam BP 119/62 mmHg  Pulse 59  Temp(Src) 97.7 F (36.5 C) (Oral)  Ht 6\' 3"  (1.905 m)  Wt 297 lb 3.2 oz (134.809 kg)  BMI 37.15 kg/m2 Gen: well-appearing elderly adult male in NAD HEENT: [place]/AT, EOMI, PERRLA, MMM  Right eye with apparent subconjunctival blood but no active bleeding, drainage, or discharge  Eye movements normal and not painful  No frank foreign body noted  Fluorescein exam with Wood's lamp without unusual uptake or corneal lesions Neck: supple, normal ROM, no lymphadenopathy Cardio: RRR, no murmur appreciated Pulm: CTAB, no wheezes, normal WOB Ext: warm, well-perfused, no LE edema     Assessment & Plan:  74yo male with right eye subconjunctival hemorrhage, possibly due to foreign body that is now cleared from the eye - no apparent corneal injury, otherwise - recommended flushing dye out of eye once he arrives home - Rx for Cipro eye drops, q4 hours while awake, for 7 days, to prevent any infection - urged close f/u here or in the ED if he develops any signs / symptoms of frank infection in or around the eye - f/u with PCP Dr. Lacinda Axon, otherwise  Note FYI to Dr. Lacinda Axon.  Emmaline Kluver, MD PGY-3, [facility] 06/11/2014, 12:56 PM

## 2014-06-11 NOTE — Patient Instructions (Signed)
Thank you for coming in, today!  I don't see anything in your eye, today. The dye exam I did looked fine -- there aren't any scratches or any other damage to your eye. Flush your eye with saline eye drops or tap water when you get home.  I will give you a prescription for antibiotic eye drops to use for a week. Use one drop in the right eye, every 4 hours while you're awake.  The redness is from bleeding just under the clear covering of your eye. This is called a "subconjunctival hemorrhage." Your body will clear it up, with time.  Call or come back if you have any significant eye pain, change in your vision, swelling around the eye, or bleeding / drainage from the eye. Otherwise, come back to see Dr. Lacinda Axon as you need. Please feel free to call with any questions or concerns at any time, at [phone]. --Dr. Venetia Maxon

## 2014-06-16 ENCOUNTER — Other Ambulatory Visit: Payer: Self-pay | Admitting: Family Medicine

## 2014-06-16 MED ORDER — POLYETHYLENE GLYCOL 3350 17 GM/SCOOP PO POWD: 17.0000 g | Freq: Two times a day (BID) | ORAL | Status: DC | PRN

## 2014-06-16 NOTE — Telephone Encounter (Signed)
Needs refill--miralax [place] in [place]

## 2014-06-25 ENCOUNTER — Ambulatory Visit (INDEPENDENT_AMBULATORY_CARE_PROVIDER_SITE_OTHER): Payer: Medicare Other | Admitting: *Deleted

## 2014-06-25 DIAGNOSIS — I4891 Unspecified atrial fibrillation: Secondary | ICD-10-CM | POA: Diagnosis present

## 2014-06-25 DIAGNOSIS — Z7901 Long term (current) use of anticoagulants: Secondary | ICD-10-CM

## 2014-06-25 LAB — POCT INR: INR: 3.7

## 2014-07-02 ENCOUNTER — Encounter: Payer: Self-pay | Admitting: Family Medicine

## 2014-07-02 ENCOUNTER — Ambulatory Visit (INDEPENDENT_AMBULATORY_CARE_PROVIDER_SITE_OTHER): Payer: Medicare Other | Admitting: *Deleted

## 2014-07-02 ENCOUNTER — Ambulatory Visit (INDEPENDENT_AMBULATORY_CARE_PROVIDER_SITE_OTHER): Payer: Medicare Other | Admitting: Family Medicine

## 2014-07-02 VITALS — BP 127/65 | HR 56 | Temp 97.6°F | Ht 76.0 in | Wt 293.6 lb

## 2014-07-02 DIAGNOSIS — I4891 Unspecified atrial fibrillation: Secondary | ICD-10-CM | POA: Diagnosis present

## 2014-07-02 DIAGNOSIS — Z89421 Acquired absence of other right toe(s): Secondary | ICD-10-CM

## 2014-07-02 DIAGNOSIS — I25119 Atherosclerotic heart disease of native coronary artery with unspecified angina pectoris: Secondary | ICD-10-CM | POA: Diagnosis not present

## 2014-07-02 DIAGNOSIS — I10 Essential (primary) hypertension: Secondary | ICD-10-CM | POA: Diagnosis not present

## 2014-07-02 DIAGNOSIS — E785 Hyperlipidemia, unspecified: Secondary | ICD-10-CM

## 2014-07-02 DIAGNOSIS — L98499 Non-pressure chronic ulcer of skin of other sites with unspecified severity: Secondary | ICD-10-CM

## 2014-07-02 DIAGNOSIS — Z7901 Long term (current) use of anticoagulants: Secondary | ICD-10-CM

## 2014-07-02 DIAGNOSIS — I739 Peripheral vascular disease, unspecified: Secondary | ICD-10-CM | POA: Diagnosis present

## 2014-07-02 LAB — POCT INR: INR: 2.2

## 2014-07-03 NOTE — Assessment & Plan Note (Signed)
Stable. Continue current therapy.

## 2014-07-03 NOTE — Progress Notes (Signed)
   Subjective:    Patient ID: Terry Martinez, male    DOB: 05-10-1940, 74 y.o.   MRN: [id number]  HPI 74 year old male with a complicated PMH including PAD s/p femoral-popliteal bypass & recent amputation of R 1st and 5th rays, CAD, HTN, HLD, A fib on Warfarin presents for follow up.  1) PAD  Doing well s/p amputation of R 1st and 5th rays.  He does have issues with mobility due to balance issues from the above amputation.   He is requesting aid regarding diabetic shoes. He recently purchased these and they were not covered by his insurance (resulting in ~ $400 out of pocket cost).  2) CAD   Stable. No recent chest pain, SOB.  3) HLD   Stable. No side effects from Lovastatin.  4) HTN  Home BP Monitoring - No.   Medications - Lasix, Enalapril, Metoprolol.   Compliance - Yes.  ROS: Denies chest pain, SOB, lightheadedness/dizziness   5) Afib  Rate controlled on Metoprolol and compliant with Warfarin.   Social Hx - Former smoker.  Review of Systems  Constitutional: Negative for fever and chills.  Respiratory: Negative for shortness of breath.   Cardiovascular: Negative for chest pain.  Skin:       Well healed surgical wounds of the right foot.       Objective:   Physical Exam  Filed Vitals:   07/02/14 0944  BP: 127/65  Pulse: 56  Temp: 97.6 F (36.4 C)   Vital signs reviewed.  Exam: General: well appearing obese male, NAD.  Cardiovascular: Irregular; No murmur.  Respiratory: CTAB. No rales, rhonchi, or wheeze. Abdomen: soft, nontender, nondistended. Extremities: Lower extremities with 1+ edema and chronic venous stasis changes. Right foot - two well healed scars from recent 1st and 5th ray amputation.     Assessment & Plan:  See Problem List

## 2014-07-03 NOTE — Assessment & Plan Note (Signed)
Doing well at this time. Will refer to PT for evaluation regarding shoe support.

## 2014-07-03 NOTE — Assessment & Plan Note (Signed)
Stable.  Given low cholesterol and LDL will not pursue high potency statin. Will continue Lovastatin.

## 2014-07-03 NOTE — Assessment & Plan Note (Signed)
Well controlled Continue current therapy 

## 2014-07-03 NOTE — Assessment & Plan Note (Signed)
Stable.  Continue Metoprolol and Warfarin.

## 2014-07-16 ENCOUNTER — Ambulatory Visit: Payer: Medicare Other

## 2014-07-17 ENCOUNTER — Other Ambulatory Visit: Payer: Self-pay | Admitting: Family Medicine

## 2014-07-22 ENCOUNTER — Ambulatory Visit (INDEPENDENT_AMBULATORY_CARE_PROVIDER_SITE_OTHER): Payer: Medicare Other | Admitting: *Deleted

## 2014-07-22 DIAGNOSIS — I4891 Unspecified atrial fibrillation: Secondary | ICD-10-CM | POA: Diagnosis present

## 2014-07-22 DIAGNOSIS — Z7901 Long term (current) use of anticoagulants: Secondary | ICD-10-CM

## 2014-07-22 LAB — POCT INR: INR: 2.4

## 2014-07-27 ENCOUNTER — Ambulatory Visit: Payer: Medicare Other | Attending: Family Medicine | Admitting: Physical Therapy

## 2014-07-27 DIAGNOSIS — M2141 Flat foot [pes planus] (acquired), right foot: Secondary | ICD-10-CM | POA: Diagnosis not present

## 2014-07-27 DIAGNOSIS — R29898 Other symptoms and signs involving the musculoskeletal system: Secondary | ICD-10-CM | POA: Diagnosis not present

## 2014-07-27 DIAGNOSIS — Z9181 History of falling: Secondary | ICD-10-CM | POA: Insufficient documentation

## 2014-07-27 DIAGNOSIS — R269 Unspecified abnormalities of gait and mobility: Secondary | ICD-10-CM | POA: Diagnosis not present

## 2014-07-27 DIAGNOSIS — M25673 Stiffness of unspecified ankle, not elsewhere classified: Secondary | ICD-10-CM

## 2014-07-27 NOTE — Patient Instructions (Signed)
   Kristoffer Leamon PT, DPT, LAT, ATC  [facility] Phone: [phone]     

## 2014-07-27 NOTE — Therapy (Addendum)
[place], [place], [address] Phone: [phone]   Fax:  [phone]  Physical Therapy Evaluation  Patient Details  Name: Terry Martinez MRN: [id number] Date of Birth: 11-01-1940 Referring Provider:  Coral Spikes, DO  Encounter Date: 07/27/2014      PT End of Session - 07/27/14 1451    Visit Number 1   Number of Visits 1   Date for PT Re-Evaluation 09/21/14   PT Start Time 5364   PT Stop Time 1445   PT Time Calculation (min) 60 min   Activity Tolerance Patient tolerated treatment well;Patient limited by fatigue   Behavior During Therapy [facility] for tasks assessed/performed      Past Medical History  Diagnosis Date  . Hyperlipidemia   . Hypertension   . COPD (chronic obstructive pulmonary disease)   . Peripheral artery disease   . Atrial fibrillation   . Chronic pain   . Pneumonia   . Headache     occasional  . Cancer     squamous cell carcinoma - left arm  . Anemia     hx low iron  . Myocardial infarction     3 stents  . Umbilical hernia   . Family history of adverse reaction to anesthesia     sister has difficulty waking up  . History of kidney stones   . Constipation   . DVT, lower extremity     many years  . Incisional hernia     x 2    Past Surgical History  Procedure Laterality Date  . Femoral-popliteal bypass graft    . Percutaneous coronary stent intervention (pci-s)    . Ankle surgery Left   . Eye surgery Bilateral     cataract surgery with lens implant  . Back surgery      2 titanium rods   . Tonsillectomy    . Colonoscopy    . Amputation Right 02/19/2014    Procedure: AMPUTATION RAY-RIGHT GREAT TOE;  Surgeon: Angelia Mould, MD;  Location: [place];  Service: Vascular;  Laterality: Right;  . Lower extremity angiogram N/A 05/04/2014    Procedure: LOWER EXTREMITY ANGIOGRAM;  Surgeon: Angelia Mould, MD;  Location: [facility] CATH LAB;  Service: Cardiovascular;   Laterality: N/A;  . Abdominal aortagram  05/04/2014    Procedure: ABDOMINAL AORTAGRAM;  Surgeon: Angelia Mould, MD;  Location: [facility] CATH LAB;  Service: Cardiovascular;;  . Hernia repair      umbicial  . Amputation Right 05/08/2014    Procedure: 1st Ray and 5th Ray Amputation Right Foot;  Surgeon: Newt Minion, MD;  Location: [place];  Service: Orthopedics;  Laterality: Right;    There were no vitals filed for this visit.  Visit Diagnosis:  Weakness of foot, right - Plan: PT plan of care cert/re-cert  Decreased ROM of ankle - Plan: PT plan of care cert/re-cert  Abnormality of gait - Plan: PT plan of care cert/re-cert  At risk for falling - Plan: PT plan of care cert/re-cert      Subjective Assessment - 07/27/14 1355    Subjective pt is a 74 y.o M with CC or R foot pain/weakness with decreased balanced due to s/p amputation of his 1st and 5th rays The 5th ray was amputed on 02/19/2014, the fifth and the other was . reports things getting better since the surgery.   Limitations Standing;Walking;House hold activities   How long can you sit comfortably? unlimited   How  long can you stand comfortably? 5 min   How long can you walk comfortably? 5 min   Diagnostic tests february per pt report things were looking   Patient Stated Goals to see what equipment needed   Currently in Pain? Yes   Pain Score --  only has pain when walking 7-8/10   Pain Location Foot   Pain Orientation Right   Pain Descriptors / Indicators Aching;Burning;Sore   Pain Onset More than a month ago   Pain Frequency Intermittent   Aggravating Factors  walking and standing   Pain Relieving Factors sitting and resting            [facility] PT Assessment - 07/27/14 1407    Assessment   Medical Diagnosis s/p 1st and 5th digit amputaiton   Onset Date/Surgical Date --  02/20/2015 for 1st digit, 05/08/2014   Next MD Visit --  PRN as needed.   Prior Therapy yes   Precautions   Precautions None   Precaution  Comments do not lift over 10-15 #   Restrictions   Weight Bearing Restrictions No   Balance Screen   Has the patient fallen in the past 6 months Yes   How many times? 1   Has the patient had a decrease in activity level because of a fear of falling?  Yes   Is the patient reluctant to leave their home because of a fear of falling?  Yes   Home Environment   Living Environment Private residence   Living Arrangements Other relatives  sister and brother in law   Available Help at Discharge Available PRN/intermittently;Available 24 hours/day   Type of Home House   Home Access Stairs to enter   Entrance Stairs-Number of Steps 2   Entrance Stairs-Rails Can reach both   Home Layout One level   March ARB - 2 wheels;Cane - single point   Prior Function   Level of Independence Independent;Independent with household mobility with device;Requires assistive device for independence;Independent with gait;Independent with community mobility with device   Vocation Retired   Leisure playing the [employer], playing with the dog,    Cognition   Overall Cognitive Status Within Functional Limits for tasks assessed   Observation/Other Assessments   Observations incisions/surgical area appear intact and healing well   Lower Extremity Functional Scale  16/80   ROM / Strength   AROM / PROM / Strength AROM;Strength   AROM   AROM Assessment Site Ankle   Right/Left Ankle Right;Left   Right Ankle Dorsiflexion 6   Right Ankle Plantar Flexion 30   Right Ankle Inversion 14   Right Ankle Eversion 12   Left Ankle Dorsiflexion 10   Left Ankle Plantar Flexion 36   Left Ankle Inversion 14   Left Ankle Eversion 12   Strength   Strength Assessment Site Ankle   Right/Left Ankle Right;Left   Right Ankle Dorsiflexion 3+/5   Right Ankle Plantar Flexion 4-/5   Right Ankle Inversion 3+/5   Right Ankle Eversion 3+/5   Left Ankle Dorsiflexion 4-/5   Left Ankle Plantar Flexion 4-/5   Left Ankle Inversion 4-/5    Left Ankle Eversion 4-/5   Palpation   Palpation comment no remarks of pain   Standardized Balance Assessment   10 Meter Walk 19 sec  .526 m/s   Berg Balance Test   Sit to Stand Needs minimal aid to stand or to stabilize  rw   Standing Unsupported Able to stand 30 seconds unsupported  Sitting with Back Unsupported but Feet Supported on Floor or Stool Able to sit 2 minutes under supervision   Stand to Sit Uses backs of legs against chair to control descent   Transfers Able to transfer with verbal cueing and /or supervision   Standing Unsupported with Eyes Closed Unable to keep eyes closed 3 seconds but stays steady   Standing Ubsupported with Feet Together Needs help to attain position and unable to hold for 15 seconds   From Standing, Reach Forward with Outstretched Arm Reaches forward but needs supervision   From Standing Position, Pick up Object from Floor Unable to pick up and needs supervision   From Standing Position, Turn to Look Behind Over each Shoulder Needs supervision when turning   Turn 360 Degrees Needs assistance while turning   Standing Unsupported, Alternately Place Feet on Step/Stool Needs assistance to keep from falling or unable to try   Standing Unsupported, One Foot in Front Loses balance while stepping or standing   Standing on One Leg Unable to try or needs assist to prevent fall   Total Score 14                   [facility] Adult PT Treatment/Exercise - 07/27/14 1507    Ankle Exercises: Standing   Other Standing Ankle Exercises heel and toe rocking 2 x 10  HHA to RW for safety   Ankle Exercises: Seated   Other Seated Ankle Exercises sit to stand x 8  with RW for support   Other Seated Ankle Exercises 4-way ankle strengthening.                 PT Education - 07/27/14 1451    Education provided Yes   Education Details evaluation findings, HEP, POC   Person(s) Educated Patient   Methods Explanation   Comprehension Verbalized  understanding        G-code: Mobility: walking and moving around Current Status: CM Goal Status CK            Plan - 07/27/14 1453    Clinical Impression Statement Anselm presents to OPPT with CC of intermittent R foot pain (after walking/standing only rated at 8/10) with weakness and instability.  The incision site looks intact, clean, and healing well. He ambulates with a RW with limited step length bil, with step to antalgic gait pattern. He demonstrates limited AROM and PROm secondary to muscle tightness. upon MMT he demonstrates weakness rated as a 3+/5 with DF/EV/In and 4-/5 with PF as well with MMT of the L ankle. He score 14/56 on the berg and  16/.80 on LEFS indicating a high risk of falls due to weakness and instability. He demonstrates decreased gait speed of  .526 m/s during 10 MWT  demonstrating intermittent incident where his R knee temporarily gives way but he is able to recover.  He would benefit from skilled physical therapy to address to maximize his function and to assist with balance and amb safety but pt wanting to wait on therapy and just do his HEP. per pt report the referring physician will write for a script for shoe support upone recieving physical therapy evaluation.    Pt will benefit from skilled therapeutic intervention in order to improve on the following deficits Abnormal gait;Decreased activity tolerance;Decreased balance;Decreased endurance;Decreased mobility;Pain;Decreased range of motion;Decreased strength;Decreased safety awareness;Difficulty walking;Increased edema;Impaired flexibility;Postural dysfunction;Improper body mechanics;Decreased knowledge of use of DME   PT Frequency 1x / week  pt reports only wanting to do one  time.   PT Treatment/Interventions ADLs/Self Care Home Management;Electrical Stimulation;Cryotherapy;Moist Heat;DME Instruction;Gait training;Neuromuscular re-education;Balance training;Therapeutic exercise;Therapeutic  activities;Functional mobility training;Manual techniques;Passive range of motion   PT Next Visit Plan He reported only wanting to do 1 visit   PT Home Exercise Plan see HEP handout   Consulted and Agree with Plan of Care Patient         Problem List Patient Active Problem List   Diagnosis Date Noted  . Hx of basal cell carcinoma 08/27/2013  . PAD (peripheral artery disease) 12/05/2012  . HTN (hypertension) 12/05/2012  . Atrial fibrillation 12/05/2012  . HLD (hyperlipidemia) 12/05/2012  . History of tobacco use 12/05/2012  . CAD (coronary artery disease) 12/05/2012  . Gout 12/05/2012  . Preventative health care 12/05/2012  . Long term current use of anticoagulant therapy 09/04/2012  . Abnormal prostate specific antigen 05/31/2011   Starr Lake PT, DPT, LAT, ATC  07/27/2014  3:27 PM   [facility] [address] [place], [place], [address] Phone: [phone]   Fax:  [phone]

## 2014-07-31 ENCOUNTER — Other Ambulatory Visit: Payer: Self-pay | Admitting: Family Medicine

## 2014-08-05 ENCOUNTER — Other Ambulatory Visit (HOSPITAL_COMMUNITY): Payer: Medicare Other

## 2014-08-05 ENCOUNTER — Ambulatory Visit (INDEPENDENT_AMBULATORY_CARE_PROVIDER_SITE_OTHER): Payer: Medicare Other | Admitting: *Deleted

## 2014-08-05 ENCOUNTER — Encounter (HOSPITAL_COMMUNITY): Payer: Medicare Other

## 2014-08-05 ENCOUNTER — Ambulatory Visit: Payer: Medicare Other | Admitting: Vascular Surgery

## 2014-08-05 DIAGNOSIS — I4891 Unspecified atrial fibrillation: Secondary | ICD-10-CM | POA: Diagnosis present

## 2014-08-05 DIAGNOSIS — Z7901 Long term (current) use of anticoagulants: Secondary | ICD-10-CM

## 2014-08-05 LAB — POCT INR: INR: 2.2

## 2014-08-08 ENCOUNTER — Other Ambulatory Visit: Payer: Self-pay | Admitting: Family Medicine

## 2014-08-29 ENCOUNTER — Other Ambulatory Visit: Payer: Self-pay | Admitting: Family Medicine

## 2014-09-02 ENCOUNTER — Ambulatory Visit: Payer: Medicare Other

## 2014-09-03 ENCOUNTER — Telehealth: Payer: Self-pay | Admitting: Internal Medicine

## 2014-09-03 DIAGNOSIS — I10 Essential (primary) hypertension: Secondary | ICD-10-CM

## 2014-09-03 DIAGNOSIS — E785 Hyperlipidemia, unspecified: Secondary | ICD-10-CM

## 2014-09-03 NOTE — Telephone Encounter (Signed)
Would recomm CBC, BMET, lipid panel Should be done closer to appt

## 2014-09-03 NOTE — Telephone Encounter (Signed)
Pts sister Terry Martinez calling to ask Dr Harrington Challenger if there were any test/labs that she would like the pt to have done prior to his OV appt with Dr Harrington Challenger on 11/06/14.  Informed the sister that there were no orders placed prior to this visit, and no indication on the pts last OV note that he needed to have any labs or other tests ordered and scheduled prior to his ov on 9/16.  Informed the sister of the scheduled appts the pt already has set for 7/28 to have an ABI W/WO TBI and LE ARTERIAL, and 8/10 follow-up appt with Dr Scot Dock.  Informed the sister that I can certainly send Dr Harrington Challenger a message to advise on any additional test that need to be ordered for the pt prior to his OV with her on 9/16, and someone from our office will follow-up with her thereafter with any new orders/recommendations.  Sister verbalized understanding and agrees with this plan.

## 2014-09-03 NOTE — Telephone Encounter (Signed)
New Message       Pt's sister calling stating that last time pt was seen by Dr. Harrington Challenger he was told that she wanted him to have some tests done prior to his next appt, there are no orders in Epic and pts sister wants to know what tests Dr. Harrington Challenger wanted the pt to have. Please call back and advise.

## 2014-09-04 NOTE — Telephone Encounter (Signed)
Orders placed for labs. Sent message to scheduler to request setting up appointment for patient.

## 2014-09-08 ENCOUNTER — Ambulatory Visit (INDEPENDENT_AMBULATORY_CARE_PROVIDER_SITE_OTHER): Payer: Medicare Other | Admitting: *Deleted

## 2014-09-08 DIAGNOSIS — I4891 Unspecified atrial fibrillation: Secondary | ICD-10-CM

## 2014-09-08 DIAGNOSIS — Z7901 Long term (current) use of anticoagulants: Secondary | ICD-10-CM

## 2014-09-08 LAB — POCT INR: INR: 2.2

## 2014-09-09 ENCOUNTER — Encounter (HOSPITAL_COMMUNITY): Payer: Medicare Other

## 2014-09-09 ENCOUNTER — Ambulatory Visit: Payer: Medicare Other | Admitting: Vascular Surgery

## 2014-09-14 ENCOUNTER — Ambulatory Visit: Payer: Medicare Other

## 2014-09-17 ENCOUNTER — Ambulatory Visit (HOSPITAL_COMMUNITY)
Admission: RE | Admit: 2014-09-17 | Discharge: 2014-09-17 | Disposition: A | Discharge status: Home / Self Care | Payer: Medicare Other | Source: Ambulatory Visit | Attending: Vascular Surgery | Admitting: Vascular Surgery

## 2014-09-17 ENCOUNTER — Ambulatory Visit (INDEPENDENT_AMBULATORY_CARE_PROVIDER_SITE_OTHER)
Admission: RE | Admit: 2014-09-17 | Discharge: 2014-09-17 | Disposition: A | Discharge status: Home / Self Care | Payer: Medicare Other | Source: Ambulatory Visit | Attending: Vascular Surgery | Admitting: Vascular Surgery

## 2014-09-17 ENCOUNTER — Other Ambulatory Visit: Payer: Self-pay | Admitting: Vascular Surgery

## 2014-09-17 DIAGNOSIS — Z48812 Encounter for surgical aftercare following surgery on the circulatory system: Secondary | ICD-10-CM

## 2014-09-17 DIAGNOSIS — L98499 Non-pressure chronic ulcer of skin of other sites with unspecified severity: Secondary | ICD-10-CM

## 2014-09-17 DIAGNOSIS — I70219 Atherosclerosis of native arteries of extremities with intermittent claudication, unspecified extremity: Secondary | ICD-10-CM

## 2014-09-17 DIAGNOSIS — I70209 Unspecified atherosclerosis of native arteries of extremities, unspecified extremity: Secondary | ICD-10-CM

## 2014-09-17 DIAGNOSIS — I739 Peripheral vascular disease, unspecified: Secondary | ICD-10-CM

## 2014-09-29 ENCOUNTER — Encounter: Payer: Self-pay | Admitting: Vascular Surgery

## 2014-09-30 ENCOUNTER — Encounter: Payer: Self-pay | Admitting: Vascular Surgery

## 2014-09-30 ENCOUNTER — Ambulatory Visit (INDEPENDENT_AMBULATORY_CARE_PROVIDER_SITE_OTHER): Payer: Medicare Other | Admitting: Vascular Surgery

## 2014-09-30 VITALS — BP 158/75 | HR 69 | Ht 76.0 in | Wt 298.0 lb

## 2014-09-30 DIAGNOSIS — I70209 Unspecified atherosclerosis of native arteries of extremities, unspecified extremity: Secondary | ICD-10-CM

## 2014-09-30 NOTE — Progress Notes (Signed)
[facility]  Patient name: Terry Martinez MRN: [id number] DOB: 12-17-1940 Sex: male  REASON FOR VISIT: Follow up of right foot wound.  HPI: Terry Martinez is a 74 y.o. male who comes in for a 3 month follow up visit. He underwent revascularization in [facility]. He had a left axillobifemoral bypass graft approximately 4 years ago. He had osteomyelitis of the right great toe and on 02/19/2014 I performed a ray amputation of the right great toe. Previous Doppler study showed an ABI of 69% on the right and 86% on the left with a patent axillobifemoral bypass graft. His wound from the toe grew pseudomonas which was sensitive to Cipro and he was treated with this. He did have a previous CT angiogram which showed that his axillobifemoral bypass graft was patent. There was a stent in the left subclavian artery proximal to his graft that appeared to be patent. He did not have any significant femoral artery or popliteal artery occlusive disease although the tibial vessels could not be well visualized.  Most recently, on 05/04/2014, he underwent ultrasound-guided access to his femorofemoral graft with right lower extremity runoff. The femorofemoral graft was patent. The right limb of the graft was anastomosed to the proximal right superficial femoral artery. The right deep femoral artery and superficial femoral artery were patent. The popliteal, anterior tibial, tibial peroneal trunk, peroneal, and posterior tibial arteries were patent. Thus there was no significant infrainguinal arterial occlusive disease. Based on his arteriogram I thought he should have adequate circulation for healing the wound on the right foot. He comes in for 3 month follow up visit. The wound has been followed by Dr.Duda, and the right foot wounds are now completely healed.  I do not get any history of claudication although his activity is fairly limited. I do not get any history of rest  pain.  Past Medical History  Diagnosis Date  . Hyperlipidemia   . Hypertension   . COPD (chronic obstructive pulmonary disease)   . Peripheral artery disease   . Atrial fibrillation   . Chronic pain   . Pneumonia   . Headache     occasional  . Cancer     squamous cell carcinoma - left arm  . Anemia     hx low iron  . Myocardial infarction     3 stents  . Umbilical hernia   . Family history of adverse reaction to anesthesia     sister has difficulty waking up  . History of kidney stones   . Constipation   . DVT, lower extremity     many years  . Incisional hernia     x 2   Family History  Problem Relation Age of Onset  . Diabetes Mother   . Cancer Mother     Right Breast  . Heart disease Mother   . Hyperlipidemia Mother   . Hypertension Mother   . Lymphoma Mother     Chemo  . Cancer Father   . Cancer Brother   . Heart disease Brother   . Depression Brother   . Early death Brother   . Hyperlipidemia Brother   . Hypertension Brother   . Alcohol abuse Sister   . Heart disease Sister   . Hyperlipidemia Sister   . Hypertension Sister   . Stroke Sister   . Heart disease Maternal Grandmother   . Kidney disease Maternal Grandmother   . Heart disease Maternal Grandfather   . Kidney  disease Maternal Grandfather    SOCIAL HISTORY: Social History  Substance Use Topics  . Smoking status: Former Smoker    Types: Cigarettes    Quit date: 07/22/1998  . Smokeless tobacco: Current User    Types: Snuff  . Alcohol Use: No   Allergies  Allergen Reactions  . Zocor [Simvastatin] Hives   Current Outpatient Prescriptions  Medication Sig Dispense Refill  . acetaminophen (TYLENOL) 500 MG tablet Take 500 mg by mouth every 6 (six) hours as needed for pain.    [name] albuterol (PROVENTIL HFA;VENTOLIN HFA) 108 (90 BASE) MCG/ACT inhaler Inhale 2 puffs into the lungs every 6 (six) hours as needed for wheezing or shortness of breath. 1 Inhaler 2  . allopurinol (ZYLOPRIM) 100 MG  tablet TAKE ONE TABLET BY MOUTH ONCE DAILY IN THE EVENING 90 tablet 2  . allopurinol (ZYLOPRIM) 300 MG tablet Take 1 tablet (300 mg total) by mouth every morning. 90 tablet 3  . enalapril (VASOTEC) 10 MG tablet Take 1 tablet (10 mg total) by mouth daily. 90 tablet 3  . EPINEPHrine (EPI-PEN) 0.3 mg/0.3 mL SOAJ injection Inject 0.3 mLs (0.3 mg total) into the muscle once. 1 Device 1  . ferrous fumarate (HEMOCYTE - 106 MG FE) 325 (106 FE) MG TABS tablet Take 1 tablet by mouth every evening.    . furosemide (LASIX) 40 MG tablet TAKE ONE TABLET BY MOUTH ONCE DAILY 30 tablet 0  . furosemide (LASIX) 40 MG tablet TAKE ONE TABLET BY MOUTH ONCE DAILY 30 tablet 2  . lovastatin (MEVACOR) 20 MG tablet Take 1 tablet (20 mg total) by mouth at bedtime. 90 tablet 2  . metoprolol tartrate (LOPRESSOR) 25 MG tablet Take 0.5 tablets (12.5 mg total) by mouth 2 (two) times daily. 60 tablet 6  . Multiple Vitamins-Minerals (MENS MULTIVITAMIN PLUS PO) Take 1 tablet by mouth daily.    . nitroGLYCERIN (NITROSTAT) 0.4 MG SL tablet Place 1 tablet (0.4 mg total) under the tongue every 5 (five) minutes as needed for chest pain. 90 tablet 3  . polyethylene glycol powder (GLYCOLAX/MIRALAX) powder Take 17 g by mouth 2 (two) times daily as needed for mild constipation. 527 g 3  . potassium chloride SA (K-DUR,KLOR-CON) 20 MEQ tablet Take 1 tablet (20 mEq total) by mouth daily. 90 tablet 3  . Silver Sulfadiazine (SILVADENE EX) Apply 1 application topically daily.     . tamsulosin (FLOMAX) 0.4 MG CAPS capsule TAKE ONE CAPSULE BY MOUTH EVERY DAY    . warfarin (COUMADIN) 5 MG tablet Take 1 tablet (5 mg total) by mouth daily. Restart 1 tablet 5 mg daily on 02/22/13 90 tablet 3  . zoster vaccine live, PF, (ZOSTAVAX) 73220 UNT/0.65ML injection Inject 19,400 Units into the skin once. (Patient not taking: Reported on 09/30/2014) 1 each 0   No current facility-administered medications for this visit.   REVIEW OF SYSTEMS: Valu.Nieves ] denotes positive  finding; [  ] denotes negative finding  CARDIOVASCULAR:  [ ]  chest pain   [ ]  chest pressure   [ ]  palpitations   [ ]  orthopnea   [ ]  dyspnea on exertion   [ ]  claudication   [ ]  rest pain   [ ]  DVT   [ ]  phlebitis PULMONARY:   [ ]  productive cough   [ ]  asthma   [ ]  wheezing NEUROLOGIC:   [ ]  weakness  [ ]  paresthesias  [ ]  aphasia  [ ]  amaurosis  [ ]  dizziness HEMATOLOGIC:   [ ]   bleeding problems   [ ]  clotting disorders MUSCULOSKELETAL:  [ ]  joint pain   [ ]  joint swelling [ ]  leg swelling GASTROINTESTINAL: [ ]   blood in stool  [ ]   hematemesis GENITOURINARY:  [ ]   dysuria  [ ]   hematuria PSYCHIATRIC:  [ ]  history of major depression INTEGUMENTARY:  [ ]  rashes  [ ]  ulcers CONSTITUTIONAL:  [ ]  fever   [ ]  chills  PHYSICAL EXAM: Filed Vitals:   09/30/14 1528  BP: 158/75  Pulse: 69  Height: 6\' 4"  (1.93 m)  Weight: 298 lb (135.172 kg)  SpO2: 97%   GENERAL: The patient is a well-nourished male, in no acute distress. The vital signs are documented above. CARDIAC: There is a regular rate and rhythm.  VASCULAR: I do not protect carotid bruits. I cannot palpate femoral pulses because of his size. I cannot palpate pedal pulses although both feet are warm and well-perfused. PULMONARY: There is good air exchange bilaterally without wheezing or rales. ABDOMEN: Soft and non-tender with normal pitched bowel sounds.  MUSCULOSKELETAL: he has a ray amputation of his right great toe and of his right fifth toe. NEUROLOGIC: No focal weakness or paresthesias are detected. SKIN: There are no ulcers or rashes noted. PSYCHIATRIC: The patient has a normal affect.  MEDICAL ISSUES: PERIPHERAL VASCULAR DISEASE: The patient is undergone a previous left axillobifemoral bypass graft in [facility]. He had a nonhealing wound of the right foot and I performed an arteriogram which showed that he had no significant infrainguinal arterial occlusive disease. His axillobifemoral bypass graft is patent.  Dr. Sharol Given has worked on his foot with an excellent result and his wounds are all now healed. I have ordered a follow up arterial Doppler study in 6 months and I will see him back at that time. He knows to call sooner if he has problems.   Return in about 6 months (around 04/02/2015).   Deitra Mayo [facility] [place]: [phone]

## 2014-09-30 NOTE — Addendum Note (Signed)
Addended by: Dorthula Rue L on: 09/30/2014 05:14 PM   Modules accepted: Orders

## 2014-10-05 ENCOUNTER — Encounter: Payer: Self-pay | Admitting: Family Medicine

## 2014-10-05 ENCOUNTER — Ambulatory Visit (INDEPENDENT_AMBULATORY_CARE_PROVIDER_SITE_OTHER): Payer: Medicare Other | Admitting: Family Medicine

## 2014-10-05 ENCOUNTER — Ambulatory Visit (INDEPENDENT_AMBULATORY_CARE_PROVIDER_SITE_OTHER): Payer: Medicare Other | Admitting: *Deleted

## 2014-10-05 VITALS — BP 131/49 | HR 64 | Temp 97.7°F | Ht 76.0 in | Wt 301.1 lb

## 2014-10-05 DIAGNOSIS — Z23 Encounter for immunization: Secondary | ICD-10-CM

## 2014-10-05 DIAGNOSIS — Z0000 Encounter for general adult medical examination without abnormal findings: Secondary | ICD-10-CM

## 2014-10-05 DIAGNOSIS — I70209 Unspecified atherosclerosis of native arteries of extremities, unspecified extremity: Secondary | ICD-10-CM

## 2014-10-05 DIAGNOSIS — R739 Hyperglycemia, unspecified: Secondary | ICD-10-CM

## 2014-10-05 DIAGNOSIS — I25119 Atherosclerotic heart disease of native coronary artery with unspecified angina pectoris: Secondary | ICD-10-CM

## 2014-10-05 DIAGNOSIS — I4891 Unspecified atrial fibrillation: Secondary | ICD-10-CM

## 2014-10-05 DIAGNOSIS — I10 Essential (primary) hypertension: Secondary | ICD-10-CM | POA: Diagnosis not present

## 2014-10-05 DIAGNOSIS — E669 Obesity, unspecified: Secondary | ICD-10-CM | POA: Diagnosis not present

## 2014-10-05 DIAGNOSIS — I739 Peripheral vascular disease, unspecified: Secondary | ICD-10-CM | POA: Diagnosis not present

## 2014-10-05 DIAGNOSIS — Z7901 Long term (current) use of anticoagulants: Secondary | ICD-10-CM | POA: Diagnosis not present

## 2014-10-05 LAB — POCT INR: INR: 3.4

## 2014-10-05 LAB — POCT GLYCOSYLATED HEMOGLOBIN (HGB A1C): Hemoglobin A1C: 5.7

## 2014-10-05 NOTE — Patient Instructions (Signed)
Nice to meet you today. We will give you a shot for pneumonia. Keep taking your current medications. You will come back in 2 weeks for another INR check.  Come back to see me in 3 months for chronic medical conditions. I will refer you to podiatry to help getting you some shoes to help with balance.  Take care,  Dr. Jacinto Reap

## 2014-10-05 NOTE — Assessment & Plan Note (Signed)
Well controlled. Continue current medication regimen.  

## 2014-10-05 NOTE — Assessment & Plan Note (Signed)
Stable. Rate controlled. Continue metoprolol Skip dose of warfarin tomorrow and continue 5 mg daily Recheck INR in 2 weeks See anticoagulation no for further information

## 2014-10-05 NOTE — Assessment & Plan Note (Signed)
Prevnar today Check A1c

## 2014-10-05 NOTE — Assessment & Plan Note (Signed)
Stable Followed by cardiology Continue current therapy

## 2014-10-05 NOTE — Progress Notes (Signed)
   Subjective:   Terry Martinez is a 74 y.o. 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  HTN/CAD - taking metoprolol 25mg , enalapril 10mg , lasix 40mg  - reprots good compliance with medications - denies CP, HAs, vision changes, symptoms of hypotension - stable SOB with exertion  HCM: - last colonoscopy 08/2013 - due for another in 2018 - flu shot in fall - Prevnar due today - last A1c >73yr ago  A fib - Rate controlled on Metoprolol - Compliant with Warfarin  PAD: - Reports poor ambulation 2/2 having toes removed on b/l feet 2/2 PAD - Dr. Lacinda Axon was working on getting shoes to help with balance, but worried that Medicare will not cover them - worried about falling - using cane, knows that he should use walker - going to start using it again - does not want to do further PT now    Review of Systems:  Per HPI. All other systems reviewed and are negative.   PMH, PSH, Medications, Allergies, and FmHx reviewed and updated in EMR.  Social History: former smoker  Objective:  BP 131/49 mmHg  Pulse 64  Temp(Src) 97.7 F (36.5 C) (Oral)  Ht 6\' 4"  (1.93 m)  Wt 301 lb 1.6 oz (136.578 kg)  BMI 36.67 kg/m2  Gen:  75 y.o. male in NAD, obese HEENT: NCAT, MMM, EOMI, anicteric sclerae CV: Irregularly irregular, normal rate, no MRG Resp: Non-labored, CTAB, no wheezes noted Abd: Soft, NTND, BS present, no guarding or organomegaly, +incisional hernia that is reducible Ext: 1+ edema and chronic venous stasis changes bilaterally. Right foot with well-healed scars from recent first and fifth ray amputation. Neuro: Alert and oriented, speech normal, walks slowly with cane      Chemistry      Component Value Date/Time   NA 138 05/08/2014 1415   K 4.3 05/08/2014 1415   CL 102 05/08/2014 1415   CO2 26 05/08/2014 1415   BUN 19 05/08/2014 1415   CREATININE 1.28 05/08/2014 1415   CREATININE 1.30 03/27/2014 0833      Component Value Date/Time   CALCIUM 9.3 05/08/2014 1415   ALKPHOS 88 05/08/2014 1415   AST 19 05/08/2014 1415   ALT 12 05/08/2014 1415   BILITOT 0.6 05/08/2014 1415      Lab Results  Component Value Date   WBC 7.9 05/08/2014   HGB 12.9* 05/08/2014   HCT 40.6 05/08/2014   MCV 90.8 05/08/2014   PLT 173 05/08/2014   Lab Results  Component Value Date   HGBA1C 5.7 12/06/2012   Assessment:     Terry Martinez is a 74 y.o. male here for f/u chronic issues    Plan:     See problem list for problem-specific plans.   [name], MD PGY-2,  [facility] 10/05/2014  2:15 PM

## 2014-10-05 NOTE — Assessment & Plan Note (Signed)
Doing well this time Followed by vascular surgery Refer to podiatry for evaluation for shoe support to help with stability

## 2014-10-05 NOTE — Assessment & Plan Note (Signed)
Screening A1c

## 2014-10-19 ENCOUNTER — Ambulatory Visit (INDEPENDENT_AMBULATORY_CARE_PROVIDER_SITE_OTHER): Payer: Medicare Other | Admitting: Podiatry

## 2014-10-19 ENCOUNTER — Ambulatory Visit (INDEPENDENT_AMBULATORY_CARE_PROVIDER_SITE_OTHER): Payer: Medicare Other | Admitting: *Deleted

## 2014-10-19 ENCOUNTER — Encounter: Payer: Self-pay | Admitting: Podiatry

## 2014-10-19 VITALS — BP 159/68 | HR 63 | Resp 16

## 2014-10-19 DIAGNOSIS — I70209 Unspecified atherosclerosis of native arteries of extremities, unspecified extremity: Secondary | ICD-10-CM

## 2014-10-19 DIAGNOSIS — Z7901 Long term (current) use of anticoagulants: Secondary | ICD-10-CM

## 2014-10-19 DIAGNOSIS — B351 Tinea unguium: Secondary | ICD-10-CM | POA: Diagnosis not present

## 2014-10-19 DIAGNOSIS — G629 Polyneuropathy, unspecified: Secondary | ICD-10-CM | POA: Diagnosis not present

## 2014-10-19 DIAGNOSIS — M79673 Pain in unspecified foot: Secondary | ICD-10-CM

## 2014-10-19 DIAGNOSIS — I739 Peripheral vascular disease, unspecified: Secondary | ICD-10-CM | POA: Diagnosis not present

## 2014-10-19 DIAGNOSIS — I4891 Unspecified atrial fibrillation: Secondary | ICD-10-CM

## 2014-10-19 LAB — POCT INR: INR: 3.5

## 2014-10-20 NOTE — Progress Notes (Signed)
Subjective:     Patient ID: Terry Martinez, male   DOB: [birth year], 74 y.o.   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

## 2014-10-30 ENCOUNTER — Ambulatory Visit: Payer: Medicare Other

## 2014-11-03 ENCOUNTER — Ambulatory Visit (INDEPENDENT_AMBULATORY_CARE_PROVIDER_SITE_OTHER): Payer: Medicare Other | Admitting: *Deleted

## 2014-11-03 ENCOUNTER — Other Ambulatory Visit: Payer: Medicare Other

## 2014-11-03 DIAGNOSIS — Z23 Encounter for immunization: Secondary | ICD-10-CM

## 2014-11-03 DIAGNOSIS — I4891 Unspecified atrial fibrillation: Secondary | ICD-10-CM

## 2014-11-03 DIAGNOSIS — Z7901 Long term (current) use of anticoagulants: Secondary | ICD-10-CM | POA: Diagnosis not present

## 2014-11-03 LAB — POCT INR: INR: 2.1

## 2014-11-04 NOTE — Progress Notes (Signed)
Cardiology Office Note   Date:  11/06/2014   ID:  Terry Martinez, DOB 05-16-1940, MRN [id number]  PCP:  Lavon Paganini, MD  Cardiologist:   Dorris Carnes, MD     Pt presents for f/u of CAD  History of Present Illness: Terry Martinez is a 74 y.o. male with a history of CAD and PVOD and afib    He has had6 stents, last in July 2014) He also has a history of extensive PVOD Followed by Dr Georgina Quint.  SInce I saw him he has been seen by Rosalia Hammers. I saw him for the first time in December I saw him in march 2016   Breathing good  No CP  No dizzinsss   Current Outpatient Prescriptions  Medication Sig Dispense Refill  . acetaminophen (TYLENOL) 500 MG tablet Take 500 mg by mouth every 6 (six) hours as needed for pain.    [name] albuterol (PROVENTIL HFA;VENTOLIN HFA) 108 (90 BASE) MCG/ACT inhaler Inhale 2 puffs into the lungs every 6 (six) hours as needed for wheezing or shortness of breath. 1 Inhaler 2  . allopurinol (ZYLOPRIM) 100 MG tablet TAKE ONE TABLET BY MOUTH ONCE DAILY IN THE EVENING 90 tablet 2  . allopurinol (ZYLOPRIM) 300 MG tablet Take 1 tablet (300 mg total) by mouth every morning. 90 tablet 3  . enalapril (VASOTEC) 10 MG tablet Take 1 tablet (10 mg total) by mouth daily. 90 tablet 3  . EPINEPHrine (EPI-PEN) 0.3 mg/0.3 mL SOAJ injection Inject 0.3 mLs (0.3 mg total) into the muscle once. 1 Device 1  . furosemide (LASIX) 40 MG tablet TAKE ONE TABLET BY MOUTH ONCE DAILY 30 tablet 2  . lovastatin (MEVACOR) 20 MG tablet Take 1 tablet (20 mg total) by mouth at bedtime. 90 tablet 2  . metoprolol tartrate (LOPRESSOR) 25 MG tablet Take 0.5 tablets (12.5 mg total) by mouth 2 (two) times daily. 60 tablet 6  . Multiple Vitamins-Minerals (MENS MULTIVITAMIN PLUS PO) Take 1 tablet by mouth daily.    . nitroGLYCERIN (NITROSTAT) 0.4 MG SL tablet Place 1 tablet (0.4 mg total) under the tongue every 5 (five) minutes as needed for chest pain. 90 tablet 3  . polyethylene glycol powder  (GLYCOLAX/MIRALAX) powder Take 17 g by mouth 2 (two) times daily as needed for mild constipation. 527 g 3  . potassium chloride SA (K-DUR,KLOR-CON) 20 MEQ tablet Take 1 tablet (20 mEq total) by mouth daily. 90 tablet 3  . tamsulosin (FLOMAX) 0.4 MG CAPS capsule TAKE ONE CAPSULE BY MOUTH EVERY DAY    . warfarin (COUMADIN) 5 MG tablet Take 1 tablet (5 mg total) by mouth daily. Restart 1 tablet 5 mg daily on 02/22/13 90 tablet 3   No current facility-administered medications for this visit.    Allergies:   Zocor   Past Medical History  Diagnosis Date  . Hyperlipidemia   . Hypertension   . COPD (chronic obstructive pulmonary disease)   . Peripheral artery disease   . Atrial fibrillation   . Chronic pain   . Pneumonia   . Headache     occasional  . Cancer     squamous cell carcinoma - left arm  . Anemia     hx low iron  . Myocardial infarction     3 stents  . Umbilical hernia   . Family history of adverse reaction to anesthesia     sister has difficulty waking up  . History of kidney stones   . Constipation   .  DVT, lower extremity     many years  . Incisional hernia     x 2    Past Surgical History  Procedure Laterality Date  . Femoral-popliteal bypass graft    . Percutaneous coronary stent intervention (pci-s)    . Ankle surgery Left   . Eye surgery Bilateral     cataract surgery with lens implant  . Back surgery      2 titanium rods   . Tonsillectomy    . Colonoscopy    . Amputation Right 02/19/2014    Procedure: AMPUTATION RAY-RIGHT GREAT TOE;  Surgeon: Angelia Mould, MD;  Location: [place];  Service: Vascular;  Laterality: Right;  . Lower extremity angiogram N/A 05/04/2014    Procedure: LOWER EXTREMITY ANGIOGRAM;  Surgeon: Angelia Mould, MD;  Location[facility] C[facility] LAB;  Service: Cardiovascular;  Laterality: N/A;  . Abdominal aortagram  05/04/2014    Procedure: ABDOMINAL AORTAGRAM;  Surgeon: Angelia Mould, MD;  Location[facility] CATH LAB;  Service:  Cardiovascular;;  . Hernia repair      umbicial  . Amputation Right 05/08/2014    Procedure: 1st Ray and 5th Ray Amputation Right Foot;  Surgeon: Newt Minion, MD;  Location: [place];  Service: Orthopedics;  Laterality: Right;     Social History:  The patient  reports that he quit smoking about 16 years ago. His smoking use included Cigarettes. His smokeless tobacco use includes Snuff. He reports that he does not drink alcohol or use illicit drugs.   Family History:  The patient's family history includes Alcohol abuse in his sister; Cancer in his brother, father, and mother; Depression in his brother; Diabetes in his mother; Early death in his brother; Heart disease in his brother, maternal grandfather, maternal grandmother, mother, and sister; Hyperlipidemia in his brother, mother, and sister; Hypertension in his brother, mother, and sister; Kidney disease in his maternal grandfather and maternal grandmother; Lymphoma in his mother; Stroke in his sister.    ROS:  Please see the history of present illness. All other systems are reviewed and  Negative to the above problem except as noted.    PHYSICAL EXAM: VS:  BP 138/58 mmHg  Pulse 46  Ht 6\' 3"  (1.905 m)  Wt 299 lb (135.626 kg)  BMI 37.37 kg/m2  SpO2 97%  GEN: Morbidly obese male  in no acute distress HEENT: normal Neck: no JVD, carotid bruits, or masses Cardiac: RRR; no murmurs, rubs, or gallops,1-2+  edema  Respiratory:  clear to auscultation bilaterally, normal work of breathing GI: soft, nontender, nondistended, + BS  No hepatomegaly  MS: no deformity Moving all extremities   Skin: warm and dry, no rash Neuro:  Strength and sensation are intact Psych: euthymic mood, full affect   EKG:  EKG is not  ordered today.   Lipid Panel    Component Value Date/Time   CHOL 72 11/24/2013 0900   TRIG 65 11/24/2013 0900   HDL 31* 11/24/2013 0900   CHOLHDL 2.3 11/24/2013 0900   VLDL 13 11/24/2013 0900   LDLCALC 28 11/24/2013 0900       Wt Readings from Last 3 Encounters:  11/06/14 299 lb (135.626 kg)  10/05/14 301 lb 1.6 oz (136.578 kg)  09/30/14 298 lb (135.172 kg)      ASSESSMENT AND PLAN:  1  CAD  No symptoms of angina  Keep on same regimen    2.  Atrial fib Contiue meds including coumadin    3  PVOD  Foot wound now healed  Sees C Dickson   4.HL  Continue on statin    Continue wt loss  F/u in April 2017     Signed, Dorris Carnes, MD  11/06/2014 3:11 PM    [facility] [address], [place], [place]  [address] Phone: [phone]; Fax: [phone]

## 2014-11-06 ENCOUNTER — Encounter: Payer: Self-pay | Admitting: Internal Medicine

## 2014-11-06 ENCOUNTER — Ambulatory Visit (INDEPENDENT_AMBULATORY_CARE_PROVIDER_SITE_OTHER): Payer: Medicare Other | Admitting: Internal Medicine

## 2014-11-06 VITALS — BP 138/58 | HR 46 | Ht 75.0 in | Wt 299.0 lb

## 2014-11-06 DIAGNOSIS — I70209 Unspecified atherosclerosis of native arteries of extremities, unspecified extremity: Secondary | ICD-10-CM | POA: Diagnosis not present

## 2014-11-06 DIAGNOSIS — I10 Essential (primary) hypertension: Secondary | ICD-10-CM | POA: Diagnosis not present

## 2014-11-06 DIAGNOSIS — E785 Hyperlipidemia, unspecified: Secondary | ICD-10-CM

## 2014-11-06 NOTE — Patient Instructions (Addendum)
Your physician recommends that you continue on your current medications as directed. Please refer to the Current Medication list given to you today. Your physician wants you to follow-up in: April 2017 with Dr. Ross.  You will receive a reminder letter in the mail two months in advance. If you don't receive a letter, please call our office to schedule the follow-up appointment.  

## 2014-11-10 ENCOUNTER — Other Ambulatory Visit: Payer: Self-pay | Admitting: Family Medicine

## 2014-11-17 ENCOUNTER — Ambulatory Visit (INDEPENDENT_AMBULATORY_CARE_PROVIDER_SITE_OTHER): Payer: Medicare Other | Admitting: *Deleted

## 2014-11-17 ENCOUNTER — Other Ambulatory Visit: Payer: Self-pay | Admitting: *Deleted

## 2014-11-17 DIAGNOSIS — Z7901 Long term (current) use of anticoagulants: Secondary | ICD-10-CM | POA: Diagnosis not present

## 2014-11-17 DIAGNOSIS — I4891 Unspecified atrial fibrillation: Secondary | ICD-10-CM | POA: Diagnosis present

## 2014-11-17 LAB — POCT INR: INR: 2.6

## 2014-11-18 MED ORDER — ALLOPURINOL 100 MG PO TABS: ORAL_TABLET | ORAL | Status: DC

## 2014-11-27 ENCOUNTER — Ambulatory Visit: Payer: Medicare Other | Admitting: *Deleted

## 2014-11-27 DIAGNOSIS — I739 Peripheral vascular disease, unspecified: Secondary | ICD-10-CM

## 2014-11-27 NOTE — Progress Notes (Signed)
Patient ID: Terry Martinez, male   DOB: 11-23-1940, 74 y.o.   MRN: [id number] Patient presents for measurement and molding of diabetic shoes

## 2014-12-15 ENCOUNTER — Ambulatory Visit (INDEPENDENT_AMBULATORY_CARE_PROVIDER_SITE_OTHER): Payer: Medicare Other | Admitting: *Deleted

## 2014-12-15 DIAGNOSIS — I4891 Unspecified atrial fibrillation: Secondary | ICD-10-CM | POA: Diagnosis present

## 2014-12-15 DIAGNOSIS — Z7901 Long term (current) use of anticoagulants: Secondary | ICD-10-CM | POA: Diagnosis not present

## 2014-12-15 LAB — POCT INR: INR: 3.2

## 2014-12-23 ENCOUNTER — Other Ambulatory Visit: Payer: Self-pay | Admitting: Family Medicine

## 2014-12-28 ENCOUNTER — Ambulatory Visit (INDEPENDENT_AMBULATORY_CARE_PROVIDER_SITE_OTHER): Payer: Medicare Other | Admitting: Podiatry

## 2014-12-28 DIAGNOSIS — M2041 Other hammer toe(s) (acquired), right foot: Secondary | ICD-10-CM | POA: Diagnosis not present

## 2014-12-28 DIAGNOSIS — L84 Corns and callosities: Secondary | ICD-10-CM

## 2014-12-28 DIAGNOSIS — S98111A Complete traumatic amputation of right great toe, initial encounter: Secondary | ICD-10-CM

## 2014-12-28 DIAGNOSIS — Z89411 Acquired absence of right great toe: Secondary | ICD-10-CM

## 2014-12-28 DIAGNOSIS — E1040 Type 1 diabetes mellitus with diabetic neuropathy, unspecified: Secondary | ICD-10-CM

## 2014-12-28 DIAGNOSIS — E1059 Type 1 diabetes mellitus with other circulatory complications: Secondary | ICD-10-CM

## 2014-12-28 DIAGNOSIS — M2042 Other hammer toe(s) (acquired), left foot: Secondary | ICD-10-CM

## 2014-12-28 NOTE — Patient Instructions (Signed)

## 2014-12-28 NOTE — Progress Notes (Signed)
Patient ID: Terry Martinez, male   DOB: 07-Jun-1940, 74 y.o.   MRN: [id number] Patient presents for diabetic shoe pick up, shoes are tried on for good fit.  Patient received 1 Pair Apex Y910M Ariya Moc toe Brown in men's size 15 wide and 3 pairs custom molded diabetic inserts with Right hallux toe filler.  Verbal and written break in and wear instructions given.  Patient will follow up for scheduled routine care.

## 2014-12-30 ENCOUNTER — Ambulatory Visit (INDEPENDENT_AMBULATORY_CARE_PROVIDER_SITE_OTHER): Payer: Medicare Other | Admitting: *Deleted

## 2014-12-30 ENCOUNTER — Other Ambulatory Visit: Payer: Self-pay | Admitting: Family Medicine

## 2014-12-30 DIAGNOSIS — Z7901 Long term (current) use of anticoagulants: Secondary | ICD-10-CM | POA: Diagnosis not present

## 2014-12-30 DIAGNOSIS — I4891 Unspecified atrial fibrillation: Secondary | ICD-10-CM | POA: Diagnosis present

## 2014-12-30 DIAGNOSIS — Z85828 Personal history of other malignant neoplasm of skin: Secondary | ICD-10-CM

## 2014-12-30 LAB — POCT INR: INR: 1.9

## 2014-12-31 ENCOUNTER — Ambulatory Visit: Payer: Medicare Other

## 2015-01-05 DIAGNOSIS — L570 Actinic keratosis: Secondary | ICD-10-CM | POA: Diagnosis not present

## 2015-01-05 DIAGNOSIS — L821 Other seborrheic keratosis: Secondary | ICD-10-CM | POA: Diagnosis not present

## 2015-01-05 DIAGNOSIS — L578 Other skin changes due to chronic exposure to nonionizing radiation: Secondary | ICD-10-CM | POA: Diagnosis not present

## 2015-01-12 ENCOUNTER — Ambulatory Visit (INDEPENDENT_AMBULATORY_CARE_PROVIDER_SITE_OTHER): Payer: Medicare Other | Admitting: *Deleted

## 2015-01-12 DIAGNOSIS — I4891 Unspecified atrial fibrillation: Secondary | ICD-10-CM

## 2015-01-12 LAB — POCT INR: INR: 2.8

## 2015-01-19 ENCOUNTER — Ambulatory Visit (INDEPENDENT_AMBULATORY_CARE_PROVIDER_SITE_OTHER): Payer: Medicare Other | Admitting: Student

## 2015-01-19 ENCOUNTER — Ambulatory Visit: Payer: Medicare Other | Admitting: Podiatry

## 2015-01-19 ENCOUNTER — Encounter: Payer: Self-pay | Admitting: Student

## 2015-01-19 VITALS — BP 134/56 | HR 55 | Temp 97.7°F | Ht 75.0 in | Wt 288.5 lb

## 2015-01-19 DIAGNOSIS — J069 Acute upper respiratory infection, unspecified: Secondary | ICD-10-CM | POA: Diagnosis present

## 2015-01-19 DIAGNOSIS — I70209 Unspecified atherosclerosis of native arteries of extremities, unspecified extremity: Secondary | ICD-10-CM

## 2015-01-19 NOTE — Assessment & Plan Note (Addendum)
Symptoms and clinical exam consistent with viral URI, unlikely serious bacterial infection given normal exam and pt has remained afebrile - will continue conservative management with OTC cold and flu relievers, cough drops - Encouraged to stay hydrated - return precautions given

## 2015-01-19 NOTE — Patient Instructions (Signed)
Follow up as needed for cold symptoms We discussed continuing cough drops, over the counter Alka Seltzer Cold and Flu medicine, AM and PM If you have questions or concerns, call the office at [phone]

## 2015-01-19 NOTE — Progress Notes (Signed)
Subjective:    Patient ID: Terry Martinez, male    DOB: [birth year], 74 y.o.   MRN: [id number]   CC: Concern for Cold  HPI 74 y/o M presenting for cough and congestion x4 days  Cough and congestion - Noted it the evening if Thanks giving - He has cough with increased congestin that is worse at night - A family member was recently sick with pneumonia and was at the Thanksgiving gathering, else no known sick contacts - Denies fever, SOB, throat pain, N/V/D  Review of Systems   See HPI for ROS.   Past Medical History  Diagnosis Date  . Hyperlipidemia   . Hypertension   . COPD (chronic obstructive pulmonary disease) ([place])   . Peripheral artery disease ([place])   . Atrial fibrillation ([place])   . Chronic pain   . Pneumonia   . Headache     occasional  . Cancer (HCC)     squamous cell carcinoma - left arm  . Anemia     hx low iron  . Myocardial infarction ([place])     3 stents  . Umbilical hernia   . Family history of adverse reaction to anesthesia     sister has difficulty waking up  . History of kidney stones   . Constipation   . DVT, lower extremity ([place])     many years  . Incisional hernia     x 2   Past Surgical History  Procedure Laterality Date  . Femoral-popliteal bypass graft    . Percutaneous coronary stent intervention (pci-s)    . Ankle surgery Left   . Eye surgery Bilateral     cataract surgery with lens implant  . Back surgery      2 titanium rods   . Tonsillectomy    . Colonoscopy    . Amputation Right 02/19/2014    Procedure: AMPUTATION RAY-RIGHT GREAT TOE;  Surgeon: Angelia Mould, MD;  Location: [place];  Service: Vascular;  Laterality: Right;  . Lower extremity angiogram N/A 05/04/2014    Procedure: LOWER EXTREMITY ANGIOGRAM;  Surgeon: Angelia Mould, MD;  Location: [facility] CATH LAB;  Service: Cardiovascular;  Laterality: N/A;  . Abdominal aortagram  05/04/2014    Procedure: ABDOMINAL AORTAGRAM;  Surgeon: Angelia Mould, MD;  Location: [facility]  CATH LAB;  Service: Cardiovascular;;  . Hernia repair      umbicial  . Amputation Right 05/08/2014    Procedure: 1st Ray and 5th Ray Amputation Right Foot;  Surgeon: Newt Minion, MD;  Location: [place];  Service: Orthopedics;  Laterality: Right;    Social History   Social History  . Marital Status: Single    Spouse Name: N/A  . Number of Children: N/A  . Years of Education: N/A   Occupational History  . Not on file.   Social History Main Topics  . Smoking status: Former Smoker    Types: Cigarettes    Quit date: 07/22/1998  . Smokeless tobacco: Current User    Types: Snuff  . Alcohol Use: No  . Drug Use: No  . Sexual Activity: No   Other Topics Concern  . Not on file   Social History Narrative    Objective:  BP 134/56 mmHg  Pulse 55  Temp(Src) 97.7 F (36.5 C) (Oral)  Ht 6\' 3"  (1.905 m)  Wt 288 lb 8 oz (130.863 kg)  BMI 36.06 kg/m2  SpO2 98% Vitals and nursing note reviewed  General: NAD Cardiac: RRR, no murmurs  noted HEENT- normal oropharynx, no lymphadenopathy Respiratory: CTAB, normal effort Abdomen: soft, nontender, large ventral hernia apprx 10 cm in greatest diameter, soft and reducible Skin: warm and dry, no rashes noted Neuro: alert and oriented, no focal deficits   Assessment & Plan:    URI (upper respiratory infection) Symptoms and clinical exam consistent with viral URI, unlikely serious bacterial infection given normal exam and pt has remained afebrile - will continue conservative management with OTC cold and flu relievers, cough drops - Encouraged to stay hydrated - return precautions given      Milanni Ayub A. Lincoln Brigham MD, [facility] Resident PGY-1 Pager [phone]

## 2015-01-20 ENCOUNTER — Ambulatory Visit: Payer: Medicare Other

## 2015-01-22 ENCOUNTER — Other Ambulatory Visit: Payer: Self-pay | Admitting: Family Medicine

## 2015-02-04 ENCOUNTER — Ambulatory Visit (INDEPENDENT_AMBULATORY_CARE_PROVIDER_SITE_OTHER): Payer: Medicare Other | Admitting: Family Medicine

## 2015-02-04 ENCOUNTER — Encounter: Payer: Self-pay | Admitting: Family Medicine

## 2015-02-04 ENCOUNTER — Ambulatory Visit (INDEPENDENT_AMBULATORY_CARE_PROVIDER_SITE_OTHER): Payer: Medicare Other | Admitting: *Deleted

## 2015-02-04 DIAGNOSIS — J069 Acute upper respiratory infection, unspecified: Secondary | ICD-10-CM | POA: Diagnosis not present

## 2015-02-04 DIAGNOSIS — I70209 Unspecified atherosclerosis of native arteries of extremities, unspecified extremity: Secondary | ICD-10-CM | POA: Diagnosis not present

## 2015-02-04 DIAGNOSIS — I4891 Unspecified atrial fibrillation: Secondary | ICD-10-CM | POA: Diagnosis present

## 2015-02-04 LAB — POCT INR: INR: 3.9

## 2015-02-04 MED ORDER — DOXYCYCLINE HYCLATE 100 MG PO TABS: 100.0000 mg | ORAL_TABLET | Freq: Two times a day (BID) | ORAL | Status: DC

## 2015-02-04 MED ORDER — BENZONATATE 100 MG PO CAPS: 100.0000 mg | ORAL_CAPSULE | Freq: Two times a day (BID) | ORAL | Status: DC | PRN

## 2015-02-04 NOTE — Progress Notes (Signed)
   Subjective:    Patient ID: Terry Martinez, male    DOB: 02-16-1941, 74 y.o.   MRN: [id number]  HPI 74 year old male presents for evaluation of cough for 3 weeks.  Recently evaluated by Dr. Lincoln Brigham and diagnosed with viral URI, he has had persistent cough, productive sputum, chills, and abdominal pain (related to cough). He has tried [employer] manager cold with no improvement of symptoms. No nausea/emesis. No chest pain. Intermittent sob with coughing. Norma BM's.    Review of Systems See above.     Objective:   Physical Exam Vitals: reviewed Gen: pleasant male, NAD, coughing during exam HEENT: normocephalic, PERRL, EOMI, TM pearly grey, mild rhinorrhea, MMM, uvula midline, no pharyngeal erythema or exudate, neck supple, no adenopathy Cardiac: RRR, S1 and S2 present, no murmurs Resp: CTAB, normal effort Abd: ventral hernia present, tenderness over hernia, no signs of strangulation       Assessment & Plan:  URI (upper respiratory infection) Patient has continued symptoms consistent with viral URI -start Doxycycline as previous smoker(on Warfarin so will need close monitoring of INR) -Tessalon pearles prescribed -Trial of Mucinex -Return precautions discussed

## 2015-02-04 NOTE — Patient Instructions (Signed)
It was nice to meet you today.  I think your symptoms are related to a virus. However, since they have been ongoing I will start Doxycycline 100 mg twice daily for 7 day. Please also start Tessalon Pearles twice daily for cough. Please purchase Mucinex at your pharmacy.

## 2015-02-04 NOTE — Assessment & Plan Note (Addendum)
Patient has continued symptoms consistent with viral URI -start Doxycycline as previous smoker(on Warfarin so will need close monitoring of INR) -Tessalon pearles prescribed -Trial of Mucinex -Return precautions discussed

## 2015-02-05 ENCOUNTER — Other Ambulatory Visit: Payer: Self-pay | Admitting: Family Medicine

## 2015-02-05 ENCOUNTER — Ambulatory Visit (INDEPENDENT_AMBULATORY_CARE_PROVIDER_SITE_OTHER): Payer: Medicare Other | Admitting: Podiatry

## 2015-02-05 ENCOUNTER — Encounter: Payer: Self-pay | Admitting: Podiatry

## 2015-02-05 DIAGNOSIS — E1059 Type 1 diabetes mellitus with other circulatory complications: Secondary | ICD-10-CM

## 2015-02-05 DIAGNOSIS — B351 Tinea unguium: Secondary | ICD-10-CM

## 2015-02-05 DIAGNOSIS — M79673 Pain in unspecified foot: Secondary | ICD-10-CM

## 2015-02-05 DIAGNOSIS — E1040 Type 1 diabetes mellitus with diabetic neuropathy, unspecified: Secondary | ICD-10-CM

## 2015-02-05 NOTE — Progress Notes (Signed)
Subjective:     Patient ID: Terry Martinez, male   DOB: [birth year], 74 y.o.   MRN: [id number]  HPIThis patient presents to the office for preventive foot care services for his feet.  His nails have grown thick and long and pain is present walking and wearing shoes.  He has history of amputation 1,5 toes right foot.  His surgical sites have healed and he mentions he is interested in receiving diabetic shoes.   Review of Systems     Objective:   Physical Exam GENERAL APPEARANCE: Alert, conversant. Appropriately groomed. No acute distress.  VASCULAR: Pedal pulses palpable at  [facility] and PT bilateral.  Capillary refill time is immediate to all digits,  Normal temperature gradient.  Digital hair growth is present bilateral  NEUROLOGIC: sensation is normal to 5.07 monofilament at 5/5 sites right.  Sensation is absent LOPS left foot.[place] touch is intact bilateral, Muscle strength normal.  MUSCULOSKELETAL: acceptable muscle strength, tone and stability bilateral.  Intrinsic muscluature intact bilateral.  Rectus appearance of foot and digits noted bilateral. Amputation hallux and fifth toes right foot.    DERMATOLOGIC: skin color, texture, and turgor are within normal limits.  No preulcerative lesions or ulcers  are seen, no interdigital maceration noted.  No open lesions present.  Digital nails are asymptomatic. No drainage noted.      Assessment:     Onychomycosis  Diabetic Neuropathy     Plan:     Debridement of nails.  Initiate paperwork for diabetic shoes.  RTC 3 months  Gardiner Barefoot DPM

## 2015-02-09 ENCOUNTER — Ambulatory Visit: Payer: Medicare Other

## 2015-02-12 ENCOUNTER — Ambulatory Visit (INDEPENDENT_AMBULATORY_CARE_PROVIDER_SITE_OTHER): Payer: Medicare Other | Admitting: *Deleted

## 2015-02-12 DIAGNOSIS — I4891 Unspecified atrial fibrillation: Secondary | ICD-10-CM | POA: Diagnosis present

## 2015-02-12 LAB — POCT INR: INR: 2.1

## 2015-02-26 ENCOUNTER — Encounter: Payer: Self-pay | Admitting: Family Medicine

## 2015-02-26 ENCOUNTER — Telehealth: Payer: Self-pay | Admitting: *Deleted

## 2015-02-26 ENCOUNTER — Ambulatory Visit: Payer: Medicare Other | Admitting: Family Medicine

## 2015-02-26 ENCOUNTER — Ambulatory Visit (INDEPENDENT_AMBULATORY_CARE_PROVIDER_SITE_OTHER): Payer: Medicare Other | Admitting: *Deleted

## 2015-02-26 ENCOUNTER — Ambulatory Visit (INDEPENDENT_AMBULATORY_CARE_PROVIDER_SITE_OTHER): Payer: Medicare Other | Admitting: Family Medicine

## 2015-02-26 VITALS — BP 166/66 | HR 75 | Temp 98.2°F | Ht 75.0 in | Wt 289.7 lb

## 2015-02-26 DIAGNOSIS — J069 Acute upper respiratory infection, unspecified: Secondary | ICD-10-CM

## 2015-02-26 DIAGNOSIS — I4891 Unspecified atrial fibrillation: Secondary | ICD-10-CM

## 2015-02-26 LAB — POCT INR: INR: 2.9

## 2015-02-26 MED ORDER — PREDNISONE 50 MG PO TABS: 50.0000 mg | ORAL_TABLET | Freq: Every day | ORAL | Status: DC

## 2015-02-26 MED ORDER — DOXYCYCLINE HYCLATE 100 MG PO TABS: 100.0000 mg | ORAL_TABLET | Freq: Two times a day (BID) | ORAL | Status: DC

## 2015-02-26 NOTE — Progress Notes (Signed)
URI: Same-day appointment Had cold ~37month ago. Was getting over it. Went to hospital to visit sister who was an inpatient (2 days ago). Then the following morning (yesterday) had developed cough, ST, rhinorrhea, chills, nausea. No vomiting, no diarrhea (but upset stomach may mean "it's coming")  Has been sick for 2 days. Nasal discharge: yes Medications tried: took 3 remaining Doxycycline tabs (yesterday AM/PM, and this AM), tylenol Sick contacts: sister hospitalized w/ PNA  Symptoms Fever: no Headache or face pain: no Tooth pain: no Sneezing: yes Scratchy throat: yes Allergies: no Muscle aches: no Severe fatigue: yes Stiff neck: no Shortness of breath: yes, worsened w/ coughing Rash: no Sore throat or swollen glands: yes   ROS see HPI Smoking Status noted  Objective: BP 166/66 mmHg  Pulse 75  Temp(Src) 98.2 F (36.8 C) (Oral)  Ht 6\' 3"  (1.905 m)  Wt 289 lb 11.2 oz (131.407 kg)  BMI 36.21 kg/m2 Gen: NAD, alert, cooperative, and pleasant. HEENT: NCAT, EOMI, PERRL, TMs clear bilaterally. OP erythematous and cobblestoned. No evidence of exudate. Nasal mucosa with clear rhinorrhea present. CV: Regular rate, slightly irregular rhythm. Holosystolic murmur noted. Resp: Crackles/wheezes noted greatest at right lower lobe. Diffuse wheezes noted within the left lung without evidence of crackles. No increased work of breathing.   Assessment and plan:  URI (upper respiratory infection) Patient presents today with signs and symptoms most consistent with upper respiratory infection. He had been previously treated with doxycycline back in December of last year. Patient has crackles and some wheezing in the right lower lobe with concerns for possible consolidation. Patient has been afebrile without any significant systemic symptoms at this time. Patient has taken a total of 3 doses of doxycycline which was left over from his previous post prescription back in December (yesterday a.m.,  p.m., and this morning). - We'll treat with doxycycline 100 mg twice a day. I provided him 8.5 days worth of additional antibiotic due to his 1.5 days he had left over from previous restriction.  - Patient is currently taking warfarin. I discussed this with our lab technician and he has agreed to discuss reduction of warfarin dosing with the patient. - Advised to use Mucinex over-the-counter for symptom management as well as Tylenol for any aches pains discomfort or fevers. - I also provided him 3 days of prednisone 50 mg, secondary to the diffuse wheezing noted in his lungs bilaterally. - Patient was asked to follow-up in our office in 2 weeks if his symptoms do not improve. He was also asked to make a appointment sooner if he begins to spike any fevers through the antibiotic treatments.  Next: If patient spikes a fever through this antibiotic therapy or has persistent/worsening symptoms over the next 1-2 weeks then one may want to consider chest x-ray and broadening of antibiotic therapy with careful consideration of current warfarin prescription and INR.    Meds ordered this encounter  Medications  . doxycycline (VIBRA-TABS) 100 MG tablet    Sig: Take 1 tablet (100 mg total) by mouth 2 (two) times daily.    Dispense:  17 tablet    Refill:  0  . predniSONE (DELTASONE) 50 MG tablet    Sig: Take 1 tablet (50 mg total) by mouth daily with breakfast.    Dispense:  3 tablet    Refill:  0     Elberta Leatherwood, MD,MS,  PGY2 02/26/2015 3:34 PM

## 2015-02-26 NOTE — Assessment & Plan Note (Addendum)
Patient presents today with signs and symptoms most consistent with upper respiratory infection. He had been previously treated with doxycycline back in December of last year. Patient has crackles and some wheezing in the right lower lobe with concerns for possible consolidation. Patient has been afebrile without any significant systemic symptoms at this time. Patient has taken a total of 3 doses of doxycycline which was left over from his previous post prescription back in December (yesterday a.m., p.m., and this morning). - We'll treat with doxycycline 100 mg twice a day. I provided him 8.5 days worth of additional antibiotic due to his 1.5 days he had left over from previous restriction.  - Patient is currently taking warfarin. I discussed this with our lab technician and he has agreed to discuss reduction of warfarin dosing with the patient. - Advised to use Mucinex over-the-counter for symptom management as well as Tylenol for any aches pains discomfort or fevers. - I also provided him 3 days of prednisone 50 mg, secondary to the diffuse wheezing noted in his lungs bilaterally. - Patient was asked to follow-up in our office in 2 weeks if his symptoms do not improve. He was also asked to make a appointment sooner if he begins to spike any fevers through the antibiotic treatments.  Next: If patient spikes a fever through this antibiotic therapy or has persistent/worsening symptoms over the next 1-2 weeks then one may want to consider chest x-ray and broadening of antibiotic therapy with careful consideration of current warfarin prescription and INR.

## 2015-02-26 NOTE — Telephone Encounter (Signed)
Left message for patient to return call. He was started on antibiotics today and we will need to change his coumadin dosing while he is on the antibiotics. He was originally told at his office visit to stay on the same dose but he WILL need to decrease his dosing for the next two weeks. Please instruct him to alt between 2.5mg  and 5 mg or 1 tab one day and 1/2 tab the next. He will need to follow up with a lab visit in 2 weeks. Busick, Kevin Fenton

## 2015-02-26 NOTE — Patient Instructions (Signed)
Cough Treatment - you should: - Take the prescribed doxycycline 1 tablet twice a day until the bottle is complete (8 additional days). - Take the prescribed prednisone 1 tablet once a day until the bottles complete (3 days). -  Take over-the-counter Tylenol as directed on the bottle for fever, pain, and/or inflammation. - You may want pickup over-the-counter Mucinex to help with your congestion. Take this medication as prescribed on the bottle. -   Over-the-counter nasal saline spray may also help with much of the nasal congestion you may have.  You should be better in: 5-7 days Call us if you have severe shortness of breath, high fever or are not better in 2 weeks.  Lastly, make sure to pay close attention to your INR levels. Some antibiotics can have adverse reactions to the warfarin you're currently taking.

## 2015-03-03 NOTE — Telephone Encounter (Signed)
Attempted to call patient back but the number listed has been disconnected. Unable to reach patient with dosing instructions for his coumadin while on antibiotics.Busick, Kevin Fenton

## 2015-03-04 ENCOUNTER — Other Ambulatory Visit: Payer: Self-pay | Admitting: Family Medicine

## 2015-03-04 NOTE — Telephone Encounter (Signed)
Not Dr. Lacinda Axon patient any longer.

## 2015-03-04 NOTE — Telephone Encounter (Signed)
No longer under the care of [facility], DO

## 2015-03-10 ENCOUNTER — Ambulatory Visit (INDEPENDENT_AMBULATORY_CARE_PROVIDER_SITE_OTHER): Payer: Medicare Other | Admitting: *Deleted

## 2015-03-10 DIAGNOSIS — I4891 Unspecified atrial fibrillation: Secondary | ICD-10-CM | POA: Diagnosis present

## 2015-03-10 LAB — POCT INR: INR: 2

## 2015-03-12 ENCOUNTER — Ambulatory Visit: Payer: Medicare Other

## 2015-03-20 ENCOUNTER — Other Ambulatory Visit: Payer: Self-pay | Admitting: Family Medicine

## 2015-04-01 ENCOUNTER — Encounter: Payer: Self-pay | Admitting: Vascular Surgery

## 2015-04-07 ENCOUNTER — Ambulatory Visit (HOSPITAL_COMMUNITY)
Admission: RE | Admit: 2015-04-07 | Discharge: 2015-04-07 | Disposition: A | Discharge status: Home / Self Care | Payer: Medicare Other | Source: Ambulatory Visit | Attending: Vascular Surgery | Admitting: Vascular Surgery

## 2015-04-07 ENCOUNTER — Encounter: Payer: Self-pay | Admitting: Vascular Surgery

## 2015-04-07 ENCOUNTER — Ambulatory Visit (INDEPENDENT_AMBULATORY_CARE_PROVIDER_SITE_OTHER): Payer: Medicare Other | Admitting: Vascular Surgery

## 2015-04-07 ENCOUNTER — Ambulatory Visit: Payer: Medicare Other

## 2015-04-07 VITALS — BP 130/53 | HR 48 | Temp 97.4°F | Resp 16 | Ht 75.0 in | Wt 291.0 lb

## 2015-04-07 DIAGNOSIS — I10 Essential (primary) hypertension: Secondary | ICD-10-CM | POA: Insufficient documentation

## 2015-04-07 DIAGNOSIS — I70209 Unspecified atherosclerosis of native arteries of extremities, unspecified extremity: Secondary | ICD-10-CM

## 2015-04-07 DIAGNOSIS — E785 Hyperlipidemia, unspecified: Secondary | ICD-10-CM | POA: Insufficient documentation

## 2015-04-07 DIAGNOSIS — R938 Abnormal findings on diagnostic imaging of other specified body structures: Secondary | ICD-10-CM | POA: Insufficient documentation

## 2015-04-07 DIAGNOSIS — R0989 Other specified symptoms and signs involving the circulatory and respiratory systems: Secondary | ICD-10-CM | POA: Diagnosis present

## 2015-04-07 NOTE — Progress Notes (Signed)
Vascular and Vein Specialist of [facility]  Patient name: Terry Martinez MRN: [id number] DOB: 1940/11/01 Sex: male  REASON FOR VISIT: Follow up of peripheral vascular disease.  HPI: Terry Martinez is a 75 y.o. male who I last saw on 09/30/2014. I've been following a right foot wound. He has undergone previous revascularization in [facility]. He has a left axillobifemoral bypass graft. I had performed a ray amputation of the right great toe in December 2015. Of note he also has a stent in the left subclavian artery. The patient underwent an arteriogram in March 2016 and I thought he had adequate circulation to heal the wound in the right foot. The right limb of his graft was anastomosed to the proximal right superficial femoral artery. It was no significant infrainguinal arterial occlusive disease. He comes in for a 6 month follow up visit.  He denies claudication in either lower extremity. He denies rest pain or nonhealing ulcers. He has no specific complaints today.   Past Medical History  Diagnosis Date  . Hyperlipidemia   . Hypertension   . COPD (chronic obstructive pulmonary disease) ([place])   . Peripheral artery disease ([place])   . Atrial fibrillation ([place])   . Chronic pain   . Pneumonia   . Headache     occasional  . Cancer (HCC)     squamous cell carcinoma - left arm  . Anemia     hx low iron  . Myocardial infarction ([place])     3 stents  . Umbilical hernia   . Family history of adverse reaction to anesthesia     sister has difficulty waking up  . History of kidney stones   . Constipation   . DVT, lower extremity ([place])     many years  . Incisional hernia     x 2    Family History  Problem Relation Age of Onset  . Diabetes Mother   . Cancer Mother     Right Breast  . Heart disease Mother   . Hyperlipidemia Mother   . Hypertension Mother   . Lymphoma Mother     Chemo  . Cancer Father   . Cancer Brother   . Heart disease Brother   . Depression Brother    . Early death Brother   . Hyperlipidemia Brother   . Hypertension Brother   . Alcohol abuse Sister   . Heart disease Sister   . Hyperlipidemia Sister   . Hypertension Sister   . Stroke Sister   . Heart disease Maternal Grandmother   . Kidney disease Maternal Grandmother   . Heart disease Maternal Grandfather   . Kidney disease Maternal Grandfather     SOCIAL HISTORY: Social History  Substance Use Topics  . Smoking status: Former Smoker    Types: Cigarettes    Quit date: 07/22/1998  . Smokeless tobacco: Current User    Types: Snuff  . Alcohol Use: No    Allergies  Allergen Reactions  . Zocor [Simvastatin] Hives    Current Outpatient Prescriptions  Medication Sig Dispense Refill  . acetaminophen (TYLENOL) 500 MG tablet Take 500 mg by mouth every 6 (six) hours as needed for pain.    [name] albuterol (PROVENTIL HFA;VENTOLIN HFA) 108 (90 BASE) MCG/ACT inhaler Inhale 2 puffs into the lungs every 6 (six) hours as needed for wheezing or shortness of breath. 1 Inhaler 2  . allopurinol (ZYLOPRIM) 100 MG tablet TAKE ONE TABLET BY MOUTH ONCE DAILY IN THE EVENING 90 tablet 2  .  allopurinol (ZYLOPRIM) 300 MG tablet TAKE ONE TABLET BY MOUTH ONCE DAILY IN THE MORNING 90 tablet 3  . enalapril (VASOTEC) 10 MG tablet TAKE ONE TABLET BY MOUTH ONCE DAILY 90 tablet 1  . EPINEPHrine (EPI-PEN) 0.3 mg/0.3 mL SOAJ injection Inject 0.3 mLs (0.3 mg total) into the muscle once. 1 Device 1  . furosemide (LASIX) 40 MG tablet TAKE ONE TABLET BY MOUTH ONCE DAILY 30 tablet 3  . lovastatin (MEVACOR) 20 MG tablet TAKE ONE TABLET BY MOUTH AT BEDTIME 90 tablet 1  . metoprolol tartrate (LOPRESSOR) 25 MG tablet Take 0.5 tablets (12.5 mg total) by mouth 2 (two) times daily. 60 tablet 6  . Multiple Vitamins-Minerals (MENS MULTIVITAMIN PLUS PO) Take 1 tablet by mouth daily.    . nitroGLYCERIN (NITROSTAT) 0.4 MG SL tablet Place 1 tablet (0.4 mg total) under the tongue every 5 (five) minutes as needed for chest pain. 90  tablet 3  . polyethylene glycol powder (GLYCOLAX/MIRALAX) powder DISSOLVE 17G OF POWDER IN 8 OUNCES OF LIQUID AND DRINK 2 TIMES PER DAY AS NEEDED FOR MILD CONSTIPATION. 527 g 1  . potassium chloride SA (K-DUR,KLOR-CON) 20 MEQ tablet Take 1 tablet (20 mEq total) by mouth daily. 90 tablet 3  . predniSONE (DELTASONE) 50 MG tablet Take 1 tablet (50 mg total) by mouth daily with breakfast. 3 tablet 0  . tamsulosin (FLOMAX) 0.4 MG CAPS capsule TAKE ONE CAPSULE BY MOUTH EVERY DAY    . warfarin (COUMADIN) 5 MG tablet Take 1 tablet (5 mg total) by mouth daily. Restart 1 tablet 5 mg daily on 02/22/13 90 tablet 3  . benzonatate (TESSALON) 100 MG capsule Take 1 capsule (100 mg total) by mouth 2 (two) times daily as needed for cough. (Patient not taking: Reported on 04/07/2015) 30 capsule 1  . doxycycline (VIBRA-TABS) 100 MG tablet Take 1 tablet (100 mg total) by mouth 2 (two) times daily. (Patient not taking: Reported on 04/07/2015) 17 tablet 0  . metoprolol tartrate (LOPRESSOR) 25 MG tablet TAKE ONE TABLET BY MOUTH TWICE DAILY (Patient not taking: Reported on 04/07/2015) 60 tablet 0   No current facility-administered medications for this visit.    REVIEW OF SYSTEMS:  [X]  denotes positive finding, [ ]  denotes negative finding Cardiac  Comments:  Chest pain or chest pressure:    Shortness of breath upon exertion:    Short of breath when lying flat:    Irregular heart rhythm:        Vascular    Pain in calf, thigh, or hip brought on by ambulation:    Pain in feet at night that wakes you up from your sleep:     Blood clot in your veins:    Leg swelling:         Pulmonary    Oxygen at home:    Productive cough:     Wheezing:         Neurologic    Sudden weakness in arms or legs:     Sudden numbness in arms or legs:     Sudden onset of difficulty speaking or slurred speech:    Temporary loss of vision in one eye:     Problems with dizziness:         Gastrointestinal    Blood in stool:     Vomited  blood:         Genitourinary    Burning when urinating:     Blood in urine:  Psychiatric    Major depression:         Hematologic    Bleeding problems:    Problems with blood clotting too easily:        Skin    Rashes or ulcers:        Constitutional    Fever or chills:      PHYSICAL EXAM: Filed Vitals:   04/07/15 1053 04/07/15 1101  BP: 146/66 130/53  Pulse: 63 48  Temp: 97.4 F (36.3 C)   TempSrc: Oral   Resp: 16   Height: 6\' 3"  (1.905 m)   Weight: 291 lb (131.997 kg)   SpO2: 99%     GENERAL: The patient is a well-nourished male, in no acute distress. The vital signs are documented above. CARDIAC: There is a regular rate and rhythm.  VASCULAR: I do not detect carotid bruits. Cannot palpate femoral pulses or pedal pulses however both feet are warm and well-perfused. The wounds on his right foot are all completely healed. He does have hyperpigmentation bilaterally consistent with chronic venous insufficiency. PULMONARY: There is good air exchange bilaterally without wheezing or rales. ABDOMEN: Soft and non-tender with normal pitched bowel sounds.  MUSCULOSKELETAL: There are no major deformities or cyanosis. NEUROLOGIC: No focal weakness or paresthesias are detected. SKIN: There are no ulcers or rashes noted. PSYCHIATRIC: The patient has a normal affect.  DATA:  I have independently interpreted his arterial Doppler study which shows a monophasic right posterior tibial signal with a biphasic dorsalis pedis signal. ABI on the right is 82%. On the left side he has a biphasic posterior tibial signal and dorsalis pedis signal. ABI on the left is 84%.  MEDICAL ISSUES:   PERIPHERAL VASCULAR DISEASE:  His left axillobifemoral bypass graft is patent with good perfusion of both feet and no symptoms currently. Given his chronic venous insufficiency I have encouraged him to elevate his legs. Encouraged him to stay as active as possible. He does not smoke. I've ordered  follow up ABIs in 1 year and I'll see him back at that time. He knows to call sooner if he has problems.  Deitra Mayo [facility] [place]: [phone]

## 2015-04-07 NOTE — Addendum Note (Signed)
Addended by: Dorthula Rue L on: 04/07/2015 01:42 PM   Modules accepted: Orders

## 2015-04-07 NOTE — Progress Notes (Signed)
Filed Vitals:   04/07/15 1053 04/07/15 1101  BP: 146/66 130/53  Pulse: 63 48  Temp: 97.4 F (36.3 C)   TempSrc: Oral   Resp: 16   Height: 6\' 3"  (1.905 m)   Weight: 291 lb (131.997 kg)   SpO2: 99%

## 2015-04-13 ENCOUNTER — Other Ambulatory Visit: Payer: Self-pay | Admitting: Family Medicine

## 2015-04-13 ENCOUNTER — Ambulatory Visit (INDEPENDENT_AMBULATORY_CARE_PROVIDER_SITE_OTHER): Payer: Medicare Other | Admitting: *Deleted

## 2015-04-13 DIAGNOSIS — I4891 Unspecified atrial fibrillation: Secondary | ICD-10-CM | POA: Diagnosis not present

## 2015-04-13 DIAGNOSIS — Z85828 Personal history of other malignant neoplasm of skin: Secondary | ICD-10-CM

## 2015-04-13 LAB — POCT INR: INR: 2.1

## 2015-04-15 ENCOUNTER — Other Ambulatory Visit: Payer: Self-pay | Admitting: Family Medicine

## 2015-04-15 NOTE — Telephone Encounter (Signed)
Please advise refill as Dr. Lacinda Axon is in a new office.  Thanks, Lavella Lemons, RN

## 2015-04-20 DIAGNOSIS — C44629 Squamous cell carcinoma of skin of left upper limb, including shoulder: Secondary | ICD-10-CM | POA: Diagnosis not present

## 2015-04-20 DIAGNOSIS — L578 Other skin changes due to chronic exposure to nonionizing radiation: Secondary | ICD-10-CM | POA: Diagnosis not present

## 2015-04-20 DIAGNOSIS — L821 Other seborrheic keratosis: Secondary | ICD-10-CM | POA: Diagnosis not present

## 2015-04-26 ENCOUNTER — Other Ambulatory Visit: Payer: Self-pay | Admitting: Family Medicine

## 2015-05-01 ENCOUNTER — Other Ambulatory Visit: Payer: Self-pay | Admitting: Family Medicine

## 2015-05-07 ENCOUNTER — Other Ambulatory Visit: Payer: Self-pay | Admitting: *Deleted

## 2015-05-07 ENCOUNTER — Ambulatory Visit: Payer: Medicare Other | Admitting: Podiatry

## 2015-05-07 MED ORDER — POLYETHYLENE GLYCOL 3350 17 GM/SCOOP PO POWD: ORAL | Status: DC

## 2015-05-11 ENCOUNTER — Ambulatory Visit: Payer: Medicare Other

## 2015-05-14 ENCOUNTER — Encounter: Payer: Self-pay | Admitting: Podiatry

## 2015-05-14 ENCOUNTER — Ambulatory Visit (INDEPENDENT_AMBULATORY_CARE_PROVIDER_SITE_OTHER): Payer: Medicare Other | Admitting: Podiatry

## 2015-05-14 ENCOUNTER — Ambulatory Visit: Payer: Medicare Other | Admitting: Podiatry

## 2015-05-14 ENCOUNTER — Ambulatory Visit (INDEPENDENT_AMBULATORY_CARE_PROVIDER_SITE_OTHER): Payer: Medicare Other | Admitting: *Deleted

## 2015-05-14 DIAGNOSIS — B351 Tinea unguium: Secondary | ICD-10-CM

## 2015-05-14 DIAGNOSIS — I4891 Unspecified atrial fibrillation: Secondary | ICD-10-CM

## 2015-05-14 DIAGNOSIS — M79673 Pain in unspecified foot: Secondary | ICD-10-CM

## 2015-05-14 DIAGNOSIS — S98111A Complete traumatic amputation of right great toe, initial encounter: Secondary | ICD-10-CM

## 2015-05-14 DIAGNOSIS — Z89411 Acquired absence of right great toe: Secondary | ICD-10-CM

## 2015-05-14 LAB — POCT INR: INR: 2.6

## 2015-05-16 NOTE — Progress Notes (Signed)
Subjective:     Patient ID: Terry Martinez, male   DOB: 1940/09/28, 75 y.o.   MRN: [id number]  HPI patient presents stating he needs his nails cut and also he needs new diabetic shoes. 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 He does have numerous risk factors with history of amputation and today debrided nailbeds 234 on the right and 1 through 5 left with no iatrogenic bleeding noted

## 2015-05-24 DIAGNOSIS — L97411 Non-pressure chronic ulcer of right heel and midfoot limited to breakdown of skin: Secondary | ICD-10-CM | POA: Diagnosis not present

## 2015-06-07 ENCOUNTER — Ambulatory Visit (INDEPENDENT_AMBULATORY_CARE_PROVIDER_SITE_OTHER): Payer: Medicare Other | Admitting: Internal Medicine

## 2015-06-07 ENCOUNTER — Encounter: Payer: Self-pay | Admitting: Internal Medicine

## 2015-06-07 VITALS — BP 142/78 | HR 52 | Ht 75.0 in | Wt 291.0 lb

## 2015-06-07 DIAGNOSIS — I2510 Atherosclerotic heart disease of native coronary artery without angina pectoris: Secondary | ICD-10-CM | POA: Diagnosis not present

## 2015-06-07 DIAGNOSIS — I70209 Unspecified atherosclerosis of native arteries of extremities, unspecified extremity: Secondary | ICD-10-CM | POA: Diagnosis not present

## 2015-06-07 DIAGNOSIS — I4891 Unspecified atrial fibrillation: Secondary | ICD-10-CM

## 2015-06-07 NOTE — Patient Instructions (Signed)
Medication Instructions:  Your physician recommends that you continue on your current medications as directed. Please refer to the Current Medication list given to you today.   Labwork: Please have your labs drawn at your primary care physicians office and the results faxed to our office. Please take the written order given to you today, to your pcp appointment  Testing/Procedures: None ordered  Follow-Up: Your physician wants you to follow-up in: 6 months with Dr.RossYou will receive a reminder letter in the mail two months in advance. If you don't receive a letter, please call our office to schedule the follow-up appointment.   Any Other Special Instructions Will Be Listed Below (If Applicable).     If you need a refill on your cardiac medications before your next appointment, please call your pharmacy.

## 2015-06-07 NOTE — Progress Notes (Signed)
Cardiology Office Note   Date:  06/07/2015   ID:  Terry Martinez, DOB 1940-04-09, MRN [id number]  PCP:  Lavon Paganini, MD  Cardiologist:   Dorris Carnes, MD    F/U of vascular dz and afib    History of Present Illness: Terry Martinez is a 75 y.o. male with a history of CAD, PVOD and atrial fib  I sw hm in September 2016  Followed by Rosalia Hammers.    Since seen doing good  Fishing   Breathing OK  No dizzienss  No CP        Outpatient Prescriptions Prior to Visit  Medication Sig Dispense Refill  . acetaminophen (TYLENOL) 500 MG tablet Take 500 mg by mouth every 6 (six) hours as needed for pain.    [name] albuterol (PROVENTIL HFA;VENTOLIN HFA) 108 (90 BASE) MCG/ACT inhaler Inhale 2 puffs into the lungs every 6 (six) hours as needed for wheezing or shortness of breath. 1 Inhaler 2  . allopurinol (ZYLOPRIM) 100 MG tablet TAKE ONE TABLET BY MOUTH ONCE DAILY IN THE EVENING 90 tablet 2  . allopurinol (ZYLOPRIM) 300 MG tablet TAKE ONE TABLET BY MOUTH ONCE DAILY IN THE MORNING 90 tablet 3  . enalapril (VASOTEC) 10 MG tablet TAKE ONE TABLET BY MOUTH ONCE DAILY 90 tablet 1  . EPINEPHrine (EPI-PEN) 0.3 mg/0.3 mL SOAJ injection Inject 0.3 mLs (0.3 mg total) into the muscle once. 1 Device 1  . furosemide (LASIX) 40 MG tablet TAKE ONE TABLET BY MOUTH ONCE DAILY 30 tablet 3  . KLOR-CON M20 20 MEQ tablet TAKE ONE TABLET BY MOUTH ONCE DAILY 90 tablet 2  . lovastatin (MEVACOR) 20 MG tablet TAKE ONE TABLET BY MOUTH AT BEDTIME 90 tablet 1  . metoprolol tartrate (LOPRESSOR) 25 MG tablet Take 0.5 tablets (12.5 mg total) by mouth 2 (two) times daily. 60 tablet 6  . Multiple Vitamins-Minerals (MENS MULTIVITAMIN PLUS PO) Take 1 tablet by mouth daily.    . nitroGLYCERIN (NITROSTAT) 0.4 MG SL tablet Place 1 tablet (0.4 mg total) under the tongue every 5 (five) minutes as needed for chest pain. 90 tablet 3  . polyethylene glycol powder (GLYCOLAX/MIRALAX) powder DISSOLVE 17G OF POWDER IN 8 OUNCES OF LIQUID AND DRINK 2  TIMES PER DAY AS NEEDED FOR MILD CONSTIPATION. 527 g 11  . tamsulosin (FLOMAX) 0.4 MG CAPS capsule TAKE ONE CAPSULE BY MOUTH EVERY DAY    . warfarin (COUMADIN) 5 MG tablet Take 1 tablet (5 mg total) by mouth daily. Restart 1 tablet 5 mg daily on 02/22/13 90 tablet 3  . benzonatate (TESSALON) 100 MG capsule Take 1 capsule (100 mg total) by mouth 2 (two) times daily as needed for cough. (Patient not taking: Reported on 06/07/2015) 30 capsule 1  . doxycycline (VIBRA-TABS) 100 MG tablet Take 1 tablet (100 mg total) by mouth 2 (two) times daily. (Patient not taking: Reported on 06/07/2015) 17 tablet 0  . metoprolol tartrate (LOPRESSOR) 25 MG tablet TAKE ONE TABLET BY MOUTH TWICE DAILY (Patient not taking: Reported on 06/07/2015) 60 tablet 3  . predniSONE (DELTASONE) 50 MG tablet Take 1 tablet (50 mg total) by mouth daily with breakfast. (Patient not taking: Reported on 06/07/2015) 3 tablet 0   No facility-administered medications prior to visit.     Allergies:   Zocor   Past Medical History  Diagnosis Date  . Hyperlipidemia   . Hypertension   . COPD (chronic obstructive pulmonary disease) ([place])   . Peripheral artery disease ([place])   .  Atrial fibrillation ([place])   . Chronic pain   . Pneumonia   . Headache     occasional  . Cancer (HCC)     squamous cell carcinoma - left arm  . Anemia     hx low iron  . Myocardial infarction ([place])     3 stents  . Umbilical hernia   . Family history of adverse reaction to anesthesia     sister has difficulty waking up  . History of kidney stones   . Constipation   . DVT, lower extremity ([place])     many years  . Incisional hernia     x 2    Past Surgical History  Procedure Laterality Date  . Femoral-popliteal bypass graft    . Percutaneous coronary stent intervention (pci-s)    . Ankle surgery Left   . Eye surgery Bilateral     cataract surgery with lens implant  . Back surgery      2 titanium rods   . Tonsillectomy    . Colonoscopy    . Amputation  Right 02/19/2014    Procedure: AMPUTATION RAY-RIGHT GREAT TOE;  Surgeon: Angelia Mould, MD;  Location: [facility];  Service: Vascular;  Laterality: Right;  . Lower extremity angiogram N/A 05/04/2014    Procedure: LOWER EXTREMITY ANGIOGRAM;  Surgeon: Angelia Mould, MD;  Location: [facility] CATH LAB;  Service: Cardiovascular;  Laterality: N/A;  . Abdominal aortagram  05/04/2014    Procedure: ABDOMINAL AORTAGRAM;  Surgeon: Angelia Mould, MD;  Location: [facility] CATH LAB;  Service: Cardiovascular;;  . Hernia repair      umbicial  . Amputation Right 05/08/2014    Procedure: 1st Ray and 5th Ray Amputation Right Foot;  Surgeon: Newt Minion, MD;  Location: [place];  Service: Orthopedics;  Laterality: Right;     Social History:  The patient  reports that he quit smoking about 16 years ago. His smoking use included Cigarettes. His smokeless tobacco use includes Snuff. He reports that he does not drink alcohol or use illicit drugs.   Family History:  The patient's family history includes Alcohol abuse in his sister; Cancer in his brother, father, and mother; Depression in his brother; Diabetes in his mother; Early death in his brother; Heart disease in his brother, maternal grandfather, maternal grandmother, mother, and sister; Hyperlipidemia in his brother, mother, and sister; Hypertension in his brother, mother, and sister; Kidney disease in his maternal grandfather and maternal grandmother; Lymphoma in his mother; Stroke in his sister.    ROS:  Please see the history of present illness. All other systems are reviewed and  Negative to the above problem except as noted.    PHYSICAL EXAM: VS:  BP 142/78 mmHg  Pulse 52  Ht 6\' 3"  (1.905 m)  Wt 291 lb (131.997 kg)  BMI 36.37 kg/m2  GEN: Obese 75 yo in no acute distress HEENT: normal Neck: no JVD, carotid bruits, or masses Cardiac: Irreg irreg  no murmurs, rubs, or gallops,Tr edema  Respiratory:  clear to auscultation bilaterally, normal work of  breathing GI: Large ventral hernia  Scar MS: no deformity Moving all extremities   Skin: warm and dry, no rash  Chronic induration legs   Neuro:  Strength and sensation are intact Psych: euthymic mood, full affect   EKG:  EKG is ordered today.  Afib 51 bpm  Nonspecific ST     Lipid Panel    Component Value Date/Time   CHOL 72 11/24/2013 0900  TRIG 65 11/24/2013 0900   HDL 31* 11/24/2013 0900   CHOLHDL 2.3 11/24/2013 0900   VLDL 13 11/24/2013 0900   LDLCALC 28 11/24/2013 0900      Wt Readings from Last 3 Encounters:  06/07/15 291 lb (131.997 kg)  04/07/15 291 lb (131.997 kg)  02/26/15 289 lb 11.2 oz (131.407 kg)      ASSESSMENT AND PLAN: 2  CAD  No symptoms of angina  Active Note possible hernai surgery  OK to proceed without further testing   2  afib  Contine meds  Back down on lopressor to 1 x per day  3.  HTN  Follow  4.  HL  Needs labs    Check labs in mid may   Fasting    6 month return     Signed, Dorris Carnes, MD  06/07/2015 10:57 AM    [address], [place], [place]  [address] Phone: [phone]; Fax: [phone]

## 2015-06-15 ENCOUNTER — Ambulatory Visit: Payer: Medicare Other

## 2015-06-15 ENCOUNTER — Ambulatory Visit (INDEPENDENT_AMBULATORY_CARE_PROVIDER_SITE_OTHER): Payer: Medicare Other | Admitting: *Deleted

## 2015-06-15 DIAGNOSIS — I4891 Unspecified atrial fibrillation: Secondary | ICD-10-CM | POA: Diagnosis not present

## 2015-06-15 LAB — POCT INR: INR: 2.5

## 2015-06-16 ENCOUNTER — Ambulatory Visit: Payer: Medicare Other

## 2015-06-21 DIAGNOSIS — B351 Tinea unguium: Secondary | ICD-10-CM | POA: Diagnosis not present

## 2015-06-21 DIAGNOSIS — I70235 Atherosclerosis of native arteries of right leg with ulceration of other part of foot: Secondary | ICD-10-CM | POA: Diagnosis not present

## 2015-06-21 DIAGNOSIS — L97411 Non-pressure chronic ulcer of right heel and midfoot limited to breakdown of skin: Secondary | ICD-10-CM | POA: Diagnosis not present

## 2015-06-23 ENCOUNTER — Ambulatory Visit (INDEPENDENT_AMBULATORY_CARE_PROVIDER_SITE_OTHER): Payer: Medicare Other | Admitting: Family Medicine

## 2015-06-23 ENCOUNTER — Encounter: Payer: Self-pay | Admitting: Family Medicine

## 2015-06-23 ENCOUNTER — Ambulatory Visit (INDEPENDENT_AMBULATORY_CARE_PROVIDER_SITE_OTHER): Payer: Medicare Other | Admitting: *Deleted

## 2015-06-23 VITALS — BP 153/52 | HR 63 | Temp 97.5°F | Ht 75.0 in | Wt 284.0 lb

## 2015-06-23 DIAGNOSIS — I70209 Unspecified atherosclerosis of native arteries of extremities, unspecified extremity: Secondary | ICD-10-CM

## 2015-06-23 DIAGNOSIS — K439 Ventral hernia without obstruction or gangrene: Secondary | ICD-10-CM | POA: Diagnosis present

## 2015-06-23 DIAGNOSIS — I4891 Unspecified atrial fibrillation: Secondary | ICD-10-CM

## 2015-06-23 LAB — POCT INR: INR: 1.9

## 2015-06-23 NOTE — Progress Notes (Signed)
Date of Visit: 06/23/2015   HPI:  Patient presents today for evaluation and referral for ventral hernia.  Has had hernia in anterior abdomen for several years. Started as two small hernias but has gradually gotten bigger. Has issues with constipation, has to take medication in order to have bowel movement. Has midline vertical abdominal scar from prior aortic bypass procedure. Denies having any other abdominal surgeries. Eating and drinking well. No vomiting. Saw his cardiologist on 4/17 who recommended he see PCP for referral to either Dr. Harlow Asa or Dr. Zella Richer for surgery.  ROS: See HPI.  [place]: history of atrial fibrillation, CAD, hypertension, obesity, PAD s/p femoral bypass, hyperlipidemia, gout, on chronic coumadin  PHYSICAL EXAM: BP 153/52 mmHg  Pulse 63  Temp(Src) 97.5 F (36.4 C) (Oral)  Ht 6\' 3"  (1.905 m)  Wt 284 lb (128.822 kg)  BMI 35.50 kg/m2 Gen: NAD, pleasant, cooperative HEENT: normocephalic, atraumatic,mmm  Heart: regular rate and rhythm, no murmur Lungs: clear to auscultation bilaterally, normal work of breathing Abdomen: soft. Obese. Large ventral hernia to left of midline. See photo below. Easily reducible. Nontender.  Neuro: alert, grossly nonfocal, speech normal     ASSESSMENT/PLAN:  Abdominal wall hernia Large, requesting referral to general surgery for operative repair. Referral entered. Patient will be contacted with an appointment.  Discussed return precautions including signs of incarceration/strangulation.   FOLLOW UP: Referring to general surgery for further evaluation Instructed patient to schedule follow up with PCP for chronic medical problems.  [place]. Ardelia Mems, [place]

## 2015-06-23 NOTE — Patient Instructions (Signed)
I am referring you to a general surgeon for your hernia. You will get a phone call to schedule this appointment.   Follow up with Dr. Brita Romp when you're able about your chronic medical problems  Be well, Dr. Ardelia Mems

## 2015-06-23 NOTE — Assessment & Plan Note (Signed)
Large, requesting referral to general surgery for operative repair. Referral entered. Patient will be contacted with an appointment.  Discussed return precautions including signs of incarceration/strangulation.

## 2015-07-01 ENCOUNTER — Telehealth: Payer: Self-pay | Admitting: *Deleted

## 2015-07-01 NOTE — Telephone Encounter (Signed)
Called patient to notify him that Dr Brita Romp needs him to make an appointment to see her before she can sign paperwork.  Patient stated understanding.

## 2015-07-05 ENCOUNTER — Ambulatory Visit: Payer: Medicare Other | Admitting: Family Medicine

## 2015-07-05 ENCOUNTER — Ambulatory Visit: Payer: Self-pay | Admitting: Surgery

## 2015-07-05 DIAGNOSIS — Z01818 Encounter for other preprocedural examination: Secondary | ICD-10-CM | POA: Diagnosis not present

## 2015-07-05 DIAGNOSIS — K432 Incisional hernia without obstruction or gangrene: Secondary | ICD-10-CM | POA: Diagnosis not present

## 2015-07-05 NOTE — H&P (Signed)
[place] 07/05/2015 2:47 PM Location: [facility] Patient #: [id number] DOB: 1940/05/27 Widowed / Language: Cleophus Molt / Race: White Male  History of Present Illness Adin Hector MD; 07/05/2015 3:28 PM) Patient words: hernia.  The patient is a 75 year old male who presents with an incisional hernia. Note for "Incisional hernia": Patient sent for surgical consultation at the request of his primary care physician, Dr. Ardelia Mems. Concern for incisional hernia.  Pleasant elderly gentleman. He comes today with his brother-in-law. 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 History of exploratory laparotomy. Failed aortic graft attempt. Converted to left axillary-femorofemoral bypass grafts. Had big open incision for some of the surgeries. His noticeable lump at his incision. Concern for worsening hernias. CT scan-near showed small bowel within it as well. Chronically anticoagulated for atrial fibrillation. Follow by Dorris Carnes, MD, [facility] cardiology, feels he can tolerate such an operation if needed.  He's noticed increased bulging over the past few years. Some discomfort. There is discussion about hernia repair when he lived in [place] a few years ago. However they wanted him to lose weight. Family has relocated to [place]. He's intentionally lost 40 pounds. He feels like hernias are getting larger. He's been told her small bowel within it. Confirmed by CT scan last year. He notices some gurgling. Can reduce when he lies down but sometimes sensitive. Some episodes of sharp pain. Concerns and. He wishes to be aggressive and have surgery to fix this.  Usually has bowel movement every morning without this MiraLAX. He does walk with a cane has some moderate chronic back issues with the rods. That is stable. Gilford Rile is mainly for some neuropathy and other lower extremity issues. Helps with balance. He feels like he can do  15-20 minutes walking without much difficulty. Just a slow pace given his ankle, back, and joint pains   Other Problems Davy Pique Bynum, CMA; 07/05/2015 2:47 PM) Atrial Fibrillation Back Pain Cancer Hemorrhoids Hypercholesterolemia Inguinal Hernia Myocardial infarction Pulmonary Embolism / Blood Clot in Legs Umbilical Hernia Repair Vascular Disease  Past Surgical History Marjean Donna, CMA; 07/05/2015 2:47 PM) Bypass Surgery for Poor Blood Flow to Legs Carotid Artery Surgery Bilateral. Cataract Surgery Bilateral. Colon Polyp Removal - Colonoscopy Colon Polyp Removal - Open Coronary Artery Bypass Graft Foot Surgery Right. Resection of Stomach Spinal Surgery - Lower Back Spinal Surgery Midback TURP Ventral / Umbilical Hernia Surgery multiple  Diagnostic Studies History Marjean Donna, CMA; 07/05/2015 2:47 PM) Colonoscopy 1-5 years ago  Allergies Marjean Donna, CMA; 07/05/2015 2:48 PM) Simvastatin *ANTIHYPERLIPIDEMICS*  Medication History (Sonya Bynum, CMA; 07/05/2015 2:51 PM) Furosemide (20MG  Tablet, Oral) Active. Warfarin Sodium (5MG  Tablet, Oral) Active. Allopurinol (100MG  Tablet, Oral) Active. Enalapril Maleate (10MG  Tablet, Oral) Active. Metoprolol-HCTZ ER (50-12.5MG  Tablet ER 24HR, Oral) Active. [facility] Packet, Oral) Active. Lovastatin (20MG  Tablet, Oral) Active. Nitroglycerin (0.4MG  Tab Sublingual, Sublingual as needed) Active. Medications Reconciled  Social History Marjean Donna, CMA; 07/05/2015 2:47 PM) Caffeine use Carbonated beverages, Coffee. No alcohol use No drug use Tobacco use Current every day smoker.  Family History Marjean Donna, [place]; 07/05/2015 2:47 PM) Alcohol Abuse Brother. Arthritis Mother, Sister. Bleeding disorder Sister. Breast Cancer Mother. Cancer Father, Mother. Cerebrovascular Accident Sister. Colon Polyps Brother. Diabetes Mellitus Mother, Son. Heart Disease Brother, Mother, Sister. Heart  disease in male family member before age 75 Hypertension Brother, Sister, Son. Melanoma Family Members In General. Migraine Headache Sister. Respiratory Condition Brother, Father, Mother. Thyroid problems Sister.     Review of  Systems ([place]; 07/05/2015 2:47 PM) General Present- Fatigue. Not Present- Appetite Loss, Chills, Fever, Night Sweats, Weight Gain and Weight Loss. Skin Present- Dryness and Non-Healing Wounds. Not Present- Change in Wart/Mole, Hives, Jaundice, New Lesions, Rash and Ulcer. HEENT Present- Wears glasses/contact lenses. Not Present- Earache, Hearing Loss, Hoarseness, Nose Bleed, Oral Ulcers, Ringing in the Ears, Seasonal Allergies, Sinus Pain, Sore Throat, Visual Disturbances and Yellow Eyes. Respiratory Present- Snoring. Not Present- Bloody sputum, Chronic Cough, Difficulty Breathing and Wheezing. Breast Not Present- Breast Mass, Breast Pain, Nipple Discharge and Skin Changes. Cardiovascular Present- Leg Cramps, Palpitations, Shortness of Breath and Swelling of Extremities. Not Present- Chest Pain, Difficulty Breathing Lying Down and Rapid Heart Rate. Gastrointestinal Present- Bloating and Constipation. Not Present- Abdominal Pain, Bloody Stool, Change in Bowel Habits, Chronic diarrhea, Difficulty Swallowing, Excessive gas, Gets full quickly at meals, Hemorrhoids, Indigestion, Nausea, Rectal Pain and Vomiting. Male Genitourinary Present- Frequency and Urine Leakage. Not Present- Blood in Urine, Change in Urinary Stream, Impotence, Nocturia, Painful Urination and Urgency.  Vitals (Sonya Bynum CMA; 07/05/2015 2:48 PM) 07/05/2015 2:48 PM Weight: 290 lb Height: 75in Body Surface Area: 2.57 m Body Mass Index: 36.25 kg/m  Temp.: 37F(Temporal)  Pulse: 81 (Regular)  BP: 134/74 (Sitting, Left Arm, Standard)      Physical Exam Adin Hector MD; 07/05/2015 3:25 PM)  General Mental Status-Alert. General Appearance-Not in acute distress,  Not Sickly. Orientation-Oriented X3. Hydration-Well hydrated. Voice-Normal.  Integumentary Global Assessment Upon inspection and palpation of skin surfaces of the - Axillae: non-tender, no inflammation or ulceration, no drainage. and Distribution of scalp and body hair is normal. General Characteristics Temperature - normal warmth is noted.  Head and Neck Head-normocephalic, atraumatic with no lesions or palpable masses. Face Global Assessment - atraumatic, no absence of expression. Neck Global Assessment - no abnormal movements, no bruit auscultated on the right, no bruit auscultated on the left, no decreased range of motion, non-tender. Trachea-midline. Thyroid Gland Characteristics - non-tender.  Eye Eyeball - Left-Extraocular movements intact, No Nystagmus. Eyeball - Right-Extraocular movements intact, No Nystagmus. Cornea - Left-No Hazy. Cornea - Right-No Hazy. Sclera/Conjunctiva - Left-No scleral icterus, No Discharge. Sclera/Conjunctiva - Right-No scleral icterus, No Discharge. Pupil - Left-Direct reaction to light normal. Pupil - Right-Direct reaction to light normal. Note: Wears glasses. Vision acceptable  ENMT Ears Pinna - Left - no drainage observed, no generalized tenderness observed. Right - no drainage observed, no generalized tenderness observed. Nose and Sinuses External Inspection of the Nose - no destructive lesion observed. Inspection of the nares - Left - quiet respiration. Right - quiet respiration. Mouth and Throat Lips - Upper Lip - no fissures observed, no pallor noted. Lower Lip - no fissures observed, no pallor noted. Nasopharynx - no discharge present. Oral Cavity/Oropharynx - Tongue - no dryness observed. Oral Mucosa - no cyanosis observed. Hypopharynx - no evidence of airway distress observed.  Chest and Lung Exam Inspection Movements - Normal and Symmetrical. Accessory muscles - No use of accessory muscles in  breathing. Palpation Palpation of the chest reveals - Non-tender. Auscultation Breath sounds - Normal and Clear.  Cardiovascular Auscultation Rhythm - Regular. Murmurs & Other Heart Sounds - Auscultation of the heart reveals - No Murmurs and No Systolic Clicks.  Abdomen Inspection Inspection of the abdomen reveals - No Visible peristalsis and No Abnormal pulsations. Umbilicus - No Bleeding, No Urine drainage. Palpation/Percussion Palpation and Percussion of the abdomen reveal - Soft, Non Tender, No Rebound tenderness, No Rigidity (guarding) and No Cutaneous hyperesthesia. Note:  Obese but soft. Large midline incision. A 10 x 20 cm region of bulging. 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 Most of them 3-4 cm size defects  Male Genitourinary Sexual Maturity Tanner 5 - Adult hair pattern and Adult penile size and shape. Note: No inguinal hernias. No lymphadenopathy. Normal external genitalia.  Peripheral Vascular Upper Extremity Inspection - Left - No Cyanotic nailbeds, Not Ischemic. Right - No Cyanotic nailbeds, Not Ischemic.  Neurologic Neurologic evaluation reveals -normal attention span and ability to concentrate, able to name objects and repeat phrases. Appropriate fund of knowledge , normal sensation and normal coordination. Mental Status Affect - not angry, not paranoid. Cranial Nerves-Normal Bilaterally. Gait-Normal.  Neuropsychiatric Mental status exam performed with findings of-able to articulate well with normal speech/language, rate, volume and coherence, thought content normal with ability to perform basic computations and apply abstract reasoning and no evidence of hallucinations, delusions, obsessions or homicidal/suicidal ideation.  Musculoskeletal Global Assessment Spine, Ribs and Pelvis - no instability, subluxation or laxity. Right Upper Extremity - no instability, subluxation or laxity.  Lymphatic Head & Neck  General Head & Neck  Lymphatics: Bilateral - Description - No Localized lymphadenopathy. Axillary  General Axillary Region: Bilateral - Description - No Localized lymphadenopathy. Femoral & Inguinal  Generalized Femoral & Inguinal Lymphatics: Left - Description - No Localized lymphadenopathy. Right - Description - No Localized lymphadenopathy.    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 Increasing in size and discomfort. Small bowel within certain areas.  I think he would benefit from surgical repair. Most likely a combination of laparoscopic underlay mesh with primary closure of the larger areas. Possible conversion to open with component separation and closure over drains if that defects are too large. I suspect most the defects are 4 cm or less. Just a lot of Swiss cheese. Most likely we'll need to excise excess skin. We will see.  I cautioned him that this will be a very sore surgery and he will need pain medications. He is optimistic he'll only need Tylenol. I cautioned him that was not realistic. His brother-in-law agreed.  His risks are increased but he has been cleared by cardiology. Need systolic was warfarin 5 days preop. However his risks and emergency center to be much much higher. Likely cannot do mesh underlay which remained failure rate of hernia repair would be much higher. It is getting larger. It is bothering him. He is hurting him. He is worried about small bowel within it. He wishes to have it repaired. Her repair was discussed a few years ago when he lived in [place]. Concern of his obesity making recurrence rate higher. He still had a large obese man, he's lost 40 pounds since then. He is motivated to exercise more and try and keep the weight off. Trying to lose even more weight.  ENCOUNTER FOR PRE-OPERATIVE  EXAMINATION - [place] GO:[id number])  Current Plans You are being scheduled for surgery - Our schedulers will call you.  You should hear from our office's scheduling department within 5 working days about the location, date, and time of surgery. We try to make accommodations for patient's preferences in scheduling surgery, but sometimes the OR schedule or the surgeon's schedule prevents [place] from making those accommodations.  If you have not heard from our office [phone]) in 5  working days, call the office and ask for your surgeon's nurse.  If you have other questions about your diagnosis, plan, or surgery, call the office and ask for your surgeon's nurse.  Written instructions provided The anatomy & physiology of the abdominal wall was discussed. The pathophysiology of hernias was discussed. Natural history risks without surgery including progeressive enlargement, pain, incarceration, & strangulation was discussed. Contributors to complications such as smoking, obesity, diabetes, prior surgery, etc were discussed.  I feel the risks of no intervention will lead to serious problems that outweigh the operative risks; therefore, I recommended surgery to reduce and repair the hernia. I explained laparoscopic techniques with possible need for an open approach. I noted the probable use of mesh to patch and/or buttress the hernia repair  Risks such as bleeding, infection, abscess, need for further treatment, heart attack, death, and other risks were discussed. I noted a good likelihood this will help address the problem. Goals of post-operative recovery were discussed as well. Possibility that this will not correct all symptoms was explained. I stressed the importance of low-impact activity, aggressive pain control, avoiding constipation, & not pushing through pain to minimize risk of post-operative chronic pain or injury. Possibility of reherniation especially with smoking, obesity, diabetes, immunosuppression,  and other health conditions was discussed. We will work to minimize complications.  An educational handout further explaining the pathology & treatment options was given as well. Questions were answered. The patient expresses understanding & wishes to proceed with surgery.  Pt Education - Pamphlet Given - Laparoscopic Hernia Repair: discussed with patient and provided information. Pt Education - [facility] Hernia Post-Op HCI (Tu Bayle): discussed with patient and provided information. Pt Education - [facility] Pain Control (Rever Pichette)  Adin Hector, M.D., F.A.C.S. Gastrointestinal and Minimally Invasive Surgery [facility] [address] [place], [place] [id number] [phone] Main / Paging

## 2015-07-12 ENCOUNTER — Ambulatory Visit: Payer: Medicare Other | Admitting: Family Medicine

## 2015-07-12 DIAGNOSIS — L97411 Non-pressure chronic ulcer of right heel and midfoot limited to breakdown of skin: Secondary | ICD-10-CM | POA: Diagnosis not present

## 2015-07-21 ENCOUNTER — Other Ambulatory Visit: Payer: Self-pay | Admitting: Vascular Surgery

## 2015-07-21 ENCOUNTER — Other Ambulatory Visit: Payer: Self-pay | Admitting: Family Medicine

## 2015-07-21 NOTE — Telephone Encounter (Signed)
This patient's anticoag meds are managed by the Family [facility] Residents  - Dr. Levada Dy. Bacigalupo

## 2015-07-22 ENCOUNTER — Ambulatory Visit: Payer: Medicare Other

## 2015-07-24 ENCOUNTER — Other Ambulatory Visit: Payer: Self-pay | Admitting: Vascular Surgery

## 2015-07-27 ENCOUNTER — Ambulatory Visit (INDEPENDENT_AMBULATORY_CARE_PROVIDER_SITE_OTHER): Payer: Medicare Other | Admitting: Family Medicine

## 2015-07-27 ENCOUNTER — Encounter: Payer: Self-pay | Admitting: Family Medicine

## 2015-07-27 ENCOUNTER — Ambulatory Visit (INDEPENDENT_AMBULATORY_CARE_PROVIDER_SITE_OTHER): Payer: Medicare Other | Admitting: *Deleted

## 2015-07-27 VITALS — Temp 97.7°F | Ht 75.0 in | Wt 289.0 lb

## 2015-07-27 DIAGNOSIS — I739 Peripheral vascular disease, unspecified: Secondary | ICD-10-CM | POA: Diagnosis not present

## 2015-07-27 DIAGNOSIS — I70209 Unspecified atherosclerosis of native arteries of extremities, unspecified extremity: Secondary | ICD-10-CM

## 2015-07-27 DIAGNOSIS — I4891 Unspecified atrial fibrillation: Secondary | ICD-10-CM

## 2015-07-27 DIAGNOSIS — Z7901 Long term (current) use of anticoagulants: Secondary | ICD-10-CM | POA: Diagnosis not present

## 2015-07-27 DIAGNOSIS — K439 Ventral hernia without obstruction or gangrene: Secondary | ICD-10-CM | POA: Diagnosis not present

## 2015-07-27 LAB — POCT INR: INR: 1.9

## 2015-07-27 MED ORDER — METRONIDAZOLE 1 % EX GEL: Freq: Every day | CUTANEOUS | Status: DC

## 2015-07-27 MED ORDER — WARFARIN SODIUM 5 MG PO TABS: 5.0000 mg | ORAL_TABLET | Freq: Every day | ORAL | Status: DC

## 2015-07-27 MED ORDER — TRIAMCINOLONE ACETONIDE 0.5 % EX OINT: 1.0000 "application " | TOPICAL_OINTMENT | Freq: Two times a day (BID) | CUTANEOUS | Status: DC | PRN

## 2015-07-27 MED ORDER — POTASSIUM CHLORIDE CRYS ER 20 MEQ PO TBCR: 20.0000 meq | EXTENDED_RELEASE_TABLET | Freq: Every day | ORAL | Status: DC

## 2015-07-27 NOTE — Patient Instructions (Addendum)
Nice to see you again today.  You can use the triamcinolone ointment for foreskin adhesions as needed. Do not use this for more than a week at a time. The MetroGel is the rosacea cream.  For your surgery, stop Coumadin 5 days before your surgery and restart the day after surgery at your current dose. You do not need any Lovenox during those 6 days.  Take care, Dr. Jacinto Reap

## 2015-07-27 NOTE — Assessment & Plan Note (Signed)
INR check today Continue Coumadin Hold Coumadin for 5 days prior to upcoming surgery and resume one day after surgery No Lovenox bridge required as patient is taking Coumadin for atrial fibrillation only Plan to follow INR closely after resuming Coumadin following surgery

## 2015-07-27 NOTE — Assessment & Plan Note (Signed)
Plan to complete paperwork for diabetic shoes when podiatry faxes over

## 2015-07-27 NOTE — Progress Notes (Signed)
   Subjective:   Terry Martinez is a 75 y.o. male with a history of PAD, HTN, A fib, CAD, HLD, abd wall hernia here for Discussion of anticoagulation around upcoming surgery  Abd hernia repair surgery - scheduled for surgery with gen surg 08/11/15 - cleared by cardiology on 06/07/15 without further testing - On Coumadin long-term for atrial fibrillation - no history of VTE or stroke - currently taking coumadin 5mg  daily, except Tues and Fri 2.5mg  - getting INR checked today  PAD - being treated by Dr Sharol Given for R foot ulcer - no h/o DM - is healing well, by [facility] would like to order him another pair of diabetic shoes  Patient reports he would like a refiull of a cream that he used intermittently for adhesions of foreskin - he doesn't remember name and has run out  Review of Systems:  Per HPI.   Social History: former smoker  Objective:  Temp(Src) 97.7 F (36.5 C) (Oral)  Ht 6\' 3"  (1.905 m)  Wt 289 lb (131.09 kg)  BMI 36.12 kg/m2  Gen:  75 y.o. male in NAD HEENT: NCAT, MMM, EOMI, PERRL, anicteric sclerae CV: irreg, irreg, no MRG Resp: Non-labored, CTAB, no wheezes noted Abd: Soft, NTND, BS present, no guarding or organomegaly, large ventral hernia noted - able to reduce MSK: 2cm callus on plantar surface of R foot with central ulceration, no signs of infection Neuro: Alert and oriented, speech normal      Chemistry      Component Value Date/Time   NA 138 05/08/2014 1415   K 4.3 05/08/2014 1415   CL 102 05/08/2014 1415   CO2 26 05/08/2014 1415   BUN 19 05/08/2014 1415   CREATININE 1.28 05/08/2014 1415   CREATININE 1.30 03/27/2014 0833      Component Value Date/Time   CALCIUM 9.3 05/08/2014 1415   ALKPHOS 88 05/08/2014 1415   AST 19 05/08/2014 1415   ALT 12 05/08/2014 1415   BILITOT 0.6 05/08/2014 1415      Lab Results  Component Value Date   WBC 7.9 05/08/2014   HGB 12.9* 05/08/2014   HCT 40.6 05/08/2014   MCV 90.8 05/08/2014   PLT 173 05/08/2014     No results found for: TSH Lab Results  Component Value Date   HGBA1C 5.7 10/05/2014   Assessment & Plan:     Terry Martinez is a 75 y.o. male here for  Atrial fibrillation INR check today Continue Coumadin Hold Coumadin for 5 days prior to upcoming surgery and resume one day after surgery No Lovenox bridge required as patient is taking Coumadin for atrial fibrillation only Plan to follow INR closely after resuming Coumadin following surgery  PAD (peripheral artery disease) Plan to complete paperwork for diabetic shoes when podiatry faxes over    [name], MD MPH PGY-2,  [facility] 07/27/2015  2:30 PM

## 2015-08-04 NOTE — Patient Instructions (Addendum)
Terry Martinez  08/04/2015   Your procedure is scheduled on: 08/11/2015   Report to [facility] Main  Entrance take [place]  elevators to 3rd floor to  [place] at   [place] AM.  Call this number if you have problems the morning of surgery [phone]   Remember: ONLY 1 PERSON MAY GO WITH YOU TO SHORT STAY TO GET  READY MORNING OF [name].  Do not eat food or drink liquids :After Midnight.     Take these medicines the morning of surgery with A SIP OF WATER: Albuterol Inhaler if needed and bring, metoprolol ( Lopressor), Flomax                                 You may not have any metal on your body including hair pins and              piercings  Do not wear jewelry,  lotions, powders or perfumes, deodorant                          Men may shave face and neck.   Do not bring valuables to the hospital. [place].  Contacts, dentures or bridgework may not be worn into surgery.  Leave suitcase in the car. After surgery it may be brought to your room.               Please read over the following fact sheets you were given: _____________________________________________________________________             [facility] - Preparing for Surgery Before surgery, you can play an important role.  Because skin is not sterile, your skin needs to be as free of germs as possible.  You can reduce the number of germs on your skin by washing with CHG (chlorahexidine gluconate) soap before surgery.  CHG is an antiseptic cleaner which kills germs and bonds with the skin to continue killing germs even after washing. Please DO NOT use if you have an allergy to CHG or antibacterial soaps.  If your skin becomes reddened/irritated stop using the CHG and inform your nurse when you arrive at Short Stay. Do not shave (including legs and underarms) for at least 48 hours prior to the first CHG shower.  You may shave your  face/neck. Please follow these instructions carefully:  1.  Shower with CHG Soap the night before surgery and the  morning of Surgery.  2.  If you choose to wash your hair, wash your hair first as usual with your  normal  shampoo.  3.  After you shampoo, rinse your hair and body thoroughly to remove the  shampoo.                           4.  Use CHG as you would any other liquid soap.  You can apply chg directly  to the skin and wash                       Gently with a scrungie or clean washcloth.  5.  Apply the CHG Soap to your body ONLY FROM THE  NECK DOWN.   Do not use on face/ open                           Wound or open sores. Avoid contact with eyes, ears mouth and genitals (private parts).                       Wash face,  Genitals (private parts) with your normal soap.             6.  Wash thoroughly, paying special attention to the area where your surgery  will be performed.  7.  Thoroughly rinse your body with warm water from the neck down.  8.  DO NOT shower/wash with your normal soap after using and rinsing off  the CHG Soap.                9.  Pat yourself dry with a clean towel.            10.  Wear clean pajamas.            11.  Place clean sheets on your bed the night of your first shower and do not  sleep with pets. Day of Surgery : Do not apply any lotions/deodorants the morning of surgery.  Please wear clean clothes to the hospital/surgery center.  FAILURE TO FOLLOW THESE INSTRUCTIONS MAY RESULT IN THE CANCELLATION OF YOUR SURGERY PATIENT SIGNATURE_________________________________  NURSE SIGNATURE__________________________________  ________________________________________________________________________

## 2015-08-06 ENCOUNTER — Encounter (HOSPITAL_COMMUNITY): Payer: Self-pay

## 2015-08-06 ENCOUNTER — Encounter (HOSPITAL_COMMUNITY)
Admission: RE | Admit: 2015-08-06 | Discharge: 2015-08-06 | Disposition: A | Discharge status: Home / Self Care | Payer: Medicare Other | Source: Ambulatory Visit | Attending: Surgery | Admitting: Surgery

## 2015-08-06 DIAGNOSIS — K432 Incisional hernia without obstruction or gangrene: Secondary | ICD-10-CM | POA: Insufficient documentation

## 2015-08-06 DIAGNOSIS — Z01812 Encounter for preprocedural laboratory examination: Secondary | ICD-10-CM | POA: Diagnosis not present

## 2015-08-06 LAB — CBC
HEMATOCRIT: 40.3 % (ref 39.0–52.0)
HEMOGLOBIN: 13.1 g/dL (ref 13.0–17.0)
MCH: 31.4 pg (ref 26.0–34.0)
MCHC: 32.5 g/dL (ref 30.0–36.0)
MCV: 96.6 fL (ref 78.0–100.0)
Platelets: 159 10*3/uL (ref 150–400)
RBC: 4.17 MIL/uL — AB (ref 4.22–5.81)
RDW: 15.4 % (ref 11.5–15.5)
WBC: 7.1 10*3/uL (ref 4.0–10.5)

## 2015-08-06 LAB — BASIC METABOLIC PANEL
ANION GAP: 6 (ref 5–15)
BUN: 28 mg/dL — ABNORMAL HIGH (ref 6–20)
CALCIUM: 9.1 mg/dL (ref 8.9–10.3)
CO2: 28 mmol/L (ref 22–32)
Chloride: 106 mmol/L (ref 101–111)
Creatinine, Ser: 1.6 mg/dL — ABNORMAL HIGH (ref 0.61–1.24)
GFR, EST AFRICAN AMERICAN: 47 mL/min — AB (ref >60)
GFR, EST NON AFRICAN AMERICAN: 41 mL/min — AB (ref >60)
Glucose, Bld: 103 mg/dL — ABNORMAL HIGH (ref 65–99)
POTASSIUM: 4.8 mmol/L (ref 3.5–5.1)
SODIUM: 140 mmol/L (ref 135–145)

## 2015-08-06 NOTE — Progress Notes (Signed)
   08/06/15 1029  OBSTRUCTIVE SLEEP APNEA  Have you ever been diagnosed with sleep apnea through a sleep study? No  Do you snore loudly (loud enough to be heard through closed doors)?  1  Do you often feel tired, fatigued, or sleepy during the daytime (such as falling asleep during driving or talking to someone)? 0  Has anyone observed you stop breathing during your sleep? 0  Do you have, or are you being treated for high blood pressure? 1  BMI more than 35 kg/m2? 1  Age > 50 (1-yes) 1  Neck circumference greater than:Male 16 inches or larger, Male 17inches or larger? 1  Male Gender (Yes=1) 1  Obstructive Sleep Apnea Score 6

## 2015-08-06 NOTE — Pre-Procedure Instructions (Addendum)
EKG 06-07-15 epic Echo 01/2014 epic Cardiac clearance and LOV 06/07/15 epic  Pt on coumadin and states he will stop taking tonight (08/06/15).  Will order PT for day of surgery.

## 2015-08-09 DIAGNOSIS — I70235 Atherosclerosis of native arteries of right leg with ulceration of other part of foot: Secondary | ICD-10-CM | POA: Diagnosis not present

## 2015-08-09 DIAGNOSIS — B351 Tinea unguium: Secondary | ICD-10-CM | POA: Diagnosis not present

## 2015-08-09 DIAGNOSIS — L97411 Non-pressure chronic ulcer of right heel and midfoot limited to breakdown of skin: Secondary | ICD-10-CM | POA: Diagnosis not present

## 2015-08-10 MED ORDER — GENTAMICIN SULFATE 40 MG/ML IJ SOLN
500.0000 mg | INTRAVENOUS | Status: DC
  Filled 2015-08-10: qty 12.5

## 2015-08-10 MED ORDER — DEXTROSE 5 % IV SOLN
3.0000 g | INTRAVENOUS | Status: AC
  Administered 2015-08-11: 3 g via INTRAVENOUS
  Filled 2015-08-10: qty 3

## 2015-08-10 MED ORDER — GABAPENTIN 300 MG PO CAPS
300.0000 mg | ORAL_CAPSULE | ORAL | Status: AC
  Administered 2015-08-11: 300 mg via ORAL
  Filled 2015-08-10: qty 1

## 2015-08-11 ENCOUNTER — Observation Stay (HOSPITAL_COMMUNITY)
Admission: RE | Admit: 2015-08-11 | Discharge: 2015-08-13 | Disposition: A | Discharge status: Home / Self Care | Payer: Medicare Other | Source: Ambulatory Visit | Attending: Surgery | Admitting: Surgery

## 2015-08-11 ENCOUNTER — Encounter (HOSPITAL_COMMUNITY)
Admission: RE | Disposition: A | Discharge status: Home / Self Care | Payer: Self-pay | Source: Ambulatory Visit | Attending: Surgery

## 2015-08-11 ENCOUNTER — Ambulatory Visit (HOSPITAL_COMMUNITY): Payer: Medicare Other | Admitting: Anesthesiology

## 2015-08-11 ENCOUNTER — Encounter (HOSPITAL_COMMUNITY): Payer: Self-pay | Admitting: Anesthesiology

## 2015-08-11 DIAGNOSIS — Z89421 Acquired absence of other right toe(s): Secondary | ICD-10-CM | POA: Diagnosis not present

## 2015-08-11 DIAGNOSIS — I4891 Unspecified atrial fibrillation: Secondary | ICD-10-CM | POA: Insufficient documentation

## 2015-08-11 DIAGNOSIS — Z86711 Personal history of pulmonary embolism: Secondary | ICD-10-CM | POA: Diagnosis not present

## 2015-08-11 DIAGNOSIS — M109 Gout, unspecified: Secondary | ICD-10-CM | POA: Insufficient documentation

## 2015-08-11 DIAGNOSIS — I129 Hypertensive chronic kidney disease with stage 1 through stage 4 chronic kidney disease, or unspecified chronic kidney disease: Secondary | ICD-10-CM | POA: Diagnosis not present

## 2015-08-11 DIAGNOSIS — Z6841 Body Mass Index (BMI) 40.0 and over, adult: Secondary | ICD-10-CM | POA: Diagnosis not present

## 2015-08-11 DIAGNOSIS — Z7901 Long term (current) use of anticoagulants: Secondary | ICD-10-CM | POA: Diagnosis not present

## 2015-08-11 DIAGNOSIS — G629 Polyneuropathy, unspecified: Secondary | ICD-10-CM | POA: Diagnosis not present

## 2015-08-11 DIAGNOSIS — Z95820 Peripheral vascular angioplasty status with implants and grafts: Secondary | ICD-10-CM | POA: Insufficient documentation

## 2015-08-11 DIAGNOSIS — J449 Chronic obstructive pulmonary disease, unspecified: Secondary | ICD-10-CM | POA: Diagnosis not present

## 2015-08-11 DIAGNOSIS — Z87891 Personal history of nicotine dependence: Secondary | ICD-10-CM | POA: Insufficient documentation

## 2015-08-11 DIAGNOSIS — Z85828 Personal history of other malignant neoplasm of skin: Secondary | ICD-10-CM | POA: Diagnosis not present

## 2015-08-11 DIAGNOSIS — Z79899 Other long term (current) drug therapy: Secondary | ICD-10-CM | POA: Insufficient documentation

## 2015-08-11 DIAGNOSIS — I739 Peripheral vascular disease, unspecified: Secondary | ICD-10-CM | POA: Insufficient documentation

## 2015-08-11 DIAGNOSIS — E785 Hyperlipidemia, unspecified: Secondary | ICD-10-CM | POA: Diagnosis not present

## 2015-08-11 DIAGNOSIS — Z89411 Acquired absence of right great toe: Secondary | ICD-10-CM | POA: Diagnosis not present

## 2015-08-11 DIAGNOSIS — I252 Old myocardial infarction: Secondary | ICD-10-CM | POA: Insufficient documentation

## 2015-08-11 DIAGNOSIS — E669 Obesity, unspecified: Secondary | ICD-10-CM | POA: Insufficient documentation

## 2015-08-11 DIAGNOSIS — Z959 Presence of cardiac and vascular implant and graft, unspecified: Secondary | ICD-10-CM | POA: Diagnosis not present

## 2015-08-11 DIAGNOSIS — I700 Atherosclerosis of aorta: Secondary | ICD-10-CM | POA: Insufficient documentation

## 2015-08-11 DIAGNOSIS — Z955 Presence of coronary angioplasty implant and graft: Secondary | ICD-10-CM | POA: Diagnosis not present

## 2015-08-11 DIAGNOSIS — K660 Peritoneal adhesions (postprocedural) (postinfection): Secondary | ICD-10-CM | POA: Diagnosis not present

## 2015-08-11 DIAGNOSIS — I10 Essential (primary) hypertension: Secondary | ICD-10-CM | POA: Insufficient documentation

## 2015-08-11 DIAGNOSIS — K430 Incisional hernia with obstruction, without gangrene: Principal | ICD-10-CM | POA: Insufficient documentation

## 2015-08-11 DIAGNOSIS — K432 Incisional hernia without obstruction or gangrene: Secondary | ICD-10-CM | POA: Diagnosis not present

## 2015-08-11 DIAGNOSIS — Z95828 Presence of other vascular implants and grafts: Secondary | ICD-10-CM

## 2015-08-11 DIAGNOSIS — Z86718 Personal history of other venous thrombosis and embolism: Secondary | ICD-10-CM | POA: Diagnosis not present

## 2015-08-11 DIAGNOSIS — N189 Chronic kidney disease, unspecified: Secondary | ICD-10-CM | POA: Diagnosis not present

## 2015-08-11 DIAGNOSIS — K439 Ventral hernia without obstruction or gangrene: Secondary | ICD-10-CM

## 2015-08-11 HISTORY — PX: LAPAROSCOPIC ASSISTED VENTRAL HERNIA REPAIR: SHX6312

## 2015-08-11 HISTORY — PX: LAPAROSCOPIC LYSIS OF ADHESIONS: SHX5905

## 2015-08-11 LAB — PROTIME-INR
INR: 1.21 (ref 0.00–1.49)
Prothrombin Time: 15.4 seconds — ABNORMAL HIGH (ref 11.6–15.2)

## 2015-08-11 SURGERY — LYSIS, ADHESIONS, LAPAROSCOPIC
Anesthesia: General

## 2015-08-11 MED ORDER — EPHEDRINE SULFATE 50 MG/ML IJ SOLN
INTRAMUSCULAR | Status: DC | PRN
  Administered 2015-08-11 (×3): 10 mg via INTRAVENOUS

## 2015-08-11 MED ORDER — BUPIVACAINE-EPINEPHRINE 0.25% -1:200000 IJ SOLN
INTRAMUSCULAR | Status: AC
  Filled 2015-08-11: qty 1

## 2015-08-11 MED ORDER — SODIUM CHLORIDE 0.9% FLUSH: 3.0000 mL | Freq: Two times a day (BID) | INTRAVENOUS | Status: DC

## 2015-08-11 MED ORDER — ONDANSETRON HCL 4 MG/2ML IJ SOLN
INTRAMUSCULAR | Status: AC
  Filled 2015-08-11: qty 2

## 2015-08-11 MED ORDER — LIDOCAINE HCL (CARDIAC) 20 MG/ML IV SOLN
INTRAVENOUS | Status: DC | PRN
  Administered 2015-08-11: 40 mg via INTRAVENOUS

## 2015-08-11 MED ORDER — FUROSEMIDE 40 MG PO TABS
40.0000 mg | ORAL_TABLET | Freq: Every day | ORAL | Status: DC
  Administered 2015-08-11 – 2015-08-12 (×2): 40 mg via ORAL
  Filled 2015-08-11 (×2): qty 1

## 2015-08-11 MED ORDER — METHOCARBAMOL 750 MG PO TABS: 750.0000 mg | ORAL_TABLET | Freq: Four times a day (QID) | ORAL | Status: DC | PRN

## 2015-08-11 MED ORDER — SODIUM CHLORIDE 0.9 % IJ SOLN
INTRAMUSCULAR | Status: AC
  Filled 2015-08-11: qty 10

## 2015-08-11 MED ORDER — VITAMIN C 500 MG PO TABS
500.0000 mg | ORAL_TABLET | Freq: Every day | ORAL | Status: DC
  Administered 2015-08-11 – 2015-08-12 (×2): 500 mg via ORAL
  Filled 2015-08-11 (×4): qty 1

## 2015-08-11 MED ORDER — BUPIVACAINE LIPOSOME 1.3 % IJ SUSP
20.0000 mL | INTRAMUSCULAR | Status: DC
  Filled 2015-08-11: qty 20

## 2015-08-11 MED ORDER — SODIUM CHLORIDE 0.9 % IJ SOLN
INTRAMUSCULAR | Status: AC
  Filled 2015-08-11: qty 50

## 2015-08-11 MED ORDER — BISACODYL 10 MG RE SUPP
10.0000 mg | Freq: Two times a day (BID) | RECTAL | Status: DC | PRN
  Administered 2015-08-12: 10 mg via RECTAL
  Filled 2015-08-11: qty 1

## 2015-08-11 MED ORDER — ACETAMINOPHEN 10 MG/ML IV SOLN
INTRAVENOUS | Status: DC | PRN
  Administered 2015-08-11: 1000 mg via INTRAVENOUS

## 2015-08-11 MED ORDER — CEFAZOLIN SODIUM 10 G IJ SOLR
3.0000 g | Freq: Three times a day (TID) | INTRAMUSCULAR | Status: AC
  Administered 2015-08-11 (×2): 3 g via INTRAVENOUS
  Filled 2015-08-11 (×2): qty 3000
  Filled 2015-08-11 (×2): qty 3

## 2015-08-11 MED ORDER — NITROGLYCERIN 0.4 MG SL SUBL: 0.4000 mg | SUBLINGUAL_TABLET | SUBLINGUAL | Status: DC | PRN

## 2015-08-11 MED ORDER — SIMETHICONE 80 MG PO CHEW: 40.0000 mg | CHEWABLE_TABLET | Freq: Four times a day (QID) | ORAL | Status: DC | PRN

## 2015-08-11 MED ORDER — OXYCODONE HCL 5 MG PO TABS
5.0000 mg | ORAL_TABLET | ORAL | Status: DC | PRN
Start: 2015-08-11 — End: 2015-12-06

## 2015-08-11 MED ORDER — BUPIVACAINE-EPINEPHRINE 0.25% -1:200000 IJ SOLN
INTRAMUSCULAR | Status: DC | PRN
  Administered 2015-08-11: 100 mL

## 2015-08-11 MED ORDER — HYDROMORPHONE HCL 1 MG/ML IJ SOLN
INTRAMUSCULAR | Status: DC | PRN
  Administered 2015-08-11 (×5): .4 mg via INTRAVENOUS

## 2015-08-11 MED ORDER — SODIUM CHLORIDE 0.9% FLUSH: 3.0000 mL | INTRAVENOUS | Status: DC | PRN

## 2015-08-11 MED ORDER — ALLOPURINOL 300 MG PO TABS
300.0000 mg | ORAL_TABLET | Freq: Every day | ORAL | Status: DC
  Administered 2015-08-12: 300 mg via ORAL
  Filled 2015-08-11 (×2): qty 1

## 2015-08-11 MED ORDER — PHENOL 1.4 % MT LIQD
2.0000 | OROMUCOSAL | Status: DC | PRN
  Filled 2015-08-11: qty 177

## 2015-08-11 MED ORDER — HYDRALAZINE HCL 20 MG/ML IJ SOLN: 10.0000 mg | INTRAMUSCULAR | Status: DC | PRN

## 2015-08-11 MED ORDER — SODIUM CHLORIDE 0.9 % IV SOLN: 250.0000 mL | INTRAVENOUS | Status: DC | PRN

## 2015-08-11 MED ORDER — DEXAMETHASONE SODIUM PHOSPHATE 10 MG/ML IJ SOLN
INTRAMUSCULAR | Status: AC
  Filled 2015-08-11: qty 1

## 2015-08-11 MED ORDER — STERILE WATER FOR IRRIGATION IR SOLN
Status: DC | PRN
  Administered 2015-08-11: 1000 mL

## 2015-08-11 MED ORDER — LACTATED RINGERS IV SOLN
INTRAVENOUS | Status: DC
  Administered 2015-08-11: 16:00:00 via INTRAVENOUS

## 2015-08-11 MED ORDER — DIPHENHYDRAMINE HCL 50 MG/ML IJ SOLN: 12.5000 mg | Freq: Four times a day (QID) | INTRAMUSCULAR | Status: DC | PRN

## 2015-08-11 MED ORDER — PROMETHAZINE HCL 25 MG/ML IJ SOLN: 6.2500 mg | INTRAMUSCULAR | Status: DC | PRN

## 2015-08-11 MED ORDER — SUGAMMADEX SODIUM 200 MG/2ML IV SOLN
INTRAVENOUS | Status: DC | PRN
  Administered 2015-08-11: 300 mg via INTRAVENOUS

## 2015-08-11 MED ORDER — METOPROLOL TARTRATE 12.5 MG HALF TABLET
12.5000 mg | ORAL_TABLET | Freq: Two times a day (BID) | ORAL | Status: DC
  Administered 2015-08-11 – 2015-08-12 (×3): 12.5 mg via ORAL
  Filled 2015-08-11 (×3): qty 1

## 2015-08-11 MED ORDER — POTASSIUM CHLORIDE CRYS ER 20 MEQ PO TBCR
20.0000 meq | EXTENDED_RELEASE_TABLET | Freq: Every day | ORAL | Status: DC
  Administered 2015-08-11 – 2015-08-12 (×2): 20 meq via ORAL
  Filled 2015-08-11 (×2): qty 1

## 2015-08-11 MED ORDER — LIP MEDEX EX OINT
1.0000 "application " | TOPICAL_OINTMENT | Freq: Two times a day (BID) | CUTANEOUS | Status: DC
  Administered 2015-08-11 – 2015-08-12 (×3): 1 via TOPICAL
  Filled 2015-08-11: qty 7

## 2015-08-11 MED ORDER — TAMSULOSIN HCL 0.4 MG PO CAPS
0.4000 mg | ORAL_CAPSULE | Freq: Every day | ORAL | Status: DC
  Administered 2015-08-11 – 2015-08-12 (×2): 0.4 mg via ORAL
  Filled 2015-08-11 (×2): qty 1

## 2015-08-11 MED ORDER — MAGIC MOUTHWASH
15.0000 mL | Freq: Four times a day (QID) | ORAL | Status: DC | PRN
  Filled 2015-08-11: qty 15

## 2015-08-11 MED ORDER — PROPOFOL 10 MG/ML IV BOLUS
INTRAVENOUS | Status: AC
  Filled 2015-08-11: qty 20

## 2015-08-11 MED ORDER — PROCHLORPERAZINE EDISYLATE 5 MG/ML IJ SOLN: 5.0000 mg | INTRAMUSCULAR | Status: DC | PRN

## 2015-08-11 MED ORDER — GENTAMICIN SULFATE 40 MG/ML IJ SOLN
500.0000 mg | INTRAVENOUS | Status: AC
  Administered 2015-08-11: 500 mg via INTRAVENOUS
  Filled 2015-08-11: qty 12.5

## 2015-08-11 MED ORDER — OXYCODONE HCL 5 MG PO TABS
5.0000 mg | ORAL_TABLET | ORAL | Status: DC | PRN
  Administered 2015-08-11 (×2): 5 mg via ORAL
  Administered 2015-08-12 – 2015-08-13 (×4): 10 mg via ORAL
  Filled 2015-08-11 (×3): qty 2
  Filled 2015-08-11 (×2): qty 1
  Filled 2015-08-11: qty 2

## 2015-08-11 MED ORDER — BUPIVACAINE LIPOSOME 1.3 % IJ SUSP
20.0000 mL | Freq: Once | INTRAMUSCULAR | Status: AC
  Administered 2015-08-11: 20 mL
  Filled 2015-08-11: qty 20

## 2015-08-11 MED ORDER — SUFENTANIL CITRATE 50 MCG/ML IV SOLN
INTRAVENOUS | Status: DC | PRN
  Administered 2015-08-11: 10 ug via INTRAVENOUS
  Administered 2015-08-11: 20 ug via INTRAVENOUS
  Administered 2015-08-11: 10 ug via INTRAVENOUS

## 2015-08-11 MED ORDER — MENTHOL 3 MG MT LOZG: 1.0000 | LOZENGE | OROMUCOSAL | Status: DC | PRN

## 2015-08-11 MED ORDER — METHOCARBAMOL 500 MG PO TABS
1000.0000 mg | ORAL_TABLET | Freq: Four times a day (QID) | ORAL | Status: DC | PRN
  Administered 2015-08-12: 1000 mg via ORAL
  Filled 2015-08-11 (×2): qty 2

## 2015-08-11 MED ORDER — 0.9 % SODIUM CHLORIDE (POUR BTL) OPTIME
TOPICAL | Status: DC | PRN
  Administered 2015-08-11: 1000 mL

## 2015-08-11 MED ORDER — HYDROMORPHONE HCL 1 MG/ML IJ SOLN: 0.2500 mg | INTRAMUSCULAR | Status: DC | PRN

## 2015-08-11 MED ORDER — ENALAPRIL MALEATE 10 MG PO TABS
10.0000 mg | ORAL_TABLET | Freq: Every day | ORAL | Status: DC
  Administered 2015-08-12: 10 mg via ORAL
  Filled 2015-08-11 (×2): qty 1

## 2015-08-11 MED ORDER — LACTATED RINGERS IV SOLN
INTRAVENOUS | Status: DC | PRN
  Administered 2015-08-11 (×2): via INTRAVENOUS

## 2015-08-11 MED ORDER — SUCCINYLCHOLINE CHLORIDE 20 MG/ML IJ SOLN
INTRAMUSCULAR | Status: DC | PRN
  Administered 2015-08-11: 100 mg via INTRAVENOUS

## 2015-08-11 MED ORDER — ACETAMINOPHEN 325 MG PO TABS
650.0000 mg | ORAL_TABLET | ORAL | Status: AC
  Administered 2015-08-11: 650 mg via ORAL
  Filled 2015-08-11: qty 2

## 2015-08-11 MED ORDER — ENOXAPARIN SODIUM 40 MG/0.4ML ~~LOC~~ SOLN
40.0000 mg | SUBCUTANEOUS | Status: DC
  Administered 2015-08-12 – 2015-08-13 (×2): 40 mg via SUBCUTANEOUS
  Filled 2015-08-11 (×2): qty 0.4

## 2015-08-11 MED ORDER — ALBUTEROL SULFATE (2.5 MG/3ML) 0.083% IN NEBU: 3.0000 mL | INHALATION_SOLUTION | Freq: Four times a day (QID) | RESPIRATORY_TRACT | Status: DC | PRN

## 2015-08-11 MED ORDER — LIDOCAINE HCL (CARDIAC) 20 MG/ML IV SOLN
INTRAVENOUS | Status: AC
  Filled 2015-08-11: qty 5

## 2015-08-11 MED ORDER — ROCURONIUM BROMIDE 100 MG/10ML IV SOLN
INTRAVENOUS | Status: DC | PRN
  Administered 2015-08-11: 5 mg via INTRAVENOUS
  Administered 2015-08-11: 50 mg via INTRAVENOUS
  Administered 2015-08-11: 20 mg via INTRAVENOUS
  Administered 2015-08-11: 10 mg via INTRAVENOUS
  Administered 2015-08-11: 5 mg via INTRAVENOUS

## 2015-08-11 MED ORDER — ACETAMINOPHEN 10 MG/ML IV SOLN
INTRAVENOUS | Status: AC
  Filled 2015-08-11: qty 100

## 2015-08-11 MED ORDER — HYDROMORPHONE HCL 2 MG/ML IJ SOLN
INTRAMUSCULAR | Status: AC
  Filled 2015-08-11: qty 1

## 2015-08-11 MED ORDER — ALLOPURINOL 100 MG PO TABS
100.0000 mg | ORAL_TABLET | Freq: Every evening | ORAL | Status: DC
  Administered 2015-08-11 – 2015-08-12 (×2): 100 mg via ORAL
  Filled 2015-08-11 (×3): qty 1

## 2015-08-11 MED ORDER — SUGAMMADEX SODIUM 500 MG/5ML IV SOLN
INTRAVENOUS | Status: AC
  Filled 2015-08-11: qty 5

## 2015-08-11 MED ORDER — SUFENTANIL CITRATE 50 MCG/ML IV SOLN
INTRAVENOUS | Status: AC
  Filled 2015-08-11: qty 1

## 2015-08-11 MED ORDER — LACTATED RINGERS IV BOLUS (SEPSIS): 1000.0000 mL | Freq: Three times a day (TID) | INTRAVENOUS | Status: DC | PRN

## 2015-08-11 MED ORDER — DIPHENHYDRAMINE HCL 12.5 MG/5ML PO ELIX: 12.5000 mg | ORAL_SOLUTION | Freq: Four times a day (QID) | ORAL | Status: DC | PRN

## 2015-08-11 MED ORDER — DEXAMETHASONE SODIUM PHOSPHATE 10 MG/ML IJ SOLN
INTRAMUSCULAR | Status: DC | PRN
  Administered 2015-08-11: 10 mg via INTRAVENOUS

## 2015-08-11 MED ORDER — ACETAMINOPHEN 500 MG PO TABS
1000.0000 mg | ORAL_TABLET | Freq: Four times a day (QID) | ORAL | Status: DC
  Administered 2015-08-11 – 2015-08-13 (×6): 1000 mg via ORAL
  Filled 2015-08-11 (×6): qty 2

## 2015-08-11 MED ORDER — PROPOFOL 10 MG/ML IV BOLUS
INTRAVENOUS | Status: DC | PRN
  Administered 2015-08-11: 120 mg via INTRAVENOUS

## 2015-08-11 MED ORDER — ONDANSETRON 4 MG PO TBDP: 4.0000 mg | ORAL_TABLET | Freq: Four times a day (QID) | ORAL | Status: DC | PRN

## 2015-08-11 MED ORDER — HYDROMORPHONE HCL 1 MG/ML IJ SOLN: 0.5000 mg | INTRAMUSCULAR | Status: DC | PRN

## 2015-08-11 MED ORDER — EPHEDRINE SULFATE 50 MG/ML IJ SOLN
INTRAMUSCULAR | Status: AC
  Filled 2015-08-11: qty 1

## 2015-08-11 MED ORDER — ONDANSETRON HCL 4 MG/2ML IJ SOLN: 4.0000 mg | Freq: Four times a day (QID) | INTRAMUSCULAR | Status: DC | PRN

## 2015-08-11 MED ORDER — METOPROLOL TARTRATE 5 MG/5ML IV SOLN: 5.0000 mg | Freq: Four times a day (QID) | INTRAVENOUS | Status: DC | PRN

## 2015-08-11 MED ORDER — ROCURONIUM BROMIDE 100 MG/10ML IV SOLN
INTRAVENOUS | Status: AC
  Filled 2015-08-11: qty 1

## 2015-08-11 MED ORDER — ONDANSETRON HCL 4 MG/2ML IJ SOLN
INTRAMUSCULAR | Status: DC | PRN
  Administered 2015-08-11: 4 mg via INTRAVENOUS

## 2015-08-11 MED ORDER — METHOCARBAMOL 1000 MG/10ML IJ SOLN
1000.0000 mg | Freq: Four times a day (QID) | INTRAVENOUS | Status: DC | PRN
  Filled 2015-08-11: qty 10

## 2015-08-11 MED ORDER — EPINEPHRINE 0.3 MG/0.3ML IJ SOAJ
0.3000 mg | Freq: Once | INTRAMUSCULAR | Status: DC | PRN
  Filled 2015-08-11: qty 0.3

## 2015-08-11 SURGICAL SUPPLY — 46 items
APPLIER CLIP 5 13 M/L LIGAMAX5 (MISCELLANEOUS)
BINDER ABDOMINAL 12 ML 46-62 (SOFTGOODS) ×3 IMPLANT
CABLE HIGH FREQUENCY MONO STRZ (ELECTRODE) ×3 IMPLANT
CHLORAPREP W/TINT 26ML (MISCELLANEOUS) ×3 IMPLANT
CLIP APPLIE 5 13 M/L LIGAMAX5 (MISCELLANEOUS) IMPLANT
CLOSURE WOUND 1/2 X4 (GAUZE/BANDAGES/DRESSINGS) ×2
COVER SURGICAL LIGHT HANDLE (MISCELLANEOUS) ×3 IMPLANT
DECANTER SPIKE VIAL GLASS SM (MISCELLANEOUS) ×3 IMPLANT
DEVICE SECURE STRAP 25 ABSORB (INSTRUMENTS) IMPLANT
DEVICE TROCAR PUNCTURE CLOSURE (ENDOMECHANICALS) ×3 IMPLANT
DRAIN CHANNEL 15F RND FF 3/16 (WOUND CARE) ×3 IMPLANT
DRAPE LAPAROSCOPIC ABDOMINAL (DRAPES) ×3 IMPLANT
DRAPE WARM FLUID 44X44 (DRAPE) ×3 IMPLANT
DRSG OPSITE POSTOP 4X12 (GAUZE/BANDAGES/DRESSINGS) ×3 IMPLANT
DRSG TEGADERM 2-3/8X2-3/4 SM (GAUZE/BANDAGES/DRESSINGS) ×9 IMPLANT
DRSG TEGADERM 4X4.75 (GAUZE/BANDAGES/DRESSINGS) ×3 IMPLANT
ELECT PENCIL ROCKER SW 15FT (MISCELLANEOUS) ×3 IMPLANT
ELECT REM PT RETURN 9FT ADLT (ELECTROSURGICAL) ×3
ELECTRODE REM PT RTRN 9FT ADLT (ELECTROSURGICAL) ×1 IMPLANT
EVACUATOR DRAINAGE 10X20 100CC (DRAIN) ×1 IMPLANT
EVACUATOR SILICONE 100CC (DRAIN) ×2
GAUZE SPONGE 2X2 8PLY STRL LF (GAUZE/BANDAGES/DRESSINGS) IMPLANT
GLOVE ECLIPSE 8.0 STRL XLNG CF (GLOVE) ×3 IMPLANT
GLOVE INDICATOR 8.0 STRL GRN (GLOVE) ×3 IMPLANT
GOWN STRL REUS W/TWL XL LVL3 (GOWN DISPOSABLE) ×9 IMPLANT
KIT BASIN OR (CUSTOM PROCEDURE TRAY) ×3 IMPLANT
MARKER SKIN DUAL TIP RULER LAB (MISCELLANEOUS) ×3 IMPLANT
MESH VENTRALIGHT ST 10X13IN (Mesh General) ×3 IMPLANT
NEEDLE SPNL 22GX3.5 QUINCKE BK (NEEDLE) IMPLANT
PAD POSITIONING PINK XL (MISCELLANEOUS) ×3 IMPLANT
SCISSORS LAP 5X35 DISP (ENDOMECHANICALS) ×3 IMPLANT
SET IRRIG TUBING LAPAROSCOPIC (IRRIGATION / IRRIGATOR) ×3 IMPLANT
SHEARS HARMONIC ACE PLUS 36CM (ENDOMECHANICALS) IMPLANT
SLEEVE XCEL OPT CAN 5 100 (ENDOMECHANICALS) ×9 IMPLANT
SPONGE GAUZE 2X2 STER 10/PKG (GAUZE/BANDAGES/DRESSINGS)
STRIP CLOSURE SKIN 1/2X4 (GAUZE/BANDAGES/DRESSINGS) ×4 IMPLANT
SUT MNCRL AB 4-0 PS2 18 (SUTURE) ×6 IMPLANT
SUT PDS AB 1 CT1 27 (SUTURE) ×6 IMPLANT
SUT PROLENE 1 CT 1 30 (SUTURE) ×36 IMPLANT
SUT PROLENE 2 0 BLUE (SUTURE) ×3 IMPLANT
SUT VIC AB 3-0 SH 18 (SUTURE) ×3 IMPLANT
TOWEL OR 17X26 10 PK STRL BLUE (TOWEL DISPOSABLE) ×3 IMPLANT
TRAY LAPAROSCOPIC (CUSTOM PROCEDURE TRAY) ×3 IMPLANT
TROCAR BLADELESS OPT 5 100 (ENDOMECHANICALS) ×3 IMPLANT
TROCAR XCEL NON-BLD 11X100MML (ENDOMECHANICALS) IMPLANT
TUBING INSUF HEATED (TUBING) ×3 IMPLANT

## 2015-08-11 NOTE — Op Note (Signed)
08/11/2015  1:22 PM  PATIENT:  Terry Martinez  75 y.o. male  Patient Care Team: [name], MD as PCP - General (Family Medicine) Newt Minion, MD as Consulting Physician (Orthopedic Surgery) Fay Records, MD as Consulting Physician (Cardiology) Michael Boston, MD as Consulting Physician (General Surgery) Angelia Mould, MD as Consulting Physician (Vascular Surgery)  PRE-OPERATIVE DIAGNOSIS:  Incisional wall hernias   POST-OPERATIVE DIAGNOSIS:  incisional wall hernias  PROCEDURE:    LAPAROSCOPIC LYSIS OF ADHESIONS LAPAROSCOPIC ASSISTED [name] with mesh  SURGEON:  Surgeon(s): Michael Boston, MD  ASSISTANT: Kennyth Lose, PA Student   ANESTHESIA:     General Local anesthesia field block: (0.25% bupivacaine & liposomal  Bupivacaine [Experel])   EBL:  Total I/O In: 1762.5 [I.V.:1600; IV Piggyback:162.5] Out: 250 [Urine:150; Blood:100]  Delay start of Pharmacological VTE agent (>24hrs) due to surgical blood loss or risk of bleeding:  no  DRAINS: (15Fr) Blake drain(s) in the SQ   SPECIMEN:  No Specimen  DISPOSITION OF SPECIMEN:  N/A  COUNTS:  YES  PLAN OF CARE: Admit for overnight observation  PATIENT DISPOSITION:  PACU - hemodynamically stable.  INDICATION: Pleasant patient has developed a ventral wall abdominal hernia.   Recommendation was made for surgical repair:  The anatomy & physiology of the abdominal wall was discussed. The pathophysiology of hernias was discussed. Natural history risks without surgery including progeressive enlargement, pain, incarceration & strangulation was discussed. Contributors to complications such as smoking, obesity, diabetes, prior surgery, etc were discussed.  I feel the risks of no intervention will lead to serious problems that outweigh the operative risks; therefore, I recommended surgery to reduce and repair the hernia. I explained laparoscopic techniques with possible need for an open approach. I noted  the probable use of mesh to patch and/or buttress the hernia repair  Risks such as bleeding, infection, abscess, need for further treatment, heart attack, death, and other risks were discussed. I noted a good likelihood this will help address the problem. Goals of post-operative recovery were discussed as well. Possibility that this will not correct all symptoms was explained. I stressed the importance of low-impact activity, aggressive pain control, avoiding constipation, & not pushing through pain to minimize risk of post-operative chronic pain or injury. Possibility of reherniation especially with smoking, obesity, diabetes, immunosuppression, and other health conditions was discussed. We will work to minimize complications.  An educational handout further explaining the pathology & treatment options was given as well. Questions were answered. The patient expresses understanding & wishes to proceed with surgery.   OR FINDINGS: 18x6cm region of Swiss cheese hernias, Largest 8x5cm   Type of repair - Laparoscopic underlay repair   Name of mesh - Bard Ventralight dual sided (polypropylene / Seprafilm)  Size of mesh - Height 33 cm, Width 27 cm  Orientation:  Vertical   Mesh overlap - 6-9 cm  Placement of mesh - Intraperitoneal underlay repair   DESCRIPTION:   Informed consent was confirmed. The patient underwent general anaesthesia without difficulty. The patient was positioned appropriately. VTE prevention in place. The patient's abdomen was clipped, prepped, & draped in a sterile fashion.  Left aortobifemoral past graft noted in left midaxillary line in suprapubic region over the pubis.  Marked and stayed away from.  Surgical timeout confirmed our plan. The patient was positioned in reverse Trendelenburg. Abdominal entry was gained using optical entry technique in the left upper abdomen. Entry was clean. I induced carbon dioxide insufflation. Camera inspection revealed no injury. Extra  ports  were carefully placed under direct laparoscopic visualization.   I could see omental adhesions and small bowel adhesions in the central abdomen.  I did laparoscopic lysis of adhesions to expose the entire anterior abdominal wall.  I primarily used and focused cold scissors.    Most of it was greater omentum.  There were numerous Swiss cheese hernias that were carefully reduced.  Some larger ones as well with omentum within them.  On the right side, there are more loops of small bowel adherent to the anterior abdominal wall.  The periumbilical hernia defect was the second largest.  6 x 3 cm.  Incarcerated with some small bowel and omentum.  Most inflamed area.  This was carefully freed off and reduced.  I proceeded to laparoscopic lysis lesions to free the greater omentum off of small intestine.  The distal jejunum had some inflamed hairpin turns consistent with the areas adherent to the anterior abdominal wall.  I carefully freed off these interloop adhesions.  There was an area where there seemed to be a small transition point and most inflamed area.  However no infection.    I made sure hemostasis was good.  I mapped out the region using a needle passer.   To ensure that I would have at least 5 cm radial coverage outside of the hernia defect, I chose a 33x27 cm dual sided mesh.  I placed #1 Prolene stitches around its edge about every 5 cm = 18 total.  I did bring up the small intestine and ran it again.  Freed off some omentum.  Saw no evidence of any enterotomy or serosal injury.  I rolled the mesh & placed into the peritoneal cavity through the larger ventral hernia fascial defect. I unrolled the mesh and positioned it appropriately.  I secured the mesh to cover up the hernia defect using a laparoscopic suture passer to pass the tails of the Prolene through the abdominal wall & tagged them with clamps.  I started out in four corners to make sure I had the mesh centered under the hernia defect  appropriately, and then proceeded to work in quadrants.  We evacuated CO2 & desufflated the abdomen.  I tied the fascial stitches down.  I reinsufflated the abdomen.  The mesh provided at least 5-10 cm circumferential coverage around the entire region of hernia defects.     I excised the stretched out skin and scar for the upper two thirds midline down to the umbilicus.  I primarily closed the three largest fascial hernia defects using #1 PDS.  The largest epigastric wound was closed transversely.  The smaller periumbilical ones were closed vertically.  Excess skin excised.  I placed #1 Prolene sutures right and left paramedian superior and umbilically and infraumbilically detailed centrally tacked the mesh with fascial sutures.  I also placed #1 Prolene through the two largest hernia closures and out the mesh to centrally tacked the mesh with Prolene as well.  Therefore 18 Prolene sutures at the edge, 4 in the middle, two in the center.  I tacked the edges & central part of the mesh to the peritoneum/posterior rectus fascia with SecureStrap absorbable tacks.   A good field block of local anesthesia was used at fascial stitch sites & fascial closure areas.  Hemostasis was excellent.  I did reinspection.  Small bowel laid well.  Greater omentum had been spared.  Hemostasis was good. Mesh laid well. Capnoperitoneum was evacuated. Ports were removed. The skin was closed with  Monocryl at the port sites and Steri-Strips on the fascial stitch puncture sites.  Midline closure with 3-0 Vicryl deep tumor sutures and running 4 Monocryl sutures and Steri-Strips.  Sterile dressings applied.  Patient is being extubated to go to the recovery room. I discussed operative findings, updated the patient's status, discussed probable steps to recovery, and gave postoperative recommendations to the patient's family.  Also the PACU RNs.  Recommendations were made.  Questions were answered.  They expressed understanding & appreciation.    Adin Hector, M.D., F.A.C.S. Gastrointestinal and Minimally Invasive Surgery [facility] [address] [place], [place] [address] [phone] Main / Paging

## 2015-08-11 NOTE — H&P (Signed)
Terry Martinez  Location: [facility] Patient #: [id number] DOB: 30-Aug-1940 Widowed / Language: Cleophus Molt / Race: White Male  Patient Care Team: [name], MD as PCP - General (Family Medicine) Newt Minion, MD as Consulting Physician (Orthopedic Surgery) Fay Records, MD as Consulting Physician (Cardiology) Michael Boston, MD as Consulting Physician (General Surgery) Angelia Mould, MD as Consulting Physician (Vascular Surgery)   History of Present Illness Patient words: hernia.  The patient is a 75 year old male who presents with an incisional hernia. Note for "Incisional hernia": Patient sent for surgical consultation at the request of his primary care physician, Dr. Ardelia Mems. Concern for incisional hernia.  Pleasant elderly gentleman. He comes today with his brother-in-law. 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 History of exploratory laparotomy. Failed aortic graft attempt. Converted to left axillary-femorofemoral bypass grafts. Had big open incision for some of the surgeries. His noticeable lump at his incision. Concern for worsening hernias. CT scan-near showed small bowel within it as well. Chronically anticoagulated for atrial fibrillation. Follow by Dorris Carnes, MD, [facility] cardiology, feels he can tolerate such an operation if needed.  He's noticed increased bulging over the past few years. Some discomfort. There is discussion about hernia repair when he lived in [place] a few years ago. However they wanted him to lose weight. Family has relocated to [place]. He's intentionally lost 40 pounds. He feels like hernias are getting larger. He's been told her small bowel within it. Confirmed by CT scan last year. He notices some gurgling. Can reduce when he lies down but sometimes sensitive. Some episodes of sharp pain. Concerns and. He wishes to be aggressive and have surgery to fix  this.  Usually has bowel movement every morning without this MiraLAX. He does walk with a cane has some moderate chronic back issues with the rods. That is stable. Gilford Rile is mainly for some neuropathy and other lower extremity issues. Helps with balance. He feels like he can do 15-20 minutes walking without much difficulty. Just a slow pace given his ankle, back, and joint pains  No new events.  Ready for surgery   Other Problems Davy Pique Bynum, [place]; 07/05/2015 2:47 PM) Atrial Fibrillation Back Pain Cancer Hemorrhoids Hypercholesterolemia Inguinal Hernia Myocardial infarction Pulmonary Embolism / Blood Clot in Legs Umbilical Hernia Repair Vascular Disease  Past Surgical History Marjean Donna, CMA; 07/05/2015 2:47 PM) Bypass Surgery for Poor Blood Flow to Legs Carotid Artery Surgery Bilateral. Cataract Surgery Bilateral. Colon Polyp Removal - Colonoscopy Colon Polyp Removal - Open Coronary Artery Bypass Graft Foot Surgery Right. Resection of Stomach Spinal Surgery - Lower Back Spinal Surgery Midback TURP Ventral / Umbilical Hernia Surgery multiple  Diagnostic Studies History Marjean Donna, CMA; 07/05/2015 2:47 PM) Colonoscopy 1-5 years ago  Allergies Marjean Donna, CMA; 07/05/2015 2:48 PM) Simvastatin *ANTIHYPERLIPIDEMICS*  Medication History (Sonya Bynum, CMA; 07/05/2015 2:51 PM) Furosemide (20MG  Tablet, Oral) Active. Warfarin Sodium (5MG  Tablet, Oral) Active. Allopurinol (100MG  Tablet, Oral) Active. Enalapril Maleate (10MG  Tablet, Oral) Active. Metoprolol-HCTZ ER (50-12.5MG  Tablet ER 24HR, Oral) Active. [facility] Packet, Oral) Active. Lovastatin (20MG  Tablet, Oral) Active. Nitroglycerin (0.4MG  Tab Sublingual, Sublingual as needed) Active. Medications Reconciled  Social History Marjean Donna, CMA; 07/05/2015 2:47 PM) Caffeine use Carbonated beverages, Coffee. No alcohol use No drug use Tobacco use Current every day  smoker.  Family History Marjean Donna, [place]; 07/05/2015 2:47 PM) Alcohol Abuse Brother. Arthritis Mother, Sister. Bleeding disorder Sister. Breast Cancer Mother. Cancer Father, Mother. Cerebrovascular Accident  Sister. Colon Polyps Brother. Diabetes Mellitus Mother, Son. Heart Disease Brother, Mother, Sister. Heart disease in male family member before age 73 Hypertension Brother, Sister, Son. Melanoma Family Members In General. Migraine Headache Sister. Respiratory Condition Brother, Father, Mother. Thyroid problems Sister.    Review of Systems ([place]; 07/05/2015 2:47 PM) General Present- Fatigue. Not Present- Appetite Loss, Chills, Fever, Night Sweats, Weight Gain and Weight Loss. Skin Present- Dryness and Non-Healing Wounds. Not Present- Change in Wart/Mole, Hives, Jaundice, New Lesions, Rash and Ulcer. HEENT Present- Wears glasses/contact lenses. Not Present- Earache, Hearing Loss, Hoarseness, Nose Bleed, Oral Ulcers, Ringing in the Ears, Seasonal Allergies, Sinus Pain, Sore Throat, Visual Disturbances and Yellow Eyes. Respiratory Present- Snoring. Not Present- Bloody sputum, Chronic Cough, Difficulty Breathing and Wheezing. Breast Not Present- Breast Mass, Breast Pain, Nipple Discharge and Skin Changes. Cardiovascular Present- Leg Cramps, Palpitations, Shortness of Breath and Swelling of Extremities. Not Present- Chest Pain, Difficulty Breathing Lying Down and Rapid Heart Rate. Gastrointestinal Present- Bloating and Constipation. Not Present- Abdominal Pain, Bloody Stool, Change in Bowel Habits, Chronic diarrhea, Difficulty Swallowing, Excessive gas, Gets full quickly at meals, Hemorrhoids, Indigestion, Nausea, Rectal Pain and Vomiting. Male Genitourinary Present- Frequency and Urine Leakage. Not Present- Blood in Urine, Change in Urinary Stream, Impotence, Nocturia, Painful Urination and Urgency.  Vitals (Sonya Bynum CMA; 07/05/2015 2:48 PM) 07/05/2015 2:48  PM Weight: 290 lb Height: 75in Body Surface Area: 2.57 m Body Mass Index: 36.25 kg/m  Temp.: 25F(Temporal)  Pulse: 81 (Regular)  BP: 134/74 (Sitting, Left Arm, Standard)       Physical Exam Adin Hector MD; 07/05/2015 3:33 PM) General Mental Status-Alert. General Appearance-Not in acute distress, Not Sickly. Orientation-Oriented X3. Hydration-Well hydrated. Voice-Normal.  Integumentary Global Assessment Upon inspection and palpation of skin surfaces of the - Axillae: non-tender, no inflammation or ulceration, no drainage. and Distribution of scalp and body hair is normal. General Characteristics Temperature - normal warmth is noted.  Head and Neck Head-normocephalic, atraumatic with no lesions or palpable masses. Face Global Assessment - atraumatic, no absence of expression. Neck Global Assessment - no abnormal movements, no bruit auscultated on the right, no bruit auscultated on the left, no decreased range of motion, non-tender. Trachea-midline. Thyroid Gland Characteristics - non-tender.  Eye Eyeball - Left-Extraocular movements intact, No Nystagmus. Eyeball - Right-Extraocular movements intact, No Nystagmus. Cornea - Left-No Hazy. Cornea - Right-No Hazy. Sclera/Conjunctiva - Left-No scleral icterus, No Discharge. Sclera/Conjunctiva - Right-No scleral icterus, No Discharge. Pupil - Left-Direct reaction to light normal. Pupil - Right-Direct reaction to light normal. Note: Wears glasses. Vision acceptable   ENMT Ears Pinna - Left - no drainage observed, no generalized tenderness observed. Right - no drainage observed, no generalized tenderness observed. Nose and Sinuses External Inspection of the Nose - no destructive lesion observed. Inspection of the nares - Left - quiet respiration. Right - quiet respiration. Mouth and Throat Lips - Upper Lip - no fissures observed, no pallor noted. Lower Lip - no fissures  observed, no pallor noted. Nasopharynx - no discharge present. Oral Cavity/Oropharynx - Tongue - no dryness observed. Oral Mucosa - no cyanosis observed. Hypopharynx - no evidence of airway distress observed.  Chest and Lung Exam Inspection Movements - Normal and Symmetrical. Accessory muscles - No use of accessory muscles in breathing. Palpation Palpation of the chest reveals - Non-tender. Auscultation Breath sounds - Normal and Clear.  Cardiovascular Auscultation Rhythm - Regular. Murmurs & Other Heart Sounds - Auscultation of the heart reveals - No Murmurs and No  Systolic Clicks. Note: Axillary bifemoral graft along left mid axillary line. Not easily palpable but has obvious audible thrill.   Abdomen Inspection Inspection of the abdomen reveals - No Visible peristalsis and No Abnormal pulsations. Umbilicus - No Bleeding, No Urine drainage. Palpation/Percussion Palpation and Percussion of the abdomen reveal - Soft, Non Tender, No Rebound tenderness, No Rigidity (guarding) and No Cutaneous hyperesthesia. Note: Obese but soft. Large midline incision. A 10 x 20 cm region of bulging. 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 Most of them 3-4 cm size defects   Male Genitourinary Sexual Maturity Tanner 5 - Adult hair pattern and Adult penile size and shape. Note: No inguinal hernias. No lymphadenopathy. Normal external genitalia. Femorofemoral graft inferior to the pubic tubercle traversing across the inferior mons pubis.   Peripheral Vascular Upper Extremity Inspection - Left - No Cyanotic nailbeds, Not Ischemic. Right - No Cyanotic nailbeds, Not Ischemic.  Neurologic Neurologic evaluation reveals -normal attention span and ability to concentrate, able to name objects and repeat phrases. Appropriate fund of knowledge , normal sensation and normal coordination. Mental Status Affect - not angry, not paranoid. Cranial Nerves-Normal  Bilaterally. Gait-Normal.  Neuropsychiatric Mental status exam performed with findings of-able to articulate well with normal speech/language, rate, volume and coherence, thought content normal with ability to perform basic computations and apply abstract reasoning and no evidence of hallucinations, delusions, obsessions or homicidal/suicidal ideation.  Musculoskeletal Global Assessment Spine, Ribs and Pelvis - no instability, subluxation or laxity. Right Upper Extremity - no instability, subluxation or laxity.  Lymphatic Head & Neck  General Head & Neck Lymphatics: Bilateral - Description - No Localized lymphadenopathy. Axillary  General Axillary Region: Bilateral - Description - No Localized lymphadenopathy. Femoral & Inguinal  Generalized Femoral & Inguinal Lymphatics: Left - Description - No Localized lymphadenopathy. Right - Description - No Localized lymphadenopathy.    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 Increasing in size and discomfort. Small bowel within certain areas.  I think he would benefit from surgical repair. Most likely a combination of laparoscopic underlay mesh with primary closure of the larger areas. Take care to avoid injuring the the left axillary or suprapubic femorofemoral bypass grafts. Possible conversion to open with component separation and closure over drains if that defects are too large. I suspect most the defects are 4 cm or less. Just a lot of Swiss cheese. Most likely we'll need to excise excess skin. We will see.  I cautioned him that this will be a very sore surgery and he will need pain medications. He is optimistic he'll only need Tylenol. I cautioned him that was not realistic. His brother-in-law agreed.  His risks are increased but he has been cleared by cardiology. Need systolic was warfarin 5 days  preop. However his risks and emergency center to be much much higher. Likely cannot do mesh underlay which remained failure rate of hernia repair would be much higher. It is getting larger. It is bothering him. He is hurting him. He is worried about small bowel within it. He wishes to have it repaired. Her repair was discussed a few years ago when he lived in [place]. Concern of his obesity making recurrence rate higher. He still had a large obese man, he's lost 40 pounds since then. He is motivated to exercise more and try and keep the weight off.  Trying to lose even more weight.  I have re-reviewed the the patient's records, history, medications, and allergies.  I have re-examined the patient.  I again discussed intraoperative plans and goals of post-operative recovery.  The patient agrees to proceed.   ENCOUNTER FOR PRE-OPERATIVE EXAMINATION - [place] RR:[id number]) Current Plans You are being scheduled for surgery - Our schedulers will call you.  You should hear from our office's scheduling department within 5 working days about the location, date, and time of surgery. We try to make accommodations for patient's preferences in scheduling surgery, but sometimes the OR schedule or the surgeon's schedule prevents [place] from making those accommodations.  If you have not heard from our office [phone]) in 5 working days, call the office and ask for your surgeon's nurse.  If you have other questions about your diagnosis, plan, or surgery, call the office and ask for your surgeon's nurse.  Written instructions provided The anatomy & physiology of the abdominal wall was discussed. The pathophysiology of hernias was discussed. Natural history risks without surgery including progeressive enlargement, pain, incarceration, & strangulation was discussed. Contributors to complications such as smoking, obesity, diabetes, prior surgery, etc were discussed.  I feel the risks of no intervention will lead to serious  problems that outweigh the operative risks; therefore, I recommended surgery to reduce and repair the hernia. I explained laparoscopic techniques with possible need for an open approach. I noted the probable use of mesh to patch and/or buttress the hernia repair  Risks such as bleeding, infection, abscess, need for further treatment, heart attack, death, and other risks were discussed. I noted a good likelihood this will help address the problem. Goals of post-operative recovery were discussed as well. Possibility that this will not correct all symptoms was explained. I stressed the importance of low-impact activity, aggressive pain control, avoiding constipation, & not pushing through pain to minimize risk of post-operative chronic pain or injury. Possibility of reherniation especially with smoking, obesity, diabetes, immunosuppression, and other health conditions was discussed. We will work to minimize complications.  An educational handout further explaining the pathology & treatment options was given as well. Questions were answered. The patient expresses understanding & wishes to proceed with surgery.  Pt Education - Pamphlet Given - Laparoscopic Hernia Repair: discussed with patient and provided information. Pt Education - [facility] Hernia Post-Op HCI (Michala Deblanc): discussed with patient and provided information. Pt Education - [facility] Pain Control (Saliou Barnier)  Adin Hector, M.D., F.A.C.S. Gastrointestinal and Minimally Invasive Surgery [facility] [address], [place], [place] [address] [phone] Main / Paging

## 2015-08-11 NOTE — Anesthesia Postprocedure Evaluation (Signed)
Anesthesia Post Note  Patient: Terry Martinez  Procedure(s) Performed: Procedure(s) (LRB): LAPAROSCOPIC LYSIS OF ADHESIONS (N/A) LAPAROSCOPIC ASSISTED VENTRAL WALL HERNIA REPAIR with mesh (N/A)  Patient location during evaluation: PACU Anesthesia Type: General Level of consciousness: awake and alert Pain management: pain level controlled Vital Signs Assessment: post-procedure vital signs reviewed and stable Respiratory status: spontaneous breathing, nonlabored ventilation, respiratory function stable and patient connected to nasal cannula oxygen Cardiovascular status: blood pressure returned to baseline and stable Postop Assessment: no signs of nausea or vomiting Anesthetic complications: no    Last Vitals:  Filed Vitals:   08/11/15 1345 08/11/15 1355  BP:  159/77  Pulse: 79   Temp:  36.4 C  Resp: 20 16    Last Pain: There were no vitals filed for this visit.               Nyesha Cliff J

## 2015-08-11 NOTE — Anesthesia Procedure Notes (Signed)
Procedure Name: Intubation Date/Time: 08/11/2015 8:33 AM Performed by: Danley Danker L Patient Re-evaluated:Patient Re-evaluated prior to inductionOxygen Delivery Method: Circle system utilized Preoxygenation: Pre-oxygenation with 100% oxygen Intubation Type: IV induction Ventilation: Mask ventilation without difficulty and Oral airway inserted - appropriate to patient size Laryngoscope Size: Miller and 3 Grade View: Grade I Tube type: Oral Tube size: 8.0 mm Number of attempts: 1 Airway Equipment and Method: Stylet Placement Confirmation: ETT inserted through vocal cords under direct vision,  breath sounds checked- equal and bilateral and positive ETCO2 Secured at: 22 cm Tube secured with: Tape Dental Injury: Teeth and Oropharynx as per pre-operative assessment

## 2015-08-11 NOTE — Anesthesia Preprocedure Evaluation (Addendum)
Anesthesia Evaluation  Patient identified by MRN, date of birth, ID band Patient awake  General Assessment Comment:Past Medical History Diagnosis Date . Hyperlipidemia  . Hypertension  . COPD (chronic obstructive pulmonary disease) ([place])  . Peripheral artery disease ([place])  . Atrial fibrillation ([place])  . Chronic pain  . Pneumonia  . Headache    occasional . Cancer (HCC)    squamous cell carcinoma - left arm . Anemia    hx low iron . Myocardial infarction ([place])    3 stents . Umbilical hernia  . Family history of adverse reaction to anesthesia    sister has difficulty waking up . History of kidney stones  . Constipation  . DVT, lower extremity ([place])    many years . Incisional hernia    x 2       Reviewed: Allergy & Precautions, NPO status , Patient's Chart, lab work & pertinent test results  History of Anesthesia Complications (+) Family history of anesthesia reaction  Airway Mallampati: II  TM Distance: >3 FB Neck ROM: Full    Dental no notable dental hx.    Pulmonary pneumonia, COPD, former smoker,    Pulmonary exam normal breath sounds clear to auscultation       Cardiovascular hypertension, Pt. on medications and Pt. on home beta blockers + CAD, + Past MI and + Peripheral Vascular Disease  negative cardio ROS Normal cardiovascular exam Rhythm:Regular Rate:Normal  ECHO 02-06-14: Study Conclusions  - Left ventricle: The cavity size was mildly dilated. Systolic function was mildly reduced. The estimated ejection fraction was in the range of 45% to 50%. Although no diagnostic regional wall motion abnormality was identified, this possibility cannot be completely excluded on the basis of this study. - Aortic valve: There was mild to moderate regurgitation directed eccentrically in the LVOT. - Mitral valve: Calcified annulus.  There was mild to moderate regurgitation directed centrally. - Left atrium: The atrium was moderately dilated. - Right ventricle: The cavity size was moderately dilated. Systolic function was moderately reduced. - Right atrium: The atrium was severely dilated. - Atrial septum: No defect or patent foramen ovale was identified. - Pulmonary arteries: Systolic pressure was mildly increased. PA peak pressure: 47 mm Hg (S).   Neuro/Psych  Headaches, negative psych ROS   GI/Hepatic negative GI ROS, Neg liver ROS,   Endo/Other  negative endocrine ROS  Renal/GU Renal InsufficiencyRenal diseaseCr 1.6 K 4.8  negative genitourinary   Musculoskeletal negative musculoskeletal ROS (+)   Abdominal   Peds negative pediatric ROS (+)  Hematology  (+) anemia ,   Anesthesia Other Findings   Reproductive/Obstetrics negative OB ROS                          Anesthesia Physical Anesthesia Plan  ASA: IV  Anesthesia Plan: General   Post-op Pain Management:    Induction: Intravenous  Airway Management Planned: Oral ETT  Additional Equipment:   Intra-op Plan:   Post-operative Plan: Extubation in OR  Informed Consent: I have reviewed the patients History and Physical, chart, labs and discussed the procedure including the risks, benefits and alternatives for the proposed anesthesia with the patient or authorized representative who has indicated his/her understanding and acceptance.   Dental advisory given  Plan Discussed with: CRNA  Anesthesia Plan Comments:        Anesthesia Quick Evaluation

## 2015-08-11 NOTE — Interval H&P Note (Signed)
History and Physical Interval Note:  08/11/2015 8:18 AM  Terry Martinez  has presented today for surgery, with the diagnosis of Incisional wall hernias   The various methods of treatment have been discussed with the patient and family. After consideration of risks, benefits and other options for treatment, the patient has consented to  Procedure(s): LAPAROSCOPIC LYSIS OF ADHESIONS (N/A) [name] (N/A) as a surgical intervention .  The patient's history has been reviewed, patient examined, no change in status, stable for surgery.  I have reviewed the patient's chart and labs.  Questions were answered to the patient's satisfaction.     Latwan Luchsinger C.

## 2015-08-11 NOTE — Transfer of Care (Signed)
Immediate Anesthesia Transfer of Care Note  Patient: Terry Martinez  Procedure(s) Performed: Procedure(s): LAPAROSCOPIC LYSIS OF ADHESIONS (N/A) LAPAROSCOPIC ASSISTED VENTRAL WALL HERNIA REPAIR with mesh (N/A)  Patient Location: PACU  Anesthesia Type:General  Level of Consciousness: awake and oriented  Airway & Oxygen Therapy: Patient Spontanous Breathing and Patient connected to face mask oxygen  Post-op Assessment: Report given to RN and Post -op Vital signs reviewed and stable  Post vital signs: Reviewed and stable  Last Vitals:  Filed Vitals:   08/11/15 0624  BP: 113/57  Pulse: 50  Temp: 37 C  Resp: 18    Last Pain: There were no vitals filed for this visit.    Patients Stated Pain Goal: 4 ([id number])  Complications: No apparent anesthesia complications

## 2015-08-11 NOTE — Progress Notes (Signed)
Right bottom of foot has noted drainage .[name] Pt saw Dr. Sharol Given  6/19.. Pt has teds on prior to arrival to hospital.

## 2015-08-12 ENCOUNTER — Encounter (HOSPITAL_COMMUNITY): Payer: Self-pay | Admitting: Surgery

## 2015-08-12 DIAGNOSIS — K430 Incisional hernia with obstruction, without gangrene: Secondary | ICD-10-CM | POA: Diagnosis not present

## 2015-08-12 DIAGNOSIS — I4891 Unspecified atrial fibrillation: Secondary | ICD-10-CM | POA: Diagnosis not present

## 2015-08-12 DIAGNOSIS — E785 Hyperlipidemia, unspecified: Secondary | ICD-10-CM | POA: Diagnosis not present

## 2015-08-12 DIAGNOSIS — I700 Atherosclerosis of aorta: Secondary | ICD-10-CM | POA: Diagnosis not present

## 2015-08-12 DIAGNOSIS — G629 Polyneuropathy, unspecified: Secondary | ICD-10-CM | POA: Diagnosis not present

## 2015-08-12 DIAGNOSIS — K660 Peritoneal adhesions (postprocedural) (postinfection): Secondary | ICD-10-CM | POA: Diagnosis not present

## 2015-08-12 LAB — BASIC METABOLIC PANEL
Anion gap: 8 (ref 5–15)
BUN: 28 mg/dL — AB (ref 6–20)
CALCIUM: 8.7 mg/dL — AB (ref 8.9–10.3)
CHLORIDE: 102 mmol/L (ref 101–111)
CO2: 25 mmol/L (ref 22–32)
Creatinine, Ser: 1.33 mg/dL — ABNORMAL HIGH (ref 0.61–1.24)
GFR calc Af Amer: 59 mL/min — ABNORMAL LOW (ref >60)
GFR, EST NON AFRICAN AMERICAN: 51 mL/min — AB (ref >60)
Glucose, Bld: 133 mg/dL — ABNORMAL HIGH (ref 65–99)
POTASSIUM: 4.9 mmol/L (ref 3.5–5.1)
SODIUM: 135 mmol/L (ref 135–145)

## 2015-08-12 LAB — HEMOGLOBIN: HEMOGLOBIN: 12.2 g/dL — AB (ref 13.0–17.0)

## 2015-08-12 MED ORDER — SODIUM CHLORIDE 0.9% FLUSH
3.0000 mL | Freq: Two times a day (BID) | INTRAVENOUS | Status: DC
  Administered 2015-08-12: 3 mL via INTRAVENOUS

## 2015-08-12 MED ORDER — SODIUM CHLORIDE 0.9% FLUSH: 3.0000 mL | INTRAVENOUS | Status: DC | PRN

## 2015-08-12 MED ORDER — HYDROMORPHONE HCL 1 MG/ML IJ SOLN
0.5000 mg | INTRAMUSCULAR | Status: DC | PRN
  Filled 2015-08-12: qty 1

## 2015-08-12 MED ORDER — LACTATED RINGERS IV BOLUS (SEPSIS): 1000.0000 mL | Freq: Three times a day (TID) | INTRAVENOUS | Status: DC | PRN

## 2015-08-12 MED ORDER — SODIUM CHLORIDE 0.9 % IV SOLN: 250.0000 mL | INTRAVENOUS | Status: DC | PRN

## 2015-08-12 NOTE — Progress Notes (Signed)
[facility]  [address] [place], [place] 999-26-5244 Phone: [phone] FAX: [place] [phone] [birth year]  CARE TEAM:  PCP: Lavon Paganini, MD  Outpatient Care Team: Patient Care Team: [name], MD as PCP - General (Family Medicine) Newt Minion, MD as Consulting Physician (Orthopedic Surgery) Fay Records, MD as Consulting Physician (Cardiology) Michael Boston, MD as Consulting Physician (General Surgery) Angelia Mould, MD as Consulting Physician (Vascular Surgery)  Inpatient Treatment Team: Treatment Team: Attending Provider: Michael Boston, MD; Physical Therapist: Mathis Fare, PT; Technician: Leda Quail, NT; Occupational Therapist: Walker Kehr, OT  Problem List:   Principal Problem:   Incarcerated incisional hernia s/p lap LOA & repair with mesh 08/11/2015 Active Problems:   PAD (peripheral artery disease) ([place])   HTN (hypertension)   Atrial fibrillation ([place])   HLD (hyperlipidemia)   Gout   Long term current use of anticoagulant therapy   Obesity   S/P Left axillobifemoral bypass graft   1 Day Post-Op  08/11/2015  Procedure(s): LAPAROSCOPIC LYSIS OF ADHESIONS LAPAROSCOPIC ASSISTED VENTRAL WALL HERNIA REPAIR with mesh   Assessment  Recovering  Plan:  -wean IVF -wean oxygen -d/c foley -pain control - inc PO use -control HTN -check labs -gout prophyllaxis -VTE prophylaxis- SCDs, etc -mobilize as tolerated to help recovery  D/C patient from hospital when patient meets criteria (anticipate in 1-2 day(s)):  Tolerating oral intake well Ambulating well Adequate pain control without IV medications Urinating  Having flatus Disposition planning in place   I updated the patient's status to the patient and nurse.  Recommendations were made.  Questions were answered.  They expressed understanding & appreciation.   Adin Hector, M.D.,  F.A.C.S. Gastrointestinal and Minimally Invasive Surgery [facility] [address] [place], [place] [address] [phone] Main / Paging   08/12/2015  Subjective:  Sore - pain meds helped Not walked yet Wants to go home & not SNF if possible - prob tomorrow On oxygen  Objective:  Vital signs:  Filed Vitals:   08/11/15 1700 08/11/15 2158 08/12/15 0211 08/12/15 0528  BP: 144/67 122/74 118/53 127/71  Pulse: 88 61 94 58  Temp: 98.3 F (36.8 C) 97.5 F (36.4 C) 97.7 F (36.5 C) 97.8 F (36.6 C)  TempSrc: Oral Oral Oral Oral  Resp: 116 16 16 16   Height:      Weight:      SpO2: 97% 98% 99% 99%       Intake/Output   Yesterday:  06/21 0701 - 06/22 0700 In: 3932.5 [P.O.:1320; I.V.:2400; IV Piggyback:212.5] Out: 1472.5 [Urine:1300; Drains:72.5; Blood:100] This shift:     Bowel function:  Flatus: YES  BM:  No  Drain: Serosanguinous   Physical Exam:  General: Pt awake/alert/oriented x4 in No acute distress Eyes: PERRL, normal EOM.  Sclera clear.  No icterus Neuro: CN II-XII intact w/o focal sensory/motor deficits. Lymph: No head/neck/groin lymphadenopathy Psych:  No delerium/psychosis/paranoia HENT: Normocephalic, Mucus membranes moist.  No thrush Neck: Supple, No tracheal deviation Chest: No chest wall pain w good excursion CV:  Pulses intact.  Regular rhythm MS: Normal AROM mjr joints.  No obvious deformity Abdomen: Soft.  Nondistended.  Mildly tender at incisions only.  No evidence of peritonitis.  No incarcerated hernias. Ext:  SCDs BLE.  No mjr edema.  No cyanosis Skin: No petechiae / purpura  Results:   Labs: Results for orders placed or performed during the hospital encounter  of 08/11/15 (from the past 48 hour(s))  PT- INR Day of Surgery     Status: Abnormal   Collection Time: 08/11/15  6:30 AM  Result Value Ref Range   Prothrombin Time 15.4 (H) 11.6 - 15.2 seconds   INR 1.21 0.00 - 1.49    Imaging /  Studies: No results found.  Medications / Allergies: per chart  Antibiotics: Anti-infectives    Start     Dose/Rate Route Frequency Ordered Stop   08/11/15 1600  ceFAZolin (ANCEF) 3 g in dextrose 5 % 50 mL IVPB    Comments:  Pharmacy may adjust dosing strength, schedule, rate of infusion, etc as needed to optimize therapy   3 g 130 mL/hr over 30 Minutes Intravenous Every 8 hours 08/11/15 1359 08/12/15 0006   08/11/15 0831  gentamicin (GARAMYCIN) 500 mg in dextrose 5 % 100 mL IVPB     500 mg 225 mL/hr over 30 Minutes Intravenous 30 min pre-op 08/11/15 0831 08/11/15 0922   08/11/15 0600  ceFAZolin (ANCEF) 3 g in dextrose 5 % 50 mL IVPB     3 g 130 mL/hr over 30 Minutes Intravenous On call to O.R. 08/10/15 1338 08/11/15 0839   08/11/15 0600  gentamicin (GARAMYCIN) 500 mg in dextrose 5 % 50 mL IVPB  Status:  Discontinued    Comments:  Pharmacy may adjust dosing strength, schedule, rate of infusion, etc as needed to optimize therapy Send with patient on call to the OR.  Anesthesia to complete antibiotic administration <68min prior to incision per [facility].   500 mg 125 mL/hr over 30 Minutes Intravenous On call to O.R. 08/10/15 1338 08/11/15 0831        Note: Portions of this report may have been transcribed using voice recognition software. Every effort was made to ensure accuracy; however, inadvertent computerized transcription errors may be present.   Any transcriptional errors that result from this process are unintentional.     Adin Hector, M.D., F.A.C.S. Gastrointestinal and Minimally Invasive Surgery [facility] [address], [place], [place] [address] [phone] Main / Paging   08/12/2015

## 2015-08-12 NOTE — Care Management Obs Status (Signed)
[facility] NOTIFICATION   Patient Details  Name: Terry Martinez MRN: [id number] Date of Birth: [birth year]   Medicare Observation Status Notification Given:  Yes    Guadalupe Maple, RN 08/12/2015, 1:05 PM

## 2015-08-12 NOTE — Evaluation (Signed)
Physical Therapy Evaluation Patient Details Name: Terry Martinez MRN: [id number] DOB: 1940-05-09 Today's Date: 08/12/2015   History of Present Illness  This 75 y.o. male admitted for repair with mesh of incarcerated hernia.  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  Clinical Impression  Pt s/p hernia repair presents with functional mobility limitations 2* abdominal pain, obesity and ambulatory balance deficits.  Pt should progress to dc home with family assist.    Follow Up Recommendations No PT follow up    Equipment Recommendations  None recommended by PT    Recommendations for Other Services OT consult     Precautions / Restrictions Precautions Precautions: Fall;Other (comment) Precaution Comments: abdominal binder  Restrictions Weight Bearing Restrictions: No      Mobility  Bed Mobility               General bed mobility comments: NT - pt OOB with OT and declines back to bed  Transfers Overall transfer level: Needs assistance Equipment used: Rolling walker (2 wheeled) Transfers: Sit to/from Stand Sit to Stand: Min guard         General transfer comment: Min guard for initial steady on standing  Ambulation/Gait Ambulation/Gait assistance: Min assist;Min guard Ambulation Distance (Feet): 250 Feet Assistive device: Rolling walker (2 wheeled) Gait Pattern/deviations: Step-through pattern;Decreased step length - right;Decreased step length - left;Trunk flexed;Shuffle;Wide base of support Gait velocity: decr Gait velocity interpretation: Below normal speed for age/gender General Gait Details: Cues for posture, position from RW and saftey with turns  Stairs            Wheelchair Mobility    Modified Rankin (Stroke Patients Only)       Balance Overall balance assessment: Needs assistance Sitting-balance support:  No upper extremity supported;Feet supported Sitting balance-Leahy Scale: Good     Standing balance support: Bilateral upper extremity supported Standing balance-Leahy Scale: Poor                               Pertinent Vitals/Pain Pain Assessment: 0-10 Pain Score: 6  Pain Location: abdomen Pain Descriptors / Indicators: Sore Pain Intervention(s): Limited activity within patient's tolerance;Monitored during session;Premedicated before session    Home Living Family/patient expects to be discharged to:: Private residence Living Arrangements: Other relatives Available Help at Discharge: Family;Available 24 hours/day Type of Home: House Home Access: Stairs to enter Entrance Stairs-Rails: Right;Left;Can reach both Entrance Stairs-Number of Steps: 1 Home Layout: One level Home Equipment: Walker - 2 wheels;Walker - 4 wheels;Cane - single point;Bedside commode;Shower seat;Grab bars - toilet;Grab bars - tub/shower;Adaptive equipment      Prior Function Level of Independence: Independent with assistive device(s)         Comments: used cane     Hand Dominance   Dominant Hand: Right    Extremity/Trunk Assessment   Upper Extremity Assessment: Overall WFL for tasks assessed           Lower Extremity Assessment: Overall WFL for tasks assessed      Cervical / Trunk Assessment: Other exceptions  Communication   Communication: No difficulties  Cognition Arousal/Alertness: Awake/alert Behavior During Therapy: Impulsive Overall Cognitive Status: Within Functional Limits for tasks assessed                      General Comments  Exercises        Assessment/Plan    PT Assessment Patient needs continued PT services  PT Diagnosis Difficulty walking   PT Problem List Decreased strength;Decreased range of motion;Decreased activity tolerance;Decreased balance;Decreased mobility;Decreased knowledge of use of DME;Pain;Obesity;Decreased safety  awareness  PT Treatment Interventions DME instruction;Gait training;Stair training;Functional mobility training;Therapeutic activities;Therapeutic exercise;Patient/family education   PT Goals (Current goals can be found in the Care Plan section) Acute Rehab PT Goals Patient Stated Goal: to go home  PT Goal Formulation: With patient Time For Goal Achievement: 08/16/15 Potential to Achieve Goals: Good    Frequency Min 3X/week   Barriers to discharge        Co-evaluation               End of Session Equipment Utilized During Treatment: Gait belt Activity Tolerance: Patient tolerated treatment well Patient left: in chair;with call bell/phone within reach;with family/visitor present Nurse Communication: Mobility status    Functional Assessment Tool Used: Clinical judgement Functional Limitation: Mobility: Walking and moving around Mobility: Walking and Moving Around Current Status [phone]): At least 1 percent but less than 20 percent impaired, limited or restricted Mobility: Walking and Moving Around Goal Status [phone]): At least 1 percent but less than 20 percent impaired, limited or restricted    Time: 1029-1051 PT Time Calculation (min) (ACUTE ONLY): 22 min   Charges:   PT Evaluation $PT Eval Low Complexity: 1 Procedure     PT G Codes:   PT G-Codes **NOT FOR INPATIENT CLASS** Functional Assessment Tool Used: Clinical judgement Functional Limitation: Mobility: Walking and moving around Mobility: Walking and Moving Around Current Status [id number]): At least 1 percent but less than 20 percent impaired, limited or restricted Mobility: Walking and Moving Around Goal Status [phone]): At least 1 percent but less than 20 percent impaired, limited or restricted    Karry Causer 08/12/2015, 1:47 PM

## 2015-08-12 NOTE — Evaluation (Signed)
Occupational Therapy Evaluation Patient Details Name: Terry Martinez MRN: [id number] DOB: [birth year] Today's Date: 08/12/2015    History of Present Illness This 75 y.o. male admitted for repair with mesh of incarcerated hernia.  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   Clinical Impression   Pt admitted with above. He demonstrates the below listed deficits and will benefit from continued OT to maximize safety and independence with BADLs.  Pt presents to OT with generalized weakness, increased pain, impaired balance.  He requires min A for ADLs.  He plans to discharge home with family who can provide assist as necessary       Follow Up Recommendations  No OT follow up;Supervision/Assistance - 24 hour    Equipment Recommendations  None recommended by OT    Recommendations for Other Services       Precautions / Restrictions Precautions Precautions: Fall;Other (comment) Precaution Comments: abdominal binder  Restrictions Weight Bearing Restrictions: No      Mobility Bed Mobility Overal bed mobility: Needs Assistance Bed Mobility: Supine to Sit     Supine to sit: Min guard;HOB elevated     General bed mobility comments: Pt instructed to log roll multiple times, but disregarded instruction and moved supine to sit.  Reinforced with him the recommendation to roll to reduce strain/stress over incisional area   Transfers Overall transfer level: Needs assistance Equipment used: Rolling walker (2 wheeled) Transfers: Sit to/from [facility] Sit to Stand: Min assist Stand pivot transfers: Min assist       General transfer comment: Pt mildly unsteady upon standing requiring min A.  Pt is impulsive and requires cues to slow down     Balance Overall balance assessment: Needs assistance Sitting-balance support: Feet  supported Sitting balance-Leahy Scale: Good     Standing balance support: Bilateral upper extremity supported Standing balance-Leahy Scale: Poor Standing balance comment: requires min A                             ADL Overall ADL's : Needs assistance/impaired Eating/Feeding: Independent   Grooming: Wash/dry hands;Wash/dry face;Oral care;Brushing hair;Minimal assistance;Standing   Upper Body Bathing: Set up;Sitting   Lower Body Bathing: Minimal assistance;Sit to/from stand   Upper Body Dressing : Set up;Sitting   Lower Body Dressing: Minimal assistance;Sit to/from stand   Toilet Transfer: Minimal assistance;Stand-pivot;BSC;RW   Toileting- Clothing Manipulation and Hygiene: Minimal assistance;Sit to/from stand       Functional mobility during ADLs: Minimal assistance;Rolling walker General ADL Comments: Pt is very impulsive with mobillity.  He props LEs on bed to access feet.  He uses AE at home when needed.  He wears suspenders on his pants, and tosses pants towards feet to threat LEs through pant legs      Vision     Perception     Praxis      Pertinent Vitals/Pain Pain Assessment: 0-10 Pain Score: 7  Pain Location: abdominal  Pain Descriptors / Indicators: Aching;Operative site guarding Pain Intervention(s): Limited activity within patient's tolerance;Monitored during session;Patient requesting pain meds-RN notified     Hand Dominance Right   Extremity/Trunk Assessment Upper Extremity Assessment Upper Extremity Assessment: Overall WFL for tasks assessed   Lower Extremity Assessment Lower Extremity Assessment: Defer to PT evaluation   Cervical / Trunk Assessment Cervical / Trunk Assessment: Other exceptions (limited by  abdominal distention and pain )   Communication Communication Communication: No difficulties   Cognition Arousal/Alertness: Awake/alert Behavior During Therapy: Impulsive Overall Cognitive Status: Within Functional Limits for  tasks assessed                     General Comments       Exercises       Shoulder Instructions      Home Living Family/patient expects to be discharged to:: Private residence Living Arrangements: Other relatives Available Help at Discharge: Family;Available 24 hours/day (sister and brother in law ) Type of Home: House Home Access: Stairs to enter [employer] of Steps: 1 Entrance Stairs-Rails: Right;Left;Can reach both Home Layout: One level     Bathroom Shower/Tub: Occupational psychologist: Handicapped height Bathroom Accessibility: Yes   Home Equipment: Environmental consultant - 2 wheels;Walker - 4 wheels;Cane - single point;Bedside commode;Shower seat;Grab bars - toilet;Grab bars - tub/shower;Adaptive equipment Adaptive Equipment: Reacher;Long-handled shoe horn;Long-handled sponge        Prior Functioning/Environment Level of Independence: Independent with assistive device(s)             OT Diagnosis: Generalized weakness;Acute pain   OT Problem List: Decreased strength;Decreased activity tolerance;Impaired balance (sitting and/or standing);Decreased safety awareness;Decreased knowledge of precautions;Obesity;Pain   OT Treatment/Interventions: Self-care/ADL training;DME and/or AE instruction;Therapeutic activities;Patient/family education;Balance training    OT Goals(Current goals can be found in the care plan section) Acute Rehab OT Goals Patient Stated Goal: to go home  OT Goal Formulation: With patient Time For Goal Achievement: 08/19/15 Potential to Achieve Goals: Good ADL Goals Pt Will Perform Grooming: with supervision;standing Pt Will Perform Lower Body Bathing: with supervision;sit to/from stand;with adaptive equipment Pt Will Perform Lower Body Dressing: with supervision;with adaptive equipment;sit to/from stand Pt Will Transfer to Toilet: with supervision;ambulating;grab bars;regular height toilet Pt Will Perform Toileting - Clothing  Manipulation and hygiene: with supervision;sit to/from stand Pt Will Perform Tub/Shower Transfer: Shower transfer;with min guard assist;ambulating;shower seat;rolling walker;grab bars  OT Frequency: Min 2X/week   Barriers to D/C:            Co-evaluation              End of Session Equipment Utilized During Treatment: Surveyor, mining Communication: Mobility status;Patient requests pain meds  Activity Tolerance: Patient limited by pain Patient left: in chair;with call bell/phone within reach   Time: 0842-0908 OT Time Calculation (min): 26 min Charges:  OT General Charges $OT Visit: 1 Procedure OT Evaluation $OT Eval Moderate Complexity: 1 Procedure OT Treatments $Self Care/Home Management : 8-22 mins G-Codes: OT G-codes **NOT FOR INPATIENT CLASS** Functional Limitation: Self care Self Care Current Status [id number]): At least 20 percent but less than 40 percent impaired, limited or restricted Self Care Goal Status [id number]): At least 1 percent but less than 20 percent impaired, limited or restricted  Francois Elk M 08/12/2015, 9:26 AM

## 2015-08-13 ENCOUNTER — Encounter: Payer: Self-pay | Admitting: General Surgery

## 2015-08-13 DIAGNOSIS — K430 Incisional hernia with obstruction, without gangrene: Secondary | ICD-10-CM | POA: Diagnosis not present

## 2015-08-13 DIAGNOSIS — E785 Hyperlipidemia, unspecified: Secondary | ICD-10-CM | POA: Diagnosis not present

## 2015-08-13 DIAGNOSIS — I700 Atherosclerosis of aorta: Secondary | ICD-10-CM | POA: Diagnosis not present

## 2015-08-13 DIAGNOSIS — I4891 Unspecified atrial fibrillation: Secondary | ICD-10-CM | POA: Diagnosis not present

## 2015-08-13 DIAGNOSIS — K660 Peritoneal adhesions (postprocedural) (postinfection): Secondary | ICD-10-CM | POA: Diagnosis not present

## 2015-08-13 DIAGNOSIS — G629 Polyneuropathy, unspecified: Secondary | ICD-10-CM | POA: Diagnosis not present

## 2015-08-13 NOTE — Progress Notes (Signed)
Physical Therapy Treatment Patient Details Name: Terry Martinez MRN: [id number] DOB: 1940/07/08 Today's Date: 08/13/2015    History of Present Illness This 75 y.o. male admitted for repair with mesh of incarcerated hernia.  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    PT Comments    Improvement in stability and safety awareness this am.  Pt eager for return home  Follow Up Recommendations  No PT follow up     Equipment Recommendations  None recommended by PT    Recommendations for Other Services OT consult     Precautions / Restrictions Precautions Precautions: Fall Precaution Comments: abdominal binder  and JP drain on L Restrictions Weight Bearing Restrictions: No    Mobility  Bed Mobility               General bed mobility comments: NT - pt declines to attempt but reports proper technique (log roll like after previous back surg)  Transfers Overall transfer level: Needs assistance Equipment used: None Transfers: Sit to/from Stand Sit to Stand: Supervision         General transfer comment: Increased stability from last session including completeing dressing in standing with no loss of balance  Ambulation/Gait Ambulation/Gait assistance: Min guard;Supervision Ambulation Distance (Feet): 180 Feet Assistive device: Rolling walker (2 wheeled) Gait Pattern/deviations: Step-through pattern;Decreased step length - right;Decreased step length - left;Shuffle;Trunk flexed Gait velocity: decr Gait velocity interpretation: Below normal speed for age/gender General Gait Details: min cues for position from RW - noted increased stability vs last session esp on turns   Stairs Stairs: Yes Stairs assistance: Min guard Stair Management: No rails;Step to pattern;Forwards;With walker Number of Stairs: 1 General stair comments: min cues  for foot placement  Wheelchair Mobility    Modified Rankin (Stroke Patients Only)       Balance Overall balance assessment: Needs assistance Sitting-balance support: Feet supported;No upper extremity supported Sitting balance-Leahy Scale: Good     Standing balance support: No upper extremity supported Standing balance-Leahy Scale: Good                      Cognition Arousal/Alertness: Awake/alert Behavior During Therapy: WFL for tasks assessed/performed Overall Cognitive Status: Within Functional Limits for tasks assessed                      Exercises      General Comments        Pertinent Vitals/Pain Pain Assessment: 0-10 Pain Score: 5  Pain Location: abdomen Pain Descriptors / Indicators: Sore Pain Intervention(s): Limited activity within patient's tolerance;Monitored during session    Home Living                      Prior Function            PT Goals (current goals can now be found in the care plan section) Acute Rehab PT Goals Patient Stated Goal: to go home  PT Goal Formulation: With patient Time For Goal Achievement: 08/16/15 Potential to Achieve Goals: Good Progress towards PT goals: Progressing toward goals    Frequency  Min 3X/week    PT Plan Current plan remains appropriate    Co-evaluation             End of Session   Activity Tolerance: Patient tolerated treatment well Patient left: in chair;with call  bell/phone within reach     Time: 0815-0827 PT Time Calculation (min) (ACUTE ONLY): 12 min  Charges:  $Gait Training: 8-22 mins                    G Codes:      Sean Malinowski September 01, 2015, 8:46 AM

## 2015-08-13 NOTE — Discharge Instructions (Signed)
DRAIN CARE:   You have a closed bulb drain to help you heal.    A bulb drain is a small, plastic reservoir which creates a gentle suction. It is used to remove excess fluid from a surgical wound. The color and amount of fluid will vary. Immediately after surgery, the fluid is bright red. It may gradually change to a yellow color. When the amount decreases to about 1 or 2 tablespoons (15 to 30 cc) per 24 hours, your caregiver will usually remove it.  JP Care  The Jackson-Pratt drainage system has flexible tubing attached to a soft, plastic bulb with a stopper. The drainage end of the tubing, which is flat and white, goes into your body through a small opening near your incision (surgical cut). A stitch holds the drainage end in place. The rest of the tube is outside your body, attached to the bulb. When the bulb is compressed with the stopper in place, it creates a vacuum. This causes a constant gentle suction, which helps draw out fluid that collects under your incision. The bulb should be compressed at all times, except when you are emptying the drainage.  How long you will have your Jackson-Pratt depends on your surgery and the amount of fluid is draining. This is different for everyone. The Jackson-Pratt is usually removed when the drainage is 30 mL or less over 24 hours. To keep track of how much drainage youre having, you will record the amount in a drainage log. Its important to bring the log with you to your follow-up appointments.  Caring for Your Jackson-Pratt at Home In order to care for your Jackson-Pratt at home, you or your caregiver will do the following:  Empty the drain once a day and record the color and amount of drainage  Care for the area where the tubing enters your skin by washing with soap and water.  Milk the tubing to help move clots into the bulb.  Do this before you empty and measure your drainage. Look in the mirror at the tubing. This will help you see where your  hands need to be. Pinch the tubing close to where it goes into your skin between your thumb and forefinger. With the thumb and forefinger of your other hand, pinch the tubing right below your other fingers. Keep your fingers pinched and slide them down the tubing, pushing any clots down toward the bulb. You may want to use alcohol swabs to help you slide your fingers down the tubing. Repeat steps 3 and 4 as necessary to push clots from the tubing into the bulb. If you are not able to move a clot into the bulb, call your doctors office. The fluid may leak around the insertion site if a clot is blocking the drainage flow. If there is fluid in the bulb and no leakage at the insertion site, the drain is working.  How to Empty Your Jackson-Pratt and Record the Drainage You will need to empty your Jackson-Pratt every day  Gather the following supplies:  Measuring container your nurse gave you Jackson-Pratt Drainage Record  Pen or pencil  Instructions Clean an area to work on. Clean your hands thoroughly. Unplug the stopper on top of your Jackson-Pratt. This will cause the bulb to expand. Do not touch the inside of the stopper or the inner area of the opening on the bulb. Turn your Jackson-Pratt upside down, gently squeeze the bulb, and pour the drainage into the measuring container. Turn your Jackson-Pratt right  side up. Squeeze the bulb until your fingers feel the palm of your hand. Keep squeezing the bulb while you replug the stopper. Make sure the bulb stays fully compressed to ensure constant, gentle suction.    Check the amount and color of drainage in the measuring container. The first couple days after surgery the fluid may be dark red. This is normal. As you heal the fluid may look pink or pale yellow. Record this amount and the color of drainage on your Jackson-Pratt Drainage Record. Flush the drainage down the toilet and rinse the measuring container with water.  Caring for the  Insertion Site Once you have emptied the drainage, clean your hands again. Check the area around the insertion site. Look for tenderness, swelling, or pus. If you have any of these, or if you have a temperature of 101 F (38.3 C) or higher, you may have an infection. Call your doctors office.  Sometimes, the drain causes redness the size of a dime at your insertion site. This is normal. Your healthcare provider will tell you if you should place a bandage over the insertion site.      DAILY CARE  Keep the bulb compressed at all times, except while emptying it. The compression creates suction.   Keep sites where the tubes enter the skin dry and covered with a light bandage (dressing).   Tape the tubes to your skin, 1 to 2 inches below the insertion sites, to keep from pulling on your stitches. Tubes are stitched in place and will not slip out.   Pin the bulb to your shirt (not to your pants) with a safety pin.   For the first few days after surgery, there usually is more fluid in the bulb. Empty the bulb whenever it becomes half full because the bulb does not create enough suction if it is too full. Include this amount in your 24 hour totals.   When the amount of drainage decreases, empty the bulb at the same time every day. Write down the amounts and the 24 hour totals. Your caregiver will want to know them. This helps your caregiver know when the tubes can be removed.   (We anticipate removing the drain in 1-3 weeks, depending on when the output is <22m a day for 2+ days)  If there is drainage around the tube sites, change dressings and keep the area dry. If you see a clot in the tube, leave it alone. However, if the tube does not appear to be draining, let your caregiver know.  TO EMPTY THE BULB  Open the stopper to release suction.   Holding the stopper out of the way, pour drainage into the measuring cup that was sent home with you.   Measure and write down the amount. If there  are 2 bulbs, note the amount of drainage from bulb 1 or bulb 2 and keep the totals separate. Your caregiver will want to know which tube is draining more.   Compress the bulb by folding it in half.   Replace the stopper.   Check the tape that holds the tube to your skin, and pin the bulb to your shirt.  SEEK MEDICAL CARE IF:  The drainage develops a bad odor.   You have an oral temperature above 102 F (38.9 C).   The amount of drainage from your wound suddenly increases or decreases.   You accidentally pull out your drain.   You have any other questions or concerns.  MAKE  SURE YOU:   Understand these instructions.   Will watch your condition.   Will get help right away if you are not doing well or get worse.     Call our office if you have any questions about your drain. [phone]    HERNIA REPAIR: POST OP INSTRUCTIONS  ######################################################################  EAT Gradually transition to a high fiber diet with a fiber supplement over the next few weeks after discharge.  Start with a pureed / full liquid diet (see below)  WALK Walk an hour a day.  Control your pain to do that.    CONTROL PAIN Control pain so that you can walk, sleep, tolerate sneezing/coughing, go up/down stairs.  HAVE A BOWEL MOVEMENT DAILY Keep your bowels regular to avoid problems.  OK to try a laxative to override constipation.  OK to use an antidairrheal to slow down diarrhea.  Call if not better after 2 tries  CALL IF YOU HAVE PROBLEMS/CONCERNS Call if you are still struggling despite following these instructions. Call if you have concerns not answered by these instructions  ######################################################################    1. DIET: Follow a light bland diet the first 24 hours after arrival home, such as soup, liquids, crackers, etc.  Be sure to include lots of fluids daily.  Avoid fast food or heavy meals as your are more likely  to get nauseated.  Eat a low fat the next few days after surgery. 2. Take your usually prescribed home medications unless otherwise directed. 3. PAIN CONTROL: a. Pain is best controlled by a usual combination of three different methods TOGETHER: i. Ice/Heat ii. Over the counter pain medication iii. Prescription pain medication b. Most patients will experience some swelling and bruising around the hernia(s) such as the bellybutton, groins, or old incisions.  Ice packs or heating pads (30-60 minutes up to 6 times a day) will help. Use ice for the first few days to help decrease swelling and bruising, then switch to heat to help relax tight/sore spots and speed recovery.  Some people prefer to use ice alone, heat alone, alternating between ice & heat.  Experiment to what works for you.  Swelling and bruising can take several weeks to resolve.   c. It is helpful to take an over-the-counter pain medication regularly for the first few weeks.  Choose one of the following that works best for you: i. Naproxen (Aleve, etc)  Two 220mg  tabs twice a day ii. Ibuprofen (Advil, etc) Three 200mg  tabs four times a day (every meal & bedtime) iii. Acetaminophen (Tylenol, etc) 325-650mg  four times a day (every meal & bedtime) d. A  prescription for pain medication should be given to you upon discharge.  Take your pain medication as prescribed.  i. If you are having problems/concerns with the prescription medicine (does not control pain, nausea, vomiting, rash, itching, etc), please call us [phone] to see if we need to switch you to a different pain medicine that will work better for you and/or control your side effect better. ii. If you need a refill on your pain medication, please contact your pharmacy.  They will contact our office to request authorization. Prescriptions will not be filled after 5 pm or on week-ends. 4. Avoid getting constipated.  Between the surgery and the pain medications, it is common to  experience some constipation.  Increasing fluid intake and taking a fiber supplement (such as Metamucil, Citrucel, FiberCon, MiraLax, etc) 1-2 times a day regularly will usually help prevent this problem from occurring.  A mild laxative (prune juice, Milk of Magnesia, MiraLax, etc) should be taken according to package directions if there are no bowel movements after 48 hours.   5. Wash / shower every day.  You may shower over the dressings as they are waterproof.   6. Remove your waterproof bandages 5 days after surgery.  You may leave the incision open to air.  You may replace a dressing/Band-Aid to cover the incision for comfort if you wish.  Continue to shower over incision(s) after the dressing is off.    7. ACTIVITIES as tolerated:   a. You may resume regular (light) daily activities beginning the next day--such as daily self-care, walking, climbing stairs--gradually increasing activities as tolerated.  If you can walk 30 minutes without difficulty, it is safe to try more intense activity such as jogging, treadmill, bicycling, low-impact aerobics, swimming, etc. b. Save the most intensive and strenuous activity for last such as sit-ups, heavy lifting, contact sports, etc  Refrain from any heavy lifting or straining until you are off narcotics for pain control.   c. DO NOT PUSH THROUGH PAIN.  Let pain be your guide: If it hurts to do something, don't do it.  Pain is your body warning you to avoid that activity for another week until the pain goes down. d. You may drive when you are no longer taking prescription pain medication, you can comfortably wear a seatbelt, and you can safely maneuver your car and apply brakes. e. Dennis Bast may have sexual intercourse when it is comfortable.  8. FOLLOW UP in our office a. Please call [facility] at [phone] to set up an appointment to see your surgeon in the office for a follow-up appointment approximately 2-3 weeks after your surgery. b. Make sure that you call for  this appointment the day you arrive home to insure a convenient appointment time. 9.  IF YOU HAVE DISABILITY OR FAMILY LEAVE FORMS, BRING THEM TO THE OFFICE FOR PROCESSING.  DO NOT GIVE THEM TO YOUR DOCTOR.  WHEN TO CALL us [phone]: 1. Poor pain control 2. Reactions / problems with new medications (rash/itching, nausea, etc)  3. Fever over 101.5 F (38.5 C) 4. Inability to urinate 5. Nausea and/or vomiting 6. Worsening swelling or bruising 7. Continued bleeding from incision. 8. Increased pain, redness, or drainage from the incision   The clinic staff is available to answer your questions during regular business hours (8:30am-5pm).  Please dont hesitate to call and ask to speak to one of our nurses for clinical concerns.   If you have a medical emergency, go to the nearest emergency room or call 911.  A surgeon from [facility] is always on call at the hospitals in [facility], [place], [place], [place], [place]  [address] ?  [address] [address], [place], [place]   [address] MAIN: [phone] ? TOLL FREE: [phone] ? FAX: [phone] [email or web address]  GETTING TO GOOD BOWEL HEALTH.  ######################################################################  EAT Gradually transition to a high fiber diet with a fiber supplement over the next few weeks after discharge.  Start with a pureed / full liquid diet (see below)  WALK Walk an hour a day.  Control your pain to do that.    HAVE A BOWEL MOVEMENT DAILY Keep your bowels regular to avoid problems.  OK to try a laxative to override constipation.  OK to use an antidairrheal to slow down diarrhea.  Call if not better after 2 tries  CALL IF YOU HAVE PROBLEMS/CONCERNS Call if you are still struggling despite following these instructions. Call if you have concerns not answered by these  instructions  ######################################################################   Irregular bowel habits such as constipation and diarrhea can lead to many problems over time.  Having one soft bowel movement a day is the most important way to prevent further problems.  The anorectal canal is designed to handle stretching and feces to safely manage our ability to get rid of solid waste (feces, poop, stool) out of our body.  BUT, hard constipated stools can act like ripping concrete bricks and diarrhea can be a burning fire to this very sensitive area of our body, causing inflamed hemorrhoids, anal fissures, increasing risk is perirectal abscesses, abdominal pain/bloating, an making irritable bowel worse.      The goal: ONE SOFT BOWEL MOVEMENT A DAY!  To have soft, regular bowel movements:   Drink plenty of fluids, consider 4-6 tall glasses of water a day.    Take plenty of fiber.  Fiber is the undigested part of plant food that passes into the colon, acting s natures broom to encourage bowel motility and movement.  Fiber can absorb and hold large amounts of water. This results in a larger, bulkier stool, which is soft and easier to pass. Work gradually over several weeks up to 6 servings a day of fiber (25g a day even more if needed) in the form of: o Vegetables -- Root (potatoes, carrots, turnips), leafy green (lettuce, salad greens, celery, spinach), or cooked high residue (cabbage, broccoli, etc) o Fruit -- Fresh (unpeeled skin & pulp), Dried (prunes, apricots, cherries, etc ),  or stewed ( applesauce)  o Whole grain breads, pasta, etc (whole wheat)  o Bran cereals   Bulking Agents -- This type of water-retaining fiber generally is easily obtained each day by one of the following:  o Psyllium bran -- The psyllium plant is remarkable because its ground seeds can retain so much water. This product is available as Metamucil, Konsyl, Effersyllium, Per Diem Fiber, or the less expensive generic  preparation in drug and health food stores. Although labeled a laxative, it really is not a laxative.  o Methylcellulose -- This is another fiber derived from wood which also retains water. It is available as Citrucel. o Polyethylene Glycol - and artificial fiber commonly called Miralax or Glycolax.  It is helpful for people with gassy or bloated feelings with regular fiber o Flax Seed - a less gassy fiber than psyllium  No reading or other relaxing activity while on the toilet. If bowel movements take longer than 5 minutes, you are too constipated  AVOID CONSTIPATION.  High fiber and water intake usually takes care of this.  Sometimes a laxative is needed to stimulate more frequent bowel movements, but   Laxatives are not a good long-term solution as it can wear the colon out.  They can help jump-start bowels if constipated, but should be relied on constantly without discussing with your doctor o Osmotics (Milk of Magnesia, Fleets phosphosoda, Magnesium citrate, MiraLax, GoLytely) are safer than  o Stimulants (Senokot, Castor Oil, Dulcolax, Ex Lax)    o Avoid taking laxatives for more than 7 days in a row.   IF SEVERELY CONSTIPATED, try a Bowel Retraining Program: o Do not use laxatives.  o Eat a diet high in roughage, such as bran cereals and leafy vegetables.  o Drink six (6) ounces of prune or apricot juice each morning.  o Eat two (  2) large servings of stewed fruit each day.  o Take one (1) heaping tablespoon of a psyllium-based bulking agent twice a day. Use sugar-free sweetener when possible to avoid excessive calories.  o Eat a normal breakfast.  o Set aside 15 minutes after breakfast to sit on the toilet, but do not strain to have a bowel movement.  o If you do not have a bowel movement by the third day, use an enema and repeat the above steps.   Controlling diarrhea o Switch to liquids and simpler foods for a few days to avoid stressing your intestines further. o Avoid dairy  products (especially milk & ice cream) for a short time.  The intestines often can lose the ability to digest lactose when stressed. o Avoid foods that cause gassiness or bloating.  Typical foods include beans and other legumes, cabbage, broccoli, and dairy foods.  Every person has some sensitivity to other foods, so listen to our body and avoid those foods that trigger problems for you. o Adding fiber (Citrucel, Metamucil, psyllium, Miralax) gradually can help thicken stools by absorbing excess fluid and retrain the intestines to act more normally.  Slowly increase the dose over a few weeks.  Too much fiber too soon can backfire and cause cramping & bloating. o Probiotics (such as active yogurt, Align, etc) may help repopulate the intestines and colon with normal bacteria and calm down a sensitive digestive tract.  Most studies show it to be of mild help, though, and such products can be costly. o Medicines: - Bismuth subsalicylate (ex. Kayopectate, Pepto Bismol) every 30 minutes for up to 6 doses can help control diarrhea.  Avoid if pregnant. - Loperamide (Immodium) can slow down diarrhea.  Start with two tablets (4mg  total) first and then try one tablet every 6 hours.  Avoid if you are having fevers or severe pain.  If you are not better or start feeling worse, stop all medicines and call your doctor for advice o Call your doctor if you are getting worse or not better.  Sometimes further testing (cultures, endoscopy, X-ray studies, bloodwork, etc) may be needed to help diagnose and treat the cause of the diarrhea.  TROUBLESHOOTING IRREGULAR BOWELS 1) Avoid extremes of bowel movements (no bad constipation/diarrhea) 2) Miralax 17gm mixed in 8oz. water or juice-daily. May use BID as needed.  3) Gas-x,Phazyme, etc. as needed for gas & bloating.  4) Soft,bland diet. No spicy,greasy,fried foods.  5) Prilosec over-the-counter as needed  6) May hold gluten/wheat products from diet to see if symptoms  improve.  7)  May try probiotics (Align, Activa, etc) to help calm the bowels down 7) If symptoms become worse call back immediately.

## 2015-08-13 NOTE — Progress Notes (Signed)
He was discharged by Dr. Johney Maine today and his caretaker called and said they cannot get him out of the bed.  He was sitting in a lift chair, went to the bathroom, then decided to lie flat on the bed.  He is now unable to get out of the bed because it is too painful.  He was getting OOB and ambulating according to the hospital notes.  He also has drains in and the caretaker and his wife were unfamiliar with drain care.  I advised the caretaker to call EMS and get him back into the chair or they could bring him back to the hospital for readmission.  We will call [facility] to check on him tomorrow if he remains out of the hospital.

## 2015-08-13 NOTE — Progress Notes (Signed)
Discharge planning, spoke with patient at beside. Physician has requested safety evaluation at home, patient agrees. Chose [facility] for [facility] services, has used them in the past, contacted [facility] for referral. Has RW and 3-n-1. [phone]

## 2015-08-13 NOTE — Progress Notes (Signed)
Discharge instructions given to patient with specific instructions gone over on the maintenance of his JP drain. Dressings given to change around insertions site.

## 2015-08-13 NOTE — Discharge Summary (Signed)
Physician Discharge Summary  Patient ID: Terry Martinez MRN: [id number] DOB/AGE: Jul 12, 1940 75 y.o.  Admit date: 08/11/2015 Discharge date: 08/13/2015  Patient Care Team: [name], MD as PCP - General (Family Medicine) Newt Minion, MD as Consulting Physician (Orthopedic Surgery) Fay Records, MD as Consulting Physician (Cardiology) Michael Boston, MD as Consulting Physician (General Surgery) Angelia Mould, MD as Consulting Physician (Vascular Surgery)  Admission Diagnoses: Principal Problem:   Incarcerated incisional hernia s/p lap LOA & repair with mesh 08/11/2015 Active Problems:   PAD (peripheral artery disease) (HCC)   HTN (hypertension)   Atrial fibrillation ([place])   HLD (hyperlipidemia)   Gout   Long term current use of anticoagulant therapy   Obesity   S/P Left axillobifemoral bypass graft   Discharge Diagnoses:  Principal Problem:   Incarcerated incisional hernia s/p lap LOA & repair with mesh 08/11/2015 Active Problems:   PAD (peripheral artery disease) ([place])   HTN (hypertension)   Atrial fibrillation ([place])   HLD (hyperlipidemia)   Gout   Long term current use of anticoagulant therapy   Obesity   S/P Left axillobifemoral bypass graft   POST-OPERATIVE DIAGNOSIS:   incisional wall hernias  SURGERY:  08/11/2015  Procedure(s): LAPAROSCOPIC LYSIS OF ADHESIONS LAPAROSCOPIC ASSISTED VENTRAL WALL HERNIA REPAIR with mesh  SURGEON:    Surgeon(s): Michael Boston, MD  Consults: None  Hospital Course:   The patient underwent the surgery above.  Postoperatively, the patient gradually mobilized and advanced to a solid diet.  Pain and other symptoms were treated aggressively.    By the time of discharge, the patient was walking well the hallways, eating food, having flatus.  Pain was well-controlled on an oral medications.  Based on meeting discharge criteria and continuing to recover, I felt it was safe for the patient to be discharged from the  hospital to further recover with close followup. Postoperative recommendations were discussed in detail.  They are written as well.   Significant Diagnostic Studies:  Results for orders placed or performed during the hospital encounter of 08/11/15 (from the past 72 hour(s))  PT- INR Day of Surgery     Status: Abnormal   Collection Time: 08/11/15  6:30 AM  Result Value Ref Range   Prothrombin Time 15.4 (H) 11.6 - 15.2 seconds   INR 1.21 0.00 - 0.10  Basic metabolic panel     Status: Abnormal   Collection Time: 08/12/15  8:14 AM  Result Value Ref Range   Sodium 135 135 - 145 mmol/L   Potassium 4.9 3.5 - 5.1 mmol/L   Chloride 102 101 - 111 mmol/L   CO2 25 22 - 32 mmol/L   Glucose, Bld 133 (H) 65 - 99 mg/dL   BUN 28 (H) 6 - 20 mg/dL   Creatinine, Ser 1.33 (H) 0.61 - 1.24 mg/dL   Calcium 8.7 (L) 8.9 - 10.3 mg/dL   GFR calc non Af Amer 51 (L) >60 mL/min   GFR calc Af Amer 59 (L) >60 mL/min    Comment: (NOTE) The eGFR has been calculated using the CKD EPI equation. This calculation has not been validated in all clinical situations. eGFR's persistently <60 mL/min signify possible Chronic Kidney Disease.    Anion gap 8 5 - 15  Hemoglobin     Status: Abnormal   Collection Time: 08/12/15  8:14 AM  Result Value Ref Range   Hemoglobin 12.2 (L) 13.0 - 17.0 g/dL    No results found.  Discharge Exam: Blood pressure 149/71, pulse  68, temperature 97 F (36.1 C), temperature source Oral, resp. rate 18, height _0  (1.6 m), weight 127.461 kg (281 lb), SpO2 95 %.  General: Pt awake/alert/oriented x4 in no major acute distress Eyes: PERRL, normal EOM. Sclera nonicteric Neuro: CN II-XII intact w/o focal sensory/motor deficits. Lymph: No head/neck/groin lymphadenopathy Psych:  No delerium/psychosis/paranoia HENT: Normocephalic, Mucus membranes moist.  No thrush Neck: Supple, No tracheal deviation Chest: No pain.  Good respiratory excursion. CV:  Pulses intact.  Regular rhythm MS: Normal  AROM mjr joints.  No obvious deformity Abdomen: Soft, Nondistended.  Min tender at incisions.  Drain serosang.  No incarcerated hernias. Ext:  SCDs BLE.  No significant edema.  No cyanosis Skin: No petechiae / purpura  Discharged Condition: good   Past Medical History  Diagnosis Date  . Hyperlipidemia   . Hypertension   . COPD (chronic obstructive pulmonary disease) ([place])   . Peripheral artery disease ([place])   . Atrial fibrillation ([place])   . Chronic pain   . Pneumonia   . Headache     occasional  . Cancer (HCC)     squamous cell carcinoma - left arm  . Anemia     hx low iron  . Myocardial infarction ([place])     3 stents  . Umbilical hernia   . Family history of adverse reaction to anesthesia     sister has difficulty waking up  . History of kidney stones   . Constipation   . DVT, lower extremity ([place])     many years  . Incisional hernia     x 2    Past Surgical History  Procedure Laterality Date  . Femoral-popliteal bypass graft    . Percutaneous coronary stent intervention (pci-s)    . Ankle surgery Left   . Eye surgery Bilateral     cataract surgery with lens implant  . Back surgery      2 titanium rods   . Tonsillectomy    . Colonoscopy    . Amputation Right 02/19/2014    Procedure: AMPUTATION RAY-RIGHT GREAT TOE;  Surgeon: Angelia Mould, MD;  Location: [place];  Service: Vascular;  Laterality: Right;  . Lower extremity angiogram N/A 05/04/2014    Procedure: LOWER EXTREMITY ANGIOGRAM;  Surgeon: Angelia Mould, MD;  Location: [facility] C[facility] LAB;  Service: Cardiovascular;  Laterality: N/A;  . Abdominal aortagram  05/04/2014    Procedure: ABDOMINAL AORTAGRAM;  Surgeon: Angelia Mould, MD;  Location[facility] CATH LAB;  Service: Cardiovascular;;  . Hernia repair      umbicial  . 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  Laparoscopic lysis of adhesions N/A 08/11/2015    Procedure: LAPAROSCOPIC LYSIS OF ADHESIONS;  Surgeon: Michael Boston, MD;  Location: [facility];  Service: General;  Laterality: N/A;  . Laparoscopic assisted ventral hernia repair N/A 08/11/2015    Procedure: LAPAROSCOPIC ASSISTED VENTRAL WALL HERNIA REPAIR with mesh;  Surgeon: Michael Boston, MD;  Location: [facility];  Service: General;  Laterality: N/A;    Social History   Social History  . Marital Status: Single    Spouse Name: N/A  . Number of Children: N/A  . Years of Education: N/A   Occupational History  . Not on file.   Social History Main Topics  . Smoking status: Former Smoker    Types: Cigarettes  Quit date: 07/22/1998  . Smokeless tobacco: Current User    Types: Snuff  . Alcohol Use: No  . Drug Use: No  . Sexual Activity: No   Other Topics Concern  . Not on file   Social History Narrative    Family History  Problem Relation Age of Onset  . Diabetes Mother   . Cancer Mother     Right Breast  . Heart disease Mother   . Hyperlipidemia Mother   . Hypertension Mother   . Lymphoma Mother     Chemo  . Cancer Father   . Cancer Brother   . Heart disease Brother   . Depression Brother   . Early death Brother   . Hyperlipidemia Brother   . Hypertension Brother   . Alcohol abuse Sister   . Heart disease Sister   . Hyperlipidemia Sister   . Hypertension Sister   . Stroke Sister   . Heart disease Maternal Grandmother   . Kidney disease Maternal Grandmother   . Heart disease Maternal Grandfather   . Kidney disease Maternal Grandfather     Current Facility-Administered Medications  Medication Dose Route Frequency Provider Last Rate Last Dose  . 0.9 %  sodium chloride infusion  250 mL Intravenous PRN Michael Boston, MD      . 0.9 %  sodium chloride infusion  250 mL Intravenous PRN Michael Boston, MD      . acetaminophen (TYLENOL) tablet 1,000 mg  1,000 mg Oral Q6H Michael Boston, MD   1,000 mg at 08/13/15 0546  . albuterol  (PROVENTIL) (2.5 MG/3ML) 0.083% nebulizer solution 3 mL  3 mL Inhalation Q6H PRN Michael Boston, MD      . allopurinol (ZYLOPRIM) tablet 100 mg  100 mg Oral QPM Michael Boston, MD   100 mg at 08/12/15 1701  . allopurinol (ZYLOPRIM) tablet 300 mg  300 mg Oral Daily Michael Boston, MD   300 mg at 08/12/15 4098  . bisacodyl (DULCOLAX) suppository 10 mg  10 mg Rectal Q12H PRN Michael Boston, MD   10 mg at 08/12/15 1702  . diphenhydrAMINE (BENADRYL) 12.5 MG/5ML elixir 12.5 mg  12.5 mg Oral Q6H PRN Michael Boston, MD       Or  . diphenhydrAMINE (BENADRYL) injection 12.5 mg  12.5 mg Intravenous Q6H PRN Michael Boston, MD      . enalapril (VASOTEC) tablet 10 mg  10 mg Oral Daily Michael Boston, MD   10 mg at 08/12/15 0924  . enoxaparin (LOVENOX) injection 40 mg  40 mg Subcutaneous Q24H Michael Boston, MD   40 mg at 08/12/15 0743  . EPINEPHrine (EPI-PEN) injection 0.3 mg  0.3 mg Intramuscular Once PRN Michael Boston, MD      . furosemide (LASIX) tablet 40 mg  40 mg Oral Daily Michael Boston, MD   40 mg at 08/12/15 1191  . hydrALAZINE (APRESOLINE) injection 10 mg  10 mg Intravenous Q2H PRN Michael Boston, MD      . HYDROmorphone (DILAUDID) injection 0.5-2 mg  0.5-2 mg Intravenous Q2H PRN Michael Boston, MD      . lactated ringers bolus 1,000 mL  1,000 mL Intravenous Q8H PRN Michael Boston, MD      . lactated ringers bolus 1,000 mL  1,000 mL Intravenous Q8H PRN Michael Boston, MD      . lip balm (CARMEX) ointment 1 application  1 application Topical BID Michael Boston, MD   1 application at 47/82/95 2149  . magic mouthwash  15 mL Oral QID  PRN Michael Boston, MD      . menthol-cetylpyridinium (CEPACOL) lozenge 3 mg  1 lozenge Oral PRN Michael Boston, MD      . methocarbamol (ROBAXIN) 1,000 mg in dextrose 5 % 50 mL IVPB  1,000 mg Intravenous Q6H PRN Michael Boston, MD      . methocarbamol (ROBAXIN) tablet 1,000 mg  1,000 mg Oral Q6H PRN Michael Boston, MD   1,000 mg at 08/12/15 2145  . metoprolol (LOPRESSOR) injection 5 mg  5 mg Intravenous Q6H  PRN Michael Boston, MD      . metoprolol tartrate (LOPRESSOR) tablet 12.5 mg  12.5 mg Oral BID Michael Boston, MD   12.5 mg at 08/12/15 2149  . nitroGLYCERIN (NITROSTAT) SL tablet 0.4 mg  0.4 mg Sublingual Q5 min PRN Michael Boston, MD      . ondansetron (ZOFRAN-ODT) disintegrating tablet 4 mg  4 mg Oral Q6H PRN Michael Boston, MD       Or  . ondansetron [facility]) injection 4 mg  4 mg Intravenous Q6H PRN Michael Boston, MD      . oxyCODONE (Oxy IR/ROXICODONE) immediate release tablet 5-10 mg  5-10 mg Oral Q4H PRN Michael Boston, MD   10 mg at 08/12/15 2146  . phenol (CHLORASEPTIC) mouth spray 2 spray  2 spray Mouth/Throat PRN Michael Boston, MD      . potassium chloride SA (K-DUR,KLOR-CON) CR tablet 20 mEq  20 mEq Oral Daily Michael Boston, MD   20 mEq at 08/12/15 0925  . prochlorperazine (COMPAZINE) injection 5-10 mg  5-10 mg Intravenous Q4H PRN Michael Boston, MD      . simethicone [facility]) chewable tablet 40 mg  40 mg Oral Q6H PRN Michael Boston, MD      . sodium chloride flush (NS) 0.9 % injection 3 mL  3 mL Intravenous Q12H Michael Boston, MD   3 mL at 08/11/15 2000  . sodium chloride flush (NS) 0.9 % injection 3 mL  3 mL Intravenous PRN Michael Boston, MD      . sodium chloride flush (NS) 0.9 % injection 3 mL  3 mL Intravenous Q12H Michael Boston, MD   3 mL at 08/12/15 2200  . sodium chloride flush (NS) 0.9 % injection 3 mL  3 mL Intravenous PRN Michael Boston, MD      . tamsulosin [facility]) capsule 0.4 mg  0.4 mg Oral Daily Michael Boston, MD   0.4 mg at 08/12/15 1610  . vitamin C (ASCORBIC ACID) tablet 500 mg  500 mg Oral Daily Michael Boston, MD   500 mg at 08/12/15 9604     Allergies  Allergen Reactions  . Zocor [Simvastatin] Hives    Disposition: 01-Home or Self Care  Discharge Instructions    Call MD for:  extreme fatigue    Complete by:  As directed      Call MD for:  hives    Complete by:  As directed      Call MD for:  persistant nausea and vomiting    Complete by:  As directed      Call MD for:   redness, tenderness, or signs of infection (pain, swelling, redness, odor or green/yellow discharge around incision site)    Complete by:  As directed      Call MD for:  severe uncontrolled pain    Complete by:  As directed      Call MD for:    Complete by:  As directed   Temperature > 101.22F  Diet - low sodium heart healthy    Complete by:  As directed   Start with bland, low residue diet for a few days, then advance to a heart healthy (low fat, high fiber) diet.  If you feel nauseated or constipated, simplify to a liquid only diet for 48 hours until you are feeling better (no more nausea, farting/passing gas, having a bowel movement, etc...).  If you cannot tolerate even drinking liquids, or feeling worse, let your surgeon know or go to the Emergency Department for help.     Discharge instructions    Complete by:  As directed   Please see discharge instruction sheets.   Also refer to any handouts/printouts that may have been given from the [facility] surgery office (if you visited [place] there before surgery) Please call our office if you have any questions or concerns [phone]     Discharge wound care:    Complete by:  As directed   If you have closed incisions: Shower and bathe over these incisions with soap and water every day.  It is OK to wash over the dressings: they are waterproof. Remove all surgical dressings on postoperative day #3.  You do not need to replace dressings over the closed incisions unless you feel more comfortable with a Band-Aid covering it.   If you have an open wound: That requires packing, so please see wound care instructions.   In general, remove all dressings, wash wound with soap and water and then replace with saline moistened gauze.  Do the dressing change at least every day.    Please call our office [phone] if you have further questions.     Driving Restrictions    Complete by:  As directed   No driving until off narcotics and can safely swerve away  without pain during an emergency     Increase activity slowly    Complete by:  As directed      Lifting restrictions    Complete by:  As directed   Avoid heavy lifting initially, <20 pounds at first.   Do not push through pain.   You have no specific weight limit: If it hurts to do, DON'T DO IT.    If you feel no pain, you are not injuring anything.  Pain will protect you from injury.   Coughing and sneezing are far more stressful to your incision than any lifting.   Avoid resuming heavy lifting (>50 pounds) or other intense activity until off all narcotic pain medications.   When want to exercise more, give yourself 2 weeks to gradually get back to full intense exercise/activity.     May shower / Bathe    Complete by:  As directed   [place].  It is fine for dressings or wounds to be washed/rinsed.  Use gentle soap & water.  This will help the incisions and/or wounds get clean & minimize infection.     Sexual Activity Restrictions    Complete by:  As directed   Sexual activity as tolerated.  Do not push through pain.  Pain will protect you from injury.     Walk with assistance    Complete by:  As directed   Walk over an hour a day.  May use a walker/cane/companion to help with balance and stamina.            Medication List    TAKE these medications        albuterol 108 (90 Base)  MCG/ACT inhaler  Commonly known as:  PROVENTIL HFA;VENTOLIN HFA  Inhale 2 puffs into the lungs every 6 (six) hours as needed for wheezing or shortness of breath.     allopurinol 100 MG tablet  Commonly known as:  ZYLOPRIM  TAKE ONE TABLET BY MOUTH ONCE DAILY IN THE EVENING     allopurinol 300 MG tablet  Commonly known as:  ZYLOPRIM  TAKE ONE TABLET BY MOUTH ONCE DAILY IN THE MORNING     enalapril 10 MG tablet  Commonly known as:  VASOTEC  TAKE ONE TABLET BY MOUTH ONCE DAILY     EPINEPHrine 0.3 mg/0.3 mL Soaj injection  Commonly known as:  EPI-PEN  Inject 0.3 mLs (0.3 mg total) into  the muscle once.     furosemide 40 MG tablet  Commonly known as:  LASIX  TAKE ONE TABLET BY MOUTH ONCE DAILY     lovastatin 20 MG tablet  Commonly known as:  MEVACOR  TAKE ONE TABLET BY MOUTH AT BEDTIME     MENS MULTIVITAMIN PLUS PO  Take 1 tablet by mouth daily.     methocarbamol 750 MG tablet  Commonly known as:  ROBAXIN  Take 1 tablet (750 mg total) by mouth 4 (four) times daily as needed (use for muscle cramps/pain).     metoprolol tartrate 25 MG tablet  Commonly known as:  LOPRESSOR  Take 0.5 tablets (12.5 mg total) by mouth 2 (two) times daily.     metroNIDAZOLE 1 % gel  Commonly known as:  METROGEL  Apply topically daily. For rosacea     nitroGLYCERIN 0.4 MG SL tablet  Commonly known as:  NITROSTAT  Place 1 tablet (0.4 mg total) under the tongue every 5 (five) minutes as needed for chest pain.     oxyCODONE 5 MG immediate release tablet  Commonly known as:  Oxy IR/ROXICODONE  Take 1-2 tablets (5-10 mg total) by mouth every 4 (four) hours as needed for moderate pain, severe pain or breakthrough pain.     polyethylene glycol powder powder  Commonly known as:  GLYCOLAX/MIRALAX  DISSOLVE 17G OF POWDER IN 8 OUNCES OF LIQUID AND DRINK 2 TIMES PER DAY AS NEEDED FOR MILD CONSTIPATION.     potassium chloride SA 20 MEQ tablet  Commonly known as:  KLOR-CON M20  Take 1 tablet (20 mEq total) by mouth daily.     tamsulosin 0.4 MG Caps capsule  Commonly known as:  FLOMAX  Take 0.4 mg by mouth daily.     triamcinolone ointment 0.5 %  Commonly known as:  KENALOG  Apply 1 application topically 2 (two) times daily as needed.     VITAMIN C PO  Take 1 tablet by mouth daily.     VITAMIN D-3 PO  Take 1 tablet by mouth daily.     warfarin 5 MG tablet  Commonly known as:  COUMADIN  Take 1 tablet (5 mg total) by mouth daily. Or as directed          Signed: Morton Peters, M.D., F.A.C.S. [facility] [address], [place], [place] [address] [phone] Main / Paging   08/13/2015, 7:13 AM

## 2015-08-14 ENCOUNTER — Telehealth: Payer: Self-pay | Admitting: General Surgery

## 2015-08-14 NOTE — Telephone Encounter (Signed)
Paged about needing assistance with empty blake drain.  I gave them the number for [facility] listed in the case manager notes.  We went over basic drain care and how to empty and recharge the system. They have a granddaughter on the way over who has experience in the medical setting. The patient was able to get help up after the issue lastnight  Terry Martinez, M.D. [facility] Pg: [phone]

## 2015-08-16 DIAGNOSIS — E785 Hyperlipidemia, unspecified: Secondary | ICD-10-CM | POA: Diagnosis not present

## 2015-08-16 DIAGNOSIS — I252 Old myocardial infarction: Secondary | ICD-10-CM | POA: Diagnosis not present

## 2015-08-16 DIAGNOSIS — Z89421 Acquired absence of other right toe(s): Secondary | ICD-10-CM | POA: Diagnosis not present

## 2015-08-16 DIAGNOSIS — G629 Polyneuropathy, unspecified: Secondary | ICD-10-CM | POA: Diagnosis not present

## 2015-08-16 DIAGNOSIS — Z951 Presence of aortocoronary bypass graft: Secondary | ICD-10-CM | POA: Diagnosis not present

## 2015-08-16 DIAGNOSIS — I10 Essential (primary) hypertension: Secondary | ICD-10-CM | POA: Diagnosis not present

## 2015-08-16 DIAGNOSIS — Z48815 Encounter for surgical aftercare following surgery on the digestive system: Secondary | ICD-10-CM | POA: Diagnosis not present

## 2015-08-16 DIAGNOSIS — I739 Peripheral vascular disease, unspecified: Secondary | ICD-10-CM | POA: Diagnosis not present

## 2015-08-16 DIAGNOSIS — E669 Obesity, unspecified: Secondary | ICD-10-CM | POA: Diagnosis not present

## 2015-08-16 DIAGNOSIS — M109 Gout, unspecified: Secondary | ICD-10-CM | POA: Diagnosis not present

## 2015-08-16 DIAGNOSIS — I4891 Unspecified atrial fibrillation: Secondary | ICD-10-CM | POA: Diagnosis not present

## 2015-08-17 ENCOUNTER — Telehealth: Payer: Self-pay | Admitting: *Deleted

## 2015-08-17 NOTE — Telephone Encounter (Signed)
Derrick (physical therapist from [facility]) is calling and requesting verbal orders for the following:  3 times a week for 2 weeks 2 times a week for 2 weeks  He is requesting a callback with verbal orders.  Laiden Milles, Salome Spotted, CMA

## 2015-08-18 DIAGNOSIS — G629 Polyneuropathy, unspecified: Secondary | ICD-10-CM | POA: Diagnosis not present

## 2015-08-18 DIAGNOSIS — I4891 Unspecified atrial fibrillation: Secondary | ICD-10-CM | POA: Diagnosis not present

## 2015-08-18 DIAGNOSIS — I252 Old myocardial infarction: Secondary | ICD-10-CM | POA: Diagnosis not present

## 2015-08-18 DIAGNOSIS — Z48815 Encounter for surgical aftercare following surgery on the digestive system: Secondary | ICD-10-CM | POA: Diagnosis not present

## 2015-08-18 DIAGNOSIS — I10 Essential (primary) hypertension: Secondary | ICD-10-CM | POA: Diagnosis not present

## 2015-08-18 DIAGNOSIS — I739 Peripheral vascular disease, unspecified: Secondary | ICD-10-CM | POA: Diagnosis not present

## 2015-08-18 NOTE — Telephone Encounter (Signed)
Called and left message for Montine Circle.  Verbal orders given as requested.  [name], MD, MPH PGY-2,  [facility] 08/18/2015 8:43 AM

## 2015-08-20 ENCOUNTER — Ambulatory Visit (INDEPENDENT_AMBULATORY_CARE_PROVIDER_SITE_OTHER): Payer: Medicare Other | Admitting: *Deleted

## 2015-08-20 DIAGNOSIS — I739 Peripheral vascular disease, unspecified: Secondary | ICD-10-CM | POA: Diagnosis not present

## 2015-08-20 DIAGNOSIS — I252 Old myocardial infarction: Secondary | ICD-10-CM | POA: Diagnosis not present

## 2015-08-20 DIAGNOSIS — I4891 Unspecified atrial fibrillation: Secondary | ICD-10-CM

## 2015-08-20 DIAGNOSIS — I10 Essential (primary) hypertension: Secondary | ICD-10-CM | POA: Diagnosis not present

## 2015-08-20 DIAGNOSIS — Z48815 Encounter for surgical aftercare following surgery on the digestive system: Secondary | ICD-10-CM | POA: Diagnosis not present

## 2015-08-20 DIAGNOSIS — G629 Polyneuropathy, unspecified: Secondary | ICD-10-CM | POA: Diagnosis not present

## 2015-08-20 LAB — POCT INR: INR: 1.7

## 2015-08-21 ENCOUNTER — Other Ambulatory Visit: Payer: Self-pay | Admitting: Family Medicine

## 2015-08-23 ENCOUNTER — Telehealth: Payer: Self-pay | Admitting: Family Medicine

## 2015-08-23 DIAGNOSIS — Z48815 Encounter for surgical aftercare following surgery on the digestive system: Secondary | ICD-10-CM | POA: Diagnosis not present

## 2015-08-23 DIAGNOSIS — G629 Polyneuropathy, unspecified: Secondary | ICD-10-CM | POA: Diagnosis not present

## 2015-08-23 DIAGNOSIS — I252 Old myocardial infarction: Secondary | ICD-10-CM | POA: Diagnosis not present

## 2015-08-23 DIAGNOSIS — I739 Peripheral vascular disease, unspecified: Secondary | ICD-10-CM | POA: Diagnosis not present

## 2015-08-23 DIAGNOSIS — I10 Essential (primary) hypertension: Secondary | ICD-10-CM | POA: Diagnosis not present

## 2015-08-23 DIAGNOSIS — I4891 Unspecified atrial fibrillation: Secondary | ICD-10-CM | POA: Diagnosis not present

## 2015-08-23 NOTE — Telephone Encounter (Signed)
Called patient to discuss request for pain medications.  Spoke to his sister (on file ok to speak to her).  Patient had ventral wall hernia repair 08/11/15.  She reports he has oxycodone left from surgery and has f/u appt on 08/25/15 with surgeon.  Advised ehr to discuss with patient and with surgeon if additional pain medications needed.  [name], MD, MPH PGY-3,  [facility] 08/23/2015 2:02 PM

## 2015-08-23 NOTE — Telephone Encounter (Signed)
-----   Message from Maryland Pink, [place] sent at 08/20/2015  3:16 PM EDT ----- Regarding: Pain med request Patient is requesting an Rx for pain medication post surgery. He will be here 08/27/15 for INR recheck and would like to know if it is possible to get a 30 - 60 supply at that visit. [place], Kevin Fenton

## 2015-08-23 NOTE — Telephone Encounter (Signed)
Looks like Dr. Lacinda Axon is no longer pt's PCP. Please advise on refill. Thank you.

## 2015-08-26 ENCOUNTER — Ambulatory Visit: Payer: Medicare Other

## 2015-08-26 DIAGNOSIS — I10 Essential (primary) hypertension: Secondary | ICD-10-CM | POA: Diagnosis not present

## 2015-08-26 DIAGNOSIS — I4891 Unspecified atrial fibrillation: Secondary | ICD-10-CM | POA: Diagnosis not present

## 2015-08-26 DIAGNOSIS — I252 Old myocardial infarction: Secondary | ICD-10-CM | POA: Diagnosis not present

## 2015-08-26 DIAGNOSIS — I739 Peripheral vascular disease, unspecified: Secondary | ICD-10-CM | POA: Diagnosis not present

## 2015-08-26 DIAGNOSIS — G629 Polyneuropathy, unspecified: Secondary | ICD-10-CM | POA: Diagnosis not present

## 2015-08-26 DIAGNOSIS — Z48815 Encounter for surgical aftercare following surgery on the digestive system: Secondary | ICD-10-CM | POA: Diagnosis not present

## 2015-08-26 NOTE — Telephone Encounter (Signed)
2nd request

## 2015-08-27 ENCOUNTER — Ambulatory Visit (INDEPENDENT_AMBULATORY_CARE_PROVIDER_SITE_OTHER): Payer: Medicare Other | Admitting: *Deleted

## 2015-08-27 DIAGNOSIS — I252 Old myocardial infarction: Secondary | ICD-10-CM | POA: Diagnosis not present

## 2015-08-27 DIAGNOSIS — G629 Polyneuropathy, unspecified: Secondary | ICD-10-CM | POA: Diagnosis not present

## 2015-08-27 DIAGNOSIS — I10 Essential (primary) hypertension: Secondary | ICD-10-CM | POA: Diagnosis not present

## 2015-08-27 DIAGNOSIS — I4891 Unspecified atrial fibrillation: Secondary | ICD-10-CM | POA: Diagnosis not present

## 2015-08-27 DIAGNOSIS — I739 Peripheral vascular disease, unspecified: Secondary | ICD-10-CM | POA: Diagnosis not present

## 2015-08-27 DIAGNOSIS — Z48815 Encounter for surgical aftercare following surgery on the digestive system: Secondary | ICD-10-CM | POA: Diagnosis not present

## 2015-08-27 LAB — POCT INR: INR: 2

## 2015-08-30 DIAGNOSIS — I252 Old myocardial infarction: Secondary | ICD-10-CM | POA: Diagnosis not present

## 2015-08-30 DIAGNOSIS — Z48815 Encounter for surgical aftercare following surgery on the digestive system: Secondary | ICD-10-CM | POA: Diagnosis not present

## 2015-08-30 DIAGNOSIS — I739 Peripheral vascular disease, unspecified: Secondary | ICD-10-CM | POA: Diagnosis not present

## 2015-08-30 DIAGNOSIS — I4891 Unspecified atrial fibrillation: Secondary | ICD-10-CM | POA: Diagnosis not present

## 2015-08-30 DIAGNOSIS — G629 Polyneuropathy, unspecified: Secondary | ICD-10-CM | POA: Diagnosis not present

## 2015-08-30 DIAGNOSIS — I10 Essential (primary) hypertension: Secondary | ICD-10-CM | POA: Diagnosis not present

## 2015-09-01 ENCOUNTER — Other Ambulatory Visit: Payer: Self-pay | Admitting: Family Medicine

## 2015-09-02 DIAGNOSIS — I739 Peripheral vascular disease, unspecified: Secondary | ICD-10-CM | POA: Diagnosis not present

## 2015-09-02 DIAGNOSIS — I252 Old myocardial infarction: Secondary | ICD-10-CM | POA: Diagnosis not present

## 2015-09-02 DIAGNOSIS — I10 Essential (primary) hypertension: Secondary | ICD-10-CM | POA: Diagnosis not present

## 2015-09-02 DIAGNOSIS — Z48815 Encounter for surgical aftercare following surgery on the digestive system: Secondary | ICD-10-CM | POA: Diagnosis not present

## 2015-09-02 DIAGNOSIS — G629 Polyneuropathy, unspecified: Secondary | ICD-10-CM | POA: Diagnosis not present

## 2015-09-02 DIAGNOSIS — I4891 Unspecified atrial fibrillation: Secondary | ICD-10-CM | POA: Diagnosis not present

## 2015-09-04 ENCOUNTER — Other Ambulatory Visit: Payer: Self-pay | Admitting: Family Medicine

## 2015-09-06 DIAGNOSIS — I10 Essential (primary) hypertension: Secondary | ICD-10-CM | POA: Diagnosis not present

## 2015-09-06 DIAGNOSIS — G629 Polyneuropathy, unspecified: Secondary | ICD-10-CM | POA: Diagnosis not present

## 2015-09-06 DIAGNOSIS — I739 Peripheral vascular disease, unspecified: Secondary | ICD-10-CM | POA: Diagnosis not present

## 2015-09-06 DIAGNOSIS — Z48815 Encounter for surgical aftercare following surgery on the digestive system: Secondary | ICD-10-CM | POA: Diagnosis not present

## 2015-09-06 DIAGNOSIS — I4891 Unspecified atrial fibrillation: Secondary | ICD-10-CM | POA: Diagnosis not present

## 2015-09-06 DIAGNOSIS — I252 Old myocardial infarction: Secondary | ICD-10-CM | POA: Diagnosis not present

## 2015-09-07 ENCOUNTER — Other Ambulatory Visit: Payer: Self-pay | Admitting: Family Medicine

## 2015-09-08 DIAGNOSIS — I739 Peripheral vascular disease, unspecified: Secondary | ICD-10-CM | POA: Diagnosis not present

## 2015-09-08 DIAGNOSIS — Z48815 Encounter for surgical aftercare following surgery on the digestive system: Secondary | ICD-10-CM | POA: Diagnosis not present

## 2015-09-08 DIAGNOSIS — I10 Essential (primary) hypertension: Secondary | ICD-10-CM | POA: Diagnosis not present

## 2015-09-08 DIAGNOSIS — I252 Old myocardial infarction: Secondary | ICD-10-CM | POA: Diagnosis not present

## 2015-09-08 DIAGNOSIS — G629 Polyneuropathy, unspecified: Secondary | ICD-10-CM | POA: Diagnosis not present

## 2015-09-08 DIAGNOSIS — I4891 Unspecified atrial fibrillation: Secondary | ICD-10-CM | POA: Diagnosis not present

## 2015-09-09 ENCOUNTER — Telehealth: Payer: Self-pay | Admitting: Family Medicine

## 2015-09-09 DIAGNOSIS — L97411 Non-pressure chronic ulcer of right heel and midfoot limited to breakdown of skin: Secondary | ICD-10-CM | POA: Diagnosis not present

## 2015-09-09 DIAGNOSIS — I70235 Atherosclerosis of native arteries of right leg with ulceration of other part of foot: Secondary | ICD-10-CM | POA: Diagnosis not present

## 2015-09-09 DIAGNOSIS — Z89411 Acquired absence of right great toe: Secondary | ICD-10-CM | POA: Diagnosis not present

## 2015-09-09 DIAGNOSIS — B351 Tinea unguium: Secondary | ICD-10-CM | POA: Diagnosis not present

## 2015-09-09 MED ORDER — ENALAPRIL MALEATE 10 MG PO TABS: 10.0000 mg | ORAL_TABLET | Freq: Every day | ORAL | Status: DC

## 2015-09-09 NOTE — Telephone Encounter (Signed)
Patient asks refill for enalapril 10 mg. Please, follow up.

## 2015-09-15 ENCOUNTER — Ambulatory Visit (INDEPENDENT_AMBULATORY_CARE_PROVIDER_SITE_OTHER): Payer: Medicare Other | Admitting: *Deleted

## 2015-09-15 DIAGNOSIS — I4891 Unspecified atrial fibrillation: Secondary | ICD-10-CM

## 2015-09-15 LAB — POCT INR: INR: 2.3

## 2015-09-16 ENCOUNTER — Telehealth: Payer: Self-pay | Admitting: *Deleted

## 2015-09-16 ENCOUNTER — Telehealth: Payer: Self-pay | Admitting: Family Medicine

## 2015-09-16 NOTE — Telephone Encounter (Signed)
Spoke with sister Chauncey Reading), who reports diabetic shoes were denied by insurance.  Explained to her that patient does not have diabetes and therefore does not qualify for that standpoint. He should qualify for shoes aced on his partial foot amputation. She will call and speak to podiatry about this further.  [name], MD, MPH PGY-3,  [facility] 09/16/2015 12:14 PM

## 2015-09-16 NOTE — Telephone Encounter (Signed)
-----   Message from Maryland Pink, [place] sent at 09/15/2015  9:42 AM EDT ----- Patient was here for his INR check today and says he was denied diabetic shoes and needs them. He would like someone to call him about this at [phone].

## 2015-09-16 NOTE — Telephone Encounter (Signed)
Patient called again in regards to getting diabetic shoes.  I explained again that because he is not diabetic we can not do shoes for him.  Medicare's shoe program is for diabetics who meet specific requirements and he does not meet the first requirement of being diabetic.  His PCP told him this as well today.  Unfortunately what he is requesting we nor any outside vendor such as [employer] can do.  I have spoken to [facility] and confirmed that they can not do with a diagnosis of PAD with amputations.  If he wants to do as a cash pay the charges for 1 pair of shoes and 3 sets of inserts with amputation fillers is a charge of $544.  If he decides he wants to do it that way to please call me back.  Patient stated understanding.

## 2015-09-17 ENCOUNTER — Ambulatory Visit: Payer: Medicare Other

## 2015-10-14 DIAGNOSIS — B351 Tinea unguium: Secondary | ICD-10-CM | POA: Diagnosis not present

## 2015-10-14 DIAGNOSIS — L97411 Non-pressure chronic ulcer of right heel and midfoot limited to breakdown of skin: Secondary | ICD-10-CM | POA: Diagnosis not present

## 2015-10-14 DIAGNOSIS — I70235 Atherosclerosis of native arteries of right leg with ulceration of other part of foot: Secondary | ICD-10-CM | POA: Diagnosis not present

## 2015-10-14 DIAGNOSIS — Z89411 Acquired absence of right great toe: Secondary | ICD-10-CM | POA: Diagnosis not present

## 2015-10-18 ENCOUNTER — Ambulatory Visit: Payer: Medicare Other

## 2015-10-21 ENCOUNTER — Encounter (HOSPITAL_COMMUNITY): Payer: Self-pay

## 2015-10-22 ENCOUNTER — Ambulatory Visit (INDEPENDENT_AMBULATORY_CARE_PROVIDER_SITE_OTHER): Payer: Medicare Other | Admitting: *Deleted

## 2015-10-22 DIAGNOSIS — Z23 Encounter for immunization: Secondary | ICD-10-CM | POA: Diagnosis present

## 2015-10-22 DIAGNOSIS — I4891 Unspecified atrial fibrillation: Secondary | ICD-10-CM | POA: Diagnosis not present

## 2015-10-22 LAB — POCT INR: INR: 3.5

## 2015-11-05 ENCOUNTER — Ambulatory Visit (INDEPENDENT_AMBULATORY_CARE_PROVIDER_SITE_OTHER): Payer: Medicare Other | Admitting: *Deleted

## 2015-11-05 DIAGNOSIS — I4891 Unspecified atrial fibrillation: Secondary | ICD-10-CM | POA: Diagnosis not present

## 2015-11-05 LAB — POCT INR: INR: 2.6

## 2015-11-16 ENCOUNTER — Telehealth: Payer: Self-pay | Admitting: *Deleted

## 2015-11-16 ENCOUNTER — Telehealth: Payer: Self-pay | Admitting: Family Medicine

## 2015-11-16 ENCOUNTER — Encounter: Payer: Self-pay | Admitting: Student

## 2015-11-16 ENCOUNTER — Ambulatory Visit (INDEPENDENT_AMBULATORY_CARE_PROVIDER_SITE_OTHER): Payer: Medicare Other | Admitting: Student

## 2015-11-16 VITALS — BP 142/60 | HR 67 | Temp 97.6°F | Ht 75.0 in | Wt 288.6 lb

## 2015-11-16 DIAGNOSIS — R05 Cough: Secondary | ICD-10-CM | POA: Diagnosis not present

## 2015-11-16 DIAGNOSIS — I70209 Unspecified atherosclerosis of native arteries of extremities, unspecified extremity: Secondary | ICD-10-CM

## 2015-11-16 DIAGNOSIS — R059 Cough, unspecified: Secondary | ICD-10-CM

## 2015-11-16 DIAGNOSIS — Z85828 Personal history of other malignant neoplasm of skin: Secondary | ICD-10-CM

## 2015-11-16 MED ORDER — BENZONATATE 100 MG PO CAPS: 100.0000 mg | ORAL_CAPSULE | Freq: Two times a day (BID) | ORAL | 0 refills | Status: DC | PRN

## 2015-11-16 NOTE — Telephone Encounter (Signed)
Will forward to Dr. Cyndia Skeeters, since he saw patient this morning.  Derl Barrow, RN

## 2015-11-16 NOTE — Patient Instructions (Signed)
Is nice to see you today! Your cough is likely due to common cold. This is a viral infection. This usually resolves on its own. I recommend adequate hydration with water or Gatorade. I have also sent a prescription for Tessalon to help with cough. A tablespoonful of honey can't help with cough as well. Cough may take up to 4-5 weeks to resolve. However, you can come back and see [place] if his symptom get worse or if you have shortness of breath, chest pain, fever or other symptoms concerning to you.    Upper Respiratory Infection, Adult Most upper respiratory infections (URIs) are caused by a virus. A URI affects the nose, throat, and upper air passages. The most common type of URI is often called "the common cold." HOME CARE   Take medicines only as told by your doctor.  Gargle warm saltwater or take cough drops to comfort your throat as told by your doctor.  Use a warm mist humidifier or inhale steam from a shower to increase air moisture. This may make it easier to breathe.  Drink enough fluid to keep your pee (urine) clear or pale yellow.  Eat soups and other clear broths.  Have a healthy diet.  Rest as needed.  Go back to work when your fever is gone or your doctor says it is okay.  You may need to stay home longer to avoid giving your URI to others.  You can also wear a face mask and wash your hands often to prevent spread of the virus.  Use your inhaler more if you have asthma.  Do not use any tobacco products, including cigarettes, chewing tobacco, or electronic cigarettes. If you need help quitting, ask your doctor. GET HELP IF:  You are getting worse, not better.  Your symptoms are not helped by medicine.  You have chills.  You are getting more short of breath.  You have brown or red mucus.  You have yellow or brown discharge from your nose.  You have pain in your face, especially when you bend forward.  You have a fever.  You have puffy (swollen) neck  glands.  You have pain while swallowing.  You have white areas in the back of your throat. GET HELP RIGHT AWAY IF:   You have very bad or constant:  Headache.  Ear pain.  Pain in your forehead, behind your eyes, and over your cheekbones (sinus pain).  Chest pain.  You have long-lasting (chronic) lung disease and any of the following:  Wheezing.  Long-lasting cough.  Coughing up blood.  A change in your usual mucus.  You have a stiff neck.  You have changes in your:  Vision.  Hearing.  Thinking.  Mood. MAKE SURE YOU:   Understand these instructions.  Will watch your condition.  Will get help right away if you are not doing well or get worse.   This information is not intended to replace advice given to you by your health care provider. Make sure you discuss any questions you have with your health care provider.   Document Released: 07/26/2007 Document Revised: 06/23/2014 Document Reviewed: 05/14/2013 Elsevier Interactive Patient Education [employer].

## 2015-11-16 NOTE — Assessment & Plan Note (Signed)
With runny nose and congestion likely due to viral URI. Lung exam within normal limits. Recommended good hydration and a tablespoonful of honey for cough. Also gave prescription for Tessalon pearls. Discussed return precautions.   Patient already had his flu shots this year.

## 2015-11-16 NOTE — Progress Notes (Signed)
   Subjective:    Patient ID: Terry Martinez is a 75 y.o. old male.  HPI #Cough: this has been going on four days after he went to a baseball game. Cough is productive with white to yellow phlegem. RUQ Pain with cough. Reports runny nose and congestion. Denies fever, chills, shortness of breath, chest pain, orthopnea or swelling in his legs . Eating and drinking as usual. Reports nausea but denies emesis. Reports diarrhea that he describes as loose once 2 days ago. Denies blood in it.   PMH: reviewed  SH: quit in 2000. About 70-pack-year  Review of Systems Per HPI Objective:   Vitals:   11/16/15 0915  BP: (!) 142/60  Pulse: 67  Temp: 97.6 F (36.4 C)  TempSrc: Oral  Weight: 288 lb 9.6 oz (130.9 kg)  Height: 6\' 3"  (1.905 m)    GEN: appears welll, no apparent distress. HEENT:   Eyes: without conjunctival injection, sclera anicteric,   Ears: normal TM and ear canal,   Nares: Negative for rhinorrhea, congestion or erythema,   Oropharynx: mmm without erythema or exudation CVS: Irregularly irregular, normal s1 and s2, no murmurs, no edema RESP: no increased work of breathing, good air movement bilaterally, no crackles or wheeze GI: Bowel sounds present and normal, soft, non-tender,non-distended, negative Murphy sign HEM: Negative for cervical lymphadenopathy NEURO: alert and oriented appropriately, no gross defecits  PSYCH: appropriate mood and affect   Assessment & Plan:  Cough With runny nose and congestion likely due to viral URI. Lung exam within normal limits. Recommended good hydration and a tablespoonful of honey for cough. Also gave prescription for Tessalon pearls. Discussed return precautions.   Patient already had his flu shots this year.

## 2015-11-16 NOTE — Telephone Encounter (Signed)
Prescription is ready for pick up. Called patient. He says pharmacy called him and told him his prescription is ready for pick up.

## 2015-11-16 NOTE — Telephone Encounter (Signed)
Pt has called several times since appointment this morning, [facility] keeps telling him there are no Rx for him. Please give pt a call once the Rx has been called in. Thanks! ep

## 2015-11-16 NOTE — Telephone Encounter (Signed)
Pt wants a referral back to DR. Novella Olive [phone] Dermatology is [place].  He has a place on his face that he wants him to look at. [facility]

## 2015-11-17 NOTE — Telephone Encounter (Signed)
Referral placed  [name], MD, MPH PGY-3,  [facility] 11/17/2015 11:57 AM

## 2015-11-19 ENCOUNTER — Encounter: Payer: Self-pay | Admitting: Internal Medicine

## 2015-11-25 ENCOUNTER — Ambulatory Visit (INDEPENDENT_AMBULATORY_CARE_PROVIDER_SITE_OTHER): Payer: Medicare Other | Admitting: Orthopedic Surgery

## 2015-11-25 DIAGNOSIS — Z89411 Acquired absence of right great toe: Secondary | ICD-10-CM

## 2015-11-25 DIAGNOSIS — L97411 Non-pressure chronic ulcer of right heel and midfoot limited to breakdown of skin: Secondary | ICD-10-CM

## 2015-12-06 ENCOUNTER — Encounter: Payer: Self-pay | Admitting: Internal Medicine

## 2015-12-06 ENCOUNTER — Ambulatory Visit (INDEPENDENT_AMBULATORY_CARE_PROVIDER_SITE_OTHER): Payer: Medicare Other | Admitting: *Deleted

## 2015-12-06 ENCOUNTER — Other Ambulatory Visit (HOSPITAL_COMMUNITY): Payer: Self-pay | Admitting: Family

## 2015-12-06 ENCOUNTER — Ambulatory Visit (INDEPENDENT_AMBULATORY_CARE_PROVIDER_SITE_OTHER): Payer: Medicare Other | Admitting: Internal Medicine

## 2015-12-06 VITALS — BP 130/78 | HR 71 | Ht 75.0 in | Wt 265.8 lb

## 2015-12-06 DIAGNOSIS — Z7901 Long term (current) use of anticoagulants: Secondary | ICD-10-CM | POA: Diagnosis not present

## 2015-12-06 DIAGNOSIS — I70209 Unspecified atherosclerosis of native arteries of extremities, unspecified extremity: Secondary | ICD-10-CM | POA: Diagnosis not present

## 2015-12-06 DIAGNOSIS — E785 Hyperlipidemia, unspecified: Secondary | ICD-10-CM | POA: Diagnosis not present

## 2015-12-06 DIAGNOSIS — I340 Nonrheumatic mitral (valve) insufficiency: Secondary | ICD-10-CM

## 2015-12-06 DIAGNOSIS — I25119 Atherosclerotic heart disease of native coronary artery with unspecified angina pectoris: Secondary | ICD-10-CM | POA: Diagnosis not present

## 2015-12-06 DIAGNOSIS — I209 Angina pectoris, unspecified: Secondary | ICD-10-CM | POA: Diagnosis not present

## 2015-12-06 DIAGNOSIS — I4891 Unspecified atrial fibrillation: Secondary | ICD-10-CM | POA: Diagnosis not present

## 2015-12-06 DIAGNOSIS — I351 Nonrheumatic aortic (valve) insufficiency: Secondary | ICD-10-CM

## 2015-12-06 LAB — BASIC METABOLIC PANEL
BUN: 28 mg/dL — ABNORMAL HIGH (ref 7–25)
CHLORIDE: 104 mmol/L (ref 98–110)
CO2: 25 mmol/L (ref 20–31)
Calcium: 9.3 mg/dL (ref 8.6–10.3)
Creat: 1.43 mg/dL — ABNORMAL HIGH (ref 0.70–1.18)
GLUCOSE: 83 mg/dL (ref 65–99)
POTASSIUM: 5 mmol/L (ref 3.5–5.3)
SODIUM: 139 mmol/L (ref 135–146)

## 2015-12-06 LAB — LIPID PANEL
CHOL/HDL RATIO: 1.8 ratio (ref <=5.0)
CHOLESTEROL: 91 mg/dL — AB (ref 125–200)
HDL: 50 mg/dL (ref >=40)
LDL CALC: 26 mg/dL (ref <130)
TRIGLYCERIDES: 77 mg/dL (ref <150)
VLDL: 15 mg/dL (ref <30)

## 2015-12-06 LAB — CBC
HEMATOCRIT: 40.2 % (ref 38.5–50.0)
HEMOGLOBIN: 13.7 g/dL (ref 13.2–17.1)
MCH: 31.6 pg (ref 27.0–33.0)
MCHC: 34.1 g/dL (ref 32.0–36.0)
MCV: 92.6 fL (ref 80.0–100.0)
MPV: 9.4 fL (ref 7.5–12.5)
Platelets: 152 10*3/uL (ref 140–400)
RBC: 4.34 MIL/uL (ref 4.20–5.80)
RDW: 14.9 % (ref 11.0–15.0)
WBC: 8.9 10*3/uL (ref 3.8–10.8)

## 2015-12-06 LAB — POCT INR: INR: 2.6

## 2015-12-06 NOTE — Patient Instructions (Signed)
Your physician recommends that you continue on your current medications as directed. Please refer to the Current Medication list given to you today. Your physician recommends that you return for lab work today (CBC, BMET, [place])  Your physician has requested that you have an echocardiogram. Echocardiography is a painless test that uses sound waves to create images of your heart. It provides your doctor with information about the size and shape of your heart and how well your heart's chambers and valves are working. This procedure takes approximately one hour. There are no restrictions for this procedure.  Your physician wants you to follow-up in: MAY, 2018 [place].  You will receive a reminder letter in the mail two months in advance. If you don't receive a letter, please call our office to schedule the follow-up appointment.

## 2015-12-06 NOTE — Progress Notes (Signed)
Cardiology Office Note   Date:  12/06/2015   ID:  Terry Martinez, DOB [birth year], MRN [id number]  PCP:  Lavon Paganini, MD  Cardiologist:   Dorris Carnes, MD   F/U of CAD      History of Present Illness: Terry Martinez is a 75 y.o. male with a history of CAD, PVOD and atrial fib  I saw him in April 2017  Since seen he denies CP  Breathing is OK  Denies dizziness NO palpitations  About to have foot surgey    Admits to adding salt to food.       Outpatient Medications Prior to Visit  Medication Sig Dispense Refill  . albuterol (PROVENTIL HFA;VENTOLIN HFA) 108 (90 BASE) MCG/ACT inhaler Inhale 2 puffs into the lungs every 6 (six) hours as needed for wheezing or shortness of breath. 1 Inhaler 2  . allopurinol (ZYLOPRIM) 100 MG tablet TAKE ONE TABLET BY MOUTH ONCE DAILY IN THE EVENING 90 tablet 2  . allopurinol (ZYLOPRIM) 300 MG tablet TAKE ONE TABLET BY MOUTH ONCE DAILY IN THE MORNING 90 tablet 3  . Ascorbic Acid (VITAMIN C PO) Take 1 tablet by mouth daily.    . Cholecalciferol (VITAMIN D-3 PO) Take 1 tablet by mouth daily.    . enalapril (VASOTEC) 10 MG tablet Take 1 tablet (10 mg total) by mouth daily. 90 tablet 1  . EPINEPHrine (EPI-PEN) 0.3 mg/0.3 mL SOAJ injection Inject 0.3 mLs (0.3 mg total) into the muscle once. (Patient taking differently: Inject 0.3 mg into the muscle once as needed (For anaphylaxis.). ) 1 Device 1  . furosemide (LASIX) 40 MG tablet TAKE ONE TABLET BY MOUTH ONCE DAILY 30 tablet 4  . lovastatin (MEVACOR) 20 MG tablet TAKE ONE TABLET BY MOUTH AT BEDTIME 90 tablet 2  . metoprolol tartrate (LOPRESSOR) 25 MG tablet Take 0.5 tablets (12.5 mg total) by mouth 2 (two) times daily. 60 tablet 6  . metroNIDAZOLE (METROGEL) 1 % gel Apply topically daily. For rosacea (Patient taking differently: Apply 1 application topically daily. For rosacea) 45 g 2  . Multiple Vitamins-Minerals (MENS MULTIVITAMIN PLUS PO) Take 1 tablet by mouth daily.    . nitroGLYCERIN (NITROSTAT) 0.4  MG SL tablet Place 1 tablet (0.4 mg total) under the tongue every 5 (five) minutes as needed for chest pain. 90 tablet 3  . polyethylene glycol powder (GLYCOLAX/MIRALAX) powder DISSOLVE 17G OF POWDER IN 8 OUNCES OF LIQUID AND DRINK 2 TIMES PER DAY AS NEEDED FOR MILD CONSTIPATION. (Patient taking differently: Take 17 g by mouth daily as needed for mild constipation. ) 527 g 11  . potassium chloride SA (KLOR-CON M20) 20 MEQ tablet Take 1 tablet (20 mEq total) by mouth daily. 90 tablet 2  . triamcinolone ointment (KENALOG) 0.5 % Apply 1 application topically 2 (two) times daily as needed. (Patient taking differently: Apply 1 application topically 2 (two) times daily as needed (Apply to affected area.). ) 30 g 2  . warfarin (COUMADIN) 5 MG tablet Take 1 tablet (5 mg total) by mouth daily. Or as directed (Patient taking differently: Take 2.5-5 mg by mouth every evening. Take half a tablet (2.5mg ) on Tuesday and Friday and one tablet (5mg ) the rest of the week.) 90 tablet 3  . benzonatate (TESSALON) 100 MG capsule Take 1 capsule (100 mg total) by mouth 2 (two) times daily as needed for cough. 20 capsule 0  . methocarbamol (ROBAXIN) 750 MG tablet Take 1 tablet (750 mg total) by mouth 4 (four) times daily  as needed (use for muscle cramps/pain). 30 tablet 2  . oxyCODONE (OXY IR/ROXICODONE) 5 MG immediate release tablet Take 1-2 tablets (5-10 mg total) by mouth every 4 (four) hours as needed for moderate pain, severe pain or breakthrough pain. 40 tablet 0  . tamsulosin (FLOMAX) 0.4 MG CAPS capsule Take 0.4 mg by mouth daily.      No facility-administered medications prior to visit.      Allergies:   Zocor [simvastatin]   Past Medical History:  Diagnosis Date  . Anemia    hx low iron  . Atrial fibrillation ([place])   . Cancer (HCC)    squamous cell carcinoma - left arm  . Chronic pain   . Constipation   . COPD (chronic obstructive pulmonary disease) ([place])   . DVT, lower extremity ([place])    many years  .  Family history of adverse reaction to anesthesia    sister has difficulty waking up  . Headache    occasional  . History of kidney stones   . Hyperlipidemia   . Hypertension   . Incisional hernia    x 2  . Myocardial infarction    3 stents  . Peripheral artery disease ([place])   . Pneumonia   . Umbilical hernia     Past Surgical History:  Procedure Laterality Date  . ABDOMINAL AORTAGRAM  05/04/2014   Procedure: ABDOMINAL Maxcine Ham;  Surgeon: Angelia Mould, MD;  Location: [facility] CATH LAB;  Service: Cardiovascular;;  . AMPUTATION Right 02/19/2014   Procedure: AMPUTATION RAY-RIGHT GREAT TOE;  Surgeon: Angelia Mould, MD;  Location: [place];  Service: Vascular;  Laterality: Right;  . AMPUTATION Right 05/08/2014   Procedure: 1st Ray and 5th Ray Amputation Right Foot;  Surgeon: Newt Minion, MD;  Location: [place];  Service: Orthopedics;  Laterality: Right;  . ANKLE SURGERY Left   . BACK SURGERY     2 titanium rods   . COLONOSCOPY    . EYE SURGERY Bilateral    cataract surgery with lens implant  . FEMORAL-POPLITEAL BYPASS GRAFT    . HERNIA REPAIR     umbicial  . LAPAROSCOPIC ASSISTED VENTRAL HERNIA REPAIR N/A 08/11/2015   Procedure: LAPAROSCOPIC ASSISTED VENTRAL WALL HERNIA REPAIR with mesh;  Surgeon: Michael Boston, MD;  Location: [facility];  Service: General;  Laterality: N/A;  . LAPAROSCOPIC LYSIS OF ADHESIONS N/A 08/11/2015   Procedure: LAPAROSCOPIC LYSIS OF ADHESIONS;  Surgeon: Michael Boston, MD;  Location: [facility];  Service: General;  Laterality: N/A;  . LOWER EXTREMITY ANGIOGRAM N/A 05/04/2014   Procedure: LOWER EXTREMITY ANGIOGRAM;  Surgeon: Angelia Mould, MD;  Location[facility] CATH LAB;  Service: Cardiovascular;  Laterality: N/A;  . PERCUTANEOUS CORONARY STENT INTERVENTION (PCI-S)    . TONSILLECTOMY       Social History:  The patient  reports that he quit smoking about 17 years ago. His smoking use included Cigarettes. His smokeless tobacco use includes Snuff. He reports  that he does not drink alcohol or use drugs.   Family History:  The patient's family history includes Alcohol abuse in his sister; Cancer in his brother, father, and mother; Depression in his brother; Diabetes in his mother; Early death in his brother; Heart disease in his brother, maternal grandfather, maternal grandmother, mother, and sister; Hyperlipidemia in his brother, mother, and sister; Hypertension in his brother, mother, and sister; Kidney disease in his maternal grandfather and maternal grandmother; Lymphoma in his mother; Stroke in his sister.    ROS:  Please  see the history of present illness. All other systems are reviewed and  Negative to the above problem except as noted.    PHYSICAL EXAM: VS:  BP 130/78   Pulse 71   Ht 6\' 3"  (1.905 m)   Wt 265 lb 12 oz (120.5 kg)   SpO2 96%   BMI 33.22 kg/m   GEN: Obese 75 yo , in no acute distress  HEENT: normal  Neck: no JVD, carotid bruits, or masses Cardiac: RRR; Gr II/VI systolic murmur LSB and apex  , rubs, or gallops,1+ edema  Respiratory:  clear to auscultation bilaterally, normal work of breathing GI: soft, nontender, nondistended, + BS  No hepatomegaly  MS: no deformity Moving all extremities   Skin: warm and dry, no rash Neuro:  Strength and sensation are intact Psych: euthymic mood, full affect   EKG:  EKG is ordered today.   Lipid Panel    Component Value Date/Time   CHOL 72 11/24/2013 0900   TRIG 65 11/24/2013 0900   HDL 31 (L) 11/24/2013 0900   CHOLHDL 2.3 11/24/2013 0900   VLDL 13 11/24/2013 0900   LDLCALC 28 11/24/2013 0900      Wt Readings from Last 3 Encounters:  12/06/15 265 lb 12 oz (120.5 kg)  11/16/15 288 lb 9.6 oz (130.9 kg)  08/11/15 281 lb (127.5 kg)      ASSESSMENT AND PLAN:  1  CAD No symptoms to sugg angina  Keep on current regimen   Would recomm he cut back on salt  Will check CBC and BMET today    2  Atrial fib  Cont current meds    3  HTN  BP is OK  4.  HL  Check lipiids   5   Mitral regurg / Aortic regurg  Echo to reeval  Also look at asc aorta  Last echo was 2 years ago      6  PVOD  Continue meds  (chol)   Current medicines are reviewed at length with the patient today.  The patient does not have concerns regarding medicines.  Signed, Dorris Carnes, MD  12/06/2015 9:59 AM    [facility] [address], [place], [place]  [address] Phone: [phone]; Fax: [phone]

## 2015-12-07 ENCOUNTER — Ambulatory Visit: Payer: Medicare Other | Admitting: Family

## 2015-12-14 ENCOUNTER — Encounter (HOSPITAL_COMMUNITY): Payer: Self-pay | Admitting: *Deleted

## 2015-12-14 MED ORDER — DEXTROSE 5 % IV SOLN
3.0000 g | INTRAVENOUS | Status: AC
  Administered 2015-12-15: 3 g via INTRAVENOUS
  Filled 2015-12-14: qty 3000

## 2015-12-14 NOTE — Progress Notes (Signed)
Anesthesia Chart Review: SAME DAY WORK-UP.  Patient is a 75 year old male scheduled for right foot partial excision medial cuneiform on 12/15/15 by Dr. Sharol Given.  History includes former smoker, CAD/MI (RCA stent '05), PAD s/p left axillobifemoral BPG (Dr. 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 He reports his sister had difficultly waking up after anesthesia. His OSA screening score is 6.   - PCP is Dr. Lavon Paganini with [facility]. - Cardiologist is Dr. Dorris Carnes, last visit 12/06/15. She was aware that he had upcoming foot surgery. He denied CP, dizziness, palpitation, and felt breathing was OK. She felt he had no issues to suggest angina. She was planning routine echo for two year follow-up to re-evaluate MR/AR and check ascending aorta (two year mark will be 01/2016). He has been seeing Dr. Harrington Challenger since he moved back to [facility] in late 2014.  - Vascular surgeon is Dr. Deitra Mayo.  Meds include albuterol, allopurinol, enalapril, Lasix, lovastatin, Lopressor, nitroglycerin, KCl, Flomax, warfarin. He is followed at [facility] anticoagulation clinic. Defer preoperative warfarin instructions to surgeon.   06/07/15 EKG ([facility]): Afib at 51 bpm, non-specific ST abnormality, probably digitalis effect, abnormal QRS-T angle, consider primary T wave abnormality.  02/06/14 Echo: Study Conclusions - Left ventricle: The cavity size was mildly dilated. Systolic function was mildly reduced. The estimated ejection fraction was in the range of 45% to 50%. Although no diagnostic regional wall motion abnormality was identified, this possibility cannot be completely excluded on the basis of this study. - Aortic valve: There was mild  to moderate regurgitation directed eccentrically in the LVOT. - Aorta: Ascending aorta: The ascending aorta was mildly dilated. 42 mm. Aortic root 50 mm. [03/31/14 CTA Chest: Adequate contrast opacification of the thoracic aorta with no evidence of dissection, aneurysm, or stenosis.] - Mitral valve: Calcified annulus. There was mild to moderate regurgitation directed centrally. - Left atrium: The atrium was moderately dilated. - Right ventricle: The cavity size was moderately dilated. Systolicfunction was moderately reduced. - Right atrium: The atrium was severely dilated. - Atrial septum: No defect or patent foramen ovale was identified. - Pulmonary arteries: Systolic pressure was mildly increased. PA peak pressure: 47 mm Hg (S).  05/07/07 Nuclear stress test [facility]; scanned under Results Review tab): Impression: 1. A mild perfusion defect is seen in the mid inferior and apical inferior regions. This is consistent with an infarct/scar. There is no scintigraphic evidence of inducible myocardial ischemia. 2. The post stress EF is 56%. 3. Exercise tolerance not tested since this is a pharmacologic study. 4. Diaphragmatic attenuation is suggested.  Labs from 12/06/15 noted. INR 2.6. BUN 28, Cr 1.42, appears stable when compared to labs from 07/2015. Glucose 83. CBC WNL.  He tolerated ventral hernia repair 4 months ago. He had recent cardiology follow-up. Last echo 01/2014 with plans to repeat by ~ 01/2016 (for two year follow-up). Renal function appears stable. Last INR is 2.6--for repeat day of surgery.  Patient is a same day work-up, so further evaluation by his anesthesia team on the day of surgery to ensure no acute CV/CHF issues prior to proceeding and to discuss anesthesia plan.  George Hugh [facility]/Anesthesiology Phone [phone] 12/14/2015 11:11 AM

## 2015-12-14 NOTE — Progress Notes (Signed)
   12/14/15 1018  OBSTRUCTIVE SLEEP APNEA  Have you ever been diagnosed with sleep apnea through a sleep study? No  Do you snore loudly (loud enough to be heard through closed doors)?  1  Do you often feel tired, fatigued, or sleepy during the daytime (such as falling asleep during driving or talking to someone)? 1  Has anyone observed you stop breathing during your sleep? 1  Do you have, or are you being treated for high blood pressure? 1  BMI more than 35 kg/m2? 0  Age > 39 (1-yes) 1  Male Gender (Yes=1) 1  Obstructive Sleep Apnea Score 6  Score 5 or greater  Results sent to PCP

## 2015-12-15 ENCOUNTER — Ambulatory Visit (HOSPITAL_COMMUNITY)
Admission: RE | Admit: 2015-12-15 | Discharge: 2015-12-15 | Disposition: A | Discharge status: Home / Self Care | Payer: Medicare Other | Source: Ambulatory Visit | Attending: Orthopedic Surgery | Admitting: Orthopedic Surgery

## 2015-12-15 ENCOUNTER — Encounter (HOSPITAL_COMMUNITY)
Admission: RE | Disposition: A | Discharge status: Home / Self Care | Payer: Self-pay | Source: Ambulatory Visit | Attending: Orthopedic Surgery

## 2015-12-15 ENCOUNTER — Ambulatory Visit (HOSPITAL_COMMUNITY): Payer: Medicare Other | Admitting: Vascular Surgery

## 2015-12-15 ENCOUNTER — Encounter (HOSPITAL_COMMUNITY): Payer: Self-pay | Admitting: Certified Registered Nurse Anesthetist

## 2015-12-15 ENCOUNTER — Telehealth (INDEPENDENT_AMBULATORY_CARE_PROVIDER_SITE_OTHER): Payer: Self-pay | Admitting: *Deleted

## 2015-12-15 DIAGNOSIS — M109 Gout, unspecified: Secondary | ICD-10-CM | POA: Diagnosis not present

## 2015-12-15 DIAGNOSIS — K5900 Constipation, unspecified: Secondary | ICD-10-CM | POA: Insufficient documentation

## 2015-12-15 DIAGNOSIS — L97519 Non-pressure chronic ulcer of other part of right foot with unspecified severity: Secondary | ICD-10-CM | POA: Diagnosis not present

## 2015-12-15 DIAGNOSIS — E11610 Type 2 diabetes mellitus with diabetic neuropathic arthropathy: Secondary | ICD-10-CM | POA: Diagnosis not present

## 2015-12-15 DIAGNOSIS — I252 Old myocardial infarction: Secondary | ICD-10-CM | POA: Insufficient documentation

## 2015-12-15 DIAGNOSIS — E11621 Type 2 diabetes mellitus with foot ulcer: Secondary | ICD-10-CM | POA: Insufficient documentation

## 2015-12-15 DIAGNOSIS — Z95820 Peripheral vascular angioplasty status with implants and grafts: Secondary | ICD-10-CM | POA: Insufficient documentation

## 2015-12-15 DIAGNOSIS — E1151 Type 2 diabetes mellitus with diabetic peripheral angiopathy without gangrene: Secondary | ICD-10-CM | POA: Insufficient documentation

## 2015-12-15 DIAGNOSIS — Z7901 Long term (current) use of anticoagulants: Secondary | ICD-10-CM | POA: Insufficient documentation

## 2015-12-15 DIAGNOSIS — J449 Chronic obstructive pulmonary disease, unspecified: Secondary | ICD-10-CM | POA: Insufficient documentation

## 2015-12-15 DIAGNOSIS — F17290 Nicotine dependence, other tobacco product, uncomplicated: Secondary | ICD-10-CM | POA: Diagnosis not present

## 2015-12-15 DIAGNOSIS — Z955 Presence of coronary angioplasty implant and graft: Secondary | ICD-10-CM | POA: Diagnosis not present

## 2015-12-15 DIAGNOSIS — Z86718 Personal history of other venous thrombosis and embolism: Secondary | ICD-10-CM | POA: Diagnosis not present

## 2015-12-15 DIAGNOSIS — Z79899 Other long term (current) drug therapy: Secondary | ICD-10-CM | POA: Insufficient documentation

## 2015-12-15 DIAGNOSIS — Z89421 Acquired absence of other right toe(s): Secondary | ICD-10-CM | POA: Diagnosis not present

## 2015-12-15 DIAGNOSIS — E1140 Type 2 diabetes mellitus with diabetic neuropathy, unspecified: Secondary | ICD-10-CM | POA: Diagnosis not present

## 2015-12-15 DIAGNOSIS — G8918 Other acute postprocedural pain: Secondary | ICD-10-CM | POA: Diagnosis not present

## 2015-12-15 DIAGNOSIS — I4891 Unspecified atrial fibrillation: Secondary | ICD-10-CM | POA: Insufficient documentation

## 2015-12-15 DIAGNOSIS — E785 Hyperlipidemia, unspecified: Secondary | ICD-10-CM | POA: Insufficient documentation

## 2015-12-15 DIAGNOSIS — Z89411 Acquired absence of right great toe: Secondary | ICD-10-CM | POA: Insufficient documentation

## 2015-12-15 DIAGNOSIS — I10 Essential (primary) hypertension: Secondary | ICD-10-CM | POA: Insufficient documentation

## 2015-12-15 DIAGNOSIS — I2510 Atherosclerotic heart disease of native coronary artery without angina pectoris: Secondary | ICD-10-CM | POA: Insufficient documentation

## 2015-12-15 DIAGNOSIS — D649 Anemia, unspecified: Secondary | ICD-10-CM | POA: Diagnosis not present

## 2015-12-15 HISTORY — PX: I & D EXTREMITY: SHX5045

## 2015-12-15 HISTORY — DX: Gout, unspecified: M10.9

## 2015-12-15 HISTORY — DX: Unspecified osteoarthritis, unspecified site: M19.90

## 2015-12-15 HISTORY — DX: Dyspnea, unspecified: R06.00

## 2015-12-15 HISTORY — DX: Atherosclerotic heart disease of native coronary artery without angina pectoris: I25.10

## 2015-12-15 LAB — PROTIME-INR
INR: 2.89
PROTHROMBIN TIME: 30.8 s — AB (ref 11.4–15.2)

## 2015-12-15 SURGERY — IRRIGATION AND DEBRIDEMENT EXTREMITY
Anesthesia: Monitor Anesthesia Care | Site: Foot | Laterality: Right

## 2015-12-15 MED ORDER — LACTATED RINGERS IV SOLN
INTRAVENOUS | Status: DC | PRN
  Administered 2015-12-15: 08:00:00 via INTRAVENOUS

## 2015-12-15 MED ORDER — ONDANSETRON HCL 4 MG/2ML IJ SOLN: 4.0000 mg | Freq: Four times a day (QID) | INTRAMUSCULAR | Status: DC | PRN

## 2015-12-15 MED ORDER — BUPIVACAINE-EPINEPHRINE (PF) 0.5% -1:200000 IJ SOLN
INTRAMUSCULAR | Status: DC | PRN
  Administered 2015-12-15 (×2): 20 mL via PERINEURAL

## 2015-12-15 MED ORDER — ONDANSETRON HCL 4 MG/2ML IJ SOLN
INTRAMUSCULAR | Status: DC | PRN
  Administered 2015-12-15: 4 mg via INTRAVENOUS

## 2015-12-15 MED ORDER — 0.9 % SODIUM CHLORIDE (POUR BTL) OPTIME
TOPICAL | Status: DC | PRN
  Administered 2015-12-15: 1000 mL

## 2015-12-15 MED ORDER — FENTANYL CITRATE (PF) 100 MCG/2ML IJ SOLN
INTRAMUSCULAR | Status: AC
  Filled 2015-12-15: qty 4

## 2015-12-15 MED ORDER — PROPOFOL 10 MG/ML IV BOLUS
INTRAVENOUS | Status: AC
  Filled 2015-12-15: qty 20

## 2015-12-15 MED ORDER — HYDROCODONE-ACETAMINOPHEN 5-325 MG PO TABS
1.0000 | ORAL_TABLET | Freq: Four times a day (QID) | ORAL | 0 refills | Status: DC | PRN
Start: 2015-12-15 — End: 2016-04-14

## 2015-12-15 MED ORDER — PROPOFOL 500 MG/50ML IV EMUL
INTRAVENOUS | Status: DC | PRN
  Administered 2015-12-15: 50 ug/kg/min via INTRAVENOUS

## 2015-12-15 MED ORDER — ROCURONIUM BROMIDE 10 MG/ML (PF) SYRINGE
PREFILLED_SYRINGE | INTRAVENOUS | Status: AC
  Filled 2015-12-15: qty 10

## 2015-12-15 MED ORDER — FENTANYL CITRATE (PF) 100 MCG/2ML IJ SOLN: 25.0000 ug | INTRAMUSCULAR | Status: DC | PRN

## 2015-12-15 MED ORDER — PROPOFOL 10 MG/ML IV BOLUS
INTRAVENOUS | Status: AC
  Filled 2015-12-15: qty 40

## 2015-12-15 MED ORDER — ONDANSETRON HCL 4 MG/2ML IJ SOLN
INTRAMUSCULAR | Status: AC
  Filled 2015-12-15: qty 4

## 2015-12-15 MED ORDER — OXYCODONE HCL 5 MG PO TABS: 5.0000 mg | ORAL_TABLET | Freq: Once | ORAL | Status: DC | PRN

## 2015-12-15 MED ORDER — FENTANYL CITRATE (PF) 100 MCG/2ML IJ SOLN
INTRAMUSCULAR | Status: DC | PRN
  Administered 2015-12-15 (×3): 50 ug via INTRAVENOUS

## 2015-12-15 MED ORDER — OXYCODONE HCL 5 MG/5ML PO SOLN: 5.0000 mg | Freq: Once | ORAL | Status: DC | PRN

## 2015-12-15 MED ORDER — CHLORHEXIDINE GLUCONATE 4 % EX LIQD: 60.0000 mL | Freq: Once | CUTANEOUS | Status: DC

## 2015-12-15 MED ORDER — LIDOCAINE 2% (20 MG/ML) 5 ML SYRINGE
INTRAMUSCULAR | Status: AC
  Filled 2015-12-15: qty 5

## 2015-12-15 SURGICAL SUPPLY — 39 items
BLADE SURG 10 STRL SS (BLADE) IMPLANT
BLADE SURG 21 STRL SS (BLADE) IMPLANT
BNDG COHESIVE 4X5 TAN STRL (GAUZE/BANDAGES/DRESSINGS) ×3 IMPLANT
BNDG COHESIVE 6X5 TAN STRL LF (GAUZE/BANDAGES/DRESSINGS) IMPLANT
BNDG GAUZE ELAST 4 BULKY (GAUZE/BANDAGES/DRESSINGS) ×3 IMPLANT
COVER SURGICAL LIGHT HANDLE (MISCELLANEOUS) ×3 IMPLANT
DRAPE U-SHAPE 47X51 STRL (DRAPES) ×3 IMPLANT
DRSG ADAPTIC 3X8 NADH LF (GAUZE/BANDAGES/DRESSINGS) ×3 IMPLANT
DURAPREP 26ML APPLICATOR (WOUND CARE) ×3 IMPLANT
ELECT CAUTERY BLADE 6.4 (BLADE) IMPLANT
ELECT REM PT RETURN 9FT ADLT (ELECTROSURGICAL) ×3
ELECTRODE REM PT RTRN 9FT ADLT (ELECTROSURGICAL) ×1 IMPLANT
GAUZE SPONGE 4X4 12PLY STRL (GAUZE/BANDAGES/DRESSINGS) ×3 IMPLANT
GAUZE SPONGE 4X4 16PLY XRAY LF (GAUZE/BANDAGES/DRESSINGS) ×3 IMPLANT
GLOVE BIOGEL PI IND STRL 9 (GLOVE) ×1 IMPLANT
GLOVE BIOGEL PI INDICATOR 9 (GLOVE) ×2
GLOVE SURG ORTHO 9.0 STRL STRW (GLOVE) ×3 IMPLANT
GOWN STRL REUS W/ TWL LRG LVL3 (GOWN DISPOSABLE) ×1 IMPLANT
GOWN STRL REUS W/ TWL XL LVL3 (GOWN DISPOSABLE) ×2 IMPLANT
GOWN STRL REUS W/TWL LRG LVL3 (GOWN DISPOSABLE) ×2
GOWN STRL REUS W/TWL XL LVL3 (GOWN DISPOSABLE) ×4
HANDPIECE INTERPULSE COAX TIP (DISPOSABLE)
KIT BASIN OR (CUSTOM PROCEDURE TRAY) ×3 IMPLANT
KIT ROOM TURNOVER OR (KITS) ×3 IMPLANT
MANIFOLD NEPTUNE II (INSTRUMENTS) ×3 IMPLANT
NS IRRIG 1000ML POUR BTL (IV SOLUTION) ×3 IMPLANT
PACK ORTHO EXTREMITY (CUSTOM PROCEDURE TRAY) ×3 IMPLANT
PAD ARMBOARD 7.5X6 YLW CONV (MISCELLANEOUS) ×6 IMPLANT
SET HNDPC FAN SPRY TIP SCT (DISPOSABLE) IMPLANT
STOCKINETTE IMPERVIOUS 9X36 MD (GAUZE/BANDAGES/DRESSINGS) ×3 IMPLANT
SWAB COLLECTION DEVICE MRSA (MISCELLANEOUS) IMPLANT
TOWEL OR 17X24 6PK STRL BLUE (TOWEL DISPOSABLE) ×3 IMPLANT
TOWEL OR 17X26 10 PK STRL BLUE (TOWEL DISPOSABLE) ×3 IMPLANT
TUBE ANAEROBIC SPECIMEN COL (MISCELLANEOUS) IMPLANT
TUBE CONNECTING 12'X1/4 (SUCTIONS) ×1
TUBE CONNECTING 12X1/4 (SUCTIONS) ×2 IMPLANT
UNDERPAD 30X30 (UNDERPADS AND DIAPERS) ×3 IMPLANT
WATER STERILE IRR 1000ML POUR (IV SOLUTION) IMPLANT
YANKAUER SUCT BULB TIP NO VENT (SUCTIONS) ×3 IMPLANT

## 2015-12-15 NOTE — Anesthesia Procedure Notes (Signed)
Procedure Name: MAC Date/Time: 12/15/2015 9:05 AM Performed by: Garrison Columbus T Pre-anesthesia Checklist: Patient identified, Emergency Drugs available, Suction available and Patient being monitored Patient Re-evaluated:Patient Re-evaluated prior to inductionOxygen Delivery Method: Simple face mask Preoxygenation: Pre-oxygenation with 100% oxygen Intubation Type: IV induction Placement Confirmation: positive ETCO2 and breath sounds checked- equal and bilateral Dental Injury: Teeth and Oropharynx as per pre-operative assessment

## 2015-12-15 NOTE — Op Note (Signed)
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 male with a diagnosis of Chronic Ulcer Right Foot who failed conservative measures and elected for surgical management.    The risks benefits and alternatives were discussed with the patient preoperatively including but not limited to the risks of infection, bleeding, nerve injury, cardiopulmonary complications, the need for revision surgery, among others, and the patient was willing to proceed.  OPERATIVE IMPLANTS: None  OPERATIVE FINDINGS: No abscess or deep infection  OPERATIVE PROCEDURE: Patient was brought to the operating room after regional anesthesia. After adequate levels anesthesia obtained patient's right lower extremity was prepped using DuraPrep draped into a sterile field a timeout was called. The previous medial longitudinal incision was used this was carried down to bone. The prominence of the Charcot collapse of the medial cuneiform the plantar aspect of the bone was excised until the plantar aspect of the foot was level. This was irrigated with normal saline electrocautery was used for hemostasis. Incision was closed using 2-0 nylon a sterile compressive dressing was applied patient was taken the PACU in stable condition plan for discharge to home.

## 2015-12-15 NOTE — Transfer of Care (Signed)
Immediate Anesthesia Transfer of Care Note  Patient: Terry Martinez  Procedure(s) Performed: Procedure(s): Right Foot Partial Excision Medial Cuneiform (Right)  Patient Location: PACU  Anesthesia Type:MAC and Regional  Level of Consciousness: awake, alert  and oriented  Airway & Oxygen Therapy: Patient Spontanous Breathing and Patient connected to nasal cannula oxygen  Post-op Assessment: Report given to RN, Post -op Vital signs reviewed and stable and Patient moving all extremities X 4  Post vital signs: Reviewed and stable  Last Vitals:  Vitals:   12/15/15 0647  BP: 129/60  Pulse: (!) 57  Resp: 18  Temp: 36.3 C    Last Pain:  Vitals:   12/15/15 0647  TempSrc: Oral      Patients Stated Pain Goal: 3 ([id number])  Complications: No apparent anesthesia complications

## 2015-12-15 NOTE — Anesthesia Preprocedure Evaluation (Addendum)
Anesthesia Evaluation  Patient identified by MRN, date of birth, ID band Patient awake    Reviewed: Allergy & Precautions, H&P , NPO status , Patient's Chart, lab work & pertinent test results  Airway Mallampati: II  TM Distance: >3 FB Neck ROM: full    Dental  (+) Edentulous Upper, Edentulous Lower, Dental Advisory Given   Pulmonary shortness of breath, COPD, former smoker,    breath sounds clear to auscultation       Cardiovascular hypertension, + CAD, + Past MI, + Cardiac Stents and + Peripheral Vascular Disease  + dysrhythmias Atrial Fibrillation + Valvular Problems/Murmurs AI and MR  Rhythm:regular Rate:Normal  Stress test (2009): no ischemia.  EF 59%.  Cardiologist is Dorris Carnes.   Neuro/Psych  Headaches,    GI/Hepatic   Endo/Other    Renal/GU      Musculoskeletal  (+) Arthritis ,   Abdominal   Peds  Hematology   Anesthesia Other Findings   Reproductive/Obstetrics                            Anesthesia Physical Anesthesia Plan  ASA: III  Anesthesia Plan: MAC and Regional   Post-op Pain Management:    Induction: Intravenous  Airway Management Planned: Natural Airway and Simple Face Mask  Additional Equipment:   Intra-op Plan:   Post-operative Plan:   Informed Consent: I have reviewed the patients History and Physical, chart, labs and discussed the procedure including the risks, benefits and alternatives for the proposed anesthesia with the patient or authorized representative who has indicated his/her understanding and acceptance.   Dental advisory given  Plan Discussed with: CRNA, Anesthesiologist and Surgeon  Anesthesia Plan Comments:        Anesthesia Quick Evaluation

## 2015-12-15 NOTE — Telephone Encounter (Signed)
Pt. Just had surgery and needs to schedule 1 week follow up appt.Please call [phone].

## 2015-12-15 NOTE — Anesthesia Procedure Notes (Signed)
Anesthesia Regional Block:  Popliteal block  Pre-Anesthetic Checklist: ,, timeout performed, Correct Patient, Correct Site, Correct Laterality, Correct Procedure, Correct Position, site marked, Risks and benefits discussed,  Surgical consent,  Pre-op evaluation,  At surgeon's request and post-op pain management  Laterality: Right  Prep: chloraprep       Needles:  Injection technique: Single-shot  Needle Type: Echogenic Stimulator Needle     Needle Length:cm 9 cm Needle Gauge: 21 G    Additional Needles:  Procedures: ultrasound guided (picture in chart) and nerve stimulator Popliteal block  Nerve Stimulator or Paresthesia:  Response: plantar flexion of foot, 0.45 mA,   Additional Responses:   Narrative:  Start time: 12/15/2015 8:05 AM End time: 12/15/2015 8:13 AM Injection made incrementally with aspirations every 5 mL.  Performed by: Personally  Anesthesiologist: Nateisha Moyd  Additional Notes: Functioning IV was confirmed and monitors were applied.  A 44mm 21ga Arrow echogenic stimulator needle was used. Sterile prep and drape,hand hygiene and sterile gloves were used.  Negative aspiration and negative test dose prior to incremental administration of local anesthetic (20 mL of 0.5% bupivacaine +epi). The patient tolerated the procedure well.  Ultrasound guidance: relevent anatomy identified, needle position confirmed, local anesthetic spread visualized around nerve(s), vascular puncture avoided.  Image printed for medical record.   Right adductor canal block also performed.  20 mL of 0.5% buipivacaine +epi was injected at this site.  Sterile technique.  Ultrasound guidance.  Pt tolerated the procedure well.

## 2015-12-15 NOTE — Anesthesia Preprocedure Evaluation (Signed)
Anesthesia Evaluation    Reviewed: Allergy & Precautions  Airway        Dental  (+) Dental Advisory Given   Pulmonary shortness of breath and with exertion, COPD,  COPD inhaler, former smoker,           Cardiovascular hypertension, Pt. on medications and Pt. on home beta blockers + CAD, + Past MI, + Cardiac Stents, + Peripheral Vascular Disease and + DVT  + dysrhythmias Atrial Fibrillation      Neuro/Psych  Headaches,    GI/Hepatic   Endo/Other    Renal/GU      Musculoskeletal  (+) Arthritis ,   Abdominal   Peds  Hematology   Anesthesia Other Findings   Reproductive/Obstetrics                             Anesthesia Physical Anesthesia Plan Anesthesia Quick Evaluation

## 2015-12-15 NOTE — H&P (Signed)
Terry Martinez is an 75 y.o. male.   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 HPI: Patient is a 75 year old gentleman with a Charcot rocker-bottom deformity. Patient is status post partial foot amputations. He has had progressive rocker-bottom deformity with ulceration and presents at this time for medial cuneiform excision to prevent further ulceration.  Past Medical History:  Diagnosis Date  . Anemia    hx low iron  . Arthritis   . Atrial fibrillation ([place])   . 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 Chronic pain   . Constipation   . COPD (chronic obstructive pulmonary disease) ([place])   . Coronary artery disease   . DVT, lower extremity ([place])    many years  . Dyspnea    with exertion  . Dysrhythmia    AFib  . Family history of adverse reaction to anesthesia    sister has difficulty waking up  . Gout   . Headache    occasional  . History of kidney stones   . History of kidney stones   . History of kidney stones   . Hyperlipidemia   . Hypertension   . Incisional hernia    x 2  . Myocardial infarction    3 stents  . Peripheral artery disease ([place])   . Pneumonia   . Umbilical hernia     Past Surgical History:  Procedure Laterality Date  . ABDOMINAL AORTAGRAM  05/04/2014   Procedure: ABDOMINAL Maxcine Ham;  Surgeon: Angelia Mould, MD;  Location: [facility] CATH LAB;  Service: Cardiovascular;;  . AMPUTATION Right 02/19/2014   Procedure: AMPUTATION RAY-RIGHT GREAT TOE;  Surgeon: Angelia Mould, MD;  Location: [place];  Service: Vascular;  Laterality: Right;  . AMPUTATION Right 05/08/2014   Procedure: 1st Ray and 5th Ray Amputation Right Foot;  Surgeon: Newt Minion, MD;  Location: [facility];  Service: Orthopedics;  Laterality: Right;  . ANKLE SURGERY Left   . BACK SURGERY     2 titanium rods   . CARDIAC CATHETERIZATION    . COLONOSCOPY    . CORONARY ANGIOPLASTY      RCA stent '05  . EYE SURGERY Bilateral    cataract surgery with lens implant  . FEMORAL-POPLITEAL BYPASS GRAFT    . HERNIA REPAIR  08/2015   umbicial  . LAPAROSCOPIC ASSISTED VENTRAL HERNIA REPAIR N/A 08/11/2015   Procedure: LAPAROSCOPIC ASSISTED VENTRAL WALL HERNIA REPAIR with mesh;  Surgeon: Michael Boston, MD;  Location: [facility];  Service: General;  Laterality: N/A;  . LAPAROSCOPIC LYSIS OF ADHESIONS N/A 08/11/2015   Procedure: LAPAROSCOPIC LYSIS OF ADHESIONS;  Surgeon: Michael Boston, MD;  Location: [facility];  Service: General;  Laterality: N/A;  . LOWER EXTREMITY ANGIOGRAM N/A 05/04/2014   Procedure: LOWER EXTREMITY ANGIOGRAM;  Surgeon: Angelia Mould, MD;  [facility];  Service: Cardiovascular;  Laterality: N/A;  . PERCUTANEOUS CORONARY STENT INTERVENTION (PCI-S)    . TONSILLECTOMY      Family History  Problem Relation Age of Onset  . Diabetes Mother   . Cancer Mother     Right Breast  . Heart disease Mother   . Hyperlipidemia Mother   . Hypertension Mother   . Lymphoma Mother     Chemo  . Cancer Father   . Alcohol abuse Sister   . Heart disease Sister   . Hyperlipidemia Sister   . Hypertension Sister   .  Stroke Sister   . Cancer Brother   . Heart disease Brother   . Depression Brother   . Early death Brother   . Hyperlipidemia Brother   . Hypertension Brother   . Heart disease Maternal Grandmother   . Kidney disease Maternal Grandmother   . Heart disease Maternal Grandfather   . Kidney disease Maternal Grandfather    Social History:  reports that he quit smoking about 17 years ago. His smoking use included Cigarettes. His smokeless tobacco use includes Snuff. He reports that he does not drink alcohol or use drugs.  Allergies:  Allergies  Allergen Reactions  . Zocor [Simvastatin] Hives    Medications Prior to Admission  Medication Sig Dispense Refill  . acetaminophen (TYLENOL) 500 MG tablet Take 500-1,000 mg by mouth every 6 (six) hours as needed  for moderate pain or headache.    . albuterol (PROVENTIL HFA;VENTOLIN HFA) 108 (90 BASE) MCG/ACT inhaler Inhale 2 puffs into the lungs every 6 (six) hours as needed for wheezing or shortness of breath. 1 Inhaler 2  . allopurinol (ZYLOPRIM) 100 MG tablet TAKE ONE TABLET BY MOUTH ONCE DAILY IN THE EVENING 90 tablet 2  . allopurinol (ZYLOPRIM) 300 MG tablet TAKE ONE TABLET BY MOUTH ONCE DAILY IN THE MORNING 90 tablet 3  . Ascorbic Acid (VITAMIN C PO) Take 1 tablet by mouth daily.    . Cholecalciferol (VITAMIN D-3 PO) Take 1 tablet by mouth daily.    . enalapril (VASOTEC) 10 MG tablet Take 1 tablet (10 mg total) by mouth daily. 90 tablet 1  . EPINEPHrine (EPI-PEN) 0.3 mg/0.3 mL SOAJ injection Inject 0.3 mLs (0.3 mg total) into the muscle once. (Patient taking differently: Inject 0.3 mg into the muscle once as needed (For anaphylaxis.). ) 1 Device 1  . furosemide (LASIX) 40 MG tablet TAKE ONE TABLET BY MOUTH ONCE DAILY 30 tablet 4  . lovastatin (MEVACOR) 20 MG tablet TAKE ONE TABLET BY MOUTH AT BEDTIME 90 tablet 2  . metoprolol tartrate (LOPRESSOR) 25 MG tablet Take 0.5 tablets (12.5 mg total) by mouth 2 (two) times daily. 60 tablet 6  . metroNIDAZOLE (METROGEL) 1 % gel Apply topically daily. For rosacea (Patient taking differently: Apply 1 application topically daily. For rosacea) 45 g 2  . Multiple Vitamins-Minerals (MENS MULTIVITAMIN PLUS PO) Take 1 tablet by mouth daily.    . nitroGLYCERIN (NITROSTAT) 0.4 MG SL tablet Place 1 tablet (0.4 mg total) under the tongue every 5 (five) minutes as needed for chest pain. 90 tablet 3  . polyethylene glycol powder (GLYCOLAX/MIRALAX) powder DISSOLVE 17G OF POWDER IN 8 OUNCES OF LIQUID AND DRINK 2 TIMES PER DAY AS NEEDED FOR MILD CONSTIPATION. (Patient taking differently: Take 17 g by mouth daily. ) 527 g 11  . potassium chloride SA (KLOR-CON M20) 20 MEQ tablet Take 1 tablet (20 mEq total) by mouth daily. 90 tablet 2  . tamsulosin (FLOMAX) 0.4 MG CAPS capsule Take  0.4 mg by mouth daily.    [name] triamcinolone ointment (KENALOG) 0.5 % Apply 1 application topically 2 (two) times daily as needed. (Patient taking differently: Apply 1 application topically 2 (two) times daily as needed (Apply to affected area.). ) 30 g 2  . warfarin (COUMADIN) 5 MG tablet Take 1 tablet (5 mg total) by mouth daily. Or as directed (Patient taking differently: Take 2.5-5 mg by mouth every evening. Take half a tablet (2.5mg ) on Monday and Friday and one tablet (5mg ) the rest of the week.) 90 tablet 3  No results found for this or any previous visit (from the past 48 hour(s)). No results found.  ROS  There were no vitals taken for this visit. Physical Exam  Patient is alert oriented no adenopathy well-dressed normal affect normal respiratory effort he has an antalgic gait he has a Charcot rocker-bottom deformity with ulceration over the rocker-bottom deformity. Assessment/Plan Assessment: Charcot rocker-bottom deformity with collapse of the medial cuneiform.  Plan: We will plan for excision of the medial cuneiform risk and benefits were discussed including risk of infection neurovascular injury nonhealing the wound need for additional surgery. Patient states he understands wish to proceed at this time.  Newt Minion, MD 12/15/2015, 6:31 AM

## 2015-12-15 NOTE — Progress Notes (Signed)
Orthopedic Tech Progress Note Patient Details:  Terry Martinez [birth year] [id number] Viewed order from doctor's order list Ortho Devices Type of Ortho Device: Crutches, Postop shoe/boot Ortho Device/Splint Location: rle Ortho Device/Splint Interventions: Application   Terry Martinez 12/15/2015, 10:26 AM

## 2015-12-16 ENCOUNTER — Encounter (HOSPITAL_COMMUNITY): Payer: Self-pay | Admitting: Orthopedic Surgery

## 2015-12-16 NOTE — Anesthesia Postprocedure Evaluation (Signed)
Anesthesia Post Note  Patient: Terry Martinez  Procedure(s) Performed: Procedure(s) (LRB): Right Foot Partial Excision Medial Cuneiform (Right)  Patient location during evaluation: PACU Anesthesia Type: MAC and Regional Level of consciousness: awake and alert Pain management: pain level controlled Vital Signs Assessment: post-procedure vital signs reviewed and stable Respiratory status: spontaneous breathing, nonlabored ventilation, respiratory function stable and patient connected to nasal cannula oxygen Cardiovascular status: stable and blood pressure returned to baseline Anesthetic complications: no    Last Vitals:  Vitals:   12/15/15 0953 12/15/15 1005  BP:  (!) 151/58  Pulse:  61  Resp:  18  Temp: 36.3 C     Last Pain:  Vitals:   12/15/15 0953  TempSrc:   PainSc: 0-No pain                 Dieter Hane S

## 2015-12-16 NOTE — Telephone Encounter (Signed)
Duplicate message, this was addressed by Autumn, patient is actually scheduled for appointment tomorrow for evaluation.

## 2015-12-16 NOTE — Telephone Encounter (Signed)
Pt. Wife called back to schedule 1 week PO visit.

## 2015-12-16 NOTE — Telephone Encounter (Signed)
:  I had called pt and he placed his sister on the phone. She wanted an appt for next week I advised that we were not able to do that we did not have any time But Dr. Jess Barters protocol can be for 1-2 week follow up. The pt's sister stated that she was concerned because his toe was purple. I advised that can not wait and made and appt with erin tomorrow. The pt's sister states that it could wait and I explained why any post op changes like skin discoloration and excessive swelling dispute elevation as she states he was doing needs eval in office appt made for

## 2015-12-17 ENCOUNTER — Inpatient Hospital Stay (INDEPENDENT_AMBULATORY_CARE_PROVIDER_SITE_OTHER): Payer: Medicare Other | Admitting: Family

## 2015-12-17 NOTE — Telephone Encounter (Signed)
PT. Wife called stating Pts toes were not purple this morning and PT wanted to cancel appt. For today and needed 1 week hospital follow up with Dr. Sharol Given from surgery.

## 2015-12-17 NOTE — Telephone Encounter (Signed)
I called and sw pts wife to make follow up appt the first available today is 12/27/15 she said that Dr. Sharol Given said to come back in a week. I advised the pt that our standard post op instructions are 1-2 week follow up. She was under the assumption that stiches would be removed at this visit. I advised that these do not come out until week 2. She said that she " hoped I would assume all responsibility if something happened to the pt" I advised that the pt had an appt today. They did not keep the appt. She states that it is bc the pt felt better. I told her to call and let us know if he was not doing well but that he was set for 12/27/15

## 2015-12-19 ENCOUNTER — Other Ambulatory Visit: Payer: Self-pay | Admitting: Family Medicine

## 2015-12-21 ENCOUNTER — Other Ambulatory Visit: Payer: Self-pay | Admitting: Family Medicine

## 2015-12-27 ENCOUNTER — Telehealth: Payer: Self-pay | Admitting: Family Medicine

## 2015-12-27 ENCOUNTER — Ambulatory Visit: Payer: Medicare Other | Admitting: Family

## 2015-12-27 ENCOUNTER — Ambulatory Visit (INDEPENDENT_AMBULATORY_CARE_PROVIDER_SITE_OTHER): Payer: Medicare Other | Admitting: Orthopedic Surgery

## 2015-12-27 VITALS — Ht 76.0 in | Wt 265.0 lb

## 2015-12-27 DIAGNOSIS — L97521 Non-pressure chronic ulcer of other part of left foot limited to breakdown of skin: Secondary | ICD-10-CM

## 2015-12-27 NOTE — Telephone Encounter (Signed)
Pt had a referral to see a dermatologist. The appointment had to be canceled since the person taking him was in the hospital. He called to make another appointment but the referral has expired. Can we send in another referral so that he can make an appointment. jw

## 2015-12-27 NOTE — Progress Notes (Signed)
Wound Care Note   Patient: Terry Martinez           Date of Birth: 08/19/1940           MRN: [id number]             PCP: Lavon Paganini, MD Visit Date: 12/27/2015   Assessment & Plan: Visit Diagnoses:  1. Ulcer of toe of left foot, limited to breakdown of skin ([place])     Plan: Follow-up in 2 weeks. Patient will start Dial soap cleansing daily minimize weightbearing a prescription was provided for a medical compression sock to be worn around-the-clock   Follow-Up Instructions: Return in about 2 weeks (around 01/10/2016).  Orders:  No orders of the defined types were placed in this encounter.  No orders of the defined types were placed in this encounter.     Procedures: No notes on file   Clinical Data: No additional findings.   No images are attached to the encounter.   Subjective: Chief Complaint  Patient presents with  . Right Foot - Routine Post Op    12/15/15 right foot partial excision medial cuneiform     Patient is 1 week and 5 days out from a right foot partial excision medial cuneiform. Pt is weight bearing with a rolling walker. His leg is swollen above the bandage. The incision is red, slightly macerated and stitches are present. His is not taking any antibiotics and does not report having any pain.    Review of Systems   Objective: Vital Signs: Ht 6\' 4"  (1.93 m)   Wt 265 lb (120.2 kg)   BMI 32.26 kg/m   Physical Exam: Examination the wound has some slight maceration there is no drainage no odor no cellulitis no signs of infection. The wound edges have gaped open about a millimeter. Patient will continue with protected weightbearing.  Specialty Comments: No specialty comments available.   PMFS History: Patient Active Problem List   Diagnosis Date Noted  . Incarcerated incisional hernia s/p lap LOA & repair with mesh 08/11/2015 08/11/2015  . S/P Left axillobifemoral bypass graft 08/11/2015  . Obesity 10/05/2014  . Hx of basal cell carcinoma  08/27/2013  . Cough 02/06/2013  . PAD (peripheral artery disease) ([place]) 12/05/2012  . HTN (hypertension) 12/05/2012  . Atrial fibrillation ([place]) 12/05/2012  . HLD (hyperlipidemia) 12/05/2012  . CAD (coronary artery disease) 12/05/2012  . Gout 12/05/2012  . Preventative health care 12/05/2012  . Long term current use of anticoagulant therapy 09/04/2012  . Abnormal prostate specific antigen 05/31/2011   Past Medical History:  Diagnosis Date  . Anemia    hx low iron  . Arthritis   . Atrial fibrillation ([place])   . 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 Chronic pain   . Constipation   . COPD (chronic obstructive pulmonary disease) ([place])   . Coronary artery disease   . DVT, lower extremity ([place])    many years  . Dyspnea    with exertion  . Dysrhythmia    AFib  . Family history of adverse reaction to anesthesia    sister has difficulty waking up  . Gout   . Headache    occasional  . History of kidney stones   . History of kidney stones   . History of kidney stones   . Hyperlipidemia   . Hypertension   . Incisional hernia    x 2  .  Myocardial infarction    3 stents  . Peripheral artery disease ([place])   . Pneumonia   . Umbilical hernia     Family History  Problem Relation Age of Onset  . Diabetes Mother   . Cancer Mother     Right Breast  . Heart disease Mother   . Hyperlipidemia Mother   . Hypertension Mother   . Lymphoma Mother     Chemo  . Cancer Father   . Alcohol abuse Sister   . Heart disease Sister   . Hyperlipidemia Sister   . Hypertension Sister   . Stroke Sister   . Cancer Brother   . Heart disease Brother   . Depression Brother   . Early death Brother   . Hyperlipidemia Brother   . Hypertension Brother   . Heart disease Maternal Grandmother   . Kidney disease Maternal Grandmother   . Heart disease Maternal Grandfather   . Kidney disease Maternal Grandfather    Past Surgical History:  Procedure  Laterality Date  . ABDOMINAL AORTAGRAM  05/04/2014   Procedure: ABDOMINAL Maxcine Ham;  Surgeon: Angelia Mould, MD;  Location: [facility] CATH LAB;  Service: Cardiovascular;;  . AMPUTATION Right 02/19/2014   Procedure: AMPUTATION RAY-RIGHT GREAT TOE;  Surgeon: Angelia Mould, MD;  Location: [place];  Service: Vascular;  Laterality: Right;  . AMPUTATION Right 05/08/2014   Procedure: 1st Ray and 5th Ray Amputation Right Foot;  Surgeon: Newt Minion, MD;  Location: [place];  Service: Orthopedics;  Laterality: Right;  . ANKLE SURGERY Left   . BACK SURGERY     2 titanium rods   . CARDIAC CATHETERIZATION    . COLONOSCOPY    . CORONARY ANGIOPLASTY     RCA stent '05  . EYE SURGERY Bilateral    cataract surgery with lens implant  . FEMORAL-POPLITEAL BYPASS GRAFT    . HERNIA REPAIR  08/2015   umbicial  . I&D EXTREMITY Right 12/15/2015   Procedure: Right Foot Partial Excision Medial Cuneiform;  Surgeon: Newt Minion, MD;  Location: [place];  Service: Orthopedics;  Laterality: Right;  . LAPAROSCOPIC ASSISTED VENTRAL HERNIA REPAIR N/A 08/11/2015   Procedure: LAPAROSCOPIC ASSISTED VENTRAL WALL HERNIA REPAIR with mesh;  Surgeon: Michael Boston, MD;  [facility];  Service: General;  Laterality: N/A;  . LAPAROSCOPIC LYSIS OF ADHESIONS N/A 08/11/2015   Procedure: LAPAROSCOPIC LYSIS OF ADHESIONS;  Surgeon: Michael Boston, MD;  Location: [facility];  Service: General;  Laterality: N/A;  . LOWER EXTREMITY ANGIOGRAM N/A 05/04/2014   Procedure: LOWER EXTREMITY ANGIOGRAM;  Surgeon: Angelia Mould, MD;  Location: [facility] CATH LAB;  Service: Cardiovascular;  Laterality: N/A;  . PERCUTANEOUS CORONARY STENT INTERVENTION (PCI-S)    . TONSILLECTOMY     Social History   Occupational History  . Not on file.   Social History Main Topics  . Smoking status: Former Smoker    Types: Cigarettes    Quit date: 07/22/1998  . Smokeless tobacco: Current User    Types: Snuff  . Alcohol use No  . Drug use: No  . Sexual  activity: No

## 2015-12-28 NOTE — Telephone Encounter (Signed)
Referral faxed back to [facility].

## 2016-01-03 ENCOUNTER — Ambulatory Visit (INDEPENDENT_AMBULATORY_CARE_PROVIDER_SITE_OTHER): Payer: Medicare Other | Admitting: *Deleted

## 2016-01-03 DIAGNOSIS — R7303 Prediabetes: Secondary | ICD-10-CM | POA: Diagnosis present

## 2016-01-03 LAB — POCT INR: INR: 2

## 2016-01-03 LAB — POCT GLYCOSYLATED HEMOGLOBIN (HGB A1C): HEMOGLOBIN A1C: 5.8

## 2016-01-10 ENCOUNTER — Encounter (INDEPENDENT_AMBULATORY_CARE_PROVIDER_SITE_OTHER): Payer: Self-pay | Admitting: Orthopedic Surgery

## 2016-01-10 ENCOUNTER — Ambulatory Visit (INDEPENDENT_AMBULATORY_CARE_PROVIDER_SITE_OTHER): Payer: Medicare Other | Admitting: Orthopedic Surgery

## 2016-01-10 DIAGNOSIS — Z89411 Acquired absence of right great toe: Secondary | ICD-10-CM

## 2016-01-10 DIAGNOSIS — L570 Actinic keratosis: Secondary | ICD-10-CM | POA: Diagnosis not present

## 2016-01-10 DIAGNOSIS — L821 Other seborrheic keratosis: Secondary | ICD-10-CM | POA: Diagnosis not present

## 2016-01-10 DIAGNOSIS — C44319 Basal cell carcinoma of skin of other parts of face: Secondary | ICD-10-CM | POA: Diagnosis not present

## 2016-01-10 DIAGNOSIS — IMO0002 Reserved for concepts with insufficient information to code with codable children: Secondary | ICD-10-CM

## 2016-01-10 DIAGNOSIS — L578 Other skin changes due to chronic exposure to nonionizing radiation: Secondary | ICD-10-CM | POA: Diagnosis not present

## 2016-01-10 NOTE — Progress Notes (Signed)
Wound Care Note   Patient: Terry Martinez           Date of Birth: 03-19-1940           MRN: [id number]             PCP: Lavon Paganini, MD Visit Date: 01/10/2016   Assessment & Plan: Visit Diagnoses:  1. Great toe amputation status, right ([place])     Plan: Follow-up in 2 weeks. Continue with compression stocking change daily. Continue elevation continue with minimal weightbearing is currently using a rolling walker.   Follow-Up Instructions: Return in about 2 weeks (around 01/24/2016).  Orders:  No orders of the defined types were placed in this encounter.  No orders of the defined types were placed in this encounter.     Procedures: No notes on file   Clinical Data: No additional findings.   No images are attached to the encounter.   Subjective: Chief Complaint  Patient presents with  . Right Foot - Routine Post Op    12/15/15 right foot partial excision medial cuneiform     Patient is 3.5 weeks s/p a right partial excision of the medial cuneiform. Patient is full weight bearing with a rolling walker in a Vive compression sock and extra depth shoe with  Orthotic. He states that he does keep his foot elevated " every night" when questioned about his day time activities he states that he tries. Stitches were removed today. There is a small open area at the proximal end of the incision with 100% yellow tissue. He does also complain of his thirst toe being red and has a very small scab on the end of the toe. Not on oral ABX    Review of Systems  Miscellaneous:  -  Objective: Vital Signs: Ht 6\' 4"  (1.93 m)   Wt 265 lb (120.2 kg)   BMI 32.26 kg/m   Physical Exam: On examination patient's wound is well approximated he has one area of granulation tissue which is about 3 x 5 mm in 0.1 mm deep this does not probe to bone there is no cellulitis no odor no drainage no signs of infection. The sutures are harvested the importance of protected weightbearing was  discussed.  Specialty Comments: No specialty comments available.   PMFS History: Patient Active Problem List   Diagnosis Date Noted  . Great toe amputation status, right ([place]) 01/10/2016  . Incarcerated incisional hernia s/p lap LOA & repair with mesh 08/11/2015 08/11/2015  . S/P Left axillobifemoral bypass graft 08/11/2015  . Obesity 10/05/2014  . Hx of basal cell carcinoma 08/27/2013  . Cough 02/06/2013  . PAD (peripheral artery disease) ([place]) 12/05/2012  . HTN (hypertension) 12/05/2012  . Atrial fibrillation ([place]) 12/05/2012  . HLD (hyperlipidemia) 12/05/2012  . CAD (coronary artery disease) 12/05/2012  . Gout 12/05/2012  . Preventative health care 12/05/2012  . Long term current use of anticoagulant therapy 09/04/2012  . Abnormal prostate specific antigen 05/31/2011   Past Medical History:  Diagnosis Date  . Anemia    hx low iron  . Arthritis   . Atrial fibrillation ([place])   . 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 Chronic pain   . Constipation   . COPD (chronic obstructive pulmonary disease) ([place])   . Coronary artery disease   . DVT, lower extremity ([place])    many years  . Dyspnea    with exertion  .  Dysrhythmia    AFib  . Family history of adverse reaction to anesthesia    sister has difficulty waking up  . Gout   . Headache    occasional  . History of kidney stones   . History of kidney stones   . History of kidney stones   . Hyperlipidemia   . Hypertension   . Incisional hernia    x 2  . Myocardial infarction    3 stents  . Peripheral artery disease ([place])   . Pneumonia   . Umbilical hernia     Family History  Problem Relation Age of Onset  . Diabetes Mother   . Cancer Mother     Right Breast  . Heart disease Mother   . Hyperlipidemia Mother   . Hypertension Mother   . Lymphoma Mother     Chemo  . Cancer Father   . Alcohol abuse Sister   . Heart disease Sister   . Hyperlipidemia Sister   .  Hypertension Sister   . Stroke Sister   . Cancer Brother   . Heart disease Brother   . Depression Brother   . Early death Brother   . Hyperlipidemia Brother   . Hypertension Brother   . Heart disease Maternal Grandmother   . Kidney disease Maternal Grandmother   . Heart disease Maternal Grandfather   . Kidney disease Maternal Grandfather    Past Surgical History:  Procedure Laterality Date  . ABDOMINAL AORTAGRAM  05/04/2014   Procedure: ABDOMINAL Maxcine Ham;  Surgeon: Angelia Mould, MD;  Location: [facility] CATH LAB;  Service: Cardiovascular;;  . AMPUTATION Right 02/19/2014   Procedure: AMPUTATION RAY-RIGHT GREAT TOE;  Surgeon: Angelia Mould, MD;  Location: [place];  Service: Vascular;  Laterality: Right;  . AMPUTATION Right 05/08/2014   Procedure: 1st Ray and 5th Ray Amputation Right Foot;  Surgeon: Newt Minion, MD;  Location: [place];  Service: Orthopedics;  Laterality: Right;  . ANKLE SURGERY Left   . BACK SURGERY     2 titanium rods   . CARDIAC CATHETERIZATION    . COLONOSCOPY    . CORONARY ANGIOPLASTY     RCA stent '05  . EYE SURGERY Bilateral    cataract surgery with lens implant  . FEMORAL-POPLITEAL BYPASS GRAFT    . HERNIA REPAIR  08/2015   umbicial  . I&D EXTREMITY Right 12/15/2015   Procedure: Right Foot Partial Excision Medial Cuneiform;  Surgeon: Newt Minion, MD;  Location: [place];  Service: Orthopedics;  Laterality: Right;  . LAPAROSCOPIC ASSISTED VENTRAL HERNIA REPAIR N/A 08/11/2015   Procedure: LAPAROSCOPIC ASSISTED VENTRAL WALL HERNIA REPAIR with mesh;  Surgeon: Michael Boston, MD;  [facility];  Service: General;  Laterality: N/A;  . LAPAROSCOPIC LYSIS OF ADHESIONS N/A 08/11/2015   Procedure: LAPAROSCOPIC LYSIS OF ADHESIONS;  Surgeon: Michael Boston, MD;  [facility];  Service: General;  Laterality: N/A;  . LOWER EXTREMITY ANGIOGRAM N/A 05/04/2014   Procedure: LOWER EXTREMITY ANGIOGRAM;  Surgeon: Angelia Mould, MD;  Location: [facility] CATH LAB;   Service: Cardiovascular;  Laterality: N/A;  . PERCUTANEOUS CORONARY STENT INTERVENTION (PCI-S)    . TONSILLECTOMY     Social History   Occupational History  . Not on file.   Social History Main Topics  . Smoking status: Former Smoker    Types: Cigarettes    Quit date: 07/22/1998  . Smokeless tobacco: Current User    Types: Snuff  . Alcohol use No  . Drug use:  No  . Sexual activity: No

## 2016-01-24 ENCOUNTER — Encounter (INDEPENDENT_AMBULATORY_CARE_PROVIDER_SITE_OTHER): Payer: Self-pay | Admitting: Orthopedic Surgery

## 2016-01-24 ENCOUNTER — Ambulatory Visit (INDEPENDENT_AMBULATORY_CARE_PROVIDER_SITE_OTHER): Payer: Medicare Other | Admitting: Orthopedic Surgery

## 2016-01-24 VITALS — Ht 76.0 in | Wt 265.0 lb

## 2016-01-24 DIAGNOSIS — I872 Venous insufficiency (chronic) (peripheral): Secondary | ICD-10-CM | POA: Insufficient documentation

## 2016-01-24 DIAGNOSIS — E11610 Type 2 diabetes mellitus with diabetic neuropathic arthropathy: Secondary | ICD-10-CM

## 2016-01-24 NOTE — Progress Notes (Signed)
Office Visit Note   Patient: Terry Martinez           Date of Birth: 14-Dec-1940           MRN: [id number] Visit Date: 01/24/2016              Requested by: [name], MD [place], [place] [address] PCP: Lavon Paganini, MD   Assessment & Plan: Visit Diagnoses:  1. 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 Venous stasis dermatitis of both lower extremities     Plan: Patient is showing excellent improvement with his surgery. The venous stasis edema is resolving nicely with the medical compression sock the plantar ulcer has completely healed and the surgical incision has one small area of 5 mm of granulation tissue which was touched with silver nitrate. He will continue with daily dressing changes of the compression sock he will wear this for a week straight when family leaves and he cannot change the sock on his own follow-up in the office in 4 weeks.  Follow-Up Instructions: Return in about 4 weeks (around 02/21/2016).   Orders:  No orders of the defined types were placed in this encounter.  No orders of the defined types were placed in this encounter.     Procedures: No procedures performed   Clinical Data: No additional findings.   Subjective: Chief Complaint  Patient presents with  . Right Foot - Routine Post Op    Patient is returning for a 2 week recheck on Right foot partial excision medial cuneiform. Small opening at incision site. With minimal to no drainage. Waring socks, changing daily.     Review of Systems   Objective: Vital Signs: Ht 6\' 4"  (1.93 m)   Wt 265 lb (120.2 kg)   BMI 32.26 kg/m   Physical Exam patient is healing quite nicely the venous stasis swelling is resolving there is no cellulitis the skin is less dry last dry flaking skin. Patient has a granulating wound proximal aspect of the surgical incision which is 5 mm in diameter 0.1 mm deep. The plantar ulcer is completely healed from the Charcot rocker-bottom  deformity.  Ortho Exam  Specialty Comments:  No specialty comments available.  Imaging: No results found.   PMFS History: Patient Active Problem List   Diagnosis Date Noted  . 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 Venous stasis dermatitis of both lower extremities 01/24/2016  . Great toe amputation status, right ([place]) 01/10/2016  . Incarcerated incisional hernia s/p lap LOA & repair with mesh 08/11/2015 08/11/2015  . S/P Left axillobifemoral bypass graft 08/11/2015  . Obesity 10/05/2014  . Hx of basal cell carcinoma 08/27/2013  . Cough 02/06/2013  . PAD (peripheral artery disease) ([place]) 12/05/2012  . HTN (hypertension) 12/05/2012  . Atrial fibrillation ([place]) 12/05/2012  . HLD (hyperlipidemia) 12/05/2012  . CAD (coronary artery disease) 12/05/2012  . Gout 12/05/2012  . Preventative health care 12/05/2012  . Long term current use of anticoagulant therapy 09/04/2012  . Abnormal prostate specific antigen 05/31/2011   Past Medical History:  Diagnosis Date  . Anemia    hx low iron  . Arthritis   . Atrial fibrillation ([place])   . 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 Chronic pain   . Constipation   . COPD (chronic obstructive pulmonary disease) ([place])   . Coronary artery disease   . 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  DVT, lower extremity ([place])    many years  . Dyspnea    with exertion  . Dysrhythmia    AFib  . Family history of adverse reaction to anesthesia    sister has difficulty waking up  . Gout   . Headache    occasional  . History of kidney stones   . History of kidney stones   . History of kidney stones   . Hyperlipidemia   . Hypertension   . Incisional hernia    x 2  . Myocardial infarction    3 stents  . Peripheral artery disease ([place])   . Pneumonia   . Umbilical hernia     Family History  Problem Relation Age of Onset  . Diabetes Mother   . Cancer Mother     Right Breast   . Heart disease Mother   . Hyperlipidemia Mother   . Hypertension Mother   . Lymphoma Mother     Chemo  . Cancer Father   . Alcohol abuse Sister   . Heart disease Sister   . Hyperlipidemia Sister   . Hypertension Sister   . Stroke Sister   . Cancer Brother   . Heart disease Brother   . Depression Brother   . Early death Brother   . Hyperlipidemia Brother   . Hypertension Brother   . Heart disease Maternal Grandmother   . Kidney disease Maternal Grandmother   . Heart disease Maternal Grandfather   . Kidney disease Maternal Grandfather     Past Surgical History:  Procedure Laterality Date  . ABDOMINAL AORTAGRAM  05/04/2014   Procedure: ABDOMINAL Maxcine Ham;  Surgeon: Angelia Mould, MD;  Location: [facility] CATH LAB;  Service: Cardiovascular;;  . AMPUTATION Right 02/19/2014   Procedure: AMPUTATION RAY-RIGHT GREAT TOE;  Surgeon: Angelia Mould, MD;  Location: [place];  Service: Vascular;  Laterality: Right;  . AMPUTATION Right 05/08/2014   Procedure: 1st Ray and 5th Ray Amputation Right Foot;  Surgeon: Newt Minion, MD;  Location: [place];  Service: Orthopedics;  Laterality: Right;  . ANKLE SURGERY Left   . BACK SURGERY     2 titanium rods   . CARDIAC CATHETERIZATION    . COLONOSCOPY    . CORONARY ANGIOPLASTY     RCA stent '05  . EYE SURGERY Bilateral    cataract surgery with lens implant  . FEMORAL-POPLITEAL BYPASS GRAFT    . HERNIA REPAIR  08/2015   umbicial  . I&D EXTREMITY Right 12/15/2015   Procedure: Right Foot Partial Excision Medial Cuneiform;  Surgeon: Newt Minion, MD;  Location: [facility];  Service: Orthopedics;  Laterality: Right;  . LAPAROSCOPIC ASSISTED VENTRAL HERNIA REPAIR N/A 08/11/2015   Procedure: LAPAROSCOPIC ASSISTED VENTRAL WALL HERNIA REPAIR with mesh;  Surgeon: Michael Boston, MD;  [facility];  Service: General;  Laterality: N/A;  . LAPAROSCOPIC LYSIS OF ADHESIONS N/A 08/11/2015   Procedure: LAPAROSCOPIC LYSIS OF ADHESIONS;  Surgeon: Michael Boston,  MD;  [facility];  Service: General;  Laterality: N/A;  . LOWER EXTREMITY ANGIOGRAM N/A 05/04/2014   Procedure: LOWER EXTREMITY ANGIOGRAM;  Surgeon: Angelia Mould, MD;  Location: [facility] CATH LAB;  Service: Cardiovascular;  Laterality: N/A;  . PERCUTANEOUS CORONARY STENT INTERVENTION (PCI-S)    . TONSILLECTOMY     Social History   Occupational History  . Not on file.   Social History Main Topics  . Smoking status: Former Smoker    Types: Cigarettes    Quit date: 07/22/1998  .  Smokeless tobacco: Current User    Types: Snuff  . Alcohol use No  . Drug use: No  . Sexual activity: No

## 2016-01-26 ENCOUNTER — Ambulatory Visit (INDEPENDENT_AMBULATORY_CARE_PROVIDER_SITE_OTHER): Payer: Medicare Other | Admitting: *Deleted

## 2016-01-26 DIAGNOSIS — R7303 Prediabetes: Secondary | ICD-10-CM | POA: Diagnosis not present

## 2016-01-26 LAB — POCT INR: INR: 2.9

## 2016-01-28 ENCOUNTER — Ambulatory Visit: Payer: Medicare Other

## 2016-02-21 HISTORY — PX: BASAL CELL CARCINOMA EXCISION: SHX1214

## 2016-02-22 ENCOUNTER — Ambulatory Visit (INDEPENDENT_AMBULATORY_CARE_PROVIDER_SITE_OTHER): Payer: Medicare Other | Admitting: Orthopedic Surgery

## 2016-02-23 ENCOUNTER — Ambulatory Visit (INDEPENDENT_AMBULATORY_CARE_PROVIDER_SITE_OTHER): Payer: Medicare Other | Admitting: *Deleted

## 2016-02-23 ENCOUNTER — Other Ambulatory Visit: Payer: Self-pay | Admitting: Family Medicine

## 2016-02-23 ENCOUNTER — Telehealth (HOSPITAL_COMMUNITY): Payer: Self-pay | Admitting: Family Medicine

## 2016-02-23 ENCOUNTER — Other Ambulatory Visit: Payer: Self-pay | Admitting: *Deleted

## 2016-02-23 DIAGNOSIS — I4891 Unspecified atrial fibrillation: Secondary | ICD-10-CM

## 2016-02-23 LAB — POCT INR: INR: 3

## 2016-02-23 MED ORDER — FUROSEMIDE 40 MG PO TABS: 40.0000 mg | ORAL_TABLET | Freq: Every day | ORAL | 4 refills | Status: DC

## 2016-02-23 NOTE — Telephone Encounter (Signed)
-----   Message from [place] Pink, [place] sent at 02/23/2016  3:06 PM EST ----- Regarding: Rx refill Mr Creese needs a refill on his lasix. [place], Kevin [place]

## 2016-03-01 ENCOUNTER — Ambulatory Visit (INDEPENDENT_AMBULATORY_CARE_PROVIDER_SITE_OTHER): Payer: Medicare Other | Admitting: Orthopedic Surgery

## 2016-03-02 ENCOUNTER — Encounter (INDEPENDENT_AMBULATORY_CARE_PROVIDER_SITE_OTHER): Payer: Self-pay | Admitting: Orthopedic Surgery

## 2016-03-02 ENCOUNTER — Ambulatory Visit (INDEPENDENT_AMBULATORY_CARE_PROVIDER_SITE_OTHER): Payer: Medicare Other | Admitting: Orthopedic Surgery

## 2016-03-02 VITALS — Ht 76.0 in | Wt 265.0 lb

## 2016-03-02 DIAGNOSIS — Z89411 Acquired absence of right great toe: Secondary | ICD-10-CM

## 2016-03-02 DIAGNOSIS — I872 Venous insufficiency (chronic) (peripheral): Secondary | ICD-10-CM

## 2016-03-02 NOTE — Progress Notes (Signed)
Office Visit Note   Patient: Terry Martinez           Date of Birth: 1940/11/14           MRN: [id number] Visit Date: 03/02/2016              Requested by: [name], MD [place], [place] [address] PCP: Lavon Paganini, MD  Chief Complaint  Patient presents with  . Right Foot - Routine Post Op     Right Foot Partial Excision Medial Cuneiform    HPI: Patient's incision is well healed. He is weight bearing with a cane in regular shoe wear. The third toe is swollen and does have a small dry scab on the tip. His leg is slightly swollen the sock that he is wearing is not pulled up all the way and has caused an indention in the skin. Pamella Pert, RMA   Patient states his foot feels much better. Assessment & Plan: Visit Diagnoses:  1. Venous stasis dermatitis of both lower extremities   2. Acquired absence of right great toe [facility])     Plan: Patient given a prescription for [employer] to obtain extra depth shoes custom orthotics status post first and second Ray amputation with excision of the medial cuneiform for Charcot collapse.  Follow-Up Instructions: Return in about 3 months (around 05/31/2016).   Ortho Exam Examination patient does have a new ulcer over the tip of the third toe. Examination his shoe wear though it is at size 15 is too small for his foot. A Band-Aid was applied patient was given instructions for new extra-depth shoes and custom orthotics. The surgical incision is well-healed his foot is plantigrade of the ulcer on the plantar aspect of his foot is completely healed.  Imaging: No results found.  Orders:  No orders of the defined types were placed in this encounter.  No orders of the defined types were placed in this encounter.    Procedures: No procedures performed  Clinical Data: No additional findings.  Subjective: Review of Systems  Objective: Vital Signs: Ht 6\' 4"  (1.93 m)   Wt 265 lb (120.2 kg)   BMI 32.26 kg/m    Specialty Comments:  No specialty comments available.  PMFS History: Patient Active Problem List   Diagnosis Date Noted  . 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 Venous stasis dermatitis of both lower extremities 01/24/2016  . Great toe amputation status, right ([place]) 01/10/2016  . Incarcerated incisional hernia s/p lap LOA & repair with mesh 08/11/2015 08/11/2015  . S/P Left axillobifemoral bypass graft 08/11/2015  . Obesity 10/05/2014  . Hx of basal cell carcinoma 08/27/2013  . Cough 02/06/2013  . PAD (peripheral artery disease) ([place]) 12/05/2012  . HTN (hypertension) 12/05/2012  . Atrial fibrillation ([place]) 12/05/2012  . HLD (hyperlipidemia) 12/05/2012  . CAD (coronary artery disease) 12/05/2012  . Gout 12/05/2012  . Preventative health care 12/05/2012  . Long term current use of anticoagulant therapy 09/04/2012  . Abnormal prostate specific antigen 05/31/2011   Past Medical History:  Diagnosis Date  . Anemia    hx low iron  . Arthritis   . Atrial fibrillation ([place])   . 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 Chronic pain   . Constipation   . COPD (chronic obstructive pulmonary disease) ([place])   . Coronary artery disease   .  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 DVT, lower extremity ([place])    many years  . Dyspnea    with exertion  . Dysrhythmia    AFib  . Family history of adverse reaction to anesthesia    sister has difficulty waking up  . Gout   . Headache    occasional  . History of kidney stones   . History of kidney stones   . History of kidney stones   . Hyperlipidemia   . Hypertension   . Incisional hernia    x 2  . Myocardial infarction    3 stents  . Peripheral artery disease ([place])   . Pneumonia   . Umbilical hernia     Family History  Problem Relation Age of Onset  . Diabetes Mother   . Cancer Mother     Right Breast   . Heart disease Mother   . Hyperlipidemia Mother   . Hypertension Mother   . Lymphoma Mother     Chemo  . Cancer Father   . Alcohol abuse Sister   . Heart disease Sister   . Hyperlipidemia Sister   . Hypertension Sister   . Stroke Sister   . Cancer Brother   . Heart disease Brother   . Depression Brother   . Early death Brother   . Hyperlipidemia Brother   . Hypertension Brother   . Heart disease Maternal Grandmother   . Kidney disease Maternal Grandmother   . Heart disease Maternal Grandfather   . Kidney disease Maternal Grandfather     Past Surgical History:  Procedure Laterality Date  . ABDOMINAL AORTAGRAM  05/04/2014   Procedure: ABDOMINAL Maxcine Ham;  Surgeon: Angelia Mould, MD;  Location: [facility] CATH LAB;  Service: Cardiovascular;;  . AMPUTATION Right 02/19/2014   Procedure: AMPUTATION RAY-RIGHT GREAT TOE;  Surgeon: Angelia Mould, MD;  Location: [place];  Service: Vascular;  Laterality: Right;  . AMPUTATION Right 05/08/2014   Procedure: 1st Ray and 5th Ray Amputation Right Foot;  Surgeon: Newt Minion, MD;  Location: [facility];  Service: Orthopedics;  Laterality: Right;  . ANKLE SURGERY Left   . BACK SURGERY     2 titanium rods   . CARDIAC CATHETERIZATION    . COLONOSCOPY    . CORONARY ANGIOPLASTY     RCA stent '05  . EYE SURGERY Bilateral    cataract surgery with lens implant  . FEMORAL-POPLITEAL BYPASS GRAFT    . HERNIA REPAIR  08/2015   umbicial  . I&D EXTREMITY Right 12/15/2015   Procedure: Right Foot Partial Excision Medial Cuneiform;  Surgeon: Newt Minion, MD;  Location: [place];  Service: Orthopedics;  Laterality: Right;  . LAPAROSCOPIC ASSISTED VENTRAL HERNIA REPAIR N/A 08/11/2015   Procedure: LAPAROSCOPIC ASSISTED VENTRAL WALL HERNIA REPAIR with mesh;  Surgeon: Michael Boston, MD;  [facility];  Service: General;  Laterality: N/A;  . LAPAROSCOPIC LYSIS OF ADHESIONS N/A 08/11/2015   Procedure: LAPAROSCOPIC LYSIS OF ADHESIONS;  Surgeon: Michael Boston,  MD;  [facility];  Service: General;  Laterality: N/A;  . LOWER EXTREMITY ANGIOGRAM N/A 05/04/2014   Procedure: LOWER EXTREMITY ANGIOGRAM;  Surgeon: Angelia Mould, MD;  Location: [facility] CATH LAB;  Service: Cardiovascular;  Laterality: N/A;  . PERCUTANEOUS CORONARY STENT INTERVENTION (PCI-S)    . TONSILLECTOMY     Social History   Occupational History  . Not on file.   Social History Main Topics  . Smoking status: Former Smoker    Types:  Cigarettes    Quit date: 07/22/1998  . Smokeless tobacco: Current User    Types: Snuff  . Alcohol use No  . Drug use: No  . Sexual activity: No

## 2016-03-16 ENCOUNTER — Other Ambulatory Visit: Payer: Self-pay | Admitting: Family Medicine

## 2016-03-16 DIAGNOSIS — C44319 Basal cell carcinoma of skin of other parts of face: Secondary | ICD-10-CM | POA: Diagnosis not present

## 2016-03-22 ENCOUNTER — Ambulatory Visit: Payer: Medicare Other

## 2016-03-23 ENCOUNTER — Ambulatory Visit (INDEPENDENT_AMBULATORY_CARE_PROVIDER_SITE_OTHER): Payer: Medicare Other | Admitting: *Deleted

## 2016-03-23 DIAGNOSIS — I4891 Unspecified atrial fibrillation: Secondary | ICD-10-CM | POA: Diagnosis present

## 2016-03-23 LAB — POCT INR: INR: 2.6

## 2016-03-31 ENCOUNTER — Other Ambulatory Visit: Payer: Self-pay | Admitting: Family Medicine

## 2016-03-31 NOTE — Telephone Encounter (Signed)
Needs refill on allopurinol.  [facility] [address] in [place]. He is completely out

## 2016-04-03 DIAGNOSIS — L820 Inflamed seborrheic keratosis: Secondary | ICD-10-CM | POA: Diagnosis not present

## 2016-04-03 MED ORDER — ALLOPURINOL 300 MG PO TABS: ORAL_TABLET | ORAL | 3 refills | Status: DC

## 2016-04-04 ENCOUNTER — Encounter: Payer: Self-pay | Admitting: Vascular Surgery

## 2016-04-12 ENCOUNTER — Encounter: Payer: Self-pay | Admitting: Vascular Surgery

## 2016-04-12 ENCOUNTER — Ambulatory Visit (HOSPITAL_COMMUNITY)
Admission: RE | Admit: 2016-04-12 | Discharge: 2016-04-12 | Disposition: A | Discharge status: Home / Self Care | Payer: Medicare Other | Source: Ambulatory Visit | Attending: Vascular Surgery | Admitting: Vascular Surgery

## 2016-04-12 ENCOUNTER — Ambulatory Visit (INDEPENDENT_AMBULATORY_CARE_PROVIDER_SITE_OTHER): Payer: Medicare Other | Admitting: Vascular Surgery

## 2016-04-12 VITALS — BP 137/78 | HR 65 | Temp 97.8°F | Resp 20 | Ht 76.0 in | Wt 297.0 lb

## 2016-04-12 DIAGNOSIS — I70203 Unspecified atherosclerosis of native arteries of extremities, bilateral legs: Secondary | ICD-10-CM | POA: Diagnosis not present

## 2016-04-12 DIAGNOSIS — Z95820 Peripheral vascular angioplasty status with implants and grafts: Secondary | ICD-10-CM | POA: Diagnosis not present

## 2016-04-12 DIAGNOSIS — I70209 Unspecified atherosclerosis of native arteries of extremities, unspecified extremity: Secondary | ICD-10-CM | POA: Insufficient documentation

## 2016-04-12 NOTE — Progress Notes (Signed)
Patient name: Terry Martinez MRN: [id number] DOB: 1940/08/12 Sex: male  REASON FOR VISIT: Follow up of peripheral vascular disease.  HPI: Terry Martinez is a 76 y.o. male who I last saw on 04/07/2015. He is undergone previous revascularization in [facility]. He had a left axillobifemoral bypass graft. He also has a left subclavian artery stent. 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 The patient did have an arteriogram in March 2016 showed that the right limb of his graft was anastomosed to the proximal right superficial femoral artery. There was no significant infrainguinal arterial occlusive disease.  When I saw him last he had an ABI on the right of 82% with a monophasic posterior tibial signal and a biphasic dorsalis pedis signal. ABI on the left was 84% with a biphasic posterior tibial and dorsalis pedis signal. He had evidence of chronic venous insufficiency and we discussed the importance of leg elevation. He comes in for a 1 year follow up visit.  Past Medical History:  Diagnosis Date  . Anemia    hx low iron  . Arthritis   . Atrial fibrillation ([place])   . 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 Chronic pain   . Constipation   . COPD (chronic obstructive pulmonary disease) ([place])   . Coronary artery disease   . 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 DVT, lower extremity ([place])    many years  . Dyspnea    with exertion  . Dysrhythmia    AFib  . Family history of adverse reaction to anesthesia    sister has difficulty waking up  . Gout   . Headache    occasional  . History of kidney stones   . History of kidney stones   . History of kidney stones   . Hyperlipidemia   . Hypertension   . Incisional hernia    x 2  . Myocardial infarction    3 stents  . Peripheral artery disease ([place])   . Pneumonia   . Umbilical hernia     Family History  Problem Relation Age of Onset  .  Diabetes Mother   . Cancer Mother     Right Breast  . Heart disease Mother   . Hyperlipidemia Mother   . Hypertension Mother   . Lymphoma Mother     Chemo  . Cancer Father   . Alcohol abuse Sister   . Heart disease Sister   . Hyperlipidemia Sister   . Hypertension Sister   . Stroke Sister   . Cancer Brother   . Heart disease Brother   . Depression Brother   . Early death Brother   . Hyperlipidemia Brother   . Hypertension Brother   . Heart disease Maternal Grandmother   . Kidney disease Maternal Grandmother   . Heart disease Maternal Grandfather   . Kidney disease Maternal Grandfather     SOCIAL HISTORY: Social History  Substance Use Topics  . Smoking status: Former Smoker    Types: Cigarettes    Quit date: 07/22/1998  . Smokeless tobacco: Current User    Types: Snuff  . Alcohol use No    Allergies  Allergen Reactions  . Zocor [Simvastatin] Hives    Current Outpatient Prescriptions  Medication Sig Dispense Refill  . acetaminophen (TYLENOL) 500 MG tablet Take 500-1,000 mg by mouth every 6 (six) hours as needed for moderate pain or  headache.    . albuterol (PROVENTIL HFA;VENTOLIN HFA) 108 (90 BASE) MCG/ACT inhaler Inhale 2 puffs into the lungs every 6 (six) hours as needed for wheezing or shortness of breath. 1 Inhaler 2  . allopurinol (ZYLOPRIM) 100 MG tablet TAKE ONE TABLET BY MOUTH ONCE DAILY IN THE EVENING 90 tablet 2  . allopurinol (ZYLOPRIM) 300 MG tablet TAKE ONE TABLET BY MOUTH ONCE DAILY IN THE MORNING 90 tablet 3  . Ascorbic Acid (VITAMIN C PO) Take 1 tablet by mouth daily.    . Cholecalciferol (VITAMIN D-3 PO) Take 1 tablet by mouth daily.    . enalapril (VASOTEC) 10 MG tablet TAKE ONE TABLET BY MOUTH ONCE DAILY 90 tablet 1  . EPINEPHrine (EPI-PEN) 0.3 mg/0.3 mL SOAJ injection Inject 0.3 mLs (0.3 mg total) into the muscle once. (Patient taking differently: Inject 0.3 mg into the muscle once as needed (For anaphylaxis.). ) 1 Device 1  . furosemide (LASIX) 20  MG tablet TAKE TWO TABLETS BY MOUTH ONCE DAILY 120 tablet 0  . furosemide (LASIX) 40 MG tablet Take 1 tablet (40 mg total) by mouth daily. 30 tablet 4  . lovastatin (MEVACOR) 20 MG tablet TAKE ONE TABLET BY MOUTH AT BEDTIME 90 tablet 2  . metoprolol tartrate (LOPRESSOR) 25 MG tablet TAKE ONE TABLET BY MOUTH TWICE DAILY 60 tablet 3  . metroNIDAZOLE (METROGEL) 1 % gel Apply topically daily. For rosacea (Patient taking differently: Apply 1 application topically daily. For rosacea) 45 g 2  . Multiple Vitamins-Minerals (MENS MULTIVITAMIN PLUS PO) Take 1 tablet by mouth daily.    . nitroGLYCERIN (NITROSTAT) 0.4 MG SL tablet Place 1 tablet (0.4 mg total) under the tongue every 5 (five) minutes as needed for chest pain. 90 tablet 3  . polyethylene glycol powder (GLYCOLAX/MIRALAX) powder DISSOLVE 17G OF POWDER IN 8 OUNCES OF LIQUID AND DRINK 2 TIMES PER DAY AS NEEDED FOR MILD CONSTIPATION. (Patient taking differently: Take 17 g by mouth daily. ) 527 g 11  . potassium chloride SA (KLOR-CON M20) 20 MEQ tablet Take 1 tablet (20 mEq total) by mouth daily. 90 tablet 2  . triamcinolone ointment (KENALOG) 0.5 % Apply 1 application topically 2 (two) times daily as needed. (Patient taking differently: Apply 1 application topically 2 (two) times daily as needed (Apply to affected area.). ) 30 g 2  . warfarin (COUMADIN) 5 MG tablet Take 1 tablet (5 mg total) by mouth daily. Or as directed (Patient taking differently: Take 2.5-5 mg by mouth every evening. Take half a tablet (2.5mg ) on Monday and Friday and one tablet (5mg ) the rest of the week.) 90 tablet 3  . HYDROcodone-acetaminophen (NORCO) 5-325 MG tablet Take 1 tablet by mouth every 6 (six) hours as needed. (Patient not taking: Reported on 03/02/2016) 60 tablet 0  . metoprolol tartrate (LOPRESSOR) 25 MG tablet Take 0.5 tablets (12.5 mg total) by mouth 2 (two) times daily. (Patient not taking: Reported on 03/02/2016) 60 tablet 6   No current facility-administered  medications for this visit.     REVIEW OF SYSTEMS:  [X]  denotes positive finding, [ ]  denotes negative finding Cardiac  Comments:  Chest pain or chest pressure:    Shortness of breath upon exertion:    Short of breath when lying flat:    Irregular heart rhythm: X       Vascular    Pain in calf, thigh, or hip brought on by ambulation:    Pain in feet at night that wakes you up from your  sleep:     Blood clot in your veins:    Leg swelling:  X       Pulmonary    Oxygen at home:    Productive cough:     Wheezing:         Neurologic    Sudden weakness in arms or legs:     Sudden numbness in arms or legs:     Sudden onset of difficulty speaking or slurred speech:    Temporary loss of vision in one eye:     Problems with dizziness:         Gastrointestinal    Blood in stool:     Vomited blood:         Genitourinary    Burning when urinating:     Blood in urine:        Psychiatric    Major depression:         Hematologic    Bleeding problems:    Problems with blood clotting too easily:        Skin    Rashes or ulcers:        Constitutional    Fever or chills:      PHYSICAL EXAM: Vitals:   04/12/16 1411  BP: 137/78  Pulse: 65  Resp: 20  Temp: 97.8 F (36.6 C)  TempSrc: Oral  SpO2: 91%  Weight: 297 lb (134.7 kg)  Height: 6\' 4"  (1.93 m)    GENERAL: The patient is a well-nourished male, in no acute distress. The vital signs are documented above. CARDIAC: There is a regular rate and rhythm.  VASCULAR: I do not detect carotid bruits. I cannot palpate femoral popliteal or pedal pulses. He has mild bilateral lower extremity swelling. PULMONARY: There is good air exchange bilaterally without wheezing or rales. ABDOMEN: Soft and non-tender with normal pitched bowel sounds.  MUSCULOSKELETAL: There are no major deformities or cyanosis. NEUROLOGIC: No focal weakness or paresthesias are detected. SKIN: There are no ulcers or rashes noted. PSYCHIATRIC: The patient  has a normal affect.  DATA:   Arterial duplex: I have inability interpreted his duplex of his axillobifemoral bypass graft. There are some elevated velocities were the femorofemoral graft is anastomosed to the left axillofemoral graft. This graft was done in [facility] in 2013. He is also noted to have elevated velocities in the left subclavian artery were has a stent.  BILATERAL LOWER EXTREMITY ARTERIAL DOPPLER STUDY: I have independently interpreted his bilateral lower arterial Doppler study.  On the right side he has an ABI of 75% with a monophasic posterior tibial signal biphasic dorsalis pedis signal.  On the left side he has an ABI of 87% with a monophasic posterior tibial signal and a biphasic dorsalis pedis signal.  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 There are also elevated velocities where the femorofemoral graft is anastomosed to the axillofemoral graft. Given the risk for graft thrombosis I have recommended arteriography to further assess these areas of concern. Although he is at increased risk because of his age, obesity, and medical comorbidities, I think he is at risk for graft thrombosis. My plan would be to cannulate the axillofemoral graft in a retrograde fashion and potentially address a recurrent left subclavian stenosis if possible. We would then have images of the stenosis where the femorofemoral graft  is anastomosed to the axillofemoral graft. This would likely not be a good lesion for angioplasty but we would have images for possible surgical revision if it was felt that this was indicated. I have discussed the indications for the procedure and the potential complications and he is agreeable to proceed on 04/17/2016. All of his questions were answered.   Deitra Mayo [facility] [phone]

## 2016-04-13 ENCOUNTER — Other Ambulatory Visit: Payer: Self-pay

## 2016-04-13 ENCOUNTER — Telehealth: Payer: Self-pay | Admitting: Family Medicine

## 2016-04-13 NOTE — Telephone Encounter (Signed)
Pt is having [facility]. His last dose of coumadin was 04-12-16.   If pt needs to be bridged, then would it need to be ordered from dr?

## 2016-04-14 MED ORDER — ENOXAPARIN SODIUM 150 MG/ML ~~LOC~~ SOLN: 1.0000 mg/kg | Freq: Two times a day (BID) | SUBCUTANEOUS | 0 refills | Status: DC

## 2016-04-14 NOTE — Telephone Encounter (Signed)
Spoke with patient's sister Garald Braver regarding his upcoming procedure with [facility].  His last dose of coumadin was 2/21.  He has afib with Chadsvasc score of 6, so he should be bridged.  Will start lovenox 1mg /kg BID x3days (stop day before procedure) to bridge.  Resume coumadin after procedure.  Family to alert Dr. Nicole Cella office of lovenox. Will also send Epic message.  Patient will need INR check late next week  (3/2).  [name], MD, MPH PGY-3,  [facility] 04/14/2016 9:21 AM

## 2016-04-17 ENCOUNTER — Encounter (HOSPITAL_COMMUNITY)
Admission: RE | Disposition: A | Discharge status: Home / Self Care | Payer: Self-pay | Source: Ambulatory Visit | Attending: Vascular Surgery

## 2016-04-17 ENCOUNTER — Inpatient Hospital Stay (HOSPITAL_COMMUNITY)
Admission: RE | Admit: 2016-04-17 | Discharge: 2016-04-19 | DRG: 254 | Disposition: A | Discharge status: Home / Self Care | Payer: Medicare Other | Source: Ambulatory Visit | Attending: Vascular Surgery | Admitting: Vascular Surgery

## 2016-04-17 ENCOUNTER — Encounter (HOSPITAL_COMMUNITY): Payer: Self-pay | Admitting: *Deleted

## 2016-04-17 DIAGNOSIS — Z720 Tobacco use: Secondary | ICD-10-CM

## 2016-04-17 DIAGNOSIS — Z6835 Body mass index (BMI) 35.0-35.9, adult: Secondary | ICD-10-CM

## 2016-04-17 DIAGNOSIS — I2510 Atherosclerotic heart disease of native coronary artery without angina pectoris: Secondary | ICD-10-CM | POA: Diagnosis present

## 2016-04-17 DIAGNOSIS — Z833 Family history of diabetes mellitus: Secondary | ICD-10-CM

## 2016-04-17 DIAGNOSIS — E785 Hyperlipidemia, unspecified: Secondary | ICD-10-CM | POA: Diagnosis present

## 2016-04-17 DIAGNOSIS — M109 Gout, unspecified: Secondary | ICD-10-CM | POA: Diagnosis present

## 2016-04-17 DIAGNOSIS — I739 Peripheral vascular disease, unspecified: Secondary | ICD-10-CM | POA: Diagnosis present

## 2016-04-17 DIAGNOSIS — Z85828 Personal history of other malignant neoplasm of skin: Secondary | ICD-10-CM

## 2016-04-17 DIAGNOSIS — Z89411 Acquired absence of right great toe: Secondary | ICD-10-CM | POA: Diagnosis not present

## 2016-04-17 DIAGNOSIS — Z888 Allergy status to other drugs, medicaments and biological substances status: Secondary | ICD-10-CM | POA: Diagnosis not present

## 2016-04-17 DIAGNOSIS — E11610 Type 2 diabetes mellitus with diabetic neuropathic arthropathy: Secondary | ICD-10-CM | POA: Diagnosis not present

## 2016-04-17 DIAGNOSIS — Z7901 Long term (current) use of anticoagulants: Secondary | ICD-10-CM

## 2016-04-17 DIAGNOSIS — Z955 Presence of coronary angioplasty implant and graft: Secondary | ICD-10-CM | POA: Diagnosis not present

## 2016-04-17 DIAGNOSIS — I252 Old myocardial infarction: Secondary | ICD-10-CM | POA: Diagnosis not present

## 2016-04-17 DIAGNOSIS — J449 Chronic obstructive pulmonary disease, unspecified: Secondary | ICD-10-CM | POA: Diagnosis present

## 2016-04-17 DIAGNOSIS — T82398A Other mechanical complication of other vascular grafts, initial encounter: Secondary | ICD-10-CM | POA: Diagnosis not present

## 2016-04-17 DIAGNOSIS — I708 Atherosclerosis of other arteries: Secondary | ICD-10-CM | POA: Diagnosis present

## 2016-04-17 DIAGNOSIS — I4891 Unspecified atrial fibrillation: Secondary | ICD-10-CM | POA: Diagnosis present

## 2016-04-17 DIAGNOSIS — G8929 Other chronic pain: Secondary | ICD-10-CM | POA: Diagnosis present

## 2016-04-17 DIAGNOSIS — I10 Essential (primary) hypertension: Secondary | ICD-10-CM | POA: Diagnosis present

## 2016-04-17 DIAGNOSIS — Z86718 Personal history of other venous thrombosis and embolism: Secondary | ICD-10-CM | POA: Diagnosis not present

## 2016-04-17 DIAGNOSIS — Z79899 Other long term (current) drug therapy: Secondary | ICD-10-CM | POA: Diagnosis not present

## 2016-04-17 DIAGNOSIS — Y838 Other surgical procedures as the cause of abnormal reaction of the patient, or of later complication, without mention of misadventure at the time of the procedure: Secondary | ICD-10-CM | POA: Diagnosis present

## 2016-04-17 DIAGNOSIS — I872 Venous insufficiency (chronic) (peripheral): Secondary | ICD-10-CM | POA: Diagnosis present

## 2016-04-17 DIAGNOSIS — E1151 Type 2 diabetes mellitus with diabetic peripheral angiopathy without gangrene: Secondary | ICD-10-CM | POA: Diagnosis present

## 2016-04-17 DIAGNOSIS — Z8249 Family history of ischemic heart disease and other diseases of the circulatory system: Secondary | ICD-10-CM | POA: Diagnosis not present

## 2016-04-17 DIAGNOSIS — E669 Obesity, unspecified: Secondary | ICD-10-CM | POA: Diagnosis present

## 2016-04-17 DIAGNOSIS — T82898A Other specified complication of vascular prosthetic devices, implants and grafts, initial encounter: Secondary | ICD-10-CM | POA: Diagnosis not present

## 2016-04-17 HISTORY — PX: UPPER EXTREMITY ANGIOGRAPHY: CATH118270

## 2016-04-17 HISTORY — DX: Personal history of other medical treatment: Z92.89

## 2016-04-17 HISTORY — PX: LOWER EXTREMITY ANGIOGRAPHY: CATH118251

## 2016-04-17 HISTORY — DX: Reserved for concepts with insufficient information to code with codable children: IMO0002

## 2016-04-17 HISTORY — PX: AORTIC ARCH ANGIOGRAPHY: CATH118224

## 2016-04-17 HISTORY — DX: Basal cell carcinoma of skin, unspecified: C44.91

## 2016-04-17 HISTORY — DX: Charcot's joint, unspecified site: M14.60

## 2016-04-17 HISTORY — DX: Stricture of artery: I77.1

## 2016-04-17 LAB — CBC
HEMATOCRIT: 36.1 % — AB (ref 39.0–52.0)
HEMOGLOBIN: 11.4 g/dL — AB (ref 13.0–17.0)
MCH: 30.4 pg (ref 26.0–34.0)
MCHC: 31.6 g/dL (ref 30.0–36.0)
MCV: 96.3 fL (ref 78.0–100.0)
Platelets: 120 10*3/uL — ABNORMAL LOW (ref 150–400)
RBC: 3.75 MIL/uL — AB (ref 4.22–5.81)
RDW: 15.3 % (ref 11.5–15.5)
WBC: 4.9 10*3/uL (ref 4.0–10.5)

## 2016-04-17 LAB — POCT I-STAT, CHEM 8
BUN: 33 mg/dL — AB (ref 6–20)
CALCIUM ION: 1.17 mmol/L (ref 1.15–1.40)
CHLORIDE: 103 mmol/L (ref 101–111)
CREATININE: 1.6 mg/dL — AB (ref 0.61–1.24)
Glucose, Bld: 96 mg/dL (ref 65–99)
HEMATOCRIT: 36 % — AB (ref 39.0–52.0)
Hemoglobin: 12.2 g/dL — ABNORMAL LOW (ref 13.0–17.0)
Potassium: 4.4 mmol/L (ref 3.5–5.1)
SODIUM: 141 mmol/L (ref 135–145)
TCO2: 28 mmol/L (ref 0–100)

## 2016-04-17 LAB — CREATININE, SERUM
Creatinine, Ser: 1.39 mg/dL — ABNORMAL HIGH (ref 0.61–1.24)
GFR, EST AFRICAN AMERICAN: 56 mL/min — AB (ref >60)
GFR, EST NON AFRICAN AMERICAN: 48 mL/min — AB (ref >60)

## 2016-04-17 LAB — PROTIME-INR
INR: 1.68
Prothrombin Time: 20 seconds — ABNORMAL HIGH (ref 11.4–15.2)

## 2016-04-17 SURGERY — AORTIC ARCH ANGIOGRAPHY

## 2016-04-17 MED ORDER — IODIXANOL 320 MG/ML IV SOLN
INTRAVENOUS | Status: DC | PRN
  Administered 2016-04-17: 145 mL via INTRAVENOUS

## 2016-04-17 MED ORDER — ALBUTEROL SULFATE (2.5 MG/3ML) 0.083% IN NEBU: 3.0000 mL | INHALATION_SOLUTION | Freq: Four times a day (QID) | RESPIRATORY_TRACT | Status: DC | PRN

## 2016-04-17 MED ORDER — METOPROLOL TARTRATE 12.5 MG HALF TABLET
12.5000 mg | ORAL_TABLET | Freq: Two times a day (BID) | ORAL | Status: DC
  Administered 2016-04-17 – 2016-04-18 (×2): 12.5 mg via ORAL
  Filled 2016-04-17 (×3): qty 1

## 2016-04-17 MED ORDER — HEPARIN (PORCINE) IN NACL 2-0.9 UNIT/ML-% IJ SOLN
INTRAMUSCULAR | Status: AC
  Filled 2016-04-17: qty 1000

## 2016-04-17 MED ORDER — LIDOCAINE HCL (PF) 1 % IJ SOLN
INTRAMUSCULAR | Status: DC | PRN
  Administered 2016-04-17: 10 mL

## 2016-04-17 MED ORDER — ALLOPURINOL 300 MG PO TABS
300.0000 mg | ORAL_TABLET | Freq: Every day | ORAL | Status: DC
  Administered 2016-04-17: 300 mg via ORAL
  Filled 2016-04-17 (×2): qty 1

## 2016-04-17 MED ORDER — LIDOCAINE HCL (PF) 1 % IJ SOLN
INTRAMUSCULAR | Status: AC
  Filled 2016-04-17: qty 30

## 2016-04-17 MED ORDER — ACETAMINOPHEN 325 MG PO TABS: 650.0000 mg | ORAL_TABLET | ORAL | Status: DC | PRN

## 2016-04-17 MED ORDER — VITAMIN D 1000 UNITS PO TABS
ORAL_TABLET | Freq: Every day | ORAL | Status: DC
  Administered 2016-04-17: 1000 [IU] via ORAL
  Filled 2016-04-17: qty 1

## 2016-04-17 MED ORDER — VITAMIN C 500 MG/5ML PO SYRP
500.0000 mg | ORAL_SOLUTION | Freq: Every day | ORAL | Status: DC
  Filled 2016-04-17 (×3): qty 5

## 2016-04-17 MED ORDER — MIDAZOLAM HCL 2 MG/2ML IJ SOLN
INTRAMUSCULAR | Status: AC
  Filled 2016-04-17: qty 2

## 2016-04-17 MED ORDER — MIDAZOLAM HCL 2 MG/2ML IJ SOLN
INTRAMUSCULAR | Status: DC | PRN
  Administered 2016-04-17: 1 mg via INTRAVENOUS

## 2016-04-17 MED ORDER — FENTANYL CITRATE (PF) 100 MCG/2ML IJ SOLN
INTRAMUSCULAR | Status: AC
  Filled 2016-04-17: qty 2

## 2016-04-17 MED ORDER — ENOXAPARIN SODIUM 150 MG/ML ~~LOC~~ SOLN: 1.0000 mg/kg | Freq: Two times a day (BID) | SUBCUTANEOUS | Status: DC

## 2016-04-17 MED ORDER — FENTANYL CITRATE (PF) 100 MCG/2ML IJ SOLN
INTRAMUSCULAR | Status: DC | PRN
  Administered 2016-04-17: 50 ug via INTRAVENOUS

## 2016-04-17 MED ORDER — HEPARIN (PORCINE) IN NACL 2-0.9 UNIT/ML-% IJ SOLN
INTRAMUSCULAR | Status: DC | PRN
  Administered 2016-04-17: 1000 mL

## 2016-04-17 MED ORDER — POTASSIUM CHLORIDE CRYS ER 20 MEQ PO TBCR
20.0000 meq | EXTENDED_RELEASE_TABLET | Freq: Every day | ORAL | Status: DC
  Administered 2016-04-17: 20 meq via ORAL
  Filled 2016-04-17 (×2): qty 1

## 2016-04-17 MED ORDER — ACETAMINOPHEN 500 MG PO TABS: 500.0000 mg | ORAL_TABLET | Freq: Four times a day (QID) | ORAL | Status: DC | PRN

## 2016-04-17 MED ORDER — LOVASTATIN 20 MG PO TABS: 20.0000 mg | ORAL_TABLET | Freq: Every day | ORAL | Status: DC

## 2016-04-17 MED ORDER — SODIUM CHLORIDE 0.9 % IV SOLN: 1.0000 mL/kg/h | INTRAVENOUS | Status: AC

## 2016-04-17 MED ORDER — NITROGLYCERIN 0.4 MG SL SUBL: 0.4000 mg | SUBLINGUAL_TABLET | SUBLINGUAL | Status: DC | PRN

## 2016-04-17 MED ORDER — CARBOXYMETHYLCELLUL-GLYCERIN 0.5-0.9 % OP SOLN
Freq: Every day | OPHTHALMIC | Status: DC | PRN
Start: 2016-04-17 — End: 2016-04-17

## 2016-04-17 MED ORDER — CEFAZOLIN SODIUM 10 G IJ SOLR
3.0000 g | INTRAMUSCULAR | Status: AC
  Administered 2016-04-18: 3 g via INTRAVENOUS
  Filled 2016-04-17 (×2): qty 3000

## 2016-04-17 MED ORDER — LOVASTATIN 20 MG PO TABS
20.0000 mg | ORAL_TABLET | Freq: Every day | ORAL | Status: DC
  Administered 2016-04-17: 20 mg via ORAL
  Filled 2016-04-17 (×3): qty 1

## 2016-04-17 MED ORDER — ONDANSETRON HCL 4 MG/2ML IJ SOLN
4.0000 mg | Freq: Four times a day (QID) | INTRAMUSCULAR | Status: DC | PRN
  Administered 2016-04-18: 4 mg via INTRAVENOUS

## 2016-04-17 MED ORDER — ENOXAPARIN SODIUM 40 MG/0.4ML ~~LOC~~ SOLN
40.0000 mg | SUBCUTANEOUS | Status: DC
  Administered 2016-04-17: 40 mg via SUBCUTANEOUS
  Filled 2016-04-17: qty 0.4

## 2016-04-17 MED ORDER — FUROSEMIDE 40 MG PO TABS
40.0000 mg | ORAL_TABLET | Freq: Every day | ORAL | Status: DC
  Administered 2016-04-17 – 2016-04-18 (×2): 40 mg via ORAL
  Filled 2016-04-17 (×2): qty 1

## 2016-04-17 MED ORDER — ENALAPRIL MALEATE 10 MG PO TABS
10.0000 mg | ORAL_TABLET | Freq: Every day | ORAL | Status: DC
  Administered 2016-04-17: 10 mg via ORAL
  Filled 2016-04-17 (×2): qty 1

## 2016-04-17 MED ORDER — SODIUM CHLORIDE 0.9 % IV SOLN
INTRAVENOUS | Status: DC
  Administered 2016-04-17: 06:00:00 via INTRAVENOUS

## 2016-04-17 MED ORDER — PRAVASTATIN SODIUM 20 MG PO TABS: 20.0000 mg | ORAL_TABLET | Freq: Every day | ORAL | Status: DC

## 2016-04-17 SURGICAL SUPPLY — 13 items
CATH ANGIO 5F PIGTAIL 65CM (CATHETERS) ×5 IMPLANT
CATH STRAIGHT 5FR 65CM (CATHETERS) ×5 IMPLANT
COVER PRB 48X5XTLSCP FOLD TPE (BAG) ×3 IMPLANT
COVER PROBE 5X48 (BAG) ×2
KIT PV (KITS) ×5 IMPLANT
SHEATH PINNACLE 5F 10CM (SHEATH) ×5 IMPLANT
STOPCOCK MORSE 400PSI 3WAY (MISCELLANEOUS) ×5 IMPLANT
SYR MEDRAD MARK V 150ML (SYRINGE) ×5 IMPLANT
TRANSDUCER W/STOPCOCK (MISCELLANEOUS) ×5 IMPLANT
TRAY PV CATH (CUSTOM PROCEDURE TRAY) ×5 IMPLANT
TUBING HIGH PRESSURE 120CM (CONNECTOR) ×5 IMPLANT
WIRE HITORQ VERSACORE ST 145CM (WIRE) ×5 IMPLANT
WIRE MINI STICK MAX (SHEATH) ×5 IMPLANT

## 2016-04-17 NOTE — Op Note (Signed)
PATIENT: Terry Martinez   MRN: [id number] DOB: 24-Mar-1940    DATE OF PROCEDURE: 04/17/2016  INDICATIONS: Terry Martinez is a 76 y.o. male who underwent a left axillobifemoral bypass graft in [place]. He presented to the office for a routine follow up and was noted to have elevated velocities in the left subclavian artery where he had a previous subclavian stent. There were also some elevated velocities at the proximal anastomosis of the graft to the axillary artery. There were more significantly elevated velocities with a femorofemoral graft was anastomosed to the axillofemoral graft on the left. Given the risk for graft thrombosis he presents for arteriography.  PROCEDURE:  1. Ultrasound-guided access to the left axillofemoral bypass graft 2. Arch aortogram with left subclavian arteriogram 3. Bilateral lower extremity runoff  SURGEON: Judeth Cornfield. Scot Dock, MD, FACS  ANESTHESIA: Local with sedation   EBL: Minimal  TECHNIQUE: The patient was taken to the peripheral vascular lab and was sedated. The period of conscious sedation was 42 minutes.  During that time period, I was present face-to-face 100% of the time.  The patient was administered (1 mg of Versed and 50 g of fentanyl.). The patient's heart rate, blood pressure, and oxygen saturation were monitored by the nurse continuously during the procedure.  Under ultrasound guidance, after the skin was anesthetized, the left axillofemoral bypass graft was cannulated in the mid abdomen with a micropuncture needle. A micropuncture sheath was introduced over a wire. This was exchanged for a 5 [place] sheath over a [employer] wire. A pigtail catheter was advanced over the wire into the aortic arch and then pulled back to engage the subclavian artery. Arch aortogram was obtained. I then exchanged the pigtail catheter for a straight catheter which was positioned in the left subclavian artery. Selective left subclavian arteriogram was obtained  and the runoff of the entire bypass graft was obtained. Next this catheter was removed and the remaining films shot through that sheath. Bilateral lower extremity runoff films were obtained. At the completion of the procedure the sheath was removed and pressure held for hemostasis. No immediate competitions were noted.  FINDINGS:  1. The left subclavian artery stent is widely patent. I measured a pressure gradient across the stent and there was no pressure gradient at rest. 2. There is only very minimal narrowing of the proximal axillofemoral graft at the anastomosis proximally. 3. The axillofemoral bypass graft is widely patent. 4. There is a slight kink and some narrowing where the left to right femorofemoral bypass graft is anastomosed to the left axillofemoral bypass graft. This is the most significant narrowing noted. 5. On the right side the common femoral, deep femoral, superficial femoral, popliteal, anterior tibial, posterior tibial, and peroneal arteries are patent. 6. On the left side, the common femoral, deep femoral, superficial femoral, popliteal, anterior tibial, posterior tibial, and peroneal arteries are patent.  CLINICAL NOTE: The most significant stenosis is the narrowing where the left right axillofemoral bypass graft is kinked and narrowed adjacent to the anastomosis to the axillofemoral graft. He has had a drop in his ABI on the right which would be consistent with this finding. I have recommended surgical revision of this. Given that he is off his Coumadin for his arteriogram we will keep him in the hospital today and proceed with surgery tomorrow. I have discussed the procedure and potential complications and he is agreeable to proceed.  Deitra Mayo, MD, FACS [facility]  DATE OF DICTATION:   04/17/2016

## 2016-04-17 NOTE — Progress Notes (Signed)
Patient arrived to 2W room 15.  Telemetry monitor applied and CCMD notified.  Patient oriented to room and unit to include phone and call light.  Will continue to monitor.

## 2016-04-17 NOTE — Interval H&P Note (Signed)
History and Physical Interval Note:  04/17/2016 8:10 AM  Terry Martinez  has presented today for surgery, with the diagnosis of left subclavian artery stenosis  The various methods of treatment have been discussed with the patient and family. After consideration of risks, benefits and other options for treatment, the patient has consented to  Procedure(s): Upper Extremity Angiography (Left) as a surgical intervention .  The patient's history has been reviewed, patient examined, no change in status, stable for surgery.  I have reviewed the patient's chart and labs.  Questions were answered to the patient's satisfaction.     Deitra Mayo

## 2016-04-17 NOTE — H&P (View-Only) (Signed)
Patient name: Terry Martinez MRN: [id number] DOB: [birth year] Sex: male  REASON FOR VISIT: Follow up of peripheral vascular disease.  HPI: Terry Martinez is a 76 y.o. male who I last saw on 04/07/2015. He is undergone previous revascularization in [facility]. He had a left axillobifemoral bypass graft. He also has a left subclavian artery stent. 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 The patient did have an arteriogram in March 2016 showed that the right limb of his graft was anastomosed to the proximal right superficial femoral artery. There was no significant infrainguinal arterial occlusive disease.  When I saw him last he had an ABI on the right of 82% with a monophasic posterior tibial signal and a biphasic dorsalis pedis signal. ABI on the left was 84% with a biphasic posterior tibial and dorsalis pedis signal. He had evidence of chronic venous insufficiency and we discussed the importance of leg elevation. He comes in for a 1 year follow up visit.  Past Medical History:  Diagnosis Date  . Anemia    hx low iron  . Arthritis   . Atrial fibrillation ([place])   . 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 Chronic pain   . Constipation   . COPD (chronic obstructive pulmonary disease) ([place])   . Coronary artery disease   . 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 DVT, lower extremity ([place])    many years  . Dyspnea    with exertion  . Dysrhythmia    AFib  . Family history of adverse reaction to anesthesia    sister has difficulty waking up  . Gout   . Headache    occasional  . History of kidney stones   . History of kidney stones   . History of kidney stones   . Hyperlipidemia   . Hypertension   . Incisional hernia    x 2  . Myocardial infarction    3 stents  . Peripheral artery disease ([place])   . Pneumonia   . Umbilical hernia     Family History  Problem Relation Age of Onset  .  Diabetes Mother   . Cancer Mother     Right Breast  . Heart disease Mother   . Hyperlipidemia Mother   . Hypertension Mother   . Lymphoma Mother     Chemo  . Cancer Father   . Alcohol abuse Sister   . Heart disease Sister   . Hyperlipidemia Sister   . Hypertension Sister   . Stroke Sister   . Cancer Brother   . Heart disease Brother   . Depression Brother   . Early death Brother   . Hyperlipidemia Brother   . Hypertension Brother   . Heart disease Maternal Grandmother   . Kidney disease Maternal Grandmother   . Heart disease Maternal Grandfather   . Kidney disease Maternal Grandfather     SOCIAL HISTORY: Social History  Substance Use Topics  . Smoking status: Former Smoker    Types: Cigarettes    Quit date: 07/22/1998  . Smokeless tobacco: Current User    Types: Snuff  . Alcohol use No    Allergies  Allergen Reactions  . Zocor [Simvastatin] Hives    Current Outpatient Prescriptions  Medication Sig Dispense Refill  . acetaminophen (TYLENOL) 500 MG tablet Take 500-1,000 mg by mouth every 6 (six) hours as needed for moderate pain or  headache.    . albuterol (PROVENTIL HFA;VENTOLIN HFA) 108 (90 BASE) MCG/ACT inhaler Inhale 2 puffs into the lungs every 6 (six) hours as needed for wheezing or shortness of breath. 1 Inhaler 2  . allopurinol (ZYLOPRIM) 100 MG tablet TAKE ONE TABLET BY MOUTH ONCE DAILY IN THE EVENING 90 tablet 2  . allopurinol (ZYLOPRIM) 300 MG tablet TAKE ONE TABLET BY MOUTH ONCE DAILY IN THE MORNING 90 tablet 3  . Ascorbic Acid (VITAMIN C PO) Take 1 tablet by mouth daily.    . Cholecalciferol (VITAMIN D-3 PO) Take 1 tablet by mouth daily.    . enalapril (VASOTEC) 10 MG tablet TAKE ONE TABLET BY MOUTH ONCE DAILY 90 tablet 1  . EPINEPHrine (EPI-PEN) 0.3 mg/0.3 mL SOAJ injection Inject 0.3 mLs (0.3 mg total) into the muscle once. (Patient taking differently: Inject 0.3 mg into the muscle once as needed (For anaphylaxis.). ) 1 Device 1  . furosemide (LASIX) 20  MG tablet TAKE TWO TABLETS BY MOUTH ONCE DAILY 120 tablet 0  . furosemide (LASIX) 40 MG tablet Take 1 tablet (40 mg total) by mouth daily. 30 tablet 4  . lovastatin (MEVACOR) 20 MG tablet TAKE ONE TABLET BY MOUTH AT BEDTIME 90 tablet 2  . metoprolol tartrate (LOPRESSOR) 25 MG tablet TAKE ONE TABLET BY MOUTH TWICE DAILY 60 tablet 3  . metroNIDAZOLE (METROGEL) 1 % gel Apply topically daily. For rosacea (Patient taking differently: Apply 1 application topically daily. For rosacea) 45 g 2  . Multiple Vitamins-Minerals (MENS MULTIVITAMIN PLUS PO) Take 1 tablet by mouth daily.    . nitroGLYCERIN (NITROSTAT) 0.4 MG SL tablet Place 1 tablet (0.4 mg total) under the tongue every 5 (five) minutes as needed for chest pain. 90 tablet 3  . polyethylene glycol powder (GLYCOLAX/MIRALAX) powder DISSOLVE 17G OF POWDER IN 8 OUNCES OF LIQUID AND DRINK 2 TIMES PER DAY AS NEEDED FOR MILD CONSTIPATION. (Patient taking differently: Take 17 g by mouth daily. ) 527 g 11  . potassium chloride SA (KLOR-CON M20) 20 MEQ tablet Take 1 tablet (20 mEq total) by mouth daily. 90 tablet 2  . triamcinolone ointment (KENALOG) 0.5 % Apply 1 application topically 2 (two) times daily as needed. (Patient taking differently: Apply 1 application topically 2 (two) times daily as needed (Apply to affected area.). ) 30 g 2  . warfarin (COUMADIN) 5 MG tablet Take 1 tablet (5 mg total) by mouth daily. Or as directed (Patient taking differently: Take 2.5-5 mg by mouth every evening. Take half a tablet (2.5mg ) on Monday and Friday and one tablet (5mg ) the rest of the week.) 90 tablet 3  . HYDROcodone-acetaminophen (NORCO) 5-325 MG tablet Take 1 tablet by mouth every 6 (six) hours as needed. (Patient not taking: Reported on 03/02/2016) 60 tablet 0  . metoprolol tartrate (LOPRESSOR) 25 MG tablet Take 0.5 tablets (12.5 mg total) by mouth 2 (two) times daily. (Patient not taking: Reported on 03/02/2016) 60 tablet 6   No current facility-administered  medications for this visit.     REVIEW OF SYSTEMS:  [X]  denotes positive finding, [ ]  denotes negative finding Cardiac  Comments:  Chest pain or chest pressure:    Shortness of breath upon exertion:    Short of breath when lying flat:    Irregular heart rhythm: X       Vascular    Pain in calf, thigh, or hip brought on by ambulation:    Pain in feet at night that wakes you up from your  sleep:     Blood clot in your veins:    Leg swelling:  X       Pulmonary    Oxygen at home:    Productive cough:     Wheezing:         Neurologic    Sudden weakness in arms or legs:     Sudden numbness in arms or legs:     Sudden onset of difficulty speaking or slurred speech:    Temporary loss of vision in one eye:     Problems with dizziness:         Gastrointestinal    Blood in stool:     Vomited blood:         Genitourinary    Burning when urinating:     Blood in urine:        Psychiatric    Major depression:         Hematologic    Bleeding problems:    Problems with blood clotting too easily:        Skin    Rashes or ulcers:        Constitutional    Fever or chills:      PHYSICAL EXAM: Vitals:   04/12/16 1411  BP: 137/78  Pulse: 65  Resp: 20  Temp: 97.8 F (36.6 C)  TempSrc: Oral  SpO2: 91%  Weight: 297 lb (134.7 kg)  Height: 6\' 4"  (1.93 m)    GENERAL: The patient is a well-nourished male, in no acute distress. The vital signs are documented above. CARDIAC: There is a regular rate and rhythm.  VASCULAR: I do not detect carotid bruits. I cannot palpate femoral popliteal or pedal pulses. He has mild bilateral lower extremity swelling. PULMONARY: There is good air exchange bilaterally without wheezing or rales. ABDOMEN: Soft and non-tender with normal pitched bowel sounds.  MUSCULOSKELETAL: There are no major deformities or cyanosis. NEUROLOGIC: No focal weakness or paresthesias are detected. SKIN: There are no ulcers or rashes noted. PSYCHIATRIC: The patient  has a normal affect.  DATA:   Arterial duplex: I have inability interpreted his duplex of his axillobifemoral bypass graft. There are some elevated velocities were the femorofemoral graft is anastomosed to the left axillofemoral graft. This graft was done in [facility] in 2013. He is also noted to have elevated velocities in the left subclavian artery were has a stent.  BILATERAL LOWER EXTREMITY ARTERIAL DOPPLER STUDY: I have independently interpreted his bilateral lower arterial Doppler study.  On the right side he has an ABI of 75% with a monophasic posterior tibial signal biphasic dorsalis pedis signal.  On the left side he has an ABI of 87% with a monophasic posterior tibial signal and a biphasic dorsalis pedis signal.  MEDICAL ISSUES:  STATUS POST LEFT AXILLOBIFEMORAL BYPASS GRAFT IN [place]: There is evidence of a significant stenosis in the subclavian artery on the left where the patient has reportedly had previous stenting. There are also elevated velocities where the femorofemoral graft is anastomosed to the axillofemoral graft. Given the risk for graft thrombosis I have recommended arteriography to further assess these areas of concern. Although he is at increased risk because of his age, obesity, and medical comorbidities, I think he is at risk for graft thrombosis. My plan would be to cannulate the axillofemoral graft in a retrograde fashion and potentially address a recurrent left subclavian stenosis if possible. We would then have images of the stenosis where the femorofemoral graft  is anastomosed to the axillofemoral graft. This would likely not be a good lesion for angioplasty but we would have images for possible surgical revision if it was felt that this was indicated. I have discussed the indications for the procedure and the potential complications and he is agreeable to proceed on 04/17/2016. All of his questions were answered.   Deitra Mayo [facility] [phone]

## 2016-04-18 ENCOUNTER — Inpatient Hospital Stay (HOSPITAL_COMMUNITY): Payer: Medicare Other | Admitting: Certified Registered Nurse Anesthetist

## 2016-04-18 ENCOUNTER — Encounter (HOSPITAL_COMMUNITY): Payer: Self-pay | Admitting: Certified Registered Nurse Anesthetist

## 2016-04-18 ENCOUNTER — Encounter (HOSPITAL_COMMUNITY)
Admission: RE | Disposition: A | Discharge status: Home / Self Care | Payer: Self-pay | Source: Ambulatory Visit | Attending: Vascular Surgery

## 2016-04-18 HISTORY — PX: REVISION OF AORTA BIFEMORAL BYPASS: SHX6317

## 2016-04-18 LAB — APTT: aPTT: 37 seconds — ABNORMAL HIGH (ref 24–36)

## 2016-04-18 SURGERY — REVISION OF AORTA BIFEMORAL BYPASS
Anesthesia: General | Laterality: Bilateral

## 2016-04-18 MED ORDER — PROPOFOL 10 MG/ML IV BOLUS
INTRAVENOUS | Status: AC
  Filled 2016-04-18: qty 20

## 2016-04-18 MED ORDER — FUROSEMIDE 10 MG/ML IJ SOLN
INTRAMUSCULAR | Status: AC
  Filled 2016-04-18: qty 4

## 2016-04-18 MED ORDER — LACTATED RINGERS IV SOLN
INTRAVENOUS | Status: DC
  Administered 2016-04-18: 09:00:00 via INTRAVENOUS

## 2016-04-18 MED ORDER — POLYETHYLENE GLYCOL 3350 17 G PO PACK
17.0000 g | PACK | Freq: Every day | ORAL | Status: DC | PRN
Start: 2016-04-18 — End: 2016-04-19

## 2016-04-18 MED ORDER — MAGNESIUM SULFATE 2 GM/50ML IV SOLN: 2.0000 g | Freq: Every day | INTRAVENOUS | Status: DC | PRN

## 2016-04-18 MED ORDER — MEPERIDINE HCL 25 MG/ML IJ SOLN: 6.2500 mg | INTRAMUSCULAR | Status: DC | PRN

## 2016-04-18 MED ORDER — ROCURONIUM BROMIDE 100 MG/10ML IV SOLN
INTRAVENOUS | Status: DC | PRN
  Administered 2016-04-18: 50 mg via INTRAVENOUS
  Administered 2016-04-18 (×2): 20 mg via INTRAVENOUS

## 2016-04-18 MED ORDER — EPHEDRINE SULFATE 50 MG/ML IJ SOLN
INTRAMUSCULAR | Status: DC | PRN
  Administered 2016-04-18 (×2): 5 mg via INTRAVENOUS

## 2016-04-18 MED ORDER — PROTAMINE SULFATE 10 MG/ML IV SOLN
INTRAVENOUS | Status: DC | PRN
  Administered 2016-04-18: 50 mg via INTRAVENOUS

## 2016-04-18 MED ORDER — BISACODYL 10 MG RE SUPP: 10.0000 mg | Freq: Every day | RECTAL | Status: DC | PRN

## 2016-04-18 MED ORDER — ACETAMINOPHEN 325 MG PO TABS
325.0000 mg | ORAL_TABLET | ORAL | Status: DC | PRN
Start: 2016-04-18 — End: 2016-04-19

## 2016-04-18 MED ORDER — HYDRALAZINE HCL 20 MG/ML IJ SOLN
5.0000 mg | INTRAMUSCULAR | Status: DC | PRN
  Filled 2016-04-18: qty 0.25

## 2016-04-18 MED ORDER — FENTANYL CITRATE (PF) 100 MCG/2ML IJ SOLN
INTRAMUSCULAR | Status: DC | PRN
  Administered 2016-04-18 (×2): 100 ug via INTRAVENOUS

## 2016-04-18 MED ORDER — PHENOL 1.4 % MT LIQD: 1.0000 | OROMUCOSAL | Status: DC | PRN

## 2016-04-18 MED ORDER — PROPOFOL 10 MG/ML IV BOLUS
INTRAVENOUS | Status: DC | PRN
  Administered 2016-04-18: 100 mg via INTRAVENOUS

## 2016-04-18 MED ORDER — OXYCODONE-ACETAMINOPHEN 5-325 MG PO TABS: 1.0000 | ORAL_TABLET | ORAL | Status: DC | PRN

## 2016-04-18 MED ORDER — SUGAMMADEX SODIUM 500 MG/5ML IV SOLN
INTRAVENOUS | Status: DC | PRN
  Administered 2016-04-18: 250 mg via INTRAVENOUS

## 2016-04-18 MED ORDER — SODIUM CHLORIDE 0.9 % IV SOLN
INTRAVENOUS | Status: DC
  Administered 2016-04-19: 05:00:00 via INTRAVENOUS

## 2016-04-18 MED ORDER — FENTANYL CITRATE (PF) 100 MCG/2ML IJ SOLN: 25.0000 ug | INTRAMUSCULAR | Status: DC | PRN

## 2016-04-18 MED ORDER — DEXTROSE 5 % IV SOLN
1.5000 g | Freq: Two times a day (BID) | INTRAVENOUS | Status: AC
  Administered 2016-04-18 – 2016-04-19 (×2): 1.5 g via INTRAVENOUS
  Filled 2016-04-18 (×2): qty 1.5

## 2016-04-18 MED ORDER — LABETALOL HCL 5 MG/ML IV SOLN
10.0000 mg | INTRAVENOUS | Status: DC | PRN
  Filled 2016-04-18: qty 4

## 2016-04-18 MED ORDER — MORPHINE SULFATE (PF) 2 MG/ML IV SOLN: 2.0000 mg | INTRAVENOUS | Status: DC | PRN

## 2016-04-18 MED ORDER — SODIUM CHLORIDE 0.9 % IV SOLN: 500.0000 mL | Freq: Once | INTRAVENOUS | Status: DC | PRN

## 2016-04-18 MED ORDER — METOPROLOL TARTRATE 5 MG/5ML IV SOLN: 2.0000 mg | INTRAVENOUS | Status: DC | PRN

## 2016-04-18 MED ORDER — ACETAMINOPHEN 650 MG RE SUPP: 325.0000 mg | RECTAL | Status: DC | PRN

## 2016-04-18 MED ORDER — GUAIFENESIN-DM 100-10 MG/5ML PO SYRP: 15.0000 mL | ORAL_SOLUTION | ORAL | Status: DC | PRN

## 2016-04-18 MED ORDER — HEPARIN SODIUM (PORCINE) 1000 UNIT/ML IJ SOLN
INTRAMUSCULAR | Status: DC | PRN
  Administered 2016-04-18: 10000 [IU] via INTRAVENOUS

## 2016-04-18 MED ORDER — PANTOPRAZOLE SODIUM 40 MG PO TBEC
40.0000 mg | DELAYED_RELEASE_TABLET | Freq: Every day | ORAL | Status: DC
  Administered 2016-04-18: 40 mg via ORAL
  Filled 2016-04-18: qty 1

## 2016-04-18 MED ORDER — HEPARIN (PORCINE) IN NACL 100-0.45 UNIT/ML-% IJ SOLN
600.0000 [IU]/h | INTRAMUSCULAR | Status: DC
  Administered 2016-04-18: 600 [IU]/h via INTRAVENOUS

## 2016-04-18 MED ORDER — 0.9 % SODIUM CHLORIDE (POUR BTL) OPTIME
TOPICAL | Status: DC | PRN
  Administered 2016-04-18: 2000 mL

## 2016-04-18 MED ORDER — PHENYLEPHRINE HCL 10 MG/ML IJ SOLN
INTRAMUSCULAR | Status: DC | PRN
  Administered 2016-04-18: 120 ug via INTRAVENOUS
  Administered 2016-04-18 (×2): 80 ug via INTRAVENOUS
  Administered 2016-04-18: 120 ug via INTRAVENOUS

## 2016-04-18 MED ORDER — HEPARIN (PORCINE) IN NACL 100-0.45 UNIT/ML-% IJ SOLN
INTRAMUSCULAR | Status: AC
  Filled 2016-04-18: qty 250

## 2016-04-18 MED ORDER — MIDAZOLAM HCL 2 MG/2ML IJ SOLN: 0.5000 mg | Freq: Once | INTRAMUSCULAR | Status: DC | PRN

## 2016-04-18 MED ORDER — POTASSIUM CHLORIDE CRYS ER 20 MEQ PO TBCR: 20.0000 meq | EXTENDED_RELEASE_TABLET | Freq: Every day | ORAL | Status: DC | PRN

## 2016-04-18 MED ORDER — SODIUM CHLORIDE 0.9 % IV SOLN
INTRAVENOUS | Status: DC | PRN
  Administered 2016-04-18: 500 mL

## 2016-04-18 MED ORDER — WARFARIN - PHARMACIST DOSING INPATIENT: Freq: Every day | Status: DC

## 2016-04-18 MED ORDER — FUROSEMIDE 40 MG PO TABS
40.0000 mg | ORAL_TABLET | ORAL | Status: DC
  Filled 2016-04-18: qty 1

## 2016-04-18 MED ORDER — ALUM & MAG HYDROXIDE-SIMETH 200-200-20 MG/5ML PO SUSP: 15.0000 mL | ORAL | Status: DC | PRN

## 2016-04-18 MED ORDER — FENTANYL CITRATE (PF) 100 MCG/2ML IJ SOLN
INTRAMUSCULAR | Status: AC
  Filled 2016-04-18: qty 4

## 2016-04-18 MED ORDER — WARFARIN SODIUM 5 MG PO TABS
7.5000 mg | ORAL_TABLET | Freq: Once | ORAL | Status: DC
Start: 2016-04-19 — End: 2016-04-19

## 2016-04-18 MED ORDER — PROMETHAZINE HCL 25 MG/ML IJ SOLN: 6.2500 mg | INTRAMUSCULAR | Status: DC | PRN

## 2016-04-18 MED ORDER — DOCUSATE SODIUM 100 MG PO CAPS: 100.0000 mg | ORAL_CAPSULE | Freq: Every day | ORAL | Status: DC

## 2016-04-18 MED ORDER — PHENYLEPHRINE HCL 10 MG/ML IJ SOLN
INTRAVENOUS | Status: DC | PRN
  Administered 2016-04-18: 20 ug/min via INTRAVENOUS

## 2016-04-18 SURGICAL SUPPLY — 47 items
CANISTER SUCT 3000ML PPV (MISCELLANEOUS) ×3 IMPLANT
CATH EMB 5FR 80CM (CATHETERS) ×3 IMPLANT
CLIP TI MEDIUM 24 (CLIP) ×3 IMPLANT
CLIP TI WIDE RED SMALL 24 (CLIP) ×3 IMPLANT
DERMABOND ADVANCED (GAUZE/BANDAGES/DRESSINGS) ×2
DERMABOND ADVANCED .7 DNX12 (GAUZE/BANDAGES/DRESSINGS) ×1 IMPLANT
DRAIN SNY 10X20 3/4 PERF (WOUND CARE) IMPLANT
DRAPE INCISE IOBAN 66X45 STRL (DRAPES) ×3 IMPLANT
ELECT REM PT RETURN 9FT ADLT (ELECTROSURGICAL) ×3
ELECTRODE REM PT RTRN 9FT ADLT (ELECTROSURGICAL) ×1 IMPLANT
EVACUATOR SILICONE 100CC (DRAIN) IMPLANT
FELT TEFLON 4 X1 (Mesh General) IMPLANT
GAUZE SPONGE 4X4 16PLY XRAY LF (GAUZE/BANDAGES/DRESSINGS) IMPLANT
GLOVE BIO SURGEON STRL SZ 6.5 (GLOVE) ×4 IMPLANT
GLOVE BIO SURGEON STRL SZ7.5 (GLOVE) ×9 IMPLANT
GLOVE BIO SURGEONS STRL SZ 6.5 (GLOVE) ×2
GLOVE BIOGEL PI IND STRL 6.5 (GLOVE) ×2 IMPLANT
GLOVE BIOGEL PI IND STRL 7.0 (GLOVE) ×1 IMPLANT
GLOVE BIOGEL PI IND STRL 8 (GLOVE) ×1 IMPLANT
GLOVE BIOGEL PI INDICATOR 6.5 (GLOVE) ×4
GLOVE BIOGEL PI INDICATOR 7.0 (GLOVE) ×2
GLOVE BIOGEL PI INDICATOR 8 (GLOVE) ×2
GLOVE SKINSENSE NS SZ6.5 (GLOVE) ×4
GLOVE SKINSENSE STRL SZ6.5 (GLOVE) ×2 IMPLANT
GOWN STRL REUS W/ TWL LRG LVL3 (GOWN DISPOSABLE) ×4 IMPLANT
GOWN STRL REUS W/TWL LRG LVL3 (GOWN DISPOSABLE) ×8
INSERT FOGARTY SM (MISCELLANEOUS) IMPLANT
KIT BASIN OR (CUSTOM PROCEDURE TRAY) ×3 IMPLANT
KIT ROOM TURNOVER OR (KITS) ×3 IMPLANT
NS IRRIG 1000ML POUR BTL (IV SOLUTION) ×6 IMPLANT
PACK PERIPHERAL VASCULAR (CUSTOM PROCEDURE TRAY) ×3 IMPLANT
PAD ARMBOARD 7.5X6 YLW CONV (MISCELLANEOUS) ×6 IMPLANT
PATCH GORETEX 2X9 (Vascular Products) ×3 IMPLANT
SPONGE SURGIFOAM ABS GEL 100 (HEMOSTASIS) IMPLANT
SUT GORETEX 5 0 TT13 24 (SUTURE) ×3 IMPLANT
SUT PROLENE 5 0 C 1 24 (SUTURE) ×3 IMPLANT
SUT PROLENE 6 0 BV (SUTURE) ×3 IMPLANT
SUT SILK 2 0 FS (SUTURE) ×3 IMPLANT
SUT SILK 3 0 (SUTURE)
SUT SILK 3-0 18XBRD TIE 12 (SUTURE) IMPLANT
SUT VIC AB 2-0 CTB1 (SUTURE) ×6 IMPLANT
SUT VIC AB 3-0 SH 27 (SUTURE) ×4
SUT VIC AB 3-0 SH 27X BRD (SUTURE) ×2 IMPLANT
SUT VICRYL 4-0 PS2 18IN ABS (SUTURE) ×6 IMPLANT
SYRINGE 3CC LL L/F (MISCELLANEOUS) ×3 IMPLANT
TRAY FOLEY W/METER SILVER 16FR (SET/KITS/TRAYS/PACK) ×3 IMPLANT
WATER STERILE IRR 1000ML POUR (IV SOLUTION) ×3 IMPLANT

## 2016-04-18 NOTE — Anesthesia Preprocedure Evaluation (Addendum)
Anesthesia Evaluation  Patient identified by MRN, date of birth, ID band Patient awake    Reviewed: Allergy & Precautions, NPO status , Patient's Chart, lab work & pertinent test results, reviewed documented beta blocker date and time   History of Anesthesia Complications Negative for: history of anesthetic complications  Airway Mallampati: I  TM Distance: >3 FB Neck ROM: Full    Dental  (+) Edentulous Upper, Edentulous Lower   Pulmonary COPD,  COPD inhaler, former smoker (quit 2000),    breath sounds clear to auscultation       Cardiovascular hypertension, Pt. on medications and Pt. on home beta blockers (-) angina+ CAD, + Past MI, + Cardiac Stents, + Peripheral Vascular Disease and + DVT  CHF: subclavian stenosis.  + dysrhythmias Atrial Fibrillation  Rhythm:Regular Rate:Normal  '15 ECHO: EF 45-50%, mild-mod MR, mild-mod AI   Neuro/Psych negative neurological ROS     GI/Hepatic negative GI ROS, Neg liver ROS,   Endo/Other  Morbid obesity  Renal/GU Renal InsufficiencyRenal disease (creat 1.39)     Musculoskeletal  (+) Arthritis ,   Abdominal (+) + obese,   Peds  Hematology  (+) Blood dyscrasia (Coumadin: INR 1.68), ,   Anesthesia Other Findings   Reproductive/Obstetrics                            Anesthesia Physical Anesthesia Plan  ASA: III  Anesthesia Plan: General   Post-op Pain Management:    Induction: Intravenous  Airway Management Planned: Oral ETT  Additional Equipment:   Intra-op Plan:   Post-operative Plan: Extubation in OR  Informed Consent: I have reviewed the patients History and Physical, chart, labs and discussed the procedure including the risks, benefits and alternatives for the proposed anesthesia with the patient or authorized representative who has indicated his/her understanding and acceptance.     Plan Discussed with: CRNA and Surgeon  Anesthesia  Plan Comments: (Plan routine monitors, GETA)        Anesthesia Quick Evaluation

## 2016-04-18 NOTE — Progress Notes (Signed)
  Day of Surgery Note    Subjective:  No complaints; says his foot feels better  Vitals:   04/18/16 1215 04/18/16 1225  BP:  (!) 154/64  Pulse: 70 78  Resp: 15 14  Temp:      Incisions:   Clean and dry without hematoma Extremities:  + doppler signal left DP/peroneal Cardiac:  irregular Lungs:  Non labored   Assessment/Plan:  This is a 76 y.o. male who is s/p  PTFE patch angioplasty of stenosis of left axillobifemoral bypass graft  -pt doing well in the recovery room -heparin at 600U/hr to start at 1500 -dry gauze to left groin to wick moisture -to 4 east when bed available -per PCP note, pt was bridged with Lovenox pre op and will need to be bridged post op.   Leontine Locket, PA-C 04/18/2016 12:30 PM [phone]

## 2016-04-18 NOTE — Progress Notes (Addendum)
Placed on tele to monitor. Beta blocker not given Hr 57

## 2016-04-18 NOTE — Transfer of Care (Signed)
Immediate Anesthesia Transfer of Care Note  Patient: Terry Martinez  Procedure(s) Performed: Procedure(s): Revision with  Angioplasty Axillary_Femoral Stenosis (Bilateral)  Patient Location: PACU  Anesthesia Type:General  Level of Consciousness: awake, alert , oriented and patient cooperative  Airway & Oxygen Therapy: Patient Spontanous Breathing and Patient connected to nasal cannula oxygen  Post-op Assessment: Report given to RN and Post -op Vital signs reviewed and stable  Post vital signs: Reviewed and stable  Last Vitals:  Vitals:   04/17/16 2118 04/18/16 0604  BP: 129/63 (!) 143/61  Pulse: 72 65  Resp: 18 18  Temp: 36.7 C 36.4 C    Last Pain:  Vitals:   04/18/16 0604  TempSrc: Oral         Complications: No apparent anesthesia complications

## 2016-04-18 NOTE — Anesthesia Postprocedure Evaluation (Signed)
Anesthesia Post Note  Patient: Eustaquio Boyden  Procedure(s) Performed: Procedure(s) (LRB): Revision with  Angioplasty Axillary_Femoral Stenosis (Bilateral)  Patient location during evaluation: PACU Anesthesia Type: General Level of consciousness: awake and alert, oriented and patient cooperative Pain management: pain level controlled Vital Signs Assessment: post-procedure vital signs reviewed and stable Respiratory status: spontaneous breathing, nonlabored ventilation, respiratory function stable and patient connected to nasal cannula oxygen Cardiovascular status: blood pressure returned to baseline and stable Postop Assessment: no signs of nausea or vomiting Anesthetic complications: no       Last Vitals:  Vitals:   04/18/16 1240 04/18/16 1255  BP: (!) 147/60 (!) 149/64  Pulse: 66 76  Resp: 19 18  Temp:      Last Pain:  Vitals:   04/18/16 0604  TempSrc: Oral                 Henslee Lottman,E. Derric Dealmeida

## 2016-04-18 NOTE — Op Note (Signed)
    NAME: Harris Capellan   MRN: [id number] DOB: [birth year]    DATE OF OPERATION: 04/18/2016  PREOP DIAGNOSIS: Status post left axillobifemoral bypass graft  POSTOP DIAGNOSIS: Same  PROCEDURE: PTFE patch angioplasty of stenosis of left axillobifemoral bypass graft  SURGEON: Judeth Cornfield. Scot Dock, MD, FACS  ASSIST: Leontine Locket, PA  ANESTHESIA: Gen.   EBL: Minimal  INDICATIONS: Terry Martinez is a 76 y.o. male who had a left axillobifemoral bypass graft done in [place]. I have been following this. He was found to have an area of increased velocity where the left-to-right femorofemoral graft was anastomosed to the left axillofemoral graft. He presents for revision.  FINDINGS: Felt the simplest approach to addressing the stenosis was PTFE patch angioplasty of the area of stenosis.  TECHNIQUE: The patient was taken to the operative room and received a general anesthetic. Both groins and the left abdomen and chest were prepped and draped in usual sterile fashion. An incision was made over the area where the femorofemoral graft was anastomosed to the axillofemoral graft. The axillofemoral graft, the femorofemoral graft, and the distal axillofemoral graft were controlled. The patient was heparinized. The grafts were all then clamped and a longitudinal graftotomy made over the area of concern exposing the area of stenosis. There was some kinking of the graft here. I then sewed a PTFE patch using 6-0 PTFE  suture. Prior to completing the anastomosis, the arteries were backbled and flushed appropriately and the anastomosis completed. There was good flow at the completion. The heparin was partially reversed with protamine. Hemostasis was obtained in the wound. The wound was closed with 2 deep layers of 2-0 Vicryl and the skin closed with 4-0 Vicryl. Liquid band was applied. The patient tolerated the procedure well and was transferred to the recovery room in stable condition. All needle and  sponge counts were correct.  Deitra Mayo, MD, FACS [facility]  DATE OF DICTATION:   04/18/2016

## 2016-04-18 NOTE — Progress Notes (Addendum)
ANTICOAGULATION CONSULT NOTE - Initial Consult  Pharmacy Consult for Coumadin Indication: atrial fibrillation  Allergies  Allergen Reactions  . Zocor [Simvastatin] Hives and Rash    Patient Measurements: Height: 6\' 3"  (190.5 cm) Weight: 284 lb (128.8 kg) IBW/kg (Calculated) : 84.5   Vital Signs: Temp: 97.4 F (36.3 C) (02/27 1125) Temp Source: Oral (02/27 0604) BP: 149/64 (02/27 1255) Pulse Rate: 76 (02/27 1255)  Labs:  Recent Labs  04/17/16 0610 04/17/16 [id number] 04/17/16 1430  HGB  --  12.2* 11.4*  HCT  --  36.0* 36.1*  PLT  --   --  120*  LABPROT 20.0*  --   --   INR 1.68  --   --   CREATININE  --  1.60* 1.39*    Estimated Creatinine Clearance: 66.4 mL/min (by C-G formula based on SCr of 1.39 mg/dL (H)).   Medical History: Past Medical History:  Diagnosis Date  . Anemia    hx low iron  . Arthritis    "hands" (2/262018)  . Atrial fibrillation ([place])   . Basal cell carcinoma    "left side of my face"  . Charcot's arthropathy   . Chronic pain   . Constipation   . COPD (chronic obstructive pulmonary disease) ([place])   . Coronary artery disease   . DVT, lower extremity ([place])    many years  . Dyspnea    with exertion  . Dysrhythmia    AFib  . Family history of adverse reaction to anesthesia    sister has difficulty waking up  . Gout   . History of blood transfusion 1960s   "related to being cut up w/barbed wire"  . History of kidney stones   . History of kidney stones   . History of kidney stones   . Hyperlipidemia   . Hypertension   . Incisional hernia    x 2  . Myocardial infarction    3 stents  . Peripheral artery disease ([place])   . Pneumonia   . 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 Subclavian artery stenosis, left (HCC)    [name] 04/17/2016    Medications:  Prescriptions Prior to Admission  Medication Sig Dispense Refill Last Dose  . acetaminophen (TYLENOL) 500 MG tablet Take 500-1,000 mg by mouth every 6 (six)  hours as needed for moderate pain or headache.   Past Month at Unknown time  . albuterol (PROVENTIL HFA;VENTOLIN HFA) 108 (90 BASE) MCG/ACT inhaler Inhale 2 puffs into the lungs every 6 (six) hours as needed for wheezing or shortness of breath. 1 Inhaler 2 Past Month at Unknown time  . allopurinol (ZYLOPRIM) 100 MG tablet TAKE ONE TABLET BY MOUTH ONCE DAILY IN THE EVENING 90 tablet 2 04/16/2016 at Unknown time  . allopurinol (ZYLOPRIM) 300 MG tablet TAKE ONE TABLET BY MOUTH ONCE DAILY IN THE MORNING 90 tablet 3 04/16/2016 at Unknown time  . Ascorbic Acid (VITAMIN C PO) Take 1 tablet by mouth daily.   04/16/2016 at Unknown time  . Cholecalciferol (VITAMIN D-3 PO) Take 1 tablet by mouth daily.   04/16/2016 at Unknown time  . enalapril (VASOTEC) 10 MG tablet TAKE ONE TABLET BY MOUTH ONCE DAILY 90 tablet 1 04/17/2016 at 0400  . enoxaparin (LOVENOX) 150 MG/ML injection Inject 0.9 mLs (135 mg total) into the skin every 12 (twelve) hours. 6 mL 0 04/16/2016 at 0700  . EPINEPHrine (EPI-PEN) 0.3 mg/0.3 mL SOAJ injection Inject 0.3 mLs (0.3 mg  total) into the muscle once. (Patient taking differently: Inject 0.3 mg into the muscle once as needed (For anaphylaxis.). ) 1 Device 1 Taking  . furosemide (LASIX) 40 MG tablet Take 1 tablet (40 mg total) by mouth daily. 30 tablet 4 04/16/2016 at Unknown time  . lovastatin (MEVACOR) 20 MG tablet TAKE ONE TABLET BY MOUTH AT BEDTIME 90 tablet 2 04/16/2016 at Unknown time  . metoprolol tartrate (LOPRESSOR) 25 MG tablet Take 0.5 tablets (12.5 mg total) by mouth 2 (two) times daily. 60 tablet 6 04/17/2016 at 0400  . metroNIDAZOLE (METROGEL) 1 % gel Apply topically daily. For rosacea (Patient taking differently: Apply 1 application topically daily as needed (rosacea). ) 45 g 2 Past Month at Unknown time  . nitroGLYCERIN (NITROSTAT) 0.4 MG SL tablet Place 1 tablet (0.4 mg total) under the tongue every 5 (five) minutes as needed for chest pain. 90 tablet 3 Taking  . polyethylene glycol  powder (GLYCOLAX/MIRALAX) powder DISSOLVE 17G OF POWDER IN 8 OUNCES OF LIQUID AND DRINK 2 TIMES PER DAY AS NEEDED FOR MILD CONSTIPATION. (Patient taking differently: Take 8.5 g by mouth daily. ) 527 g 11 04/16/2016 at Unknown time  . potassium chloride SA (KLOR-CON M20) 20 MEQ tablet Take 1 tablet (20 mEq total) by mouth daily. 90 tablet 2 04/16/2016 at Unknown time  . warfarin (COUMADIN) 5 MG tablet Take 1 tablet (5 mg total) by mouth daily. Or as directed (Patient taking differently: Take 2.5-5 mg by mouth every evening. Take half a tablet (2.5mg ) on Monday and Friday and one tablet (5mg ) daily the rest of the week.) 90 tablet 3 04/12/2016 at 2000  . Carboxymethylcellul-Glycerin (LUBRICATING EYE DROPS OP) Apply 1 drop to eye daily as needed (dry eyes).   More than a month at Unknown time  . furosemide (LASIX) 20 MG tablet TAKE TWO TABLETS BY MOUTH ONCE DAILY (Patient not taking: Reported on 04/14/2016) 120 tablet 0 Not Taking at Unknown time    Assessment: 76 y.o male admitted on 04/17/16 for surgery,left axillofemoral bypass graft. He was on enoxaparin and warfarin PTA for Afib, PVD.  Today s/p Revision of bypass and pharmacy is consulted to dose coumadin. Currently on IV heparin infusion @ 600 units/hr per Vascular surgeryINR = 1.68 yesterday 04/17/16.  Last coumadin taken 2/21 PTA.    PTA coumadin dose: 5 mg daily except 2.5 mg ever Monday and Friday. (last taken 2/21)    Goal of Therapy:  INR 2-3 Monitor platelets by anticoagulation protocol: Yes   Plan:  Coumadin 7.5 mg tonight x1 Daily PT/INR Continues on IV heparin 600 units/hr -confirmed with PACU RN.  Nicole Cella, RPh Clinical Pharmacist Pager: [phone] 4P-10P [place] 04/18/2016,4:00 PM

## 2016-04-18 NOTE — Progress Notes (Signed)
Pre Procedure note for inpatients:   [facility] has been scheduled for Procedure(s): REVISION OF LEFT AXILLARY TO BIFEMORAL BYPASS (Bilateral) today. The various methods of treatment have been discussed with the patient. After consideration of the risks, benefits and treatment options the patient has consented to the planned procedure.   The patient has been seen and labs reviewed. There are no changes in the patient's condition to prevent proceeding with the planned procedure today.  Recent labs:  Lab Results  Component Value Date   WBC 4.9 04/17/2016   HGB 11.4 (L) 04/17/2016   HCT 36.1 (L) 04/17/2016   PLT 120 (L) 04/17/2016   GLUCOSE 96 04/17/2016   CHOL 91 (L) 12/06/2015   TRIG 77 12/06/2015   HDL 50 12/06/2015   LDLCALC 26 12/06/2015   ALT 12 05/08/2014   AST 19 05/08/2014   NA 141 04/17/2016   K 4.4 04/17/2016   CL 103 04/17/2016   CREATININE 1.39 (H) 04/17/2016   BUN 33 (H) 04/17/2016   CO2 25 12/06/2015   INR 1.68 04/17/2016   HGBA1C 5.8 01/03/2016    Deitra Mayo, MD 04/18/2016 7:30 AM

## 2016-04-18 NOTE — Anesthesia Procedure Notes (Signed)
Procedure Name: Intubation Date/Time: 04/18/2016 9:43 AM Performed by: Shirlyn Goltz Pre-anesthesia Checklist: Patient identified, Emergency Drugs available, Suction available and Patient being monitored Patient Re-evaluated:Patient Re-evaluated prior to inductionOxygen Delivery Method: Circle system utilized Preoxygenation: Pre-oxygenation with 100% oxygen Intubation Type: IV induction Ventilation: Mask ventilation without difficulty and Oral airway inserted - appropriate to patient size Laryngoscope Size: Mac and 4 Grade View: Grade I Tube type: Oral Tube size: 7.5 mm Number of attempts: 1 Airway Equipment and Method: Stylet Placement Confirmation: ETT inserted through vocal cords under direct vision,  positive ETCO2 and breath sounds checked- equal and bilateral Secured at: 23 cm Tube secured with: Tape Dental Injury: Teeth and Oropharynx as per pre-operative assessment

## 2016-04-19 ENCOUNTER — Encounter (HOSPITAL_COMMUNITY): Payer: Medicare Other

## 2016-04-19 ENCOUNTER — Encounter (HOSPITAL_COMMUNITY): Payer: Self-pay | Admitting: Vascular Surgery

## 2016-04-19 LAB — CBC
HEMATOCRIT: 35.2 % — AB (ref 39.0–52.0)
HEMOGLOBIN: 11 g/dL — AB (ref 13.0–17.0)
MCH: 30.6 pg (ref 26.0–34.0)
MCHC: 31.3 g/dL (ref 30.0–36.0)
MCV: 97.8 fL (ref 78.0–100.0)
Platelets: 122 10*3/uL — ABNORMAL LOW (ref 150–400)
RBC: 3.6 MIL/uL — ABNORMAL LOW (ref 4.22–5.81)
RDW: 15.8 % — AB (ref 11.5–15.5)
WBC: 6.7 10*3/uL (ref 4.0–10.5)

## 2016-04-19 LAB — PROTIME-INR
INR: 1.26
Prothrombin Time: 15.9 seconds — ABNORMAL HIGH (ref 11.4–15.2)

## 2016-04-19 LAB — BASIC METABOLIC PANEL
Anion gap: 6 (ref 5–15)
BUN: 27 mg/dL — AB (ref 6–20)
CALCIUM: 8.5 mg/dL — AB (ref 8.9–10.3)
CHLORIDE: 103 mmol/L (ref 101–111)
CO2: 28 mmol/L (ref 22–32)
CREATININE: 1.42 mg/dL — AB (ref 0.61–1.24)
GFR calc Af Amer: 54 mL/min — ABNORMAL LOW (ref >60)
GFR calc non Af Amer: 47 mL/min — ABNORMAL LOW (ref >60)
GLUCOSE: 115 mg/dL — AB (ref 65–99)
Potassium: 4 mmol/L (ref 3.5–5.1)
Sodium: 137 mmol/L (ref 135–145)

## 2016-04-19 LAB — APTT: aPTT: 34 seconds (ref 24–36)

## 2016-04-19 LAB — MRSA PCR SCREENING: MRSA by PCR: POSITIVE — AB

## 2016-04-19 MED ORDER — OXYCODONE-ACETAMINOPHEN 5-325 MG PO TABS: 1.0000 | ORAL_TABLET | Freq: Four times a day (QID) | ORAL | 0 refills | Status: DC | PRN

## 2016-04-19 MED ORDER — MUPIROCIN 2 % EX OINT
1.0000 "application " | TOPICAL_OINTMENT | Freq: Two times a day (BID) | CUTANEOUS | Status: DC
  Administered 2016-04-19: 1 via NASAL
  Filled 2016-04-19: qty 22

## 2016-04-19 MED ORDER — CHLORHEXIDINE GLUCONATE CLOTH 2 % EX PADS
6.0000 | MEDICATED_PAD | Freq: Every day | CUTANEOUS | Status: DC
  Administered 2016-04-19: 6 via TOPICAL

## 2016-04-19 NOTE — Progress Notes (Signed)
Monitor displayed Vtach during the night. Patient was having a coughing spell. No distress noted. Patient denies pain the entire night. Walked halfway down hallway this morning and back to room. Now sitting up in chair. Stated the surgeon told him he will be going home this morning. Foley removed and urinated in BR. Bruising to L groin. Intact with no bleeding. Will continue to monitor.

## 2016-04-19 NOTE — Progress Notes (Signed)
[facility] [place]  Subjective  - Doing well and ready to go home.   Objective 136/74 73 98.4 F (36.9 C) (Oral) (!) 21 90%  Intake/Output Summary (Last 24 hours) at 04/19/16 0746 Last data filed at 04/19/16 0600  Gross per 24 hour  Intake           1845.9 ml  Output             3200 ml  Net          -1354.1 ml   Doppler DP signal left LE Left groin soft and incision is healing well without hematoma Heart A fib Lungs non labored breathing   Assessment/Planning: POD #  He has voided, ambulated, and tolerating PO's.  He also reports not having much pain.    Plan to discharge home today He has Lovenox and coumadin at home.  He also has a f/u appoint with his PCP to check his coumadin levels tomorrow.    F/U with Dr. Scot Dock in 2-3 weeks.     Laurence Slate [facility] 04/19/2016 7:46 AM --  Laboratory Lab Results:  Recent Labs  04/17/16 1430 04/19/16 0216  WBC 4.9 6.7  HGB 11.4* 11.0*  HCT 36.1* 35.2*  PLT 120* 122*   BMET  Recent Labs  04/17/16 0633 04/17/16 1430 04/19/16 0216  NA 141  --  137  K 4.4  --  4.0  CL 103  --  103  CO2  --   --  28  GLUCOSE 96  --  115*  BUN 33*  --  27*  CREATININE 1.60* 1.39* 1.42*  CALCIUM  --   --  8.5*    COAG Lab Results  Component Value Date   INR 1.26 04/19/2016   INR 1.68 04/17/2016   INR 2.6 03/23/2016   No results found for: PTT

## 2016-04-19 NOTE — Care Management Note (Addendum)
Case Management Note  Patient Details  Name: Terry Martinez MRN: [id number] Date of Birth: [birth year]  Subjective/Objective:    S/p Status post left axillobifemoral bypass graft, conts on heparin drip and ivf, Patient is for dc today, heparin stopped, he has lovenox and coumadin at home, his pcp will check his pt/inr tomorrow. No needs                Action/Plan:   Expected Discharge Date:                  Expected Discharge Plan:  Home/Self Care  In-House Referral:     Discharge planning Services  CM Consult  Post Acute Care Choice:    Choice offered to:     DME Arranged:    DME Agency:     HH Arranged:    HH Agency:     Status of Service:  In process, will continue to follow  If discussed at Long Length of Stay Meetings, dates discussed:    Additional Comments:  Zenon Mayo, RN 04/19/2016, 7:15 AM

## 2016-04-19 NOTE — Progress Notes (Signed)
[place] for Coumadin Indication: atrial fibrillation  Allergies  Allergen Reactions  . Zocor [Simvastatin] Hives and Rash    Patient Measurements: Height: 6\' 3"  (190.5 cm) Weight: 284 lb (128.8 kg) IBW/kg (Calculated) : 84.5   Vital Signs: Temp: 98.4 F (36.9 C) (02/28 0729) Temp Source: Oral (02/28 0729) BP: 136/74 (02/28 0729) Pulse Rate: 73 (02/28 0729)  Labs:  Recent Labs  04/17/16 0610  04/17/16 [id number] 04/17/16 1430 04/18/16 1814 04/19/16 0216  HGB  --   < > 12.2* 11.4*  --  11.0*  HCT  --   --  36.0* 36.1*  --  35.2*  PLT  --   --   --  120*  --  122*  APTT  --   --   --   --  37* 34  LABPROT 20.0*  --   --   --   --  15.9*  INR 1.68  --   --   --   --  1.26  CREATININE  --   --  1.60* 1.39*  --  1.42*  < > = values in this interval not displayed.  Estimated Creatinine Clearance: 65 mL/min (by C-G formula based on SCr of 1.42 mg/dL (H)).   Medical History: Past Medical History:  Diagnosis Date  . Anemia    hx low iron  . Arthritis    "hands" (2/262018)  . Atrial fibrillation ([place])   . Basal cell carcinoma    "left side of my face"  . Charcot's arthropathy   . Chronic pain   . Constipation   . COPD (chronic obstructive pulmonary disease) ([place])   . Coronary artery disease   . DVT, lower extremity ([place])    many years  . Dyspnea    with exertion  . Dysrhythmia    AFib  . Family history of adverse reaction to anesthesia    sister has difficulty waking up  . Gout   . History of blood transfusion 1960s   "related to being cut up w/barbed wire"  . History of kidney stones   . History of kidney stones   . History of kidney stones   . Hyperlipidemia   . Hypertension   . Incisional hernia    x 2  . Myocardial infarction    3 stents  . Peripheral artery disease ([place])   . Pneumonia   . 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 Subclavian artery stenosis, left ([place])    Terry Martinez  04/17/2016    Assessment: 76 y.o male with PMHx of afib that was being bridged with Lovenox while coumadin hold (last dose on 2/21) for planned revision and bypass by vascular sx on 2/27.  S/p procedure and heparin infusion at 600 units/hr per vascular sx and pharmacy to assist with dosing coumadin.  INR this am is 1.26 and CBC stable.   PTA coumadin dose: 5 mg daily except 2.5 mg every Tuesday and Friday. (last taken 2/21)  Goal of Therapy:  INR 2-3 Monitor platelets by anticoagulation protocol: Yes   Plan:  1. Resume Coumadin at 7.5 mg tonight x1 2. Daily PT/INR 3. Continues on IV heparin 600 units/hr - per vascular sx 4. Noted plans to d/c home with Lovenox and coumadin bridge  Vincenza Hews, PharmD, BCPS 04/19/2016, 7:43 AM

## 2016-04-19 NOTE — Care Management Note (Signed)
Case Management Note  Patient Details  Name: Terry Martinez MRN: [id number] Date of Birth: [birth year]  Subjective/Objective:    S/p Status post left axillobifemoral bypass graft, conts on heparin drip and ivf, Patient is for dc today, heparin stopped, he has lovenox and coumadin at home, his pcp will check his pt/inr tomorrow. NCM received message from [place] with [facility] states patient was referred to her today  for [facility] services  for  [facility] and HHPT, NCM contacted patient's wife, she said they are agreeable to receive HHRN and HHPT with [facility].  Soc will begin 24-48 hrs post dc.                             Action/Plan:   Expected Discharge Date:  04/19/16               Expected Discharge Plan:  Home/Self Care  In-House Referral:     Discharge planning Services  CM Consult  Post Acute Care Choice:    Choice offered to:     DME Arranged:    DME Agency:     HH Arranged:   HHRN, [place] Agency:   [facility]  Status of Service:  Completed, signed off  If discussed at [place] of Stay Meetings, dates discussed:    Additional Comments:  Zenon Mayo, RN 04/19/2016, 9:13 AM

## 2016-04-19 NOTE — Care Management Note (Signed)
Case Management Note  Patient Details  Name: Richard Squyres MRN: [id number] Date of Birth: 14-Mar-1940  Subjective/Objective:    S/p Status post left axillobifemoral bypass graft, conts on heparin drip and ivf, Patient is for dc today, heparin stopped, he has lovenox and coumadin at home, his pcp will check his pt/inr tomorrow. No needs                             Action/Plan:   Expected Discharge Date:  04/19/16               Expected Discharge Plan:  Home/Self Care  In-House Referral:     Discharge planning Services  CM Consult  Post Acute Care Choice:    Choice offered to:     DME Arranged:    DME Agency:     HH Arranged:    HH Agency:     Status of Service:  Completed, signed off  If discussed at [employer] of Stay Meetings, dates discussed:    Additional Comments:  Zenon Mayo, RN 04/19/2016, 9:13 AM

## 2016-04-20 ENCOUNTER — Ambulatory Visit: Payer: Medicare Other

## 2016-04-20 ENCOUNTER — Telehealth: Payer: Self-pay | Admitting: Vascular Surgery

## 2016-04-20 DIAGNOSIS — Z5181 Encounter for therapeutic drug level monitoring: Secondary | ICD-10-CM | POA: Diagnosis not present

## 2016-04-20 DIAGNOSIS — Z7901 Long term (current) use of anticoagulants: Secondary | ICD-10-CM | POA: Diagnosis not present

## 2016-04-20 DIAGNOSIS — T82858D Stenosis of vascular prosthetic devices, implants and grafts, subsequent encounter: Secondary | ICD-10-CM | POA: Diagnosis not present

## 2016-04-20 DIAGNOSIS — R279 Unspecified lack of coordination: Secondary | ICD-10-CM | POA: Diagnosis not present

## 2016-04-20 DIAGNOSIS — M6281 Muscle weakness (generalized): Secondary | ICD-10-CM | POA: Diagnosis not present

## 2016-04-20 DIAGNOSIS — I4891 Unspecified atrial fibrillation: Secondary | ICD-10-CM | POA: Diagnosis not present

## 2016-04-20 NOTE — Discharge Summary (Signed)
[facility] Discharge Summary   Patient ID:  Terry Martinez MRN: [id number] DOB/AGE: 07-14-1940 76 y.o.  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 Anemia    hx low iron  . Arthritis    "hands" (2/262018)  . Atrial fibrillation ([place])   . Basal cell carcinoma    "left side of my face"  . Charcot's arthropathy   . Chronic pain   . Constipation   . COPD (chronic obstructive pulmonary disease) ([place])   . Coronary artery disease   . DVT, lower extremity ([place])    many years  . Dyspnea    with exertion  . Dysrhythmia    AFib  . Family history of adverse reaction to anesthesia    sister has difficulty waking up  . Gout   . History of blood transfusion 1960s   "related to being cut up w/barbed wire"  . History of kidney stones   . History of kidney stones   . History of kidney stones   . Hyperlipidemia   . Hypertension   . Incisional hernia    x 2  . Myocardial infarction    3 stents  . Peripheral artery disease ([place])   . Pneumonia   . 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 Subclavian artery stenosis, left ([place])    Terry Martinez 04/17/2016    Procedure(s): Revision with  Angioplasty Axillary_Femoral Stenosis  Discharged Condition: good  HPI: Terry Martinez is a 77 y.o. male who I last saw on 04/07/2015. He is undergone previous revascularization in [facility]. He had a left axillobifemoral bypass graft. He also has a left subclavian artery stent. 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 The patient did have an  arteriogram in March 2016 showed that the right limb of his graft was anastomosed to the proximal right superficial femoral artery. There was no significant infrainguinal arterial occlusive disease.  When I saw him last he had an ABI on the right of 82% with a monophasic posterior tibial signal and a biphasic dorsalis pedis signal. ABI on the left was 84% with a biphasic posterior tibial and dorsalis pedis signal. He had evidence of chronic venous insufficiency and we discussed the importance of leg elevation. He comes in for a 1 year follow up visit.  Arterial duplex: I have inability interpreted his duplex of his axillobifemoral bypass graft. There are some elevated velocities were the femorofemoral graft is anastomosed to the left axillofemoral graft. This graft was done in [facility] in 2013. He is also noted to have elevated velocities in the left subclavian artery were has a stent.  BILATERAL LOWER EXTREMITY ARTERIAL DOPPLER STUDY: I have independently interpreted his bilateral lower arterial Doppler study.  On the right side he has an ABI of 75% with a monophasic posterior tibial signal biphasic dorsalis pedis signal.  On the left side he has an ABI of 87% with a monophasic posterior tibial signal and a biphasic dorsalis pedis signal.  There is evidence of a significant stenosis in the subclavian artery on the left where the patient has reportedly had previous stenting. There are  also elevated velocities where the femorofemoral graft is anastomosed to the axillofemoral graft. Given the risk for graft thrombosis I have recommended arteriography to further assess these areas of concern  Hospital Course:  Terry Martinez is a 76 y.o. male is S/P Left Procedure(s): Revision with  Angioplasty Axillary_Femoral Stenosis  Incisions:   Clean and dry without hematoma Extremities:  + doppler signal left DP/peroneal Cardiac:  irregular Lungs:  Non labored   Assessment/Plan:   This is a 76 y.o. male who is s/p  PTFE patch angioplasty of stenosis of left axillobifemoral bypass graft  -pt doing well in the recovery room -heparin at 600U/hr to start at 1500 -dry gauze to left groin to wick moisture -to 4 east when bed available -per PCP note, pt was bridged with Lovenox pre op and will need to be bridged post op.  POD# 1 Patient was seen by Terry Martinez and myself.   Doppler DP left Incisions healing well Ambulated, tolerating PO's and voided. per PCP note, pt was bridged with Lovenox pre op and will need to be bridged post op.  Stable for discharge  Significant Diagnostic Studies: CBC Lab Results  Component Value Date   WBC 6.7 04/19/2016   HGB 11.0 (L) 04/19/2016   HCT 35.2 (L) 04/19/2016   MCV 97.8 04/19/2016   PLT 122 (L) 04/19/2016    BMET    Component Value Date/Time   NA 137 04/19/2016 0216   K 4.0 04/19/2016 0216   CL 103 04/19/2016 0216   CO2 28 04/19/2016 0216   GLUCOSE 115 (H) 04/19/2016 0216   BUN 27 (H) 04/19/2016 0216   CREATININE 1.42 (H) 04/19/2016 0216   CREATININE 1.43 (H) 12/06/2015 1024   CALCIUM 8.5 (L) 04/19/2016 0216   GFRNONAA 47 (L) 04/19/2016 0216   GFRAA 54 (L) 04/19/2016 0216   COAG Lab Results  Component Value Date   INR 1.26 04/19/2016   INR 1.68 04/17/2016   INR 2.6 03/23/2016     Disposition:  Discharge to :Home Discharge Instructions    Call MD for:  redness, tenderness, or signs of infection (pain, swelling, bleeding, redness, odor or green/yellow discharge around incision site)    Complete by:  As directed    Call MD for:  severe or increased pain, loss or decreased feeling  in affected limb(s)    Complete by:  As directed    Call MD for:  temperature >100.5    Complete by:  As directed    Discharge instructions    Complete by:  As directed    You may shower in 24 hours.  Dry left groin well after shower and keep clean and dry throughout the day.  Place 4x4 over left groin if need be to keep  it dry during the day to prevent breakdown of the incision.   Driving Restrictions    Complete by:  As directed    No driving for 1-2 weeks   Lifting restrictions    Complete by:  As directed    No heavy  lifting for 3-4  weeks   Resume previous diet    Complete by:  As directed      Allergies as of 04/19/2016      Reactions   Zocor [simvastatin] Hives, Rash      Medication List    TAKE these medications   acetaminophen 500 MG tablet Commonly known as:  TYLENOL Take 500-1,000 mg by mouth every 6 (six) hours as needed for moderate pain  or headache.   albuterol 108 (90 Base) MCG/ACT inhaler Commonly known as:  PROVENTIL HFA;VENTOLIN HFA Inhale 2 puffs into the lungs every 6 (six) hours as needed for wheezing or shortness of breath.   allopurinol 100 MG tablet Commonly known as:  ZYLOPRIM TAKE ONE TABLET BY MOUTH ONCE DAILY IN THE EVENING   allopurinol 300 MG tablet Commonly known as:  ZYLOPRIM TAKE ONE TABLET BY MOUTH ONCE DAILY IN THE MORNING   enalapril 10 MG tablet Commonly known as:  VASOTEC TAKE ONE TABLET BY MOUTH ONCE DAILY   enoxaparin 150 MG/ML injection Commonly known as:  LOVENOX Inject 0.9 mLs (135 mg total) into the skin every 12 (twelve) hours.   EPINEPHrine 0.3 mg/0.3 mL Soaj injection Commonly known as:  EPI-PEN Inject 0.3 mLs (0.3 mg total) into the muscle once. What changed:  when to take this  reasons to take this   furosemide 20 MG tablet Commonly known as:  LASIX TAKE TWO TABLETS BY MOUTH ONCE DAILY   furosemide 40 MG tablet Commonly known as:  LASIX Take 1 tablet (40 mg total) by mouth daily.   lovastatin 20 MG tablet Commonly known as:  MEVACOR TAKE ONE TABLET BY MOUTH AT BEDTIME   LUBRICATING EYE DROPS OP Apply 1 drop to eye daily as needed (dry eyes).   metoprolol tartrate 25 MG tablet Commonly known as:  LOPRESSOR Take 0.5 tablets (12.5 mg total) by mouth 2 (two) times daily.   metroNIDAZOLE 1 % gel Commonly known as:   METROGEL Apply topically daily. For rosacea What changed:  how much to take  when to take this  reasons to take this  additional instructions   nitroGLYCERIN 0.4 MG SL tablet Commonly known as:  NITROSTAT Place 1 tablet (0.4 mg total) under the tongue every 5 (five) minutes as needed for chest pain.   oxyCODONE-acetaminophen 5-325 MG tablet Commonly known as:  PERCOCET/ROXICET Take 1 tablet by mouth every 6 (six) hours as needed for moderate pain.   polyethylene glycol powder powder Commonly known as:  GLYCOLAX/MIRALAX DISSOLVE 17G OF POWDER IN 8 OUNCES OF LIQUID AND DRINK 2 TIMES PER DAY AS NEEDED FOR MILD CONSTIPATION. What changed:  how much to take  how to take this  when to take this  additional instructions   potassium chloride SA 20 MEQ tablet Commonly known as:  KLOR-CON M20 Take 1 tablet (20 mEq total) by mouth daily.   VITAMIN C PO Take 1 tablet by mouth daily.   VITAMIN D-3 PO Take 1 tablet by mouth daily.   warfarin 5 MG tablet Commonly known as:  COUMADIN Take 1 tablet (5 mg total) by mouth daily. Or as directed What changed:  how much to take  when to take this  additional instructions      Verbal and written Discharge instructions given to the patient. Wound care per Discharge AVS Follow-up Information    Deitra Mayo, MD Follow up in 2 week(s).   Specialties:  Vascular Surgery, Cardiology Why:  F/U 2-3 weeks office will call for appointment.  Contact information: [place] [phone]        [facility] Follow up.   Specialty:  Home Health Services Why:  [facility], HHPT Contact information: [place] [id number] [phone]           Signed: Laurence Slate [facility] 04/20/2016, 1:14 PM

## 2016-04-20 NOTE — Telephone Encounter (Signed)
-----   Message from Mena Goes, RN sent at 04/19/2016  1:58 PM EST ----- Regarding: add ABIs to post op visit   ----- Message ----- From: Ulyses Amor, PA-C Sent: 04/19/2016  11:23 AM To: Vvs Charge Pool  Needs ABI's at f/u post op visit with Dr. Donnetta Hutching thx

## 2016-04-20 NOTE — Telephone Encounter (Signed)
Sched ABI 04/26/16 at 2:00. Lm on hm# to inform pt of additional appt to [facility] on 05/03/16 at 2:00.

## 2016-04-21 ENCOUNTER — Encounter: Payer: Self-pay | Admitting: Vascular Surgery

## 2016-04-21 DIAGNOSIS — T82858D Stenosis of vascular prosthetic devices, implants and grafts, subsequent encounter: Secondary | ICD-10-CM | POA: Diagnosis not present

## 2016-04-21 DIAGNOSIS — R279 Unspecified lack of coordination: Secondary | ICD-10-CM | POA: Diagnosis not present

## 2016-04-21 DIAGNOSIS — M6281 Muscle weakness (generalized): Secondary | ICD-10-CM | POA: Diagnosis not present

## 2016-04-21 DIAGNOSIS — I4891 Unspecified atrial fibrillation: Secondary | ICD-10-CM | POA: Diagnosis not present

## 2016-04-21 DIAGNOSIS — Z7901 Long term (current) use of anticoagulants: Secondary | ICD-10-CM | POA: Diagnosis not present

## 2016-04-21 DIAGNOSIS — Z5181 Encounter for therapeutic drug level monitoring: Secondary | ICD-10-CM | POA: Diagnosis not present

## 2016-04-24 ENCOUNTER — Telehealth: Payer: Self-pay | Admitting: Family Medicine

## 2016-04-24 ENCOUNTER — Ambulatory Visit: Payer: Medicare Other

## 2016-04-24 NOTE — Telephone Encounter (Signed)
Terry Martinez called and would like to know if Aul can get his coumadin through [facility] since they are out there most days and it would be very convenient for them and also [facility] said that they can do that with no problem. jw

## 2016-04-24 NOTE — Telephone Encounter (Signed)
This is no problem for me.  Would they also check INR?  Not sure who to call back about this, but it is ok with me.  [name], MD, MPH PGY-3,  [facility] 04/24/2016 2:16 PM

## 2016-04-24 NOTE — Telephone Encounter (Signed)
Cindy with [facility] INR machine is broken.  She wanted to get the "ok" to come back and check the INR in the am.  Verbal order given and will forward message to MD . Clinton Sawyer, Salome Spotted, [place]

## 2016-04-25 ENCOUNTER — Telehealth: Payer: Self-pay | Admitting: Family Medicine

## 2016-04-25 ENCOUNTER — Other Ambulatory Visit: Payer: Self-pay

## 2016-04-25 DIAGNOSIS — M6281 Muscle weakness (generalized): Secondary | ICD-10-CM | POA: Diagnosis not present

## 2016-04-25 DIAGNOSIS — Z48812 Encounter for surgical aftercare following surgery on the circulatory system: Secondary | ICD-10-CM

## 2016-04-25 DIAGNOSIS — Z5181 Encounter for therapeutic drug level monitoring: Secondary | ICD-10-CM | POA: Diagnosis not present

## 2016-04-25 DIAGNOSIS — Z7901 Long term (current) use of anticoagulants: Secondary | ICD-10-CM | POA: Diagnosis not present

## 2016-04-25 DIAGNOSIS — I4891 Unspecified atrial fibrillation: Secondary | ICD-10-CM | POA: Diagnosis not present

## 2016-04-25 DIAGNOSIS — Z95828 Presence of other vascular implants and grafts: Secondary | ICD-10-CM

## 2016-04-25 DIAGNOSIS — R279 Unspecified lack of coordination: Secondary | ICD-10-CM | POA: Diagnosis not present

## 2016-04-25 DIAGNOSIS — T82858D Stenosis of vascular prosthetic devices, implants and grafts, subsequent encounter: Secondary | ICD-10-CM | POA: Diagnosis not present

## 2016-04-25 NOTE — Telephone Encounter (Signed)
Spoke with cindy from [facility]. She called to report INR on pt, which was 2.4. Pt reported no missed doses, bleeding, changes in diet, or trips to the ED. Per dr Ree Kida he is to continue 2.5mg  tues/fri and 5mg  other days; and recheck in 4 weeks. Cindy and front office staff were informed to be tranfer/transfer calls to the lab when reporting INR. Koraima Albertsen Kennon Holter, CMA

## 2016-04-25 NOTE — Telephone Encounter (Signed)
Pt's INR is 2.4. ep

## 2016-04-26 ENCOUNTER — Ambulatory Visit (HOSPITAL_COMMUNITY)
Admission: RE | Admit: 2016-04-26 | Discharge: 2016-04-26 | Disposition: A | Discharge status: Home / Self Care | Payer: Medicare Other | Source: Ambulatory Visit | Attending: Vascular Surgery | Admitting: Vascular Surgery

## 2016-04-26 DIAGNOSIS — I70201 Unspecified atherosclerosis of native arteries of extremities, right leg: Secondary | ICD-10-CM | POA: Diagnosis not present

## 2016-04-26 DIAGNOSIS — Z48812 Encounter for surgical aftercare following surgery on the circulatory system: Secondary | ICD-10-CM | POA: Insufficient documentation

## 2016-04-26 DIAGNOSIS — Z95828 Presence of other vascular implants and grafts: Secondary | ICD-10-CM | POA: Diagnosis not present

## 2016-04-28 DIAGNOSIS — I4891 Unspecified atrial fibrillation: Secondary | ICD-10-CM | POA: Diagnosis not present

## 2016-04-28 DIAGNOSIS — Z7901 Long term (current) use of anticoagulants: Secondary | ICD-10-CM | POA: Diagnosis not present

## 2016-04-28 DIAGNOSIS — Z5181 Encounter for therapeutic drug level monitoring: Secondary | ICD-10-CM | POA: Diagnosis not present

## 2016-04-28 DIAGNOSIS — R279 Unspecified lack of coordination: Secondary | ICD-10-CM | POA: Diagnosis not present

## 2016-04-28 DIAGNOSIS — M6281 Muscle weakness (generalized): Secondary | ICD-10-CM | POA: Diagnosis not present

## 2016-04-28 DIAGNOSIS — T82858D Stenosis of vascular prosthetic devices, implants and grafts, subsequent encounter: Secondary | ICD-10-CM | POA: Diagnosis not present

## 2016-04-30 ENCOUNTER — Other Ambulatory Visit: Payer: Self-pay | Admitting: Family Medicine

## 2016-05-01 DIAGNOSIS — Z5181 Encounter for therapeutic drug level monitoring: Secondary | ICD-10-CM | POA: Diagnosis not present

## 2016-05-01 DIAGNOSIS — I4891 Unspecified atrial fibrillation: Secondary | ICD-10-CM | POA: Diagnosis not present

## 2016-05-01 DIAGNOSIS — T82858D Stenosis of vascular prosthetic devices, implants and grafts, subsequent encounter: Secondary | ICD-10-CM | POA: Diagnosis not present

## 2016-05-01 DIAGNOSIS — M6281 Muscle weakness (generalized): Secondary | ICD-10-CM | POA: Diagnosis not present

## 2016-05-01 DIAGNOSIS — Z7901 Long term (current) use of anticoagulants: Secondary | ICD-10-CM | POA: Diagnosis not present

## 2016-05-01 DIAGNOSIS — R279 Unspecified lack of coordination: Secondary | ICD-10-CM | POA: Diagnosis not present

## 2016-05-02 DIAGNOSIS — T82858D Stenosis of vascular prosthetic devices, implants and grafts, subsequent encounter: Secondary | ICD-10-CM | POA: Diagnosis not present

## 2016-05-02 DIAGNOSIS — Z7901 Long term (current) use of anticoagulants: Secondary | ICD-10-CM | POA: Diagnosis not present

## 2016-05-02 DIAGNOSIS — M6281 Muscle weakness (generalized): Secondary | ICD-10-CM | POA: Diagnosis not present

## 2016-05-02 DIAGNOSIS — I4891 Unspecified atrial fibrillation: Secondary | ICD-10-CM | POA: Diagnosis not present

## 2016-05-02 DIAGNOSIS — R279 Unspecified lack of coordination: Secondary | ICD-10-CM | POA: Diagnosis not present

## 2016-05-02 DIAGNOSIS — Z5181 Encounter for therapeutic drug level monitoring: Secondary | ICD-10-CM | POA: Diagnosis not present

## 2016-05-03 ENCOUNTER — Ambulatory Visit (INDEPENDENT_AMBULATORY_CARE_PROVIDER_SITE_OTHER): Payer: Self-pay | Admitting: Vascular Surgery

## 2016-05-03 ENCOUNTER — Encounter: Payer: Self-pay | Admitting: Vascular Surgery

## 2016-05-03 VITALS — BP 157/81 | HR 58 | Temp 97.1°F | Resp 20 | Ht 75.0 in | Wt 301.4 lb

## 2016-05-03 DIAGNOSIS — R279 Unspecified lack of coordination: Secondary | ICD-10-CM | POA: Diagnosis not present

## 2016-05-03 DIAGNOSIS — Z48812 Encounter for surgical aftercare following surgery on the circulatory system: Secondary | ICD-10-CM

## 2016-05-03 DIAGNOSIS — M6281 Muscle weakness (generalized): Secondary | ICD-10-CM | POA: Diagnosis not present

## 2016-05-03 DIAGNOSIS — Z7901 Long term (current) use of anticoagulants: Secondary | ICD-10-CM | POA: Diagnosis not present

## 2016-05-03 DIAGNOSIS — T82858D Stenosis of vascular prosthetic devices, implants and grafts, subsequent encounter: Secondary | ICD-10-CM | POA: Diagnosis not present

## 2016-05-03 DIAGNOSIS — Z5181 Encounter for therapeutic drug level monitoring: Secondary | ICD-10-CM | POA: Diagnosis not present

## 2016-05-03 DIAGNOSIS — I739 Peripheral vascular disease, unspecified: Secondary | ICD-10-CM

## 2016-05-03 DIAGNOSIS — I4891 Unspecified atrial fibrillation: Secondary | ICD-10-CM | POA: Diagnosis not present

## 2016-05-03 NOTE — Progress Notes (Signed)
Patient name: Terry Martinez MRN: [id number] DOB: 04-Aug-1940 Sex: male  REASON FOR VISIT: Follow up  HPI: Terry Martinez is a 76 y.o. male who had a left axillobifemoral bypass graft done in [place]. I have been following this. He had an area of increased velocities with a left to right femorofemoral bypass graft was anastomosed to the left axillofemoral bypass graft. I took him to the operative room on 04/18/2016 and he underwent PTFE patch angioplasty of the stenosis. He returns for an outpatient visit.  He comes in for routine follow up visit. He has no specific complaints. He has put on 20 pounds and states that he's been eating way too many sweets.  Current Outpatient Prescriptions  Medication Sig Dispense Refill  . acetaminophen (TYLENOL) 500 MG tablet Take 500-1,000 mg by mouth every 6 (six) hours as needed for moderate pain or headache.    . albuterol (PROVENTIL HFA;VENTOLIN HFA) 108 (90 BASE) MCG/ACT inhaler Inhale 2 puffs into the lungs every 6 (six) hours as needed for wheezing or shortness of breath. 1 Inhaler 2  . allopurinol (ZYLOPRIM) 100 MG tablet TAKE ONE TABLET BY MOUTH ONCE DAILY IN THE EVENING 90 tablet 2  . allopurinol (ZYLOPRIM) 300 MG tablet TAKE ONE TABLET BY MOUTH ONCE DAILY IN THE MORNING 90 tablet 3  . Ascorbic Acid (VITAMIN C PO) Take 1 tablet by mouth daily.    . Carboxymethylcellul-Glycerin (LUBRICATING EYE DROPS OP) Apply 1 drop to eye daily as needed (dry eyes).    . Cholecalciferol (VITAMIN D-3 PO) Take 1 tablet by mouth daily.    . enalapril (VASOTEC) 10 MG tablet TAKE ONE TABLET BY MOUTH ONCE DAILY 90 tablet 1  . enoxaparin (LOVENOX) 150 MG/ML injection Inject 0.9 mLs (135 mg total) into the skin every 12 (twelve) hours. 6 mL 0  . EPINEPHrine (EPI-PEN) 0.3 mg/0.3 mL SOAJ injection Inject 0.3 mLs (0.3 mg total) into the muscle once. (Patient taking differently: Inject 0.3 mg into the muscle once as needed (For anaphylaxis.). ) 1 Device 1  .  furosemide (LASIX) 20 MG tablet TAKE TWO TABLETS BY MOUTH ONCE DAILY 120 tablet 0  . furosemide (LASIX) 40 MG tablet Take 1 tablet (40 mg total) by mouth daily. 30 tablet 4  . lovastatin (MEVACOR) 20 MG tablet TAKE ONE TABLET BY MOUTH AT BEDTIME 90 tablet 2  . metoprolol tartrate (LOPRESSOR) 25 MG tablet Take 0.5 tablets (12.5 mg total) by mouth 2 (two) times daily. 60 tablet 6  . metroNIDAZOLE (METROGEL) 1 % gel Apply topically daily. For rosacea (Patient taking differently: Apply 1 application topically daily as needed (rosacea). ) 45 g 2  . nitroGLYCERIN (NITROSTAT) 0.4 MG SL tablet Place 1 tablet (0.4 mg total) under the tongue every 5 (five) minutes as needed for chest pain. 90 tablet 3  . oxyCODONE-acetaminophen (PERCOCET/ROXICET) 5-325 MG tablet Take 1 tablet by mouth every 6 (six) hours as needed for moderate pain. 10 tablet 0  . polyethylene glycol powder (GLYCOLAX/MIRALAX) powder DISSOLVE 17G OF POWDER IN 8 OUNCES OF LIQUID AND DRINK 2 TIMES PER DAY AS NEEDED FOR MILD CONSTIPATION. (Patient taking differently: Take 8.5 g by mouth daily. ) 527 g 11  . potassium chloride SA (KLOR-CON M20) 20 MEQ tablet Take 1 tablet (20 mEq total) by mouth daily. 90 tablet 2  . warfarin (COUMADIN) 5 MG tablet Take 1 tablet (5 mg total) by mouth daily. Or as directed (Patient taking differently: Take 2.5-5 mg by mouth every evening. Take  half a tablet (2.5mg ) on Monday and Friday and one tablet (5mg ) daily the rest of the week.) 90 tablet 3   No current facility-administered medications for this visit.     REVIEW OF SYSTEMS:  [X]  denotes positive finding, [ ]  denotes negative finding Cardiac  Comments:  Chest pain or chest pressure:    Shortness of breath upon exertion:    Short of breath when lying flat:    Irregular heart rhythm:    Constitutional    Fever or chills:      PHYSICAL EXAM: Vitals:   05/03/16 1350 05/03/16 1357  BP: (!) 153/79 (!) 157/81  Pulse: (!) 58   Resp: 20   Temp: 97.1 F  (36.2 C)   TempSrc: Oral   SpO2: 94%   Weight: (!) 301 lb 6.4 oz (136.7 kg)   Height: 6\' 3"  (1.905 m)     GENERAL: The patient is a well-nourished male, in no acute distress. The vital signs are documented above. CARDIOVASCULAR: There is a regular rate and rhythm. PULMONARY: There is good air exchange bilaterally without wheezing or rales. His incision in the left lower quadrant is healed nicely. Both feet are warm and well perfused.  MEDICAL ISSUES:  STATUS POST REVISION OF LEFT AXILLOBIFEMORAL BYPASS GRAFT: The patient is doing well status post ReVision of left axillobifemoral bypass graft. He would like to continue the physical therapy which she is receiving as an outpatient. He has requested that we stop the occupational therapy and nurse visit. I've encouraged him to stay as active as possible. I will order a graft duplex and ABIs in 6 months and I will see him back at that time. He knows to call sooner if he has problems.  Deitra Mayo [facility] [phone]

## 2016-05-04 ENCOUNTER — Telehealth: Payer: Self-pay | Admitting: Vascular Surgery

## 2016-05-04 DIAGNOSIS — R279 Unspecified lack of coordination: Secondary | ICD-10-CM | POA: Diagnosis not present

## 2016-05-04 DIAGNOSIS — I4891 Unspecified atrial fibrillation: Secondary | ICD-10-CM | POA: Diagnosis not present

## 2016-05-04 DIAGNOSIS — Z7901 Long term (current) use of anticoagulants: Secondary | ICD-10-CM | POA: Diagnosis not present

## 2016-05-04 DIAGNOSIS — T82858D Stenosis of vascular prosthetic devices, implants and grafts, subsequent encounter: Secondary | ICD-10-CM | POA: Diagnosis not present

## 2016-05-04 DIAGNOSIS — Z5181 Encounter for therapeutic drug level monitoring: Secondary | ICD-10-CM | POA: Diagnosis not present

## 2016-05-04 DIAGNOSIS — M6281 Muscle weakness (generalized): Secondary | ICD-10-CM | POA: Diagnosis not present

## 2016-05-04 NOTE — Telephone Encounter (Signed)
Per Dr. Scot Dock, Keep Physical Therapy and discontinue Occupational therapy and nursing.  Called [facility] and gave verbal order to [place] at [phone].  Ashleigh E., LPN.

## 2016-05-05 DIAGNOSIS — Z5181 Encounter for therapeutic drug level monitoring: Secondary | ICD-10-CM | POA: Diagnosis not present

## 2016-05-05 DIAGNOSIS — I4891 Unspecified atrial fibrillation: Secondary | ICD-10-CM | POA: Diagnosis not present

## 2016-05-05 DIAGNOSIS — R279 Unspecified lack of coordination: Secondary | ICD-10-CM | POA: Diagnosis not present

## 2016-05-05 DIAGNOSIS — Z7901 Long term (current) use of anticoagulants: Secondary | ICD-10-CM | POA: Diagnosis not present

## 2016-05-05 DIAGNOSIS — M6281 Muscle weakness (generalized): Secondary | ICD-10-CM | POA: Diagnosis not present

## 2016-05-05 DIAGNOSIS — T82858D Stenosis of vascular prosthetic devices, implants and grafts, subsequent encounter: Secondary | ICD-10-CM | POA: Diagnosis not present

## 2016-05-05 NOTE — Addendum Note (Signed)
Addended by: Lianne Cure A on: 05/05/2016 12:06 PM   Modules accepted: Orders

## 2016-05-08 ENCOUNTER — Other Ambulatory Visit: Payer: Self-pay | Admitting: Family Medicine

## 2016-05-08 DIAGNOSIS — Z7901 Long term (current) use of anticoagulants: Secondary | ICD-10-CM | POA: Diagnosis not present

## 2016-05-08 DIAGNOSIS — T82858D Stenosis of vascular prosthetic devices, implants and grafts, subsequent encounter: Secondary | ICD-10-CM | POA: Diagnosis not present

## 2016-05-08 DIAGNOSIS — M6281 Muscle weakness (generalized): Secondary | ICD-10-CM | POA: Diagnosis not present

## 2016-05-08 DIAGNOSIS — I4891 Unspecified atrial fibrillation: Secondary | ICD-10-CM | POA: Diagnosis not present

## 2016-05-08 DIAGNOSIS — R279 Unspecified lack of coordination: Secondary | ICD-10-CM | POA: Diagnosis not present

## 2016-05-08 DIAGNOSIS — Z5181 Encounter for therapeutic drug level monitoring: Secondary | ICD-10-CM | POA: Diagnosis not present

## 2016-05-08 DIAGNOSIS — L570 Actinic keratosis: Secondary | ICD-10-CM | POA: Diagnosis not present

## 2016-05-10 DIAGNOSIS — Z7901 Long term (current) use of anticoagulants: Secondary | ICD-10-CM | POA: Diagnosis not present

## 2016-05-10 DIAGNOSIS — Z5181 Encounter for therapeutic drug level monitoring: Secondary | ICD-10-CM | POA: Diagnosis not present

## 2016-05-10 DIAGNOSIS — T82858D Stenosis of vascular prosthetic devices, implants and grafts, subsequent encounter: Secondary | ICD-10-CM | POA: Diagnosis not present

## 2016-05-10 DIAGNOSIS — I4891 Unspecified atrial fibrillation: Secondary | ICD-10-CM | POA: Diagnosis not present

## 2016-05-10 DIAGNOSIS — R279 Unspecified lack of coordination: Secondary | ICD-10-CM | POA: Diagnosis not present

## 2016-05-10 DIAGNOSIS — M6281 Muscle weakness (generalized): Secondary | ICD-10-CM | POA: Diagnosis not present

## 2016-05-12 DIAGNOSIS — Z7901 Long term (current) use of anticoagulants: Secondary | ICD-10-CM | POA: Diagnosis not present

## 2016-05-12 DIAGNOSIS — R279 Unspecified lack of coordination: Secondary | ICD-10-CM | POA: Diagnosis not present

## 2016-05-12 DIAGNOSIS — I4891 Unspecified atrial fibrillation: Secondary | ICD-10-CM | POA: Diagnosis not present

## 2016-05-12 DIAGNOSIS — M6281 Muscle weakness (generalized): Secondary | ICD-10-CM | POA: Diagnosis not present

## 2016-05-12 DIAGNOSIS — T82858D Stenosis of vascular prosthetic devices, implants and grafts, subsequent encounter: Secondary | ICD-10-CM | POA: Diagnosis not present

## 2016-05-12 DIAGNOSIS — Z5181 Encounter for therapeutic drug level monitoring: Secondary | ICD-10-CM | POA: Diagnosis not present

## 2016-05-22 ENCOUNTER — Other Ambulatory Visit: Payer: Self-pay | Admitting: Family Medicine

## 2016-05-23 ENCOUNTER — Ambulatory Visit: Payer: Medicare Other

## 2016-05-25 ENCOUNTER — Encounter: Payer: Self-pay | Admitting: Internal Medicine

## 2016-05-26 ENCOUNTER — Other Ambulatory Visit: Payer: Self-pay

## 2016-05-26 ENCOUNTER — Ambulatory Visit (HOSPITAL_COMMUNITY): Payer: Medicare Other | Attending: Internal Medicine

## 2016-05-26 ENCOUNTER — Ambulatory Visit: Payer: Medicare Other

## 2016-05-26 ENCOUNTER — Ambulatory Visit (INDEPENDENT_AMBULATORY_CARE_PROVIDER_SITE_OTHER): Payer: Medicare Other | Admitting: *Deleted

## 2016-05-26 DIAGNOSIS — I340 Nonrheumatic mitral (valve) insufficiency: Secondary | ICD-10-CM

## 2016-05-26 DIAGNOSIS — I4891 Unspecified atrial fibrillation: Secondary | ICD-10-CM | POA: Insufficient documentation

## 2016-05-26 DIAGNOSIS — I10 Essential (primary) hypertension: Secondary | ICD-10-CM | POA: Insufficient documentation

## 2016-05-26 DIAGNOSIS — I2510 Atherosclerotic heart disease of native coronary artery without angina pectoris: Secondary | ICD-10-CM | POA: Insufficient documentation

## 2016-05-26 DIAGNOSIS — I071 Rheumatic tricuspid insufficiency: Secondary | ICD-10-CM | POA: Diagnosis not present

## 2016-05-26 DIAGNOSIS — I252 Old myocardial infarction: Secondary | ICD-10-CM | POA: Diagnosis not present

## 2016-05-26 DIAGNOSIS — I351 Nonrheumatic aortic (valve) insufficiency: Secondary | ICD-10-CM

## 2016-05-26 DIAGNOSIS — E785 Hyperlipidemia, unspecified: Secondary | ICD-10-CM | POA: Diagnosis not present

## 2016-05-26 DIAGNOSIS — I371 Nonrheumatic pulmonary valve insufficiency: Secondary | ICD-10-CM | POA: Diagnosis not present

## 2016-05-26 LAB — POCT INR: INR: 1.7

## 2016-05-31 ENCOUNTER — Ambulatory Visit (INDEPENDENT_AMBULATORY_CARE_PROVIDER_SITE_OTHER): Payer: Medicare Other | Admitting: Orthopedic Surgery

## 2016-05-31 ENCOUNTER — Ambulatory Visit (INDEPENDENT_AMBULATORY_CARE_PROVIDER_SITE_OTHER): Payer: Medicare Other | Admitting: Family
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
## 2016-06-08 ENCOUNTER — Ambulatory Visit (INDEPENDENT_AMBULATORY_CARE_PROVIDER_SITE_OTHER): Payer: Medicare Other | Admitting: Orthopedic Surgery

## 2016-06-08 ENCOUNTER — Ambulatory Visit (INDEPENDENT_AMBULATORY_CARE_PROVIDER_SITE_OTHER): Payer: Medicare Other | Admitting: *Deleted

## 2016-06-08 ENCOUNTER — Encounter (INDEPENDENT_AMBULATORY_CARE_PROVIDER_SITE_OTHER): Payer: Self-pay | Admitting: Orthopedic Surgery

## 2016-06-08 DIAGNOSIS — I4891 Unspecified atrial fibrillation: Secondary | ICD-10-CM | POA: Diagnosis present

## 2016-06-08 DIAGNOSIS — Z89411 Acquired absence of right great toe: Secondary | ICD-10-CM | POA: Diagnosis not present

## 2016-06-08 DIAGNOSIS — B351 Tinea unguium: Secondary | ICD-10-CM | POA: Diagnosis not present

## 2016-06-08 DIAGNOSIS — Z89421 Acquired absence of other right toe(s): Secondary | ICD-10-CM

## 2016-06-08 DIAGNOSIS — I70209 Unspecified atherosclerosis of native arteries of extremities, unspecified extremity: Secondary | ICD-10-CM

## 2016-06-08 HISTORY — DX: Acquired absence of other right toe(s): Z89.421

## 2016-06-08 LAB — POCT INR: INR: 2.8
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
## 2016-06-09 ENCOUNTER — Ambulatory Visit: Payer: Medicare Other

## 2016-06-12 ENCOUNTER — Ambulatory Visit (INDEPENDENT_AMBULATORY_CARE_PROVIDER_SITE_OTHER): Payer: Medicare Other | Admitting: Internal Medicine

## 2016-06-12 ENCOUNTER — Encounter: Payer: Self-pay | Admitting: Internal Medicine

## 2016-06-12 VITALS — BP 130/68 | HR 65 | Ht 75.0 in | Wt 296.0 lb

## 2016-06-12 DIAGNOSIS — I351 Nonrheumatic aortic (valve) insufficiency: Secondary | ICD-10-CM

## 2016-06-12 DIAGNOSIS — I2510 Atherosclerotic heart disease of native coronary artery without angina pectoris: Secondary | ICD-10-CM | POA: Diagnosis not present

## 2016-06-12 DIAGNOSIS — R7309 Other abnormal glucose: Secondary | ICD-10-CM

## 2016-06-12 DIAGNOSIS — I4819 Other persistent atrial fibrillation: Secondary | ICD-10-CM

## 2016-06-12 DIAGNOSIS — I70209 Unspecified atherosclerosis of native arteries of extremities, unspecified extremity: Secondary | ICD-10-CM | POA: Diagnosis not present

## 2016-06-12 DIAGNOSIS — I10 Essential (primary) hypertension: Secondary | ICD-10-CM

## 2016-06-12 DIAGNOSIS — E782 Mixed hyperlipidemia: Secondary | ICD-10-CM

## 2016-06-12 DIAGNOSIS — I481 Persistent atrial fibrillation: Secondary | ICD-10-CM | POA: Diagnosis not present

## 2016-06-12 NOTE — Patient Instructions (Signed)
Your physician recommends that you continue on your current medications as directed. Please refer to the Current Medication list given to you today.  Your physician recommends that you return for lab work today (CBC, BMET, [facility] [place] [facility])  Your physician wants you to follow-up in: November, 2018 with Dr. Harrington Challenger.  You will receive a reminder letter in the mail two months in advance. If you don't receive a letter, please call our office to schedule the follow-up appointment.

## 2016-06-13 ENCOUNTER — Other Ambulatory Visit: Payer: Self-pay | Admitting: *Deleted

## 2016-06-13 DIAGNOSIS — I10 Essential (primary) hypertension: Secondary | ICD-10-CM

## 2016-06-13 LAB — BASIC METABOLIC PANEL
BUN/Creatinine Ratio: 20 (ref 10–24)
BUN: 36 mg/dL — ABNORMAL HIGH (ref 8–27)
CHLORIDE: 100 mmol/L (ref 96–106)
CO2: 25 mmol/L (ref 18–29)
Calcium: 9.1 mg/dL (ref 8.6–10.2)
Creatinine, Ser: 1.81 mg/dL — ABNORMAL HIGH (ref 0.76–1.27)
GFR calc Af Amer: 41 mL/min/{1.73_m2} — ABNORMAL LOW (ref >59)
GFR calc non Af Amer: 36 mL/min/{1.73_m2} — ABNORMAL LOW (ref >59)
GLUCOSE: 90 mg/dL (ref 65–99)
POTASSIUM: 5.3 mmol/L — AB (ref 3.5–5.2)
Sodium: 140 mmol/L (ref 134–144)

## 2016-06-13 LAB — CBC
HEMATOCRIT: 36.9 % — AB (ref 37.5–51.0)
Hemoglobin: 12 g/dL — ABNORMAL LOW (ref 13.0–17.7)
MCH: 29.7 pg (ref 26.6–33.0)
MCHC: 32.5 g/dL (ref 31.5–35.7)
MCV: 91 fL (ref 79–97)
PLATELETS: 153 10*3/uL (ref 150–379)
RBC: 4.04 x10E6/uL — ABNORMAL LOW (ref 4.14–5.80)
RDW: 15.9 % — AB (ref 12.3–15.4)
WBC: 6.7 10*3/uL (ref 3.4–10.8)

## 2016-06-13 LAB — HEMOGLOBIN A1C
ESTIMATED AVERAGE GLUCOSE: 117 mg/dL
HEMOGLOBIN A1C: 5.7 % — AB (ref 4.8–5.6)

## 2016-06-13 MED ORDER — FUROSEMIDE 20 MG PO TABS: 40.0000 mg | ORAL_TABLET | ORAL | Status: DC

## 2016-06-13 NOTE — Addendum Note (Signed)
Addended by: Patterson Hammersmith A on: 06/13/2016 08:42 AM   Modules accepted: Orders

## 2016-06-21 ENCOUNTER — Ambulatory Visit (INDEPENDENT_AMBULATORY_CARE_PROVIDER_SITE_OTHER): Payer: Medicare Other | Admitting: *Deleted

## 2016-06-21 DIAGNOSIS — I4891 Unspecified atrial fibrillation: Secondary | ICD-10-CM

## 2016-06-21 LAB — POCT INR: INR: 2.1

## 2016-06-22 ENCOUNTER — Ambulatory Visit: Payer: Medicare Other

## 2016-06-28 ENCOUNTER — Telehealth: Payer: Self-pay | Admitting: *Deleted

## 2016-06-28 ENCOUNTER — Other Ambulatory Visit: Payer: Self-pay | Admitting: Family Medicine

## 2016-06-28 NOTE — Telephone Encounter (Signed)
[facility] pharmacist informed to fill medication. Per PCP, pt has been taking medication for while without side effects.  Derl Barrow, RN

## 2016-06-28 NOTE — Telephone Encounter (Signed)
Received fax from [place] stating patient is allergic to statins? Should Lovastatin be fill or not. Please give them a call at [phone] or send in a new Rx.  Derl Barrow, RN

## 2016-06-28 NOTE — Telephone Encounter (Signed)
Should be filled.  Has been taking this medication for a while without side effects.  [name], MD, MPH PGY-3,  [facility] 06/28/2016 1:58 PM

## 2016-07-04 ENCOUNTER — Other Ambulatory Visit: Payer: Medicare Other | Admitting: *Deleted

## 2016-07-04 DIAGNOSIS — I10 Essential (primary) hypertension: Secondary | ICD-10-CM | POA: Diagnosis not present

## 2016-07-04 LAB — BASIC METABOLIC PANEL
BUN/Creatinine Ratio: 17 (ref 10–24)
BUN: 28 mg/dL — ABNORMAL HIGH (ref 8–27)
CO2: 23 mmol/L (ref 18–29)
CREATININE: 1.63 mg/dL — AB (ref 0.76–1.27)
Calcium: 9.8 mg/dL (ref 8.6–10.2)
Chloride: 100 mmol/L (ref 96–106)
GFR calc Af Amer: 47 mL/min/{1.73_m2} — ABNORMAL LOW (ref >59)
GFR calc non Af Amer: 41 mL/min/{1.73_m2} — ABNORMAL LOW (ref >59)
Glucose: 100 mg/dL — ABNORMAL HIGH (ref 65–99)
POTASSIUM: 5.2 mmol/L (ref 3.5–5.2)
SODIUM: 139 mmol/L (ref 134–144)

## 2016-07-05 ENCOUNTER — Telehealth: Payer: Self-pay | Admitting: *Deleted

## 2016-07-05 DIAGNOSIS — I4819 Other persistent atrial fibrillation: Secondary | ICD-10-CM

## 2016-07-05 MED ORDER — POTASSIUM CHLORIDE CRYS ER 20 MEQ PO TBCR: EXTENDED_RELEASE_TABLET | ORAL | 2 refills | Status: DC

## 2016-07-05 MED ORDER — FUROSEMIDE 20 MG PO TABS: ORAL_TABLET | ORAL | Status: DC

## 2016-07-05 NOTE — Telephone Encounter (Signed)
-----   Message from Dorris Carnes V, MD sent at 07/04/2016 10:18 PM EDT ----- Kidney function is moving toward baseline  Still up a little  I wouldecomm he  back down to lasix to 2x per week with potassium on those days  Check BMET in 1 month Call if gets swelling /notices difference

## 2016-07-05 NOTE — Telephone Encounter (Signed)
Patient aware to decrease Lasix and potassium two times per week.  He will take it Wednesdays and Saturdays.  Will return in one month for BMET.  Aware to call if swelling/SOB.

## 2016-07-11 ENCOUNTER — Encounter: Payer: Self-pay | Admitting: Gastroenterology

## 2016-07-12 ENCOUNTER — Other Ambulatory Visit: Payer: Self-pay | Admitting: Family Medicine

## 2016-07-12 ENCOUNTER — Ambulatory Visit (INDEPENDENT_AMBULATORY_CARE_PROVIDER_SITE_OTHER): Payer: Medicare Other | Admitting: *Deleted

## 2016-07-12 DIAGNOSIS — Z7901 Long term (current) use of anticoagulants: Secondary | ICD-10-CM | POA: Diagnosis present

## 2016-07-12 LAB — POCT INR: INR: 2.4

## 2016-07-18 ENCOUNTER — Telehealth: Payer: Self-pay | Admitting: *Deleted

## 2016-07-18 NOTE — Telephone Encounter (Signed)
Family member called to report that Mr.Huffaker has a new leg ulcer. I tried to call them back multiple times to get more information. I had Ebony Hail reserved a spot on the NP schedule tomorrow at 2:15pm. He had PTFE patch angioplasty of stenosis of left axillobifemoral bypass graft by Dr. Scot Dock on 04-18-16.

## 2016-07-19 ENCOUNTER — Ambulatory Visit (INDEPENDENT_AMBULATORY_CARE_PROVIDER_SITE_OTHER): Payer: Medicare Other | Admitting: Family

## 2016-07-19 ENCOUNTER — Encounter: Payer: Self-pay | Admitting: Family

## 2016-07-19 ENCOUNTER — Ambulatory Visit: Payer: Medicare Other | Admitting: Internal Medicine

## 2016-07-19 VITALS — BP 145/72 | HR 60 | Temp 97.0°F | Resp 18 | Ht 75.0 in | Wt 304.0 lb

## 2016-07-19 DIAGNOSIS — Z89411 Acquired absence of right great toe: Secondary | ICD-10-CM | POA: Diagnosis not present

## 2016-07-19 DIAGNOSIS — I872 Venous insufficiency (chronic) (peripheral): Secondary | ICD-10-CM

## 2016-07-19 DIAGNOSIS — Z9889 Other specified postprocedural states: Secondary | ICD-10-CM

## 2016-07-19 DIAGNOSIS — Z89421 Acquired absence of other right toe(s): Secondary | ICD-10-CM | POA: Diagnosis not present

## 2016-07-19 DIAGNOSIS — I779 Disorder of arteries and arterioles, unspecified: Secondary | ICD-10-CM | POA: Diagnosis not present

## 2016-07-19 DIAGNOSIS — S81811A Laceration without foreign body, right lower leg, initial encounter: Secondary | ICD-10-CM

## 2016-07-19 DIAGNOSIS — Z87891 Personal history of nicotine dependence: Secondary | ICD-10-CM

## 2016-07-19 MED ORDER — CEPHALEXIN 500 MG PO CAPS: 500.0000 mg | ORAL_CAPSULE | Freq: Three times a day (TID) | ORAL | 0 refills | Status: DC
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
## 2016-07-19 NOTE — Patient Instructions (Signed)

## 2016-07-26 ENCOUNTER — Encounter (INDEPENDENT_AMBULATORY_CARE_PROVIDER_SITE_OTHER): Payer: Self-pay | Admitting: Orthopedic Surgery

## 2016-07-26 ENCOUNTER — Ambulatory Visit (INDEPENDENT_AMBULATORY_CARE_PROVIDER_SITE_OTHER): Payer: Medicare Other | Admitting: Orthopedic Surgery

## 2016-07-26 VITALS — Ht 75.0 in | Wt 304.0 lb

## 2016-07-26 DIAGNOSIS — I872 Venous insufficiency (chronic) (peripheral): Secondary | ICD-10-CM | POA: Diagnosis not present

## 2016-07-26 DIAGNOSIS — L97521 Non-pressure chronic ulcer of other part of left foot limited to breakdown of skin: Secondary | ICD-10-CM

## 2016-07-26 DIAGNOSIS — I779 Disorder of arteries and arterioles, unspecified: Secondary | ICD-10-CM
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
## 2016-07-27 ENCOUNTER — Telehealth: Payer: Self-pay | Admitting: Internal Medicine

## 2016-07-27 ENCOUNTER — Encounter: Payer: Self-pay | Admitting: Vascular Surgery

## 2016-07-27 NOTE — Telephone Encounter (Signed)
[name]( Sister) is calling because Mr. Terry Martinez is having swelling in his legs . Dr. Harrington Challenger has told him to cut back on his fluid pill to one pill twice a week . His legs look like they are about to bust. Please call    Thanks

## 2016-07-27 NOTE — Telephone Encounter (Signed)
Spoke with sister, DPR on file.  She states pt was recently changed to Furosemide 40mg  twice weekly.  Since then his legs have swollen terribly and he is retaining a lot of fluid.  Sister states pt is more SOB.  States one of his toes was so swollen that "it busted open".  Pt seen by Dr. Sharol Given yesterday and sister says Dr. Sharol Given said pt's legs looked like they were going to bust.  Pt has been keeping legs elevated with no improvement.  Pt has compression stockings that they plan to start using.  Scheduled pt to see Dr. Harrington Challenger tomorrow at 1:40pm tomorrow.  Sister appreciative for assistance.  Advised I would send message to Dr. Harrington Challenger to make her aware.

## 2016-07-28 ENCOUNTER — Encounter: Payer: Self-pay | Admitting: Physician Assistant

## 2016-07-28 ENCOUNTER — Ambulatory Visit (INDEPENDENT_AMBULATORY_CARE_PROVIDER_SITE_OTHER): Payer: Medicare Other | Admitting: Physician Assistant

## 2016-07-28 ENCOUNTER — Telehealth: Payer: Self-pay | Admitting: *Deleted

## 2016-07-28 ENCOUNTER — Ambulatory Visit (INDEPENDENT_AMBULATORY_CARE_PROVIDER_SITE_OTHER): Payer: Medicare Other | Admitting: Internal Medicine

## 2016-07-28 ENCOUNTER — Encounter: Payer: Self-pay | Admitting: Internal Medicine

## 2016-07-28 VITALS — BP 130/60 | HR 78 | Ht 75.0 in | Wt 311.0 lb

## 2016-07-28 VITALS — BP 130/60 | HR 80 | Ht 75.0 in | Wt 310.0 lb

## 2016-07-28 DIAGNOSIS — Z86010 Personal history of colonic polyps: Secondary | ICD-10-CM

## 2016-07-28 DIAGNOSIS — I5043 Acute on chronic combined systolic (congestive) and diastolic (congestive) heart failure: Secondary | ICD-10-CM

## 2016-07-28 DIAGNOSIS — I2510 Atherosclerotic heart disease of native coronary artery without angina pectoris: Secondary | ICD-10-CM | POA: Diagnosis not present

## 2016-07-28 DIAGNOSIS — E782 Mixed hyperlipidemia: Secondary | ICD-10-CM | POA: Diagnosis not present

## 2016-07-28 DIAGNOSIS — R0602 Shortness of breath: Secondary | ICD-10-CM | POA: Diagnosis not present

## 2016-07-28 DIAGNOSIS — Z1211 Encounter for screening for malignant neoplasm of colon: Secondary | ICD-10-CM | POA: Diagnosis not present

## 2016-07-28 DIAGNOSIS — I4891 Unspecified atrial fibrillation: Secondary | ICD-10-CM

## 2016-07-28 DIAGNOSIS — Z7901 Long term (current) use of anticoagulants: Secondary | ICD-10-CM

## 2016-07-28 DIAGNOSIS — Z860101 Personal history of adenomatous and serrated colon polyps: Secondary | ICD-10-CM | POA: Insufficient documentation

## 2016-07-28 DIAGNOSIS — I779 Disorder of arteries and arterioles, unspecified: Secondary | ICD-10-CM

## 2016-07-28 MED ORDER — FUROSEMIDE 40 MG PO TABS: 40.0000 mg | ORAL_TABLET | Freq: Every day | ORAL | 1 refills | Status: DC

## 2016-07-28 MED ORDER — NA SULFATE-K SULFATE-MG SULF 17.5-3.13-1.6 GM/177ML PO SOLN: 1.0000 | Freq: Once | ORAL | 0 refills | Status: AC
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
## 2016-07-28 NOTE — Patient Instructions (Addendum)
Medication Instructions:  Take lasix 60mg  today, then take lasix 40 mg daily. This will be 1 and 1/2 of a 40 mg tablet today, the 1 of a 40 mg tablet daily.  Labwork: BMET/pro BNP today.  Your physician recommends that you return for lab work on Monday June 11,2018. The lab is open from 7:30 AM-5 PM.   Testing/Procedures: None  Follow-Up: You have  follow-up appointment on Friday June 22,2018 at 12 noon with Dr Harrington Challenger.    Any Other Special Instructions Will Be Listed Below (If Applicable).    Fluid Restriction Some health conditions may require you to restrict your fluid intake. This means that you need to limit the amount of fluid you drink each day. When you have a fluid restriction, you must carefully measure and keep track of the amount of fluid you drink. Your health care provider will identify the specific amount of fluid you are allowed each day. This amount may depend on several things, such as: The amount of urine you produce in a day. How much fluid you are keeping (retaining) in your body. Your blood pressure.  What is my plan? Your health care provider recommends that you limit your fluid intake to 1500 ml (1.5 liters) per day. What counts toward my fluid intake? Your fluid intake includes all liquids that you drink, as well as any foods that become liquid at room temperature. The following are examples of some fluids that you will have to restrict: Tea, coffee, soda, lemonade, milk, water, juice, sport drinks, and nutritional supplement beverages. Alcoholic beverages. Cream. Gravy. Ice cubes. Soup and broth.  The following are examples of foods that become liquid at room temperature. These foods will also count toward your fluid intake. Ice cream and ice milk. Frozen yogurt and sherbet. Frozen ice pops. Flavored gelatin.  How do I keep track of my fluid intake? Each morning, fill a jug with the amount of water that equals the amount of fluid you are allowed  for the day. You can use this water as a guideline for fluid allowance. Each time you take in any form of fluid, including ice cubes and foods that become liquid at room temperature, pour an equal amount of water out of the container. This helps you to see how much fluid you are taking in. It also helps you to see how much of your fluid intake is left for the rest of the day. The following conversions may also be helpful in measuring your fluid intake: 1 cup equals 8 oz (240 mL).  cup equals 6 oz (180 mL). ? cup equals 5? oz (160 mL).  cup equals 4 oz (120 mL). ? cup equals 2? oz (80 mL).  cup equals 2 oz (60 mL). 2 Tbsp equals 1 oz (30 mL).  What home care instructions should I follow while restricting fluids? Make sure that you stay within the recommended limit each day. Always measure and keep track of your fluids, as well as any foods that turn liquid at room temperature. Use small cups and glasses and learn to sip fluids slowly. Add a slice of fresh lemon or lemon juice to water or ice. This helps to satisfy your thirst. Freeze fruit juice or water in an ice cube tray. Use this as part of your fluid allowance. These cubes are useful for quenching your thirst. Measure the amount of liquid in each ice cube prior to freezing so you can subtract this amount from your day's allowance when you  consume each frozen cube. Try frozen fruits between meals, such as grapes or strawberries. Swallow your pills along with meals or soft foods, such as applesauce or mashed potatoes. This helps you to save your fluid allowance for something that you enjoy. Weigh yourself every day. Keeping track of your daily weight can help you and your health care provider to notice as soon as possible if you are retaining too much fluid in your body. Weigh yourself every morning after you urinate but before you eat breakfast. Wear the same amount of clothing each time you weigh yourself. Write down your daily weight.  Give this weight record to your health care provider. If your weight is going up, you may be retaining too much fluid. Every 2 cups (480 mL) of fluid retained in the body becomes an extra 1 lb (0.45 kg) of body weight. Avoid salty foods. These foods make you thirsty and make fluid control more difficult. Brush your teeth often or rinse your mouth with mouthwash to help your dry mouth. Lemon wedges, hard sour candies, chewing gum, or breath spray may also help to moisten your mouth. Keep the temperature in your home at a cooler level. Dry air increases thirst, so keep the air in your home as humid as possible. Avoid being out in the hot sun, which can cause you to sweat and become thirsty. What are some signs that I may be taking in too much fluid? You may be taking in too much fluid if: Your weight increases. Contact your health care provider if your weight increases 3 lb or more in a day or if it increases 5 lb or more in a week. Your face, hands, legs, feet, and belly (abdomen) start to swell. You have trouble breathing.  This information is not intended to replace advice given to you by your health care provider. Make sure you discuss any questions you have with your health care provider. Document Released: 12/04/2006 Document Revised: 07/15/2015 Document Reviewed: 07/08/2013 Elsevier Interactive Patient Education  2018 [place].       Low-Sodium Eating Plan Sodium, which is an element that makes up salt, helps you maintain a healthy balance of fluids in your body. Too much sodium can increase your blood pressure and cause fluid and waste to be held in your body. Your health care provider or dietitian may recommend following this plan if you have high blood pressure (hypertension), kidney disease, liver disease, or heart failure. Eating less sodium can help lower your blood pressure, reduce swelling, and protect your heart, liver, and kidneys. What are tips for following this  plan? General guidelines  Most people on this plan should limit their sodium intake to 1,500-2,000 mg (milligrams) of sodium each day. Reading food labels  The Nutrition Facts label lists the amount of sodium in one serving of the food. If you eat more than one serving, you must multiply the listed amount of sodium by the number of servings.  Choose foods with less than 140 mg of sodium per serving.  Avoid foods with 300 mg of sodium or more per serving. Shopping  Look for lower-sodium products, often labeled as "low-sodium" or "no salt added."  Always check the sodium content even if foods are labeled as "unsalted" or "no salt added".  Buy fresh foods. ? Avoid canned foods and premade or frozen meals. ? Avoid canned, cured, or processed meats  Buy breads that have less than 80 mg of sodium per slice. Cooking  Eat more  home-cooked food and less restaurant, buffet, and fast food.  Avoid adding salt when cooking. Use salt-free seasonings or herbs instead of table salt or sea salt. Check with your health care provider or pharmacist before using salt substitutes.  Cook with plant-based oils, such as canola, sunflower, or olive oil. Meal planning  When eating at a restaurant, ask that your food be prepared with less salt or no salt, if possible.  Avoid foods that contain MSG (monosodium glutamate). MSG is sometimes added to [place] food, bouillon, and some canned foods. What foods are recommended? The items listed may not be a complete list. Talk with your dietitian about what dietary choices are best for you. Grains Low-sodium cereals, including oats, puffed wheat and rice, and shredded wheat. Low-sodium crackers. Unsalted rice. Unsalted pasta. Low-sodium bread. Whole-grain breads and whole-grain pasta. Vegetables Fresh or frozen vegetables. "No salt added" canned vegetables. "No salt added" tomato sauce and paste. Low-sodium or reduced-sodium tomato and vegetable  juice. Fruits Fresh, frozen, or canned fruit. Fruit juice. Meats and other protein foods Fresh or frozen (no salt added) meat, poultry, seafood, and fish. Low-sodium canned tuna and salmon. Unsalted nuts. Dried peas, beans, and lentils without added salt. Unsalted canned beans. Eggs. Unsalted nut butters. Dairy Milk. Soy milk. Cheese that is naturally low in sodium, such as ricotta cheese, fresh mozzarella, or Swiss cheese Low-sodium or reduced-sodium cheese. Cream cheese. Yogurt. Fats and oils Unsalted butter. Unsalted margarine with no trans fat. Vegetable oils such as canola or olive oils. Seasonings and other foods Fresh and dried herbs and spices. Salt-free seasonings. Low-sodium mustard and ketchup. Sodium-free salad dressing. Sodium-free light mayonnaise. Fresh or refrigerated horseradish. Lemon juice. Vinegar. Homemade, reduced-sodium, or low-sodium soups. Unsalted popcorn and pretzels. Low-salt or salt-free chips. What foods are not recommended? The items listed may not be a complete list. Talk with your dietitian about what dietary choices are best for you. Grains Instant hot cereals. Bread stuffing, pancake, and biscuit mixes. Croutons. Seasoned rice or pasta mixes. Noodle soup cups. Boxed or frozen macaroni and cheese. Regular salted crackers. Self-rising flour. Vegetables Sauerkraut, pickled vegetables, and relishes. Olives. [place] fries. Onion rings. Regular canned vegetables (not low-sodium or reduced-sodium). Regular canned tomato sauce and paste (not low-sodium or reduced-sodium). Regular tomato and vegetable juice (not low-sodium or reduced-sodium). Frozen vegetables in sauces. Meats and other protein foods Meat or fish that is salted, canned, smoked, spiced, or pickled. Bacon, ham, sausage, hotdogs, corned beef, chipped beef, packaged lunch meats, salt pork, jerky, pickled herring, anchovies, regular canned tuna, sardines, salted nuts. Dairy Processed cheese and cheese spreads.  Cheese curds. Blue cheese. Feta cheese. String cheese. Regular cottage cheese. Buttermilk. Canned milk. Fats and oils Salted butter. Regular margarine. Ghee. Bacon fat. Seasonings and other foods Onion salt, garlic salt, seasoned salt, table salt, and sea salt. Canned and packaged gravies. Worcestershire sauce. Tartar sauce. Barbecue sauce. Teriyaki sauce. Soy sauce, including reduced-sodium. Steak sauce. Fish sauce. Oyster sauce. Cocktail sauce. Horseradish that you find on the shelf. Regular ketchup and mustard. Meat flavorings and tenderizers. Bouillon cubes. Hot sauce and Tabasco sauce. Premade or packaged marinades. Premade or packaged taco seasonings. Relishes. Regular salad dressings. Salsa. Potato and tortilla chips. Corn chips and puffs. Salted popcorn and pretzels. Canned or dried soups. Pizza. Frozen entrees and pot pies. Summary  Eating less sodium can help lower your blood pressure, reduce swelling, and protect your heart, liver, and kidneys.  Most people on this plan should limit their sodium intake to 1,500-2,000 mg (  milligrams) of sodium each day.  Canned, boxed, and frozen foods are high in sodium. Restaurant foods, fast foods, and pizza are also very high in sodium. You also get sodium by adding salt to food.  Try to cook at home, eat more fresh fruits and vegetables, and eat less fast food, canned, processed, or prepared foods. This information is not intended to replace advice given to you by your health care provider. Make sure you discuss any questions you have with your health care provider. Document Released: 07/29/2001 Document Revised: 01/31/2016 Document Reviewed: 01/31/2016 Elsevier Interactive Patient Education  2017 [employer].    If you need a refill on your cardiac medications before your next appointment, please call your pharmacy.

## 2016-07-28 NOTE — Progress Notes (Signed)
Subjective:    Patient ID: Terry Martinez, male    DOB: 21-Apr-1940, 76 y.o.   MRN: [id number]  HPI Terry Martinez is a pleasant 76 year old white male, known to Dr. Fuller Plan who comes in today to discuss recall colonoscopy. He was last seen here in 2015 when he had colonoscopy which showed 2 sessile polyps measuring 5-7 cm at the cecum and a pedunculated 7 mm polyp in the transverse colon sessile polyp 6 mm in the rectum. All were removed, and found to be tubular adenomas and a sessile serrated polyp He was recommended for 3 year interval follow-up. Patient has history of atrial fibrillation, is on chronic Coumadin, peripheral arterial 2 disease status post left axillobifemoral bypass with recent revision done 04/18/2016. Also with adult-onset diabetes mellitus, congestive heart failure with EF of 45-50% and morbid obesity with BMI of 38.  Patient says he has not been feeling well over the past few weeks. He has had some worsening of his kidney function and his Lasix dosage had been cut back significantly. He is started gaining weight and his had an increase in exertional dyspnea with any kind of movement. He has gained about 15 pounds over the past month. He has appointment to see Dr. Dorris Carnes later today He currently has no GI complaints, specifically no problems with abdominal pain, changes in bowel habits melena or hematochezia.  Review of Systems Pertinent positive and negative review of systems were noted in the above HPI section.  All other review of systems was otherwise negative.  Outpatient Encounter Prescriptions as of 07/28/2016  Medication Sig  . acetaminophen (TYLENOL) 500 MG tablet Take 500-1,000 mg by mouth every 6 (six) hours as needed for moderate pain or headache.  . albuterol (PROVENTIL HFA;VENTOLIN HFA) 108 (90 BASE) MCG/ACT inhaler Inhale 2 puffs into the lungs every 6 (six) hours as needed for wheezing or shortness of breath.  . allopurinol (ZYLOPRIM) 100 MG tablet TAKE ONE TABLET BY  MOUTH ONCE DAILY IN THE EVENING  . allopurinol (ZYLOPRIM) 300 MG tablet TAKE ONE TABLET BY MOUTH ONCE DAILY IN THE MORNING  . Ascorbic Acid (VITAMIN C PO) Take 1 tablet by mouth daily.  . Carboxymethylcellul-Glycerin (LUBRICATING EYE DROPS OP) Apply 1 drop to eye daily as needed (dry eyes).  . cephALEXin (KEFLEX) 500 MG capsule Take 1 capsule (500 mg total) by mouth 3 (three) times daily.  . Cholecalciferol (VITAMIN D-3 PO) Take 1 tablet by mouth daily.  . enalapril (VASOTEC) 10 MG tablet TAKE ONE TABLET BY MOUTH ONCE DAILY  . EPINEPHrine (EPI-PEN) 0.3 mg/0.3 mL SOAJ injection Inject 0.3 mLs (0.3 mg total) into the muscle once. (Patient taking differently: Inject 0.3 mg into the muscle once as needed (For anaphylaxis.). )  . furosemide (LASIX) 20 MG tablet Take 2 tablets every Wednesday and Saturday (two times per week)  . furosemide (LASIX) 20 MG tablet TAKE 2 TABLETS BY MOUTH ONCE DAILY  . lovastatin (MEVACOR) 20 MG tablet TAKE ONE TABLET BY MOUTH AT BEDTIME  . metoprolol tartrate (LOPRESSOR) 25 MG tablet Take 0.5 tablets (12.5 mg total) by mouth 2 (two) times daily.  . metroNIDAZOLE (METROGEL) 1 % gel Apply topically daily. For rosacea (Patient taking differently: Apply 1 application topically daily as needed (rosacea). )  . nitroGLYCERIN (NITROSTAT) 0.4 MG SL tablet Place 1 tablet (0.4 mg total) under the tongue every 5 (five) minutes as needed for chest pain.  . polyethylene glycol powder (GLYCOLAX/MIRALAX) powder DISSOLVE 17G (1 CAPFUL) OF POWDER IN 8 OUNCES  OF LIQUID AND DRINK 2 TIMES PER DAY AS NEEDED FOR MILD CONSTIPATION  . potassium chloride SA (KLOR-CON M20) 20 MEQ tablet Take one tablet by mouth two times per week .  (take with furosemide)  . warfarin (COUMADIN) 5 MG tablet Take 1 tablet (5 mg total) by mouth daily. Or as directed (Patient taking differently: Take 2.5-5 mg by mouth every evening. Take half a tablet (2.'5mg'$ ) on Monday and Friday and one tablet ('5mg'$ ) daily the rest of the  week.)  . Na Sulfate-K Sulfate-Mg Sulf 17.5-3.13-1.6 GM/180ML SOLN Take 1 kit by mouth once.   No facility-administered encounter medications on file as of 07/28/2016.    Allergies  Allergen Reactions  . Zocor [Simvastatin] Hives and Rash   Patient Active Problem List   Diagnosis Date Noted  . H/O amputation of lesser toe, right ([place]) 06/08/2016  . PVD (peripheral vascular disease) ([place]) 04/17/2016  . Acquired absence of right great toe ([place]) 03/02/2016  . 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 Venous stasis dermatitis of both lower extremities 01/24/2016  . Incarcerated incisional hernia s/p lap LOA & repair with mesh 08/11/2015 08/11/2015  . S/P Left axillobifemoral bypass graft 08/11/2015  . Obesity 10/05/2014  . Ulcer of toe of left foot, limited to breakdown of skin ([place]) 01/30/2014  . Hx of basal cell carcinoma 08/27/2013  . Cough 02/06/2013  . PAD (peripheral artery disease) ([place]) 12/05/2012  . HTN (hypertension) 12/05/2012  . Atrial fibrillation ([place]) 12/05/2012  . HLD (hyperlipidemia) 12/05/2012  . CAD (coronary artery disease) 12/05/2012  . Gout 12/05/2012  . Preventative health care 12/05/2012  . Long term current use of anticoagulant therapy 09/04/2012  . Abnormal prostate specific antigen 05/31/2011   Social History   Social History  . Marital status: Widowed    Spouse name: N/A  . Number of children: N/A  . Years of education: N/A   Occupational History  . Not on file.   Social History Main Topics  . Smoking status: Former Smoker    Packs/day: 2.50    Years: 46.00    Types: Cigarettes    Quit date: 07/22/1998  . Smokeless tobacco: Current User    Types: Snuff  . Alcohol use No  . Drug use: No  . Sexual activity: No   Other Topics Concern  . Not on file   Social History Narrative  . No narrative on file    Mr. Lovan's family history includes Alcohol abuse in his sister; Cancer in his mother; Depression in his brother; Diabetes in his  mother; Early death in his brother; Emphysema in his maternal grandfather; Heart disease in his brother, maternal grandfather, maternal grandmother, mother, and sister; Hyperlipidemia in his brother, mother, and sister; Hypertension in his brother, mother, and sister; Kidney disease in his maternal grandfather and maternal grandmother; Liver cancer in his brother; Lung cancer in his sister; Lymphoma in his father and mother; Stroke in his sister.      Objective:    Vitals:   07/28/16 1015  BP: 130/60  Pulse: 80    Physical Exam  well-developed elderly white male in no acute distress, pleasant accompanied by his sister blood pressure 130/60 pulse 80, Height 6 foot 3, weight 310, BMI 38.7. HEENT ;nontraumatic normocephalic EOMI PERRLA sclera anicteric, Cardiovascular; regular rate and rhythm with S1-S2, Pulmonary ;with decreased breath sounds bilaterally, Abdomen ;obese soft nontender nondistended bowel sounds are present no palpable mass or hepatosplenomegaly, Rectal ;exam not done, Extremities ;he has chronic stasis changes  bilaterally in his lower extremities, there is a small wound on the right shin which is bandaged there's tight brawny edema to the knees, Neuropsych ;mood and affect appropriate       Assessment & Plan:   #18 76 year old white male with history of adenomatous and sessile serrated polyps, who is due for follow-up colonoscopy this summer. Currently asymptomatic #2 chronic anticoagulation-on Coumadin #3 atrial fibrillation #4 peripheral arterial disease status post left axillobifemoral bypass and recent revision February 2018 #5 adult-onset diabetes mellitus #6 congestive heart failure with EF 45-50% #7 morbid obesity with BMI of 38 #8 recent worsening of kidney function, with secondary volume overload due to decrease in diuretics. Patient has gained 15 pounds over the past month and is complaining of exertional dyspnea and peripheral edema.  Plan; Discussed options with  patient and his sister. Patient should not have colonoscopy now until his cardiopulmonary status is maximized. We will schedule for colonoscopy with Dr. Fuller Plan, but intentionally scheduled for August 2018 to allow adequate time for management of his congestive heart failure. Patient and sister strongly advised to call and cancel colonoscopy should he continued to have issues with volume overload over the next month or so. They voiced understanding. Patient will need to hold Coumadin for 5 days prior to colonoscopy. We will communicate with the patient's cardiologist Dr. Dorris Carnes to assure that this is reasonable for this patient.   Athol Bolds S Shelisa Fern PA-Csecondary to 07/28/2016   Cc: [name], MD

## 2016-07-28 NOTE — Telephone Encounter (Signed)
07/28/2016   RE: Terry Martinez DOB: 1940-08-07 MRN: [id number]   Dear Dr. Dorris Carnes,    We have scheduled the above patient for an endoscopic procedure. Our records show that he is on anticoagulation therapy.   Please advise as to how long the patient may come off his therapy of Coumadin prior to the procedure, which is scheduled for 10-12-2016.  Please  route the Coumadin clearance instructions to [facility] CMA.   Sincerely,    Amy Esterwood PA-C

## 2016-07-28 NOTE — Patient Instructions (Addendum)
You have been scheduled for a colonoscopy. Please follow written instructions given to you at your visit today.  Please pick up your prep supplies at the pharmacy within the next 1-3 days.[address] [employer].    If you use inhalers (even only as needed), please bring them with you on the day of your procedure. Your physician has requested that you go to www.startemmi.com and enter the access code given to you at your visit today. This web site gives a general overview about your procedure. However, you should still follow specific instructions given to you by our office regarding your preparation for the procedure.  If you are age 27 or older, your body mass index should be between 23-30. Your Body mass index is 38.75 kg/m. If this is out of the aforementioned range listed, please consider follow up with your Primary Care Provider.  If you are age 53 or younger, your body mass index should be between 19-25. Your Body mass index is 38.75 kg/m. If this is out of the aformentioned range listed, please consider follow up with your Primary Care Provider.

## 2016-07-29 LAB — BASIC METABOLIC PANEL
BUN / CREAT RATIO: 14 (ref 10–24)
BUN: 19 mg/dL (ref 8–27)
CO2: 22 mmol/L (ref 18–29)
CREATININE: 1.4 mg/dL — AB (ref 0.76–1.27)
Calcium: 8.8 mg/dL (ref 8.6–10.2)
Chloride: 106 mmol/L (ref 96–106)
GFR calc Af Amer: 56 mL/min/{1.73_m2} — ABNORMAL LOW (ref >59)
GFR, EST NON AFRICAN AMERICAN: 49 mL/min/{1.73_m2} — AB (ref >59)
Glucose: 99 mg/dL (ref 65–99)
Potassium: 5 mmol/L (ref 3.5–5.2)
SODIUM: 143 mmol/L (ref 134–144)

## 2016-07-29 LAB — PRO B NATRIURETIC PEPTIDE: NT-Pro BNP: 5894 pg/mL — ABNORMAL HIGH (ref 0–486)

## 2016-07-29 NOTE — Telephone Encounter (Signed)
Pt is OK to come of coumadin 5 days prior to procedure  Low risk for event.

## 2016-07-30 NOTE — Progress Notes (Signed)
Reviewed and agree with management plan.  Cathern Tahir T. Federico Maiorino, MD FACG 

## 2016-07-31 ENCOUNTER — Other Ambulatory Visit: Payer: Medicare Other | Admitting: *Deleted

## 2016-07-31 DIAGNOSIS — I2510 Atherosclerotic heart disease of native coronary artery without angina pectoris: Secondary | ICD-10-CM

## 2016-07-31 DIAGNOSIS — R0602 Shortness of breath: Secondary | ICD-10-CM | POA: Diagnosis not present

## 2016-07-31 DIAGNOSIS — I4891 Unspecified atrial fibrillation: Secondary | ICD-10-CM | POA: Diagnosis not present

## 2016-07-31 LAB — BASIC METABOLIC PANEL
BUN / CREAT RATIO: 12 (ref 10–24)
BUN: 20 mg/dL (ref 8–27)
CO2: 25 mmol/L (ref 20–29)
Calcium: 9.4 mg/dL (ref 8.6–10.2)
Chloride: 99 mmol/L (ref 96–106)
Creatinine, Ser: 1.64 mg/dL — ABNORMAL HIGH (ref 0.76–1.27)
GFR calc non Af Amer: 40 mL/min/{1.73_m2} — ABNORMAL LOW (ref >59)
GFR, EST AFRICAN AMERICAN: 47 mL/min/{1.73_m2} — AB (ref >59)
GLUCOSE: 112 mg/dL — AB (ref 65–99)
POTASSIUM: 4.8 mmol/L (ref 3.5–5.2)
SODIUM: 138 mmol/L (ref 134–144)

## 2016-07-31 LAB — PRO B NATRIURETIC PEPTIDE: NT-PRO BNP: 5365 pg/mL — AB (ref 0–486)

## 2016-08-01 DIAGNOSIS — D485 Neoplasm of uncertain behavior of skin: Secondary | ICD-10-CM | POA: Diagnosis not present

## 2016-08-01 DIAGNOSIS — C44319 Basal cell carcinoma of skin of other parts of face: Secondary | ICD-10-CM | POA: Diagnosis not present

## 2016-08-01 DIAGNOSIS — L570 Actinic keratosis: Secondary | ICD-10-CM | POA: Diagnosis not present

## 2016-08-02 ENCOUNTER — Other Ambulatory Visit: Payer: Self-pay | Admitting: *Deleted

## 2016-08-02 ENCOUNTER — Encounter: Payer: Self-pay | Admitting: Family

## 2016-08-02 ENCOUNTER — Ambulatory Visit (INDEPENDENT_AMBULATORY_CARE_PROVIDER_SITE_OTHER): Payer: Medicare Other | Admitting: Family

## 2016-08-02 VITALS — BP 134/64 | HR 56 | Temp 97.0°F | Resp 18 | Ht 75.0 in | Wt 295.0 lb

## 2016-08-02 DIAGNOSIS — Z89421 Acquired absence of other right toe(s): Secondary | ICD-10-CM | POA: Diagnosis not present

## 2016-08-02 DIAGNOSIS — I872 Venous insufficiency (chronic) (peripheral): Secondary | ICD-10-CM

## 2016-08-02 DIAGNOSIS — R609 Edema, unspecified: Secondary | ICD-10-CM

## 2016-08-02 DIAGNOSIS — Z89411 Acquired absence of right great toe: Secondary | ICD-10-CM

## 2016-08-02 DIAGNOSIS — S81811A Laceration without foreign body, right lower leg, initial encounter: Secondary | ICD-10-CM

## 2016-08-02 DIAGNOSIS — I4891 Unspecified atrial fibrillation: Secondary | ICD-10-CM

## 2016-08-02 DIAGNOSIS — I779 Disorder of arteries and arterioles, unspecified: Secondary | ICD-10-CM | POA: Diagnosis not present

## 2016-08-02 DIAGNOSIS — Z87891 Personal history of nicotine dependence: Secondary | ICD-10-CM

## 2016-08-02 DIAGNOSIS — Z9889 Other specified postprocedural states: Secondary | ICD-10-CM

## 2016-08-02 NOTE — Patient Instructions (Signed)
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
## 2016-08-04 ENCOUNTER — Other Ambulatory Visit: Payer: Medicare Other

## 2016-08-04 ENCOUNTER — Other Ambulatory Visit: Payer: Self-pay

## 2016-08-04 DIAGNOSIS — R0602 Shortness of breath: Secondary | ICD-10-CM | POA: Diagnosis not present

## 2016-08-04 DIAGNOSIS — R609 Edema, unspecified: Secondary | ICD-10-CM | POA: Diagnosis not present

## 2016-08-04 DIAGNOSIS — I4891 Unspecified atrial fibrillation: Secondary | ICD-10-CM

## 2016-08-04 LAB — BASIC METABOLIC PANEL
BUN / CREAT RATIO: 15 (ref 10–24)
BUN: 26 mg/dL (ref 8–27)
CO2: 28 mmol/L (ref 20–29)
Calcium: 9.8 mg/dL (ref 8.6–10.2)
Chloride: 104 mmol/L (ref 96–106)
Creatinine, Ser: 1.72 mg/dL — ABNORMAL HIGH (ref 0.76–1.27)
GFR calc Af Amer: 44 mL/min/{1.73_m2} — ABNORMAL LOW (ref >59)
GFR, EST NON AFRICAN AMERICAN: 38 mL/min/{1.73_m2} — AB (ref >59)
GLUCOSE: 120 mg/dL — AB (ref 65–99)
POTASSIUM: 4.6 mmol/L (ref 3.5–5.2)
Sodium: 137 mmol/L (ref 134–144)

## 2016-08-04 LAB — PRO B NATRIURETIC PEPTIDE: NT-PRO BNP: 4660 pg/mL — AB (ref 0–486)

## 2016-08-07 DIAGNOSIS — C4361 Malignant melanoma of right upper limb, including shoulder: Secondary | ICD-10-CM | POA: Diagnosis not present

## 2016-08-08 ENCOUNTER — Ambulatory Visit (INDEPENDENT_AMBULATORY_CARE_PROVIDER_SITE_OTHER): Payer: Medicare Other | Admitting: *Deleted

## 2016-08-08 ENCOUNTER — Other Ambulatory Visit: Payer: Medicare Other

## 2016-08-08 ENCOUNTER — Other Ambulatory Visit: Payer: Medicare Other | Admitting: *Deleted

## 2016-08-08 DIAGNOSIS — Z7901 Long term (current) use of anticoagulants: Secondary | ICD-10-CM

## 2016-08-08 DIAGNOSIS — R609 Edema, unspecified: Secondary | ICD-10-CM

## 2016-08-08 DIAGNOSIS — I4891 Unspecified atrial fibrillation: Secondary | ICD-10-CM | POA: Diagnosis not present

## 2016-08-08 LAB — BASIC METABOLIC PANEL
BUN/Creatinine Ratio: 18 (ref 10–24)
BUN: 28 mg/dL — ABNORMAL HIGH (ref 8–27)
CO2: 22 mmol/L (ref 20–29)
Calcium: 9.4 mg/dL (ref 8.6–10.2)
Chloride: 100 mmol/L (ref 96–106)
Creatinine, Ser: 1.59 mg/dL — ABNORMAL HIGH (ref 0.76–1.27)
GFR, EST AFRICAN AMERICAN: 48 mL/min/{1.73_m2} — AB (ref >59)
GFR, EST NON AFRICAN AMERICAN: 42 mL/min/{1.73_m2} — AB (ref >59)
Glucose: 103 mg/dL — ABNORMAL HIGH (ref 65–99)
POTASSIUM: 4.7 mmol/L (ref 3.5–5.2)
SODIUM: 138 mmol/L (ref 134–144)

## 2016-08-08 LAB — PRO B NATRIURETIC PEPTIDE: NT-Pro BNP: 3424 pg/mL — ABNORMAL HIGH (ref 0–486)

## 2016-08-08 LAB — POCT INR: INR: 1.5

## 2016-08-08 NOTE — Telephone Encounter (Signed)
I called and advised the patient's sister that the patient can hold his Coumadin on 10-07-2016 through and including the 23rd.  He can resume the coumadin on 10-13-2016.  She verbalized understanding the instructions and will let Sanju[facility] know.

## 2016-08-11 ENCOUNTER — Ambulatory Visit: Payer: Medicare Other | Admitting: Internal Medicine

## 2016-08-11 ENCOUNTER — Ambulatory Visit: Payer: Medicare Other

## 2016-08-14 DIAGNOSIS — C4361 Malignant melanoma of right upper limb, including shoulder: Secondary | ICD-10-CM | POA: Diagnosis not present

## 2016-08-14 DIAGNOSIS — L570 Actinic keratosis: Secondary | ICD-10-CM | POA: Diagnosis not present

## 2016-08-16 ENCOUNTER — Encounter (INDEPENDENT_AMBULATORY_CARE_PROVIDER_SITE_OTHER): Payer: Self-pay | Admitting: Orthopedic Surgery

## 2016-08-16 ENCOUNTER — Ambulatory Visit (INDEPENDENT_AMBULATORY_CARE_PROVIDER_SITE_OTHER): Payer: Medicare Other | Admitting: Orthopedic Surgery

## 2016-08-16 VITALS — Ht 75.0 in | Wt 295.0 lb

## 2016-08-16 DIAGNOSIS — L97521 Non-pressure chronic ulcer of other part of left foot limited to breakdown of skin: Secondary | ICD-10-CM

## 2016-08-16 DIAGNOSIS — I779 Disorder of arteries and arterioles, unspecified: Secondary | ICD-10-CM
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
## 2016-08-17 ENCOUNTER — Telehealth: Payer: Self-pay | Admitting: Pharmacist

## 2016-08-17 DIAGNOSIS — D0361 Melanoma in situ of right upper limb, including shoulder: Secondary | ICD-10-CM | POA: Diagnosis not present

## 2016-08-17 DIAGNOSIS — C4361 Malignant melanoma of right upper limb, including shoulder: Secondary | ICD-10-CM | POA: Diagnosis not present

## 2016-08-17 NOTE — Telephone Encounter (Signed)
Patients sister called back and states patient is resting after having some melanoma removed yesterday.  She states patient is interested in potential switch to a DOAC from warfarin.  Patient has been scheduled for followup with pharmacy in July.  Bennye Alm, PharmD, [place] PGY2 Pharmacy Resident [phone]

## 2016-08-17 NOTE — Telephone Encounter (Signed)
08/17/16  Patient currently on Warfarin for atrial fibrillation.  Patient is candidate for transition to [place].  Called patient to discuss and schedule followup with pharmacy but was unable to reach him.  Left HIPAA compliant voicemail asking him to return my call.  Bennye Alm, PharmD, [place] PGY2 Pharmacy Resident [phone]

## 2016-08-18 ENCOUNTER — Ambulatory Visit (INDEPENDENT_AMBULATORY_CARE_PROVIDER_SITE_OTHER): Payer: Medicare Other | Admitting: Internal Medicine

## 2016-08-18 VITALS — BP 132/68 | HR 82 | Ht 75.0 in | Wt 288.0 lb

## 2016-08-18 DIAGNOSIS — I779 Disorder of arteries and arterioles, unspecified: Secondary | ICD-10-CM | POA: Diagnosis not present

## 2016-08-18 DIAGNOSIS — I712 Thoracic aortic aneurysm, without rupture, unspecified: Secondary | ICD-10-CM

## 2016-08-18 DIAGNOSIS — I5043 Acute on chronic combined systolic (congestive) and diastolic (congestive) heart failure: Secondary | ICD-10-CM | POA: Diagnosis not present

## 2016-08-18 DIAGNOSIS — Z79899 Other long term (current) drug therapy: Secondary | ICD-10-CM | POA: Diagnosis not present

## 2016-08-18 DIAGNOSIS — R0602 Shortness of breath: Secondary | ICD-10-CM

## 2016-08-18 DIAGNOSIS — I10 Essential (primary) hypertension: Secondary | ICD-10-CM | POA: Diagnosis not present

## 2016-08-18 NOTE — Patient Instructions (Signed)
Medication Instructions:  The current medical regimen is effective;  continue present plan and medications.  Labwork: Please have blood work today  Follow-Up: Follow up in 2 months with Dr Harrington Challenger.  If you need a refill on your cardiac medications before your next appointment, please call your pharmacy.  Thank you for choosing [facility]!!
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
## 2016-08-19 LAB — BASIC METABOLIC PANEL
BUN / CREAT RATIO: 19 (ref 10–24)
BUN: 37 mg/dL — AB (ref 8–27)
CALCIUM: 9.8 mg/dL (ref 8.6–10.2)
CHLORIDE: 101 mmol/L (ref 96–106)
CO2: 27 mmol/L (ref 20–29)
Creatinine, Ser: 1.95 mg/dL — ABNORMAL HIGH (ref 0.76–1.27)
GFR calc Af Amer: 38 mL/min/{1.73_m2} — ABNORMAL LOW (ref >59)
GFR calc non Af Amer: 33 mL/min/{1.73_m2} — ABNORMAL LOW (ref >59)
GLUCOSE: 110 mg/dL — AB (ref 65–99)
POTASSIUM: 5.5 mmol/L — AB (ref 3.5–5.2)
Sodium: 142 mmol/L (ref 134–144)

## 2016-08-19 LAB — PRO B NATRIURETIC PEPTIDE: NT-PRO BNP: 3113 pg/mL — AB (ref 0–486)

## 2016-08-21 ENCOUNTER — Other Ambulatory Visit: Payer: Self-pay | Admitting: Family Medicine

## 2016-08-22 ENCOUNTER — Ambulatory Visit: Payer: Medicare Other

## 2016-08-22 ENCOUNTER — Other Ambulatory Visit: Payer: Self-pay | Admitting: *Deleted

## 2016-08-22 DIAGNOSIS — E875 Hyperkalemia: Secondary | ICD-10-CM

## 2016-08-22 DIAGNOSIS — C4361 Malignant melanoma of right upper limb, including shoulder: Secondary | ICD-10-CM | POA: Diagnosis not present

## 2016-08-24 ENCOUNTER — Telehealth: Payer: Self-pay

## 2016-08-24 DIAGNOSIS — I10 Essential (primary) hypertension: Secondary | ICD-10-CM

## 2016-08-24 NOTE — Telephone Encounter (Signed)
Confirmed with patient he understood Dr. Harrington Challenger' instructions. He understands to have labs drawn tomorrow at his local [facility]. He states if he cannot find the [facility] he will come to the office instead. Patient was grateful for call.

## 2016-08-24 NOTE — Telephone Encounter (Signed)
-----   Message from Dorris Carnes V, MD sent at 08/22/2016 12:00 PM EDT ----- Spoke to patient's wife She said if he doesn't take lasix he would bloat up fast Recomm hold lasix today   Continue  [facility]  Please mail slip so he can get this done

## 2016-08-25 ENCOUNTER — Other Ambulatory Visit: Payer: Medicare Other | Admitting: *Deleted

## 2016-08-25 DIAGNOSIS — I10 Essential (primary) hypertension: Secondary | ICD-10-CM | POA: Diagnosis not present

## 2016-08-25 LAB — BASIC METABOLIC PANEL
BUN / CREAT RATIO: 20 (ref 10–24)
BUN: 30 mg/dL — ABNORMAL HIGH (ref 8–27)
CALCIUM: 9.1 mg/dL (ref 8.6–10.2)
CHLORIDE: 101 mmol/L (ref 96–106)
CO2: 23 mmol/L (ref 20–29)
Creatinine, Ser: 1.51 mg/dL — ABNORMAL HIGH (ref 0.76–1.27)
GFR, EST AFRICAN AMERICAN: 51 mL/min/{1.73_m2} — AB (ref >59)
GFR, EST NON AFRICAN AMERICAN: 45 mL/min/{1.73_m2} — AB (ref >59)
Glucose: 130 mg/dL — ABNORMAL HIGH (ref 65–99)
POTASSIUM: 5 mmol/L (ref 3.5–5.2)
SODIUM: 141 mmol/L (ref 134–144)

## 2016-08-25 NOTE — Addendum Note (Signed)
Addended by: Harland German A on: 08/25/2016 09:48 AM   Modules accepted: Orders

## 2016-08-28 ENCOUNTER — Telehealth: Payer: Self-pay | Admitting: *Deleted

## 2016-08-28 DIAGNOSIS — I5043 Acute on chronic combined systolic (congestive) and diastolic (congestive) heart failure: Secondary | ICD-10-CM

## 2016-08-28 NOTE — Telephone Encounter (Signed)
Notes recorded by Fay Records, MD on 08/26/2016 at 10:01 AM EDT SPoke to pt's sister. Kidney function is improved Back toward baseline  Keep on same regimen  Set up for BMET in 3 wks

## 2016-08-29 ENCOUNTER — Ambulatory Visit (INDEPENDENT_AMBULATORY_CARE_PROVIDER_SITE_OTHER): Payer: Medicare Other | Admitting: *Deleted

## 2016-08-29 DIAGNOSIS — Z7901 Long term (current) use of anticoagulants: Secondary | ICD-10-CM

## 2016-08-29 LAB — POCT INR: INR: 2.3

## 2016-08-30 ENCOUNTER — Ambulatory Visit (INDEPENDENT_AMBULATORY_CARE_PROVIDER_SITE_OTHER): Payer: Medicare Other | Admitting: Orthopedic Surgery

## 2016-09-07 ENCOUNTER — Ambulatory Visit (INDEPENDENT_AMBULATORY_CARE_PROVIDER_SITE_OTHER): Payer: Medicare Other

## 2016-09-07 ENCOUNTER — Ambulatory Visit (INDEPENDENT_AMBULATORY_CARE_PROVIDER_SITE_OTHER): Payer: Medicare Other | Admitting: Orthopedic Surgery

## 2016-09-07 ENCOUNTER — Ambulatory Visit (INDEPENDENT_AMBULATORY_CARE_PROVIDER_SITE_OTHER): Payer: Medicare Other | Admitting: Pharmacist

## 2016-09-07 ENCOUNTER — Encounter: Payer: Self-pay | Admitting: Pharmacist

## 2016-09-07 ENCOUNTER — Ambulatory Visit (INDEPENDENT_AMBULATORY_CARE_PROVIDER_SITE_OTHER): Payer: Medicare Other | Admitting: Family

## 2016-09-07 ENCOUNTER — Encounter (INDEPENDENT_AMBULATORY_CARE_PROVIDER_SITE_OTHER): Payer: Self-pay | Admitting: Family

## 2016-09-07 ENCOUNTER — Other Ambulatory Visit: Payer: Medicare Other | Admitting: *Deleted

## 2016-09-07 DIAGNOSIS — Z7901 Long term (current) use of anticoagulants: Secondary | ICD-10-CM

## 2016-09-07 DIAGNOSIS — I779 Disorder of arteries and arterioles, unspecified: Secondary | ICD-10-CM

## 2016-09-07 DIAGNOSIS — L97521 Non-pressure chronic ulcer of other part of left foot limited to breakdown of skin: Secondary | ICD-10-CM | POA: Diagnosis not present

## 2016-09-07 DIAGNOSIS — I739 Peripheral vascular disease, unspecified: Secondary | ICD-10-CM

## 2016-09-07 DIAGNOSIS — L97511 Non-pressure chronic ulcer of other part of right foot limited to breakdown of skin: Secondary | ICD-10-CM

## 2016-09-07 DIAGNOSIS — I482 Chronic atrial fibrillation: Secondary | ICD-10-CM

## 2016-09-07 DIAGNOSIS — I4821 Permanent atrial fibrillation: Secondary | ICD-10-CM

## 2016-09-07 DIAGNOSIS — I5043 Acute on chronic combined systolic (congestive) and diastolic (congestive) heart failure: Secondary | ICD-10-CM | POA: Diagnosis not present

## 2016-09-07 LAB — BASIC METABOLIC PANEL
BUN/Creatinine Ratio: 20 (ref 10–24)
BUN: 37 mg/dL — ABNORMAL HIGH (ref 8–27)
CALCIUM: 9.2 mg/dL (ref 8.6–10.2)
CO2: 24 mmol/L (ref 20–29)
Chloride: 99 mmol/L (ref 96–106)
Creatinine, Ser: 1.81 mg/dL — ABNORMAL HIGH (ref 0.76–1.27)
GFR calc Af Amer: 41 mL/min/{1.73_m2} — ABNORMAL LOW (ref >59)
GFR calc non Af Amer: 36 mL/min/{1.73_m2} — ABNORMAL LOW (ref >59)
GLUCOSE: 80 mg/dL (ref 65–99)
POTASSIUM: 5.3 mmol/L — AB (ref 3.5–5.2)
SODIUM: 139 mmol/L (ref 134–144)

## 2016-09-07 LAB — POCT INR: INR: 2.8

## 2016-09-07 MED ORDER — RIVAROXABAN 15 MG PO TABS: 15.0000 mg | ORAL_TABLET | Freq: Every day | ORAL | 0 refills | Status: DC
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
## 2016-09-07 NOTE — Assessment & Plan Note (Signed)
Longstanding anticoagulation with warfarin in a patient willing to be switched to Xarelto (rivaroxaban) following discussion of advantages and disadvantages.   Patient was provided education on lack of required monitoring which is challenging as he drives > 30 minutes one way to visits with this sister.  We evaluated INR in office today.   Plan Warfarin 5mg  tonight and if blood work returns with similar renal function as in the past we will initiate 15mg  daily. New Rx sent.   Patient will await call from the cardiologist to clarify lab result.   I shared that I would be happy to share result with sister tomorrow if this was OK with the patient and he agreed.

## 2016-09-07 NOTE — Patient Instructions (Addendum)
INR Today.   Continue your Warfarin tonight.   If your Creatinine level is between 1.5 and 2.1 we should proceed with the dose of Xarelto 15mg  once daily in the evening.   Your heart doctor will let you know if your blood test for your kidney function is worse AND if they would prefer [place] to stay on the warfarin.   Return to Dr. Reesa Chew in 3-4 weeks.

## 2016-09-07 NOTE — Progress Notes (Signed)
    S:     Chief Complaint  Patient presents with  . Medication Management    Warfarin    Patient arrives walking with assistance of a cane and accompanied by his sister, [name].   He arrives for discussion of alternative anticoagulation regimen to warfarin.   He states he has had multiple doctor visits today, reports he has had variation in his renal function and has some concern about the elimination of this medication by his kidneys.    He requested refill of his nitroglycerin which he did not have with him and be strongly believes in our of date.    O:  Physical Exam Minimal edema bilaterally in lower extremities.     Review of Systems  Cardiovascular: Negative for chest pain and leg swelling.  Endo/Heme/Allergies: Bruises/bleeds easily.  Denies any bleeding recently however notes easy bruising at times.    Lab Results  Component Value Date   HGBA1C 5.7 (H) 06/12/2016    A/P: Longstanding anticoagulation with warfarin in a patient willing to be switched to Xarelto (rivaroxaban) following discussion of advantages and disadvantages.   Patient was provided education on lack of required monitoring which is challenging as he drives > 30 minutes one way to visits with this sister.  We evaluated INR in office today.   Plan Warfarin 5mg  tonight and if blood work returns with similar renal function as in the past we will initiate 15mg  daily. New Rx sent.   Patient will await call from the cardiologist to clarify lab result.   I shared that I would be happy to share result with sister tomorrow if this was OK with the patient and he agreed.    Written patient instructions provided.  Total time in face to face counseling 45 minutes.   Follow up with PCP for reevaluation PRN.   Patient seen with Bennye Alm, PharmD, BCPS, PGY2 Resident.

## 2016-09-08 ENCOUNTER — Telehealth: Payer: Self-pay | Admitting: Pharmacist

## 2016-09-08 NOTE — Telephone Encounter (Signed)
Left message comunicating the kidney result was "stable" and plan would be to make changes as outlied at visit yesterday with me.   This plan - stop warfarin - no further doses (INR within the 2-3 range  yesterday) and start the new medication this evening.   New medication supplied with Rx to pharmacy was Xarelto 15mg  daily with supper.   Education was provied yesterday to both patient and sister.   Asked to call back if she had questions RE this plan for her brother.

## 2016-09-11 ENCOUNTER — Ambulatory Visit (INDEPENDENT_AMBULATORY_CARE_PROVIDER_SITE_OTHER): Payer: Medicare Other | Admitting: Orthopedic Surgery

## 2016-09-11 ENCOUNTER — Other Ambulatory Visit: Payer: Medicare Other

## 2016-09-11 NOTE — Telephone Encounter (Signed)
Noted and agree. 

## 2016-09-11 NOTE — Progress Notes (Signed)
Patient ID: Terry Martinez, male   DOB: [birth year], 76 y.o.   MRN: [id number]  Reviewed: Agree with Dr. Graylin Shiver documentation and management.

## 2016-09-12 ENCOUNTER — Telehealth: Payer: Self-pay | Admitting: Internal Medicine

## 2016-09-12 DIAGNOSIS — I2510 Atherosclerotic heart disease of native coronary artery without angina pectoris: Secondary | ICD-10-CM

## 2016-09-12 DIAGNOSIS — I4891 Unspecified atrial fibrillation: Secondary | ICD-10-CM

## 2016-09-12 MED ORDER — FUROSEMIDE 40 MG PO TABS: ORAL_TABLET | ORAL | 3 refills | Status: DC

## 2016-09-12 NOTE — Telephone Encounter (Signed)
Left message to call back  

## 2016-09-12 NOTE — Telephone Encounter (Signed)
That is OK to switch

## 2016-09-12 NOTE — Telephone Encounter (Signed)
Follow up    Pt sister is calling back for nurse.

## 2016-09-12 NOTE — Telephone Encounter (Signed)
New message     Pt sister called regarding possible changing blood thinner, to Xarelto , did you get the lab results back yet?

## 2016-09-12 NOTE — Telephone Encounter (Signed)
-----   Message from Dorris Carnes V, MD sent at 09/10/2016  5:11 PM EDT ----- Kidney function is up and down   I would recomm 40 lasix 1 day  20 the next  Alternate Hold potassium at this dose BMET in 2 wks  With BNP

## 2016-09-12 NOTE — Telephone Encounter (Signed)
Patient's sister made aware of results.  Instructed for patient to alternate lasix 40 mg (1 tablet) one day and 20 mg (1/2 tablet) the next. Sister states that the patient has taken 40 mg this morning already and states that patient will take 1/2 tablet (20 mg) tomorrow and then 40 mg the next. Also advised for patient to stop taking potassium. BMET and BNP ordered and appointment made for 09/26/16. Sister verbalizes understanding.  Sister also states that the [facility] at the patient's PCP has instructed for the patient to switch coumadin to Xarelto 15 mg QD. She wanted to check to see if this was okay with Dr. Harrington Challenger before doing so. Sister made aware that the information would be forwarded to Dr. Harrington Challenger for review and recommendation.

## 2016-09-13 ENCOUNTER — Telehealth: Payer: Self-pay | Admitting: *Deleted

## 2016-09-13 NOTE — Telephone Encounter (Signed)
I spoke with patient's sister who states per the PCP and their pharmacist, Xarelto has already been ordered and the patient was just to wait for Dr. Alan Ripper approval before switching over.  Advised that it is ok with Dr. Harrington Challenger.  She will pick up medication and switch out the Xarelto for Coumadin this evening.  I reviewed with [facility] who recommends patient have an INR prior to starting  Xarelto.  Called and advised patient's sister of this.  She will contact the pharmacist at his PCP office who manages his coumadin.

## 2016-09-13 NOTE — Telephone Encounter (Signed)
Pt sister calling to dr Valentina Lucks about his lab results. They were supposed to switch to xarelto but havent heard back bout his labs. Per Herbie Baltimore, inform the pt to continue to take coumadin until he hears back from the dr koval/pcp. Please advise. Emmerson Shuffield Kennon Holter, CMA

## 2016-09-14 NOTE — Telephone Encounter (Signed)
INR 2.8.   Will defer further management to Dr. Valentina Lucks however agree with Herbie Baltimore for pt to continue his Coumadin for now.

## 2016-09-15 NOTE — Telephone Encounter (Signed)
Thanks Dr Koval 

## 2016-09-18 NOTE — Telephone Encounter (Signed)
Pts sister would like to talk to Dr Valentina Lucks.  She didn't receive the previous message. Sister has been told Dr Valentina Lucks would be the one to decide about pt going on xarelto and his INR.  Please contact sister Margeret

## 2016-09-20 ENCOUNTER — Ambulatory Visit (INDEPENDENT_AMBULATORY_CARE_PROVIDER_SITE_OTHER): Payer: Medicare Other | Admitting: Orthopedic Surgery

## 2016-09-21 ENCOUNTER — Other Ambulatory Visit: Payer: Self-pay | Admitting: Family Medicine

## 2016-09-21 NOTE — Telephone Encounter (Signed)
Spoke with patient's sister, will change warfarin to

## 2016-09-21 NOTE — Telephone Encounter (Signed)
Phone call to patient's phone # lead to an answering machine.  Left message that I would try to reach his sister [name]).   Called patients' sister.   Apologized for the delay in transition of anticoagulation follow-up.   Shared that both the cardiologist and the PCP physician were in agreement to switch from Warfarin to Xarelto (rivaroxaban).   Patient's sister shared that she was told that we should switch after the next INR - scheduled for next Wed (8/8).   At that time if the INR is <3 we can proceed with switching.     Plan to start Xarelto 15mg  once daily in the evening next Wed 09/27/2016 if INR is below 3.0.  Contact me that afternoon to discuss dosing if INR is > 3.

## 2016-09-25 ENCOUNTER — Ambulatory Visit (INDEPENDENT_AMBULATORY_CARE_PROVIDER_SITE_OTHER): Payer: Medicare Other | Admitting: Orthopedic Surgery

## 2016-09-25 ENCOUNTER — Encounter (INDEPENDENT_AMBULATORY_CARE_PROVIDER_SITE_OTHER): Payer: Self-pay | Admitting: Orthopedic Surgery

## 2016-09-25 DIAGNOSIS — M6701 Short Achilles tendon (acquired), right ankle: Secondary | ICD-10-CM | POA: Diagnosis not present

## 2016-09-25 DIAGNOSIS — L97521 Non-pressure chronic ulcer of other part of left foot limited to breakdown of skin: Secondary | ICD-10-CM

## 2016-09-25 DIAGNOSIS — L97511 Non-pressure chronic ulcer of other part of right foot limited to breakdown of skin: Secondary | ICD-10-CM

## 2016-09-25 DIAGNOSIS — M6702 Short Achilles tendon (acquired), left ankle: Secondary | ICD-10-CM

## 2016-09-25 DIAGNOSIS — I779 Disorder of arteries and arterioles, unspecified: Secondary | ICD-10-CM | POA: Diagnosis not present
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
## 2016-09-26 ENCOUNTER — Ambulatory Visit: Payer: Medicare Other

## 2016-09-26 ENCOUNTER — Other Ambulatory Visit: Payer: Medicare Other

## 2016-09-27 ENCOUNTER — Ambulatory Visit: Payer: Medicare Other

## 2016-09-27 ENCOUNTER — Ambulatory Visit (INDEPENDENT_AMBULATORY_CARE_PROVIDER_SITE_OTHER): Payer: Medicare Other | Admitting: *Deleted

## 2016-09-27 ENCOUNTER — Other Ambulatory Visit: Payer: Medicare Other

## 2016-09-27 DIAGNOSIS — I4891 Unspecified atrial fibrillation: Secondary | ICD-10-CM | POA: Diagnosis not present

## 2016-09-27 DIAGNOSIS — Z7901 Long term (current) use of anticoagulants: Secondary | ICD-10-CM

## 2016-09-27 DIAGNOSIS — I10 Essential (primary) hypertension: Secondary | ICD-10-CM | POA: Diagnosis not present

## 2016-09-27 LAB — POCT INR: INR: 2.2

## 2016-09-28 LAB — BASIC METABOLIC PANEL
BUN/Creatinine Ratio: 16 (ref 10–24)
BUN: 28 mg/dL — AB (ref 8–27)
CALCIUM: 9.2 mg/dL (ref 8.6–10.2)
CHLORIDE: 102 mmol/L (ref 96–106)
CO2: 26 mmol/L (ref 20–29)
Creatinine, Ser: 1.77 mg/dL — ABNORMAL HIGH (ref 0.76–1.27)
GFR calc Af Amer: 42 mL/min/{1.73_m2} — ABNORMAL LOW (ref >59)
GFR calc non Af Amer: 36 mL/min/{1.73_m2} — ABNORMAL LOW (ref >59)
GLUCOSE: 79 mg/dL (ref 65–99)
Potassium: 4.7 mmol/L (ref 3.5–5.2)
Sodium: 142 mmol/L (ref 134–144)

## 2016-09-28 LAB — PRO B NATRIURETIC PEPTIDE: NT-PRO BNP: 3677 pg/mL — AB (ref 0–486)

## 2016-09-29 ENCOUNTER — Telehealth: Payer: Self-pay | Admitting: *Deleted

## 2016-09-29 ENCOUNTER — Telehealth: Payer: Self-pay | Admitting: Gastroenterology

## 2016-09-29 NOTE — Telephone Encounter (Signed)
I reviewed recent cardiology notes- don't think we need to check with cardiology again now that he has switched to [place]. He need to hold Xarelto for 24 hours prior to the procedure , so don't take the day before the procedure. Also be sure to remind them that if he is having any increased problems with CHF- with SOB or increased swelling in legs they need to notify us so we can delay/rechedule colonoscopy

## 2016-09-29 NOTE — Telephone Encounter (Signed)
The pt's sister has been advised of the xarelto order 24 hour hold prior to procedure

## 2016-09-29 NOTE — Telephone Encounter (Signed)
Called and spoke to the patient's sister, Garald Braver.  Advised her that he can hold the Xarelto on 8-22, 8-23. He can resume it after the colonoscopy, unless Dr. Fuller Plan advises him otherwise. Also, please let us know if he is having any increased problems with CHF with SOB or increased swelling in the legs. If he is we may have to reschedule the colonoscopy. She said she would call us and let us know if he has any of these problems.

## 2016-09-29 NOTE — Telephone Encounter (Signed)
Left message on machine to call back  

## 2016-09-29 NOTE — Telephone Encounter (Signed)
Pam, this pt saw Amy not sure if she is ok with the original order to hold coumadin or if she needs to confirm with cardiologist.

## 2016-10-02 ENCOUNTER — Telehealth: Payer: Self-pay | Admitting: Internal Medicine

## 2016-10-02 NOTE — Telephone Encounter (Signed)
Pt c/o of Chest Pain: STAT if CP now or developed within 24 hours  1. Are you having CP right now? N/A 2. Are you experiencing any other symptoms (ex. SOB, nausea, vomiting, sweating)? sweating  3. How long have you been experiencing CP? Started yesterday  4. Is your CP continuous or coming and going? N/A  5. Have you taken Nitroglycerin? No  ?

## 2016-10-02 NOTE — Telephone Encounter (Signed)
F/U Call:   [name] (patient sister calling, was not with patient at the time of call.)

## 2016-10-02 NOTE — Telephone Encounter (Signed)
Received message from scheduler that patient's sister was calling to report patient had chest pain yesterday.  She was not at home with him at the time of the call.  I called patient - yesterday at church it was cold and he was freezing and came home and went to bed and stayed there until this morning.  He said that he missed his xarelto dose last evening and wanted to know what to do.  I asked him about the chest pain--he told me it was a lot of gas and that he feels great right now.  He was also SOB during that time.  He did not feel like he did when he had previous MI.  He has been up and shaved and about to get a shower.  Sister came home during our call so I spoke to her as well.  She is aware that if CP returns they should either call EMS or take patient to ER to be evaluated.  He has appointment with Dr. Harrington Challenger at end of this month.  Reviewed with pharmacist that it is ok to take usual dose of Xarelto this afternoon and informed patient of this.

## 2016-10-03 ENCOUNTER — Ambulatory Visit: Payer: Medicare Other | Admitting: Family Medicine

## 2016-10-12 ENCOUNTER — Encounter: Payer: Self-pay | Admitting: Gastroenterology

## 2016-10-12 ENCOUNTER — Ambulatory Visit (AMBULATORY_SURGERY_CENTER): Payer: Medicare Other | Admitting: Gastroenterology

## 2016-10-12 ENCOUNTER — Encounter: Payer: Medicare Other | Admitting: Gastroenterology

## 2016-10-12 VITALS — BP 108/65 | HR 58 | Temp 97.3°F | Resp 11 | Ht 75.0 in | Wt 310.0 lb

## 2016-10-12 DIAGNOSIS — I4891 Unspecified atrial fibrillation: Secondary | ICD-10-CM | POA: Diagnosis not present

## 2016-10-12 DIAGNOSIS — Z86010 Personal history of colonic polyps: Secondary | ICD-10-CM | POA: Diagnosis not present

## 2016-10-12 DIAGNOSIS — D120 Benign neoplasm of cecum: Secondary | ICD-10-CM | POA: Diagnosis not present

## 2016-10-12 DIAGNOSIS — J449 Chronic obstructive pulmonary disease, unspecified: Secondary | ICD-10-CM | POA: Diagnosis not present

## 2016-10-12 DIAGNOSIS — D123 Benign neoplasm of transverse colon: Secondary | ICD-10-CM | POA: Diagnosis not present

## 2016-10-12 DIAGNOSIS — I10 Essential (primary) hypertension: Secondary | ICD-10-CM | POA: Diagnosis not present

## 2016-10-12 DIAGNOSIS — I2510 Atherosclerotic heart disease of native coronary artery without angina pectoris: Secondary | ICD-10-CM | POA: Diagnosis not present

## 2016-10-12 DIAGNOSIS — I252 Old myocardial infarction: Secondary | ICD-10-CM | POA: Diagnosis not present

## 2016-10-12 MED ORDER — SODIUM CHLORIDE 0.9 % IV SOLN: 500.0000 mL | INTRAVENOUS | Status: DC

## 2016-10-12 NOTE — Progress Notes (Signed)
Report to PACU, RN, vss, BBS= Clear.  

## 2016-10-12 NOTE — Progress Notes (Signed)
Pt's states no medical or surgical changes since previsit or office visit. 

## 2016-10-12 NOTE — Op Note (Signed)
[facility] Patient Name: Terry Martinez Procedure Date: 10/12/2016 10:39 AM MRN: [id number] Endoscopist: Ladene Artist , MD Age: 76 Referring MD:  Date of Birth: Dec 01, 1940 Gender: Male Account #: [id number] Procedure:                Colonoscopy Indications:              Surveillance: Personal history of adenomatous                            polyps on last colonoscopy 3 years ago Medicines:                Monitored Anesthesia Care Procedure:                Pre-Anesthesia Assessment:                           - Prior to the procedure, a History and Physical                            was performed, and patient medications and                            allergies were reviewed. The patient's tolerance of                            previous anesthesia was also reviewed. The risks                            and benefits of the procedure and the sedation                            options and risks were discussed with the patient.                            All questions were answered, and informed consent                            was obtained. Prior Anticoagulants: The patient has                            taken Eliquis (apixaban), last dose was 2 days                            prior to procedure. ASA Grade Assessment: III - A                            patient with severe systemic disease. After                            reviewing the risks and benefits, the patient was                            deemed in satisfactory condition to undergo the  procedure.                           After obtaining informed consent, the colonoscope                            was passed under direct vision. Throughout the                            procedure, the patient's blood pressure, pulse, and                            oxygen saturations were monitored continuously. The                            Colonoscope was introduced through the anus and                advanced to the the cecum, identified by                            appendiceal orifice and ileocecal valve. The                            ileocecal valve, appendiceal orifice, and rectum                            were photographed. The quality of the bowel                            preparation was adequate. The colonoscopy was                            performed without difficulty. The patient tolerated                            the procedure well. Scope In: 10:48:49 AM Scope Out: 11:02:47 AM Scope Withdrawal Time: 0 hours 10 minutes 9 seconds  Total Procedure Duration: 0 hours 13 minutes 58 seconds  Findings:                 The perianal and digital rectal examinations were                            normal.                           Three sessile polyps were found in the transverse                            colon, cecum and ileocecal valve. The polyps were 6                            to 7 mm in size. These polyps were removed with a  cold snare. Resection and retrieval were complete.                           Internal hemorrhoids were found during                            retroflexion. The hemorrhoids were moderate and                            Grade I (internal hemorrhoids that do not prolapse).                           The exam was otherwise without abnormality on                            direct and retroflexion views. Complications:            No immediate complications. Estimated blood loss:                            None. Estimated Blood Loss:     Estimated blood loss: none. Impression:               - Three 6 to 7 mm polyps in the transverse colon,                            in the cecum and at the ileocecal valve, removed                            with a cold snare. Resected and retrieved.                           - Internal hemorrhoids.                           - The examination was otherwise normal on direct                             and retroflexion views. Recommendation:           - Resume Eliquis (apixaban) in 3 days at prior                            dose. Refer to managing physician for further                            adjustment of therapy.                           - Patient has a contact number available for                            emergencies. The signs and symptoms of potential                            delayed complications were discussed  with the                            patient. Return to normal activities tomorrow.                            Written discharge instructions were provided to the                            patient.                           - Resume previous diet.                           - Continue present medications.                           - Await pathology results.                           - No repeat colonoscopy due to age. Ladene Artist, MD 10/12/2016 11:07:52 AM This report has been signed electronically.

## 2016-10-12 NOTE — Progress Notes (Signed)
Called to room to assist during endoscopic procedure.  Patient ID and intended procedure confirmed with present staff. Received instructions for my participation in the procedure from the performing physician.  

## 2016-10-12 NOTE — Patient Instructions (Signed)
YOU HAD AN ENDOSCOPIC PROCEDURE TODAY AT [facility]:   Refer to the procedure report that was given to you for any specific questions about what was found during the examination.  If the procedure report does not answer your questions, please call your gastroenterologist to clarify.  If you requested that your care partner not be given the details of your procedure findings, then the procedure report has been included in a sealed envelope for you to review at your convenience later.  YOU SHOULD EXPECT: Some feelings of bloating in the abdomen. Passage of more gas than usual.  Walking can help get rid of the air that was put into your GI tract during the procedure and reduce the bloating. If you had a lower endoscopy (such as a colonoscopy or flexible sigmoidoscopy) you may notice spotting of blood in your stool or on the toilet paper. If you underwent a bowel prep for your procedure, you may not have a normal bowel movement for a few days.  Please Note:  You might notice some irritation and congestion in your nose or some drainage.  This is from the oxygen used during your procedure.  There is no need for concern and it should clear up in a day or so.  SYMPTOMS TO REPORT IMMEDIATELY:   Following lower endoscopy (colonoscopy or flexible sigmoidoscopy):  Excessive amounts of blood in the stool  Significant tenderness or worsening of abdominal pains  Swelling of the abdomen that is new, acute  Fever of 100F or higher   For urgent or emergent issues, a gastroenterologist can be reached at any hour by calling [phone].   May resume your Eliquis on Sunday. Please read all handouts given to you by your recovery nurse today.  DIET:  We do recommend a small meal at first, but then you may proceed to your regular diet.  Drink plenty of fluids but you should avoid alcoholic beverages for 24 hours.  ACTIVITY:  You should plan to take it easy for the rest of today and you should  NOT DRIVE or use heavy machinery until tomorrow (because of the sedation medicines used during the test).    FOLLOW UP: Our staff will call the number listed on your records the next business day following your procedure to check on you and address any questions or concerns that you may have regarding the information given to you following your procedure. If we do not reach you, we will leave a message.  However, if you are feeling well and you are not experiencing any problems, there is no need to return our call.  We will assume that you have returned to your regular daily activities without incident.  If any biopsies were taken you will be contacted by phone or by letter within the next 1-3 weeks.  Please call us at [phone] if you have not heard about the biopsies in 3 weeks.    SIGNATURES/CONFIDENTIALITY: You and/or your care partner have signed paperwork which will be entered into your electronic medical record.  These signatures attest to the fact that that the information above on your After Visit Summary has been reviewed and is understood.  Full responsibility of the confidentiality of this discharge information lies with you and/or your care-partner.  Thank you for letting us take care of your healthcare needs today.

## 2016-10-13 ENCOUNTER — Telehealth: Payer: Self-pay | Admitting: *Deleted

## 2016-10-13 ENCOUNTER — Ambulatory Visit: Payer: Medicare Other | Admitting: Family Medicine

## 2016-10-13 NOTE — Telephone Encounter (Signed)
  Follow up Call-  Call back number 10/12/2016  Post procedure Call Back phone  # [phone]  Permission to leave phone message Yes  Some recent data might be hidden     Patient questions:  Do you have a fever, pain , or abdominal swelling? No. Pain Score  0 *  Have you tolerated food without any problems? Yes.    Have you been able to return to your normal activities? Yes.    Do you have any questions about your discharge instructions: Diet   No. Medications  No. Follow up visit  No.  Do you have questions or concerns about your Care? No.  Actions: * If pain score is 4 or above: No action needed, pain <4.  Patient sneezing, explained It maybe from the o2 used and explained OTC treatments he can try.

## 2016-10-18 ENCOUNTER — Ambulatory Visit (INDEPENDENT_AMBULATORY_CARE_PROVIDER_SITE_OTHER): Payer: Medicare Other | Admitting: Family Medicine

## 2016-10-18 ENCOUNTER — Encounter: Payer: Self-pay | Admitting: Family Medicine

## 2016-10-18 DIAGNOSIS — I779 Disorder of arteries and arterioles, unspecified: Secondary | ICD-10-CM | POA: Diagnosis not present

## 2016-10-18 DIAGNOSIS — R1011 Right upper quadrant pain: Secondary | ICD-10-CM | POA: Diagnosis not present

## 2016-10-18 DIAGNOSIS — Z23 Encounter for immunization: Secondary | ICD-10-CM

## 2016-10-18 DIAGNOSIS — I482 Chronic atrial fibrillation: Secondary | ICD-10-CM

## 2016-10-18 DIAGNOSIS — I4821 Permanent atrial fibrillation: Secondary | ICD-10-CM

## 2016-10-18 MED ORDER — RIVAROXABAN 15 MG PO TABS: 15.0000 mg | ORAL_TABLET | Freq: Every day | ORAL | 3 refills | Status: DC

## 2016-10-18 NOTE — Progress Notes (Signed)
Date of Visit: 10/18/2016   HPI: Terry Martinez is a 53 yoM with a PMH of CAD, PVOD and atrial fib who presents today after two distinct episodes of chills and RUQ abdominal pain. On both occasions the pain was described as a knot in the RUQ of the abdomen and Terry Martinez endorsed being "freezing cold" and not able to get warm even after crawling under all the blankets he could. The first such episode occurred 3 weeks ago on a Sunday while the patient was sitting in [place]. He was not exerting himself at the time. The second episode occurred 8/28 while he was playing with his dogs in his bedroom. The pain and feeling of being cold persisted for about 12 hours on each occasion. The patient denies fevers, a productive cough, sweating at night, changes in vision, or changes in urination or bowel movements. He endorses a decrease in energy lately and a headache that started this morning    ROS: See HPI.   PHYSICAL EXAM: BP 130/64   Pulse 77   Temp 98.2 F (36.8 C) (Oral)   Ht 6\' 3"  (1.905 m)   Wt 293 lb 6.4 oz (133.1 kg)   SpO2 93%   BMI 36.67 kg/m  Gen: No acute distress  Heart: Irregularly Irregular rhythm, no murmurs, rubs or gallops  Lungs: no increased work of breathing, coarse crackles bilateral lower lung fields, upper lungs clear bilaterally  Abd: BS+, mild tenderness to palpation in RUQ, rest of abdomen non-tender, no rebound or guarding   Ext: 2+ edema bilateral lower extremities, capillary refill brisk  ASSESSMENT/PLAN:  Health maintenance:  Flu Shot given today   RUQ abdominal pain Terry Martinez presents with two distinct episodes of RUQ abdominal pain associated with chills that lasts for approximately 12 hours. With the patient's history it is important to consider MI or angina. However, the pain is not associated with activity and the location and lack of diaphoresis, nausea or vomiting are all atypical. It is possible that the pain could be infectious in origin, whether from the  lungs or the abdomen. However, the lack of fever and intermittent nature of the pain make this unlikely. Amongst other causes biliary cholic is a plausible solution due to the location of the pain, the intermittent nature and the relative lack of other symptoms and should be assessed further   -CBC to assess for bleeding in patient on Xarelto and to rule out infectious process  -CMP to assess Liver and gallbladder function -RUQ ultrasound to assess for Biliary Cholic  -Chest X-ray to rule out pulmonary effusion or infectious process in the RL Lung

## 2016-10-18 NOTE — Assessment & Plan Note (Addendum)
Mr. Terry Martinez presents with two distinct episodes of RUQ abdominal pain associated with chills that lasts for approximately 12 hours. With the patient's history it is important to consider MI or angina. However, the pain is not associated with activity and the location and lack of diaphoresis, nausea or vomiting are all atypical. It is possible that the pain could be infectious in origin, whether from the lungs or the abdomen. However, the lack of fever and intermittent nature of the pain make this unlikely. Amongst other causes biliary cholic is a plausible solution due to the location of the pain, the intermittent nature and the relative lack of other symptoms and should be assessed further   -CBC to assess for bleeding in patient on Xarelto and to rule out infectious process  -CMP to assess Liver and gallbladder function -RUQ ultrasound to assess for Biliary Cholic  -Chest X-ray to rule out pulmonary effusion or infectious process in the RL Lung   No firm clinical dx yet.  Keep diff dx open  CMP, no LFT abnormalities.  Creat slightly better.  Lipase normal CBC, nl WBC.  Hgb down a hair.  No clinical concern.

## 2016-10-18 NOTE — Patient Instructions (Signed)
The nurse will set up the chest x ray and gall bladder ultrasound. I will call with results.

## 2016-10-19 ENCOUNTER — Encounter: Payer: Self-pay | Admitting: Gastroenterology

## 2016-10-19 LAB — CMP14+EGFR
A/G RATIO: 1.3 (ref 1.2–2.2)
ALBUMIN: 4.1 g/dL (ref 3.5–4.8)
ALT: 12 IU/L (ref 0–44)
AST: 17 IU/L (ref 0–40)
Alkaline Phosphatase: 78 IU/L (ref 39–117)
BUN / CREAT RATIO: 19 (ref 10–24)
BUN: 32 mg/dL — ABNORMAL HIGH (ref 8–27)
Bilirubin Total: 0.7 mg/dL (ref 0.0–1.2)
CO2: 20 mmol/L (ref 20–29)
Calcium: 8.9 mg/dL (ref 8.6–10.2)
Chloride: 100 mmol/L (ref 96–106)
Creatinine, Ser: 1.66 mg/dL — ABNORMAL HIGH (ref 0.76–1.27)
GFR calc Af Amer: 46 mL/min/{1.73_m2} — ABNORMAL LOW (ref >59)
GFR, EST NON AFRICAN AMERICAN: 39 mL/min/{1.73_m2} — AB (ref >59)
GLOBULIN, TOTAL: 3.2 g/dL (ref 1.5–4.5)
Glucose: 115 mg/dL — ABNORMAL HIGH (ref 65–99)
POTASSIUM: 4.4 mmol/L (ref 3.5–5.2)
SODIUM: 138 mmol/L (ref 134–144)
Total Protein: 7.3 g/dL (ref 6.0–8.5)

## 2016-10-19 LAB — CBC
HEMATOCRIT: 33.6 % — AB (ref 37.5–51.0)
Hemoglobin: 10.9 g/dL — ABNORMAL LOW (ref 13.0–17.7)
MCH: 30.4 pg (ref 26.6–33.0)
MCHC: 32.4 g/dL (ref 31.5–35.7)
MCV: 94 fL (ref 79–97)
PLATELETS: 139 10*3/uL — AB (ref 150–379)
RBC: 3.58 x10E6/uL — ABNORMAL LOW (ref 4.14–5.80)
RDW: 17.2 % — AB (ref 12.3–15.4)
WBC: 9.2 10*3/uL (ref 3.4–10.8)

## 2016-10-19 LAB — LIPASE: Lipase: 5 U/L — ABNORMAL LOW (ref 13–78)

## 2016-10-20 ENCOUNTER — Ambulatory Visit
Admission: RE | Admit: 2016-10-20 | Discharge: 2016-10-20 | Disposition: A | Discharge status: Home / Self Care | Payer: Medicare Other | Source: Ambulatory Visit | Attending: Family Medicine | Admitting: Family Medicine

## 2016-10-20 ENCOUNTER — Ambulatory Visit (INDEPENDENT_AMBULATORY_CARE_PROVIDER_SITE_OTHER): Payer: Medicare Other | Admitting: Internal Medicine

## 2016-10-20 VITALS — BP 148/76 | HR 74 | Ht 75.0 in | Wt 292.0 lb

## 2016-10-20 DIAGNOSIS — I4891 Unspecified atrial fibrillation: Secondary | ICD-10-CM

## 2016-10-20 DIAGNOSIS — I779 Disorder of arteries and arterioles, unspecified: Secondary | ICD-10-CM

## 2016-10-20 DIAGNOSIS — R079 Chest pain, unspecified: Secondary | ICD-10-CM | POA: Diagnosis not present

## 2016-10-20 DIAGNOSIS — E782 Mixed hyperlipidemia: Secondary | ICD-10-CM | POA: Diagnosis not present

## 2016-10-20 DIAGNOSIS — R1011 Right upper quadrant pain: Secondary | ICD-10-CM

## 2016-10-20 DIAGNOSIS — I2510 Atherosclerotic heart disease of native coronary artery without angina pectoris: Secondary | ICD-10-CM

## 2016-10-20 DIAGNOSIS — I5043 Acute on chronic combined systolic (congestive) and diastolic (congestive) heart failure: Secondary | ICD-10-CM

## 2016-10-20 NOTE — Patient Instructions (Signed)
Your physician recommends that you continue on your current medications as directed. Please refer to the Current Medication list given to you today.  Your physician recommends that you schedule a follow-up appointment in: November, 2018 with Dr. Harrington Challenger.  Keep track of your blood pressures and send a list of readings to Dr. Harrington Challenger via [place].
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
## 2016-10-24 ENCOUNTER — Ambulatory Visit (INDEPENDENT_AMBULATORY_CARE_PROVIDER_SITE_OTHER): Payer: Medicare Other | Admitting: Orthopedic Surgery

## 2016-10-26 ENCOUNTER — Ambulatory Visit (HOSPITAL_COMMUNITY)
Admission: RE | Admit: 2016-10-26 | Discharge: 2016-10-26 | Disposition: A | Discharge status: Home / Self Care | Payer: Medicare Other | Source: Ambulatory Visit | Attending: Family Medicine | Admitting: Family Medicine

## 2016-10-26 DIAGNOSIS — R1011 Right upper quadrant pain: Secondary | ICD-10-CM | POA: Diagnosis not present

## 2016-10-30 ENCOUNTER — Encounter (INDEPENDENT_AMBULATORY_CARE_PROVIDER_SITE_OTHER): Payer: Self-pay | Admitting: Orthopedic Surgery

## 2016-10-30 ENCOUNTER — Ambulatory Visit (INDEPENDENT_AMBULATORY_CARE_PROVIDER_SITE_OTHER): Payer: Medicare Other | Admitting: Orthopedic Surgery

## 2016-10-30 DIAGNOSIS — I739 Peripheral vascular disease, unspecified: Secondary | ICD-10-CM | POA: Diagnosis not present

## 2016-10-30 DIAGNOSIS — Z89421 Acquired absence of other right toe(s): Secondary | ICD-10-CM

## 2016-10-30 DIAGNOSIS — Z89411 Acquired absence of right great toe: Secondary | ICD-10-CM | POA: Diagnosis not present

## 2016-10-30 DIAGNOSIS — I779 Disorder of arteries and arterioles, unspecified: Secondary | ICD-10-CM

## 2016-10-30 DIAGNOSIS — L97521 Non-pressure chronic ulcer of other part of left foot limited to breakdown of skin: Secondary | ICD-10-CM | POA: Diagnosis not present

## 2016-10-30 DIAGNOSIS — L97511 Non-pressure chronic ulcer of other part of right foot limited to breakdown of skin: Secondary | ICD-10-CM | POA: Diagnosis not present

## 2016-10-30 MED ORDER — DOXYCYCLINE MONOHYDRATE 100 MG PO TABS: 100.0000 mg | ORAL_TABLET | Freq: Two times a day (BID) | ORAL | 0 refills | Status: DC
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
## 2016-11-03 ENCOUNTER — Ambulatory Visit: Payer: Medicare Other | Admitting: Family Medicine

## 2016-11-08 ENCOUNTER — Encounter: Payer: Self-pay | Admitting: Vascular Surgery

## 2016-11-08 ENCOUNTER — Encounter: Payer: Self-pay | Admitting: Family Medicine

## 2016-11-08 ENCOUNTER — Ambulatory Visit (HOSPITAL_COMMUNITY)
Admission: RE | Admit: 2016-11-08 | Discharge: 2016-11-08 | Disposition: A | Discharge status: Home / Self Care | Payer: Medicare Other | Source: Ambulatory Visit | Attending: Vascular Surgery | Admitting: Vascular Surgery

## 2016-11-08 ENCOUNTER — Ambulatory Visit (INDEPENDENT_AMBULATORY_CARE_PROVIDER_SITE_OTHER): Payer: Medicare Other | Admitting: Family Medicine

## 2016-11-08 ENCOUNTER — Ambulatory Visit (INDEPENDENT_AMBULATORY_CARE_PROVIDER_SITE_OTHER): Payer: Medicare Other | Admitting: Vascular Surgery

## 2016-11-08 VITALS — BP 140/59 | HR 69 | Temp 97.0°F | Resp 20 | Ht 75.0 in | Wt 291.0 lb

## 2016-11-08 VITALS — BP 130/78 | HR 61 | Temp 97.3°F | Ht 75.0 in | Wt 291.2 lb

## 2016-11-08 DIAGNOSIS — I779 Disorder of arteries and arterioles, unspecified: Secondary | ICD-10-CM | POA: Diagnosis not present

## 2016-11-08 DIAGNOSIS — I739 Peripheral vascular disease, unspecified: Secondary | ICD-10-CM | POA: Diagnosis not present

## 2016-11-08 DIAGNOSIS — Z95820 Peripheral vascular angioplasty status with implants and grafts: Secondary | ICD-10-CM | POA: Insufficient documentation

## 2016-11-08 DIAGNOSIS — Z48812 Encounter for surgical aftercare following surgery on the circulatory system: Secondary | ICD-10-CM

## 2016-11-08 DIAGNOSIS — Z89411 Acquired absence of right great toe: Secondary | ICD-10-CM | POA: Diagnosis not present

## 2016-11-08 DIAGNOSIS — Z0000 Encounter for general adult medical examination without abnormal findings: Secondary | ICD-10-CM | POA: Diagnosis present

## 2016-11-08 DIAGNOSIS — I872 Venous insufficiency (chronic) (peripheral): Secondary | ICD-10-CM | POA: Diagnosis not present

## 2016-11-08 NOTE — Progress Notes (Signed)
   Subjective:   Patient ID: Terry Martinez    DOB: October 10, 1940, 76 y.o. male   MRN: [id number]  CC: meet PCP   HPI: Terry Martinez is a 76 y.o. male who presents to clinic today with his sister to meet pcp.  No concerns today.    -PMH: PAD, HTN, atrial fibrillation (on Xarelto), PVD, diabetic Charcot's arthropathy, h/o basal cell carcinoma (follows with Dermatology, has appt for next month), HLD, gout -Surgical Hx:  H/o femoral bypass 9 years ago, back surgery, R great toe and fifth toe amputations 3 years ago  -Social Hx: Lives with his sister and brother in [place], quit smoking cigarettes cold [place] about 18 years ago (used to smoke about 1.5-2 ppd x 40 years), about 4 years ago start dipping snuff.  No history of drug or alcohol use.    ROS: Denies fevers, chills, nausea, vomiting, diarrhea.  Denies chest pain, shortness of breath, palpitations.  Adrian: Pertinent past medical, surgical, family, and social history were reviewed and updated as appropriate. Smoking status reviewed. Medications reviewed.  Objective:   BP 130/78   Pulse 61   Temp (!) 97.3 F (36.3 C) (Oral)   Ht 6\' 3"  (1.905 m)   Wt 291 lb 3.2 oz (132.1 kg)   SpO2 90%   BMI 36.40 kg/m  Vitals and nursing note reviewed.  General: pleasant, alert, NAD  CV: RRR no MRG, 2+ pedal pulses  Lungs: CTAB, breathing is non-labored  Abdomen: soft, NTND, +bs  Skin: warm, dry  Extremities: 2+ pitting edema bilaterally  Neuro: sensation intact, 5/5 strength bilateral LE  Assessment & Plan:   76 yo male presents to clinic to meet PCP.  No current concerns addressed.  Reviewed medical, social and surgical history.  Reviewed medication list.   Health maintenance: -will need flu shot at next visit or when available   Lovenia Kim, MD [place], PGY-2 11/10/2016 11:01 AM

## 2016-11-08 NOTE — Progress Notes (Signed)
Patient name: Terry Martinez MRN: [id number] DOB: 1941-02-14 Sex: male  REASON FOR VISIT:    Follow up of peripheral vascular disease and chronic venous insufficiency.  HPI:   Terry Martinez is a pleasant 76 y.o. male Who I last saw on 05/03/2016. He underwent a left axillobifemoral bypass graft in [facility]. He had an area of increased velocities where the left to right femorofemoral bypass graft was anastomosed to the left axillofemoral bypass graft. I took him to the operating room on 04/18/2016 and he underwent patch angioplasty of this area. When I saw him last, he was doing well and I ordered a follow up graft duplex and ABIs in 6 months.  Today's he denies any specific complaints except for some recent bronchitis. He has an office visit today with his primary care physician.  He denies significant claudication in either lower extremity and denies rest pain. He has a small callus on the plantar aspect of his right foot. He denies fever. He denies any significant drainage from the right foot wound.  Past Medical History:  Diagnosis Date  . Anemia    hx low iron  . Arthritis    "hands" (2/262018)  . Atrial fibrillation ([place])   . Basal cell carcinoma    "left side of my face"  . Charcot's arthropathy   . Chronic pain   . Constipation   . COPD (chronic obstructive pulmonary disease) ([place])   . Coronary artery disease   . DVT, lower extremity ([place])    many years  . Dyspnea    with exertion  . Dysrhythmia    AFib  . Family history of adverse reaction to anesthesia    sister has difficulty waking up  . Gout   . History of blood transfusion 1960s   "related to being cut up w/barbed wire"  . History of kidney stones   . History of kidney stones   . History of kidney stones   . Hyperlipidemia   . Hypertension   . Incisional hernia    x 2  . Myocardial infarction ([place])    3 stents  . Peripheral artery disease ([place])   . Pneumonia   . 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 Subclavian artery stenosis, left (HCC)    [name] 04/17/2016    Family History  Problem Relation Age of Onset  . Diabetes Mother   . Cancer Mother        Right Breast  . Heart disease Mother   . Hyperlipidemia Mother   . Hypertension Mother   . Lymphoma Mother        Chemo  . Lymphoma Father   . Alcohol abuse Sister   . Heart disease Sister   . Hyperlipidemia Sister   . Hypertension Sister   . Stroke Sister   . Heart disease Brother   . Depression Brother   . Early death Brother   . Hyperlipidemia Brother   . Hypertension Brother   . Liver cancer Brother   . Heart disease Maternal Grandmother   . Kidney disease Maternal Grandmother   . Heart disease Maternal Grandfather   . Kidney disease Maternal Grandfather   . Emphysema Maternal Grandfather   . Lung cancer Sister     SOCIAL HISTORY: Social History  Substance Use Topics  . Smoking status: Former Smoker    Packs/day: 2.50    Years: 46.00    Types: Cigarettes    Quit  date: 07/22/1998  . Smokeless tobacco: Current User    Types: Snuff  . Alcohol use No    Allergies  Allergen Reactions  . Zocor [Simvastatin] Hives and Rash    Current Outpatient Prescriptions  Medication Sig Dispense Refill  . acetaminophen (TYLENOL) 500 MG tablet Take 1,000 mg by mouth every 8 (eight) hours as needed for moderate pain or headache.     . albuterol (PROVENTIL HFA;VENTOLIN HFA) 108 (90 BASE) MCG/ACT inhaler Inhale 2 puffs into the lungs every 6 (six) hours as needed for wheezing or shortness of breath. 1 Inhaler 2  . allopurinol (ZYLOPRIM) 100 MG tablet TAKE ONE TABLET BY MOUTH ONCE DAILY IN THE EVENING 90 tablet 2  . allopurinol (ZYLOPRIM) 300 MG tablet TAKE ONE TABLET BY MOUTH ONCE DAILY IN THE MORNING 90 tablet 3  . Ascorbic Acid (VITAMIN C PO) Take 1 tablet by mouth daily.    . Carboxymethylcellul-Glycerin (LUBRICATING EYE DROPS OP) Apply 1 drop to eye daily as needed (dry eyes).    .  Cholecalciferol (VITAMIN D-3 PO) Take 1 tablet by mouth daily.    [name] doxycycline (ADOXA) 100 MG tablet Take 1 tablet (100 mg total) by mouth 2 (two) times daily. 60 tablet 0  . enalapril (VASOTEC) 10 MG tablet TAKE ONE TABLET BY MOUTH ONCE DAILY 90 tablet 1  . EPINEPHrine (EPI-PEN) 0.3 mg/0.3 mL SOAJ injection Inject 0.3 mLs (0.3 mg total) into the muscle once. 1 Device 1  . furosemide (LASIX) 40 MG tablet Alternate taking 40 mg (1 tablet) and 20 mg (1/2 tablet) once daily. 45 tablet 3  . lovastatin (MEVACOR) 20 MG tablet TAKE ONE TABLET BY MOUTH AT BEDTIME 90 tablet 2  . metoprolol tartrate (LOPRESSOR) 25 MG tablet Take 0.5 tablets (12.5 mg total) by mouth 2 (two) times daily. 60 tablet 6  . metroNIDAZOLE (METROGEL) 1 % gel Apply topically daily. For rosacea 45 g 2  . nitroGLYCERIN (NITROSTAT) 0.4 MG SL tablet Place 1 tablet (0.4 mg total) under the tongue every 5 (five) minutes as needed for chest pain. 90 tablet 3  . polyethylene glycol powder (GLYCOLAX/MIRALAX) powder DISSOLVE 17G (1 CAPFUL) OF POWDER IN 8 OUNCES OF LIQUID AND DRINK 2 TIMES PER DAY AS NEEDED FOR MILD CONSTIPATION 527 g 11  . Rivaroxaban (XARELTO) 15 MG TABS tablet Take 1 tablet (15 mg total) by mouth daily with supper. 90 tablet 3   Current Facility-Administered Medications  Medication Dose Route Frequency Provider Last Rate Last Dose  . 0.9 %  sodium chloride infusion  500 mL Intravenous Continuous Ladene Artist, MD        REVIEW OF SYSTEMS:  [X]  denotes positive finding, [ ]  denotes negative finding Cardiac  Comments:  Chest pain or chest pressure:    Shortness of breath upon exertion: X   Short of breath when lying flat:    Irregular heart rhythm: X       Vascular    Pain in calf, thigh, or hip brought on by ambulation:    Pain in feet at night that wakes you up from your sleep:     Blood clot in your veins:    Leg swelling:  X       Pulmonary    Oxygen at home:    Productive cough:  X   Wheezing:  X         Neurologic    Sudden weakness in arms or legs:     Sudden numbness in arms or  legs:     Sudden onset of difficulty speaking or slurred speech:    Temporary loss of vision in one eye:     Problems with dizziness:         Gastrointestinal    Blood in stool:     Vomited blood:         Genitourinary    Burning when urinating:     Blood in urine:        Psychiatric    Major depression:         Hematologic    Bleeding problems:    Problems with blood clotting too easily:        Skin    Rashes or ulcers: X       Constitutional    Chills: X    PHYSICAL EXAM:   Vitals:   11/08/16 1446  BP: (!) 140/59  Pulse: 69  Resp: 20  Temp: (!) 97 F (36.1 C)  SpO2: 94%  Weight: 291 lb (132 kg)  Height: 6\' 3"  (1.905 m)    GENERAL: The patient is a well-nourished male, in no acute distress. The vital signs are documented above. CARDIAC: There is a regular rate and rhythm.  VASCULAR: I do not detect carotid bruits. I cannot palpate pedal pulses however he has biphasic Doppler signals in both feet. He has mild bilateral lower extremity swelling. He has hyperpigmentation consistent with chronic venous insufficiency. PULMONARY: There is good air exchange bilaterally without wheezing or rales. ABDOMEN: Soft and non-tender with normal pitched bowel sounds.  MUSCULOSKELETAL: There are no major deformities or cyanosis. NEUROLOGIC: No focal weakness or paresthesias are detected. SKIN: He has a callus on the plantar aspect of his right foot. PSYCHIATRIC: The patient has a normal affect.  DATA:    ARTERIAL DOPPLER STUDY: I have independently interpreted his arterial Doppler study today.  On the right side there is a biphasic dorsalis pedis and posterior tibial signal with an ABI of 88%.  On the left side there is a biphasic dorsalis pedis and posterior tibial signal with an ABI of 92%.  ARTERIAL DUPLEX: I have independently interpreted his arterial duplex scan today. This shows that  the left axillobifemoral bypass graft is patent with some hyperplasia noted in the left femoral anastomosis. This suggests a 50-70% stenosis. He does have some elevated velocities at the proximal anastomosis of his left axillofemoral bypass graft.  MEDICAL ISSUES:   STATUS POST LEFT AXILLOBIFEMORAL BYPASS GRAFT: His bypass graft is patent with biphasic Doppler signals in both feet. He is not a smoker. I encouraged him to stay as active as possible. We need to continue to follow his graft closely. He does have some elevated velocities at the proximal anastomosis. If this progress we could consider arteriography. I will order follow up duplex scan of his graft and follow up ABIs in 6 months and I'll see him back at that time.  CHRONIC VENOUS INSUFFICIENCY: We have again discussed the importance of intermittent leg elevation.  I plan on seeing him back in 6 months. He knows to call sooner if he has problems.  Deitra Mayo [facility] [phone]

## 2016-11-08 NOTE — Patient Instructions (Signed)
It was nice meeting you and your sister today! You were seen to establish care with me as your new pcp.  We discussed your medical and surgical history as well as your medications that require refills.    Lovenia Kim, MD

## 2016-11-12 ENCOUNTER — Encounter: Payer: Self-pay | Admitting: Family Medicine

## 2016-11-14 DIAGNOSIS — L570 Actinic keratosis: Secondary | ICD-10-CM | POA: Diagnosis not present

## 2016-11-14 DIAGNOSIS — L821 Other seborrheic keratosis: Secondary | ICD-10-CM | POA: Diagnosis not present

## 2016-11-14 DIAGNOSIS — D485 Neoplasm of uncertain behavior of skin: Secondary | ICD-10-CM | POA: Diagnosis not present

## 2016-11-14 DIAGNOSIS — D225 Melanocytic nevi of trunk: Secondary | ICD-10-CM | POA: Diagnosis not present

## 2016-11-14 DIAGNOSIS — L578 Other skin changes due to chronic exposure to nonionizing radiation: Secondary | ICD-10-CM | POA: Diagnosis not present

## 2016-11-14 DIAGNOSIS — Z85820 Personal history of malignant melanoma of skin: Secondary | ICD-10-CM | POA: Diagnosis not present

## 2016-11-14 DIAGNOSIS — I8310 Varicose veins of unspecified lower extremity with inflammation: Secondary | ICD-10-CM | POA: Diagnosis not present

## 2016-11-15 ENCOUNTER — Ambulatory Visit (INDEPENDENT_AMBULATORY_CARE_PROVIDER_SITE_OTHER): Payer: Medicare Other | Admitting: Family Medicine

## 2016-11-15 DIAGNOSIS — M79672 Pain in left foot: Secondary | ICD-10-CM

## 2016-11-15 DIAGNOSIS — I779 Disorder of arteries and arterioles, unspecified: Secondary | ICD-10-CM | POA: Diagnosis not present

## 2016-11-15 DIAGNOSIS — M6701 Short Achilles tendon (acquired), right ankle: Secondary | ICD-10-CM

## 2016-11-15 DIAGNOSIS — M6702 Short Achilles tendon (acquired), left ankle: Secondary | ICD-10-CM

## 2016-11-15 DIAGNOSIS — M79671 Pain in right foot: Secondary | ICD-10-CM | POA: Diagnosis not present

## 2016-11-15 DIAGNOSIS — Z89411 Acquired absence of right great toe: Secondary | ICD-10-CM | POA: Diagnosis not present

## 2016-11-15 DIAGNOSIS — L97511 Non-pressure chronic ulcer of other part of right foot limited to breakdown of skin: Secondary | ICD-10-CM

## 2016-11-15 NOTE — Progress Notes (Signed)
   Subjective:   Patient ID: Terry Martinez    DOB: 1941/01/08, 76 y.o. male   MRN: [id number]  CC: bilateral foot pain   HPI: Terry Martinez is a 76 y.o. male who presents to clinic today for foot pain.  Presents with his brother in law for visit.   Foot pain  -Complaining of bilateral foot discomfort with pain beneath the toepads of right foot as well as tip of left fourth toe.  He sees Dr. Sharol Given for foot care.   -Pain bothersome x 10 years -Has history of extensive surgery on both feet including right foot great toe and 5th toe amputation by Dr. Sharol Given 1.5 years ago.  The bone has been shaved down the side of foot.  Pressure ulcers present bilaterally on bottom of feet.  -Currently wears inserts from Dr. Sharol Given last seen 3 months ago; feels like they have not provided much relief and would like to try different inserts vs adjust the ones he already has   -History of diabetic Charcot arthropathy of left foot, no history of diabetes per chart review - sugars are well controlled, last A1c 5.27 May 2016  -Reports his gait is unsteady due to this pain.  -Denies fevers, chills, nausea, vomiting.    ROS: See HPI for pertinent ROS. [place]: Pertinent past medical, surgical, family, and social history were reviewed and updated as appropriate. Smoking status reviewed. Medications reviewed.  Objective:   BP 125/70 (BP Location: Right Arm, Patient Position: Sitting, Cuff Size: Normal)   Pulse (!) 57   Temp (!) 97.5 F (36.4 C) (Oral)   Wt 287 lb 6.4 oz (130.4 kg)   SpO2 93%   BMI 35.92 kg/m  Vitals and nursing note reviewed.  General: 76 yo male, NAD  Skin: warm, dry, no rashes  Extremities: warm and well perfused,  2+ pedal pulses.   MSK: Foot- superficial ulcer over the left fourth toe pad as well as beneath the third metatarsal head of the right foot.  Wounds appear clean and dry.  Normal ROM.  Neuro:  Normal sensation in lower extremities bilaterally. Antalgic gait.   Assessment & Plan:    Bilateral foot pain Patient with history of surgery to bilateral feet, wishes to try different type of insole.  Follows with Dr. Sharol Given and vascular.  -Discussed may benefit from [place] insert - patient agreeable to try this -can consider custom orthotics in the future if no relief  -will continue to monitor   Follow up: PRN   Lovenia Kim, MD [place], PGY-2 11/17/2016 3:47 PM

## 2016-11-15 NOTE — Patient Instructions (Addendum)
You were seen in clinic today for foot pain.  I have discussed with the [facility] for you to try insoles.  You can pick these up from the following address:  [address], [place] Phone #: [phone]  Please call clinic with any questions.     Be well,  Lovenia Kim, MD

## 2016-11-17 DIAGNOSIS — M79671 Pain in right foot: Secondary | ICD-10-CM | POA: Insufficient documentation

## 2016-11-17 DIAGNOSIS — M79672 Pain in left foot: Secondary | ICD-10-CM

## 2016-11-17 NOTE — Assessment & Plan Note (Addendum)
Patient with history of surgery to bilateral feet, wishes to try different type of insole.  Follows with Dr. Sharol Given and vascular.  -Discussed may benefit from [place] insert - patient agreeable to try this -can consider custom orthotics in the future if no relief  -will continue to monitor

## 2016-11-20 ENCOUNTER — Ambulatory Visit (INDEPENDENT_AMBULATORY_CARE_PROVIDER_SITE_OTHER): Payer: Medicare Other | Admitting: Orthopedic Surgery

## 2016-11-20 ENCOUNTER — Encounter (INDEPENDENT_AMBULATORY_CARE_PROVIDER_SITE_OTHER): Payer: Self-pay | Admitting: Orthopedic Surgery

## 2016-11-20 DIAGNOSIS — I739 Peripheral vascular disease, unspecified: Secondary | ICD-10-CM

## 2016-11-20 DIAGNOSIS — L97511 Non-pressure chronic ulcer of other part of right foot limited to breakdown of skin: Secondary | ICD-10-CM

## 2016-11-20 DIAGNOSIS — L97521 Non-pressure chronic ulcer of other part of left foot limited to breakdown of skin: Secondary | ICD-10-CM
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
## 2016-12-25 ENCOUNTER — Encounter (INDEPENDENT_AMBULATORY_CARE_PROVIDER_SITE_OTHER): Payer: Self-pay | Admitting: Orthopedic Surgery

## 2016-12-25 ENCOUNTER — Ambulatory Visit (INDEPENDENT_AMBULATORY_CARE_PROVIDER_SITE_OTHER): Payer: Medicare Other | Admitting: Internal Medicine

## 2016-12-25 ENCOUNTER — Ambulatory Visit (INDEPENDENT_AMBULATORY_CARE_PROVIDER_SITE_OTHER): Payer: Medicare Other | Admitting: Orthopedic Surgery

## 2016-12-25 ENCOUNTER — Encounter: Payer: Self-pay | Admitting: Internal Medicine

## 2016-12-25 VITALS — BP 140/60 | HR 49 | Ht 75.0 in | Wt 299.8 lb

## 2016-12-25 DIAGNOSIS — I10 Essential (primary) hypertension: Secondary | ICD-10-CM | POA: Diagnosis not present

## 2016-12-25 DIAGNOSIS — I872 Venous insufficiency (chronic) (peripheral): Secondary | ICD-10-CM

## 2016-12-25 DIAGNOSIS — I2510 Atherosclerotic heart disease of native coronary artery without angina pectoris: Secondary | ICD-10-CM | POA: Diagnosis not present

## 2016-12-25 DIAGNOSIS — L97511 Non-pressure chronic ulcer of other part of right foot limited to breakdown of skin: Secondary | ICD-10-CM

## 2016-12-25 DIAGNOSIS — I779 Disorder of arteries and arterioles, unspecified: Secondary | ICD-10-CM

## 2016-12-25 DIAGNOSIS — E785 Hyperlipidemia, unspecified: Secondary | ICD-10-CM

## 2016-12-25 DIAGNOSIS — I482 Chronic atrial fibrillation: Secondary | ICD-10-CM

## 2016-12-25 DIAGNOSIS — I4821 Permanent atrial fibrillation: Secondary | ICD-10-CM

## 2016-12-25 LAB — TSH: TSH: 3.33 u[IU]/mL (ref 0.450–4.500)

## 2016-12-25 LAB — BASIC METABOLIC PANEL
BUN/Creatinine Ratio: 18 (ref 10–24)
BUN: 29 mg/dL — AB (ref 8–27)
CALCIUM: 9.2 mg/dL (ref 8.6–10.2)
CO2: 24 mmol/L (ref 20–29)
CREATININE: 1.61 mg/dL — AB (ref 0.76–1.27)
Chloride: 102 mmol/L (ref 96–106)
GFR, EST AFRICAN AMERICAN: 47 mL/min/{1.73_m2} — AB (ref >59)
GFR, EST NON AFRICAN AMERICAN: 41 mL/min/{1.73_m2} — AB (ref >59)
Glucose: 97 mg/dL (ref 65–99)
Potassium: 4.7 mmol/L (ref 3.5–5.2)
Sodium: 137 mmol/L (ref 134–144)

## 2016-12-25 LAB — MAGNESIUM: Magnesium: 2.1 mg/dL (ref 1.6–2.3)
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
## 2016-12-25 NOTE — Patient Instructions (Signed)
Your physician recommends that you continue on your current medications as directed. Please refer to the Current Medication list given to you today. Your physician recommends that you return for lab work TODAY (BMET, MAGNESIUM, TSH)
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
## 2016-12-27 ENCOUNTER — Ambulatory Visit (INDEPENDENT_AMBULATORY_CARE_PROVIDER_SITE_OTHER): Payer: Medicare Other | Admitting: Family

## 2016-12-28 ENCOUNTER — Other Ambulatory Visit: Payer: Self-pay | Admitting: Family Medicine

## 2016-12-28 ENCOUNTER — Telehealth: Payer: Self-pay | Admitting: Family Medicine

## 2016-12-28 ENCOUNTER — Telehealth: Payer: Self-pay | Admitting: Internal Medicine

## 2016-12-28 DIAGNOSIS — Z85828 Personal history of other malignant neoplasm of skin: Secondary | ICD-10-CM

## 2016-12-28 NOTE — Telephone Encounter (Signed)
Ms. Juliene Pina (patient sister) returning call for lab results

## 2016-12-28 NOTE — Telephone Encounter (Signed)
Needs referral to dermatalogist Dr Michele Mcalpine in [place].  Phone  [phone]. Pt has already been seeing this dr. He has an appt on Tues Nov 13

## 2016-12-28 NOTE — Telephone Encounter (Signed)
DPR on file for sister Garald Braver - LM to [facility] for review results.

## 2016-12-28 NOTE — Telephone Encounter (Signed)
I have placed referral for Dermatology for patient, please inform him.

## 2016-12-29 NOTE — Telephone Encounter (Signed)
Pt informed. Jermy Couper, CMA  

## 2017-01-01 ENCOUNTER — Other Ambulatory Visit: Payer: Self-pay | Admitting: *Deleted

## 2017-01-02 DIAGNOSIS — C44311 Basal cell carcinoma of skin of nose: Secondary | ICD-10-CM | POA: Diagnosis not present

## 2017-01-02 DIAGNOSIS — C4361 Malignant melanoma of right upper limb, including shoulder: Secondary | ICD-10-CM | POA: Diagnosis not present

## 2017-01-02 DIAGNOSIS — L570 Actinic keratosis: Secondary | ICD-10-CM | POA: Diagnosis not present

## 2017-01-02 DIAGNOSIS — Z85820 Personal history of malignant melanoma of skin: Secondary | ICD-10-CM | POA: Diagnosis not present

## 2017-01-02 NOTE — Telephone Encounter (Signed)
Notes recorded by Rodman Key, RN on 01/01/2017 at 5:44 PM EST Spoke with Garald Braver, patients sister (DPR). Informed of lab results and lasix dose change. She doesn't think he has a kidney doctor, just urology. She is aware we will ref to renal if Dr. Harrington Challenger recommends this. ------  Notes recorded by Richmond Campbell, LPN on 87/06/6431 at 29:51 PM EST LM TO [place] ------  Notes recorded by Fay Records, MD on 12/27/2016 at 10:35 PM EST Thyroid function is normal Magnesium is normal Kidney function is relatively unchanged from last check Still mod decreased Can take 40 lasix day for 3 days then alternate 40 and 20 Watch salt Get records from renal clinic ------  Notes recorded by Katrine Coho, RN on 12/26/2016 at 9:43 AM EST Preliminarily reviewed by Triage. Awaiting MD review and signature.

## 2017-01-04 ENCOUNTER — Other Ambulatory Visit: Payer: Self-pay

## 2017-01-04 DIAGNOSIS — N289 Disorder of kidney and ureter, unspecified: Secondary | ICD-10-CM

## 2017-01-04 NOTE — Progress Notes (Signed)
Fay Records, MD sent to Rodman Key, RN        I would refer to renal with renal insufficiency    Order placed per Dr. Harrington Challenger.

## 2017-01-22 ENCOUNTER — Ambulatory Visit (INDEPENDENT_AMBULATORY_CARE_PROVIDER_SITE_OTHER): Payer: Medicare Other | Admitting: Family

## 2017-01-22 ENCOUNTER — Encounter (INDEPENDENT_AMBULATORY_CARE_PROVIDER_SITE_OTHER): Payer: Self-pay | Admitting: Orthopedic Surgery

## 2017-01-22 DIAGNOSIS — M6701 Short Achilles tendon (acquired), right ankle: Secondary | ICD-10-CM | POA: Diagnosis not present

## 2017-01-22 DIAGNOSIS — M6702 Short Achilles tendon (acquired), left ankle: Secondary | ICD-10-CM

## 2017-01-22 DIAGNOSIS — L97511 Non-pressure chronic ulcer of other part of right foot limited to breakdown of skin: Secondary | ICD-10-CM | POA: Diagnosis not present

## 2017-01-22 DIAGNOSIS — I779 Disorder of arteries and arterioles, unspecified: Secondary | ICD-10-CM

## 2017-01-22 DIAGNOSIS — Z89421 Acquired absence of other right toe(s): Secondary | ICD-10-CM

## 2017-01-22 DIAGNOSIS — I739 Peripheral vascular disease, unspecified: Secondary | ICD-10-CM | POA: Diagnosis not present
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
## 2017-01-25 ENCOUNTER — Encounter: Payer: Self-pay | Admitting: Internal Medicine

## 2017-01-25 ENCOUNTER — Other Ambulatory Visit: Payer: Self-pay

## 2017-01-25 ENCOUNTER — Encounter (HOSPITAL_COMMUNITY): Payer: Self-pay | Admitting: General Practice

## 2017-01-25 ENCOUNTER — Ambulatory Visit (INDEPENDENT_AMBULATORY_CARE_PROVIDER_SITE_OTHER): Payer: Medicare Other | Admitting: Internal Medicine

## 2017-01-25 ENCOUNTER — Inpatient Hospital Stay (HOSPITAL_COMMUNITY)
Admission: AD | Admit: 2017-01-25 | Discharge: 2017-01-27 | DRG: 291 | Disposition: A | Discharge status: Home Health | Payer: Medicare Other | Source: Ambulatory Visit | Attending: Internal Medicine | Admitting: Internal Medicine

## 2017-01-25 VITALS — BP 140/70 | HR 89 | Ht 75.0 in | Wt 304.8 lb

## 2017-01-25 DIAGNOSIS — M109 Gout, unspecified: Secondary | ICD-10-CM | POA: Diagnosis present

## 2017-01-25 DIAGNOSIS — I252 Old myocardial infarction: Secondary | ICD-10-CM

## 2017-01-25 DIAGNOSIS — Z6836 Body mass index (BMI) 36.0-36.9, adult: Secondary | ICD-10-CM

## 2017-01-25 DIAGNOSIS — I4821 Permanent atrial fibrillation: Secondary | ICD-10-CM | POA: Diagnosis present

## 2017-01-25 DIAGNOSIS — Z7901 Long term (current) use of anticoagulants: Secondary | ICD-10-CM

## 2017-01-25 DIAGNOSIS — N183 Chronic kidney disease, stage 3 unspecified: Secondary | ICD-10-CM | POA: Diagnosis present

## 2017-01-25 DIAGNOSIS — I482 Chronic atrial fibrillation: Secondary | ICD-10-CM | POA: Diagnosis present

## 2017-01-25 DIAGNOSIS — I351 Nonrheumatic aortic (valve) insufficiency: Secondary | ICD-10-CM | POA: Diagnosis present

## 2017-01-25 DIAGNOSIS — E785 Hyperlipidemia, unspecified: Secondary | ICD-10-CM | POA: Diagnosis present

## 2017-01-25 DIAGNOSIS — I2510 Atherosclerotic heart disease of native coronary artery without angina pectoris: Secondary | ICD-10-CM | POA: Diagnosis present

## 2017-01-25 DIAGNOSIS — Z955 Presence of coronary angioplasty implant and graft: Secondary | ICD-10-CM

## 2017-01-25 DIAGNOSIS — I5042 Chronic combined systolic (congestive) and diastolic (congestive) heart failure: Secondary | ICD-10-CM | POA: Diagnosis present

## 2017-01-25 DIAGNOSIS — I272 Pulmonary hypertension, unspecified: Secondary | ICD-10-CM | POA: Diagnosis present

## 2017-01-25 DIAGNOSIS — Z87891 Personal history of nicotine dependence: Secondary | ICD-10-CM

## 2017-01-25 DIAGNOSIS — I5043 Acute on chronic combined systolic (congestive) and diastolic (congestive) heart failure: Secondary | ICD-10-CM | POA: Diagnosis present

## 2017-01-25 DIAGNOSIS — Z807 Family history of other malignant neoplasms of lymphoid, hematopoietic and related tissues: Secondary | ICD-10-CM

## 2017-01-25 DIAGNOSIS — I10 Essential (primary) hypertension: Secondary | ICD-10-CM | POA: Diagnosis present

## 2017-01-25 DIAGNOSIS — I130 Hypertensive heart and chronic kidney disease with heart failure and stage 1 through stage 4 chronic kidney disease, or unspecified chronic kidney disease: Principal | ICD-10-CM | POA: Diagnosis present

## 2017-01-25 DIAGNOSIS — Z823 Family history of stroke: Secondary | ICD-10-CM

## 2017-01-25 DIAGNOSIS — I509 Heart failure, unspecified: Secondary | ICD-10-CM

## 2017-01-25 DIAGNOSIS — Z87442 Personal history of urinary calculi: Secondary | ICD-10-CM

## 2017-01-25 DIAGNOSIS — Z86718 Personal history of other venous thrombosis and embolism: Secondary | ICD-10-CM

## 2017-01-25 DIAGNOSIS — Z833 Family history of diabetes mellitus: Secondary | ICD-10-CM

## 2017-01-25 DIAGNOSIS — Z79899 Other long term (current) drug therapy: Secondary | ICD-10-CM

## 2017-01-25 DIAGNOSIS — R0602 Shortness of breath: Secondary | ICD-10-CM

## 2017-01-25 DIAGNOSIS — J449 Chronic obstructive pulmonary disease, unspecified: Secondary | ICD-10-CM | POA: Diagnosis present

## 2017-01-25 DIAGNOSIS — I739 Peripheral vascular disease, unspecified: Secondary | ICD-10-CM | POA: Diagnosis present

## 2017-01-25 DIAGNOSIS — Z8349 Family history of other endocrine, nutritional and metabolic diseases: Secondary | ICD-10-CM

## 2017-01-25 DIAGNOSIS — Z8249 Family history of ischemic heart disease and other diseases of the circulatory system: Secondary | ICD-10-CM

## 2017-01-25 HISTORY — DX: Chronic kidney disease, stage 3 (moderate): N18.3

## 2017-01-25 HISTORY — DX: Permanent atrial fibrillation: I48.21

## 2017-01-25 HISTORY — DX: Nonrheumatic aortic (valve) insufficiency: I35.1

## 2017-01-25 HISTORY — DX: Acute on chronic combined systolic (congestive) and diastolic (congestive) heart failure: I50.43

## 2017-01-25 HISTORY — DX: Chronic combined systolic (congestive) and diastolic (congestive) heart failure: I50.42

## 2017-01-25 LAB — CBC
HEMATOCRIT: 35.7 % — AB (ref 37.5–51.0)
HEMOGLOBIN: 11.3 g/dL — AB (ref 13.0–17.7)
MCH: 29.8 pg (ref 26.6–33.0)
MCHC: 31.7 g/dL (ref 31.5–35.7)
MCV: 94 fL (ref 79–97)
Platelets: 175 10*3/uL (ref 150–379)
RBC: 3.79 x10E6/uL — ABNORMAL LOW (ref 4.14–5.80)
RDW: 16.1 % — ABNORMAL HIGH (ref 12.3–15.4)
WBC: 7.6 10*3/uL (ref 3.4–10.8)

## 2017-01-25 LAB — BASIC METABOLIC PANEL
BUN/Creatinine Ratio: 21 (ref 10–24)
BUN: 34 mg/dL — AB (ref 8–27)
CO2: 22 mmol/L (ref 20–29)
CREATININE: 1.59 mg/dL — AB (ref 0.76–1.27)
Calcium: 9.6 mg/dL (ref 8.6–10.2)
Chloride: 102 mmol/L (ref 96–106)
GFR, EST AFRICAN AMERICAN: 48 mL/min/{1.73_m2} — AB (ref >59)
GFR, EST NON AFRICAN AMERICAN: 42 mL/min/{1.73_m2} — AB (ref >59)
Glucose: 86 mg/dL (ref 65–99)
Potassium: 5.2 mmol/L (ref 3.5–5.2)
Sodium: 139 mmol/L (ref 134–144)

## 2017-01-25 LAB — PRO B NATRIURETIC PEPTIDE: NT-Pro BNP: 7299 pg/mL — ABNORMAL HIGH (ref 0–486)

## 2017-01-25 MED ORDER — RIVAROXABAN 15 MG PO TABS
15.0000 mg | ORAL_TABLET | Freq: Every day | ORAL | Status: DC
  Administered 2017-01-25 – 2017-01-26 (×2): 15 mg via ORAL
  Filled 2017-01-25 (×2): qty 1

## 2017-01-25 MED ORDER — ALBUTEROL SULFATE (2.5 MG/3ML) 0.083% IN NEBU: 3.0000 mL | INHALATION_SOLUTION | Freq: Four times a day (QID) | RESPIRATORY_TRACT | Status: DC | PRN

## 2017-01-25 MED ORDER — POTASSIUM CHLORIDE CRYS ER 20 MEQ PO TBCR
20.0000 meq | EXTENDED_RELEASE_TABLET | Freq: Two times a day (BID) | ORAL | Status: DC
  Administered 2017-01-25 (×2): 20 meq via ORAL
  Filled 2017-01-25 (×3): qty 1

## 2017-01-25 MED ORDER — SODIUM CHLORIDE 0.9% FLUSH
3.0000 mL | Freq: Two times a day (BID) | INTRAVENOUS | Status: DC
  Administered 2017-01-25 – 2017-01-27 (×5): 3 mL via INTRAVENOUS

## 2017-01-25 MED ORDER — ACETAMINOPHEN 325 MG PO TABS: 650.0000 mg | ORAL_TABLET | ORAL | Status: DC | PRN

## 2017-01-25 MED ORDER — ACETAMINOPHEN 500 MG PO TABS: 1000.0000 mg | ORAL_TABLET | Freq: Three times a day (TID) | ORAL | Status: DC | PRN

## 2017-01-25 MED ORDER — ONDANSETRON HCL 4 MG/2ML IJ SOLN: 4.0000 mg | Freq: Four times a day (QID) | INTRAMUSCULAR | Status: DC | PRN

## 2017-01-25 MED ORDER — SODIUM CHLORIDE 0.9% FLUSH: 3.0000 mL | INTRAVENOUS | Status: DC | PRN

## 2017-01-25 MED ORDER — ALLOPURINOL 100 MG PO TABS
100.0000 mg | ORAL_TABLET | Freq: Every day | ORAL | Status: DC
  Administered 2017-01-26 – 2017-01-27 (×2): 100 mg via ORAL
  Filled 2017-01-25 (×2): qty 1

## 2017-01-25 MED ORDER — VITAMIN C 500 MG PO TABS
500.0000 mg | ORAL_TABLET | Freq: Every day | ORAL | Status: DC
  Administered 2017-01-26 – 2017-01-27 (×2): 500 mg via ORAL
  Filled 2017-01-25 (×2): qty 1

## 2017-01-25 MED ORDER — VITAMIN D 1000 UNITS PO TABS
ORAL_TABLET | Freq: Every day | ORAL | Status: DC
  Administered 2017-01-26 – 2017-01-27 (×2): 1000 [IU] via ORAL
  Filled 2017-01-25 (×2): qty 1

## 2017-01-25 MED ORDER — SODIUM CHLORIDE 0.9 % IV SOLN: 250.0000 mL | INTRAVENOUS | Status: DC | PRN

## 2017-01-25 MED ORDER — PRAVASTATIN SODIUM 40 MG PO TABS
40.0000 mg | ORAL_TABLET | Freq: Every day | ORAL | Status: DC
  Administered 2017-01-25 – 2017-01-26 (×2): 40 mg via ORAL
  Filled 2017-01-25 (×2): qty 1

## 2017-01-25 MED ORDER — ENALAPRIL MALEATE 10 MG PO TABS
10.0000 mg | ORAL_TABLET | Freq: Every day | ORAL | Status: DC
  Administered 2017-01-26 – 2017-01-27 (×2): 10 mg via ORAL
  Filled 2017-01-25 (×2): qty 1

## 2017-01-25 MED ORDER — FUROSEMIDE 10 MG/ML IJ SOLN
80.0000 mg | Freq: Two times a day (BID) | INTRAMUSCULAR | Status: DC
  Administered 2017-01-25 – 2017-01-26 (×3): 80 mg via INTRAVENOUS
  Filled 2017-01-25 (×5): qty 8

## 2017-01-25 MED ORDER — METOPROLOL TARTRATE 12.5 MG HALF TABLET
12.5000 mg | ORAL_TABLET | Freq: Two times a day (BID) | ORAL | Status: DC
  Administered 2017-01-25 – 2017-01-27 (×4): 12.5 mg via ORAL
  Filled 2017-01-25 (×4): qty 1

## 2017-01-25 NOTE — H&P (Signed)
Primary Physician: Primary Cardiologist:  Harrington Challenger  Patient admitted for Rx of CHF    HPI: Terry Martinez is a 76 y.o. male with a history of CAD (s/p 6 stents, lasat in 2014 ([place]), PAD, CKD and permanent  atrial fib (rate controlled)  Echo in April LVEF 45 to 50% (visulally appeared better)  Aortic root dilitations  (47 mm)  Mild to moderate AI\    The pt has been followed in clinic during the summer and fall for volume management  Diffcult given renal insufficiency The pt was seen in clinic today  He was seen earlier this fall (November).  He says his weight is up and his breathing is not good Has some wheezing   Gets SOB with activity  Says she is not eating salt        Past Medical History:  Diagnosis Date  . Anemia    hx low iron  . Arthritis    "hands" (2/262018)  . Atrial fibrillation ([place])   . Basal cell carcinoma    "left side of my face"  . Charcot's arthropathy   . CHF (congestive heart failure) ([place])   . Chronic pain   . Constipation   . COPD (chronic obstructive pulmonary disease) ([place])   . Coronary artery disease   . DVT, lower extremity ([place])    many years  . Dyspnea    with exertion  . Dysrhythmia    AFib  . Family history of adverse reaction to anesthesia    sister has difficulty waking up  . Gout   . History of blood transfusion 1960s   "related to being cut up w/barbed wire"  . History of kidney stones   . History of kidney stones   . History of kidney stones   . Hyperlipidemia   . Hypertension   . Incisional hernia    x 2  . Myocardial infarction ([place])    3 stents  . Peripheral artery disease ([place])   . Pneumonia   . 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 Subclavian artery stenosis, left (HCC)    [name] 04/17/2016    Facility-Administered Medications Prior to Admission  Medication Dose Route Frequency Provider Last Rate Last Dose  . 0.9 %  sodium chloride infusion  500 mL Intravenous Continuous Ladene Artist, MD       Medications Prior to Admission  Medication Sig Dispense Refill  . allopurinol (ZYLOPRIM) 100 MG tablet TAKE ONE TABLET BY MOUTH ONCE DAILY IN THE EVENING 90 tablet 2  . allopurinol (ZYLOPRIM) 300 MG tablet TAKE ONE TABLET BY MOUTH ONCE DAILY IN THE MORNING 90 tablet 3  . Ascorbic Acid (VITAMIN C PO) Take 1 tablet by mouth every morning.     . Carboxymethylcellul-Glycerin (LUBRICATING EYE DROPS OP) Apply 1 drop to eye daily as needed (dry eyes).    . Cholecalciferol (VITAMIN D-3 PO) Take 1 tablet by mouth every morning.     . enalapril (VASOTEC) 10 MG tablet TAKE ONE TABLET BY MOUTH ONCE DAILY (Patient taking differently: TAKE 1/2 tablet (5mg ) by mouth twice daily) 90 tablet 1  . furosemide (LASIX) 40 MG tablet Alternate taking 40 mg (1 tablet) and 20 mg (1/2 tablet) once daily. 45 tablet 3  . lovastatin (MEVACOR) 20 MG tablet TAKE ONE TABLET BY MOUTH AT BEDTIME 90 tablet 2  . metoprolol tartrate (LOPRESSOR) 25 MG tablet Take 0.5 tablets (12.5 mg total) by  mouth 2 (two) times daily. 60 tablet 6  . polyethylene glycol powder (GLYCOLAX/MIRALAX) powder DISSOLVE 17G (1 CAPFUL) OF POWDER IN 8 OUNCES OF LIQUID AND DRINK 2 TIMES PER DAY AS NEEDED FOR MILD CONSTIPATION 527 g 11  . Rivaroxaban (XARELTO) 15 MG TABS tablet Take 1 tablet (15 mg total) by mouth daily with supper. 90 tablet 3  . acetaminophen (TYLENOL) 500 MG tablet Take 1,000 mg by mouth every 8 (eight) hours as needed for moderate pain or headache.     . albuterol (PROVENTIL HFA;VENTOLIN HFA) 108 (90 BASE) MCG/ACT inhaler Inhale 2 puffs into the lungs every 6 (six) hours as needed for wheezing or shortness of breath. 1 Inhaler 2  . EPINEPHrine (EPI-PEN) 0.3 mg/0.3 mL SOAJ injection Inject 0.3 mLs (0.3 mg total) into the muscle once. 1 Device 1  . metroNIDAZOLE (METROGEL) 1 % gel Apply topically daily. For rosacea (Patient taking differently: Apply 1 application topically daily as needed (rosacea). ) 45 g 2  . nitroGLYCERIN  (NITROSTAT) 0.4 MG SL tablet Place 1 tablet (0.4 mg total) under the tongue every 5 (five) minutes as needed for chest pain. 90 tablet 3     . [START ON 01/26/2017] allopurinol  100 mg Oral Daily  . [START ON 01/26/2017] cholecalciferol   Oral Daily  . [START ON 01/26/2017] enalapril  10 mg Oral Daily  . furosemide  80 mg Intravenous BID  . metoprolol tartrate  12.5 mg Oral BID  . potassium chloride  20 mEq Oral BID  . pravastatin  40 mg Oral q1800  . Rivaroxaban  15 mg Oral Q supper  . sodium chloride flush  3 mL Intravenous Q12H  . [START ON 01/26/2017] vitamin C  500 mg Oral Daily    Infusions: . sodium chloride      Allergies  Allergen Reactions  . Zocor [Simvastatin] Hives and Rash    Social History   Socioeconomic History  . Marital status: Widowed    Spouse name: Not on file  . Number of children: Not on file  . Years of education: Not on file  . Highest education level: Not on file  Social Needs  . Financial resource strain: Not on file  . Food insecurity - worry: Not on file  . Food insecurity - inability: Not on file  . Transportation needs - medical: Not on file  . Transportation needs - non-medical: Not on file  Occupational History  . Not on file  Tobacco Use  . Smoking status: Former Smoker    Packs/day: 2.50    Years: 46.00    Pack years: 115.00    Types: Cigarettes    Last attempt to quit: 07/22/1998    Years since quitting: 18.5  . Smokeless tobacco: Current User    Types: Snuff  Substance and Sexual Activity  . Alcohol use: No    Alcohol/week: 0.0 oz  . Drug use: No  . Sexual activity: No  Other Topics Concern  . Not on file  Social History Narrative  . Not on file    Family History  Problem Relation Age of Onset  . Diabetes Mother   . Cancer Mother        Right Breast  . Heart disease Mother   . Hyperlipidemia Mother   . Hypertension Mother   . Lymphoma Mother        Chemo  . Lymphoma Father   . Alcohol abuse Sister   . Heart  disease Sister   .  Hyperlipidemia Sister   . Hypertension Sister   . Stroke Sister   . Heart disease Brother   . Depression Brother   . Early death Brother   . Hyperlipidemia Brother   . Hypertension Brother   . Liver cancer Brother   . Heart disease Maternal Grandmother   . Kidney disease Maternal Grandmother   . Heart disease Maternal Grandfather   . Kidney disease Maternal Grandfather   . Emphysema Maternal Grandfather   . Lung cancer Sister     REVIEW OF SYSTEMS:  All systems reviewed  Negative to the above problem except as noted above.    PHYSICAL EXAM: Vitals:   01/25/17 1736 01/25/17 2018  BP: (!) 129/57 (!) 112/48  Pulse: 66 62  Resp: 18 18  Temp: 98.4 F (36.9 C) 98.2 F (36.8 C)  SpO2: 91% 92%     Intake/Output Summary (Last 24 hours) at 01/25/2017 2210 Last data filed at 01/25/2017 2113 Gross per 24 hour  Intake 240 ml  Output 1650 ml  Net -1410 ml    General:  Morbidly obese 76 yo in NAD  HEENT: normal Neck: supple. JVP is increased  . Carotids 2+ bilat; no bruits. No lymphadenopathy or thryomegaly appreciated. Cor: PMI nondisplaced. Regular rate & rhythm. No rubs, gallops or murmurs. Lungs: clear Abdomen: soft, obese. No hepatosplenomegaly. No bruits or masses. Good bowel sounds. Extremities: no cyanosis, clubbing, rash,  1-2+ edema Neuro: alert & oriented x 3, cranial nerves grossly intact. moves all 4 extremities w/o difficulty. Affect pleasant.  ECG:  Pending    Results for orders placed or performed in visit on 01/25/17 (from the past 24 hour(s))  Basic metabolic panel     Status: Abnormal   Collection Time: 01/25/17  9:37 AM  Result Value Ref Range   Glucose 86 65 - 99 mg/dL   BUN 34 (H) 8 - 27 mg/dL   Creatinine, Ser 1.59 (H) 0.76 - 1.27 mg/dL   GFR calc non Af Amer 42 (L) >59 mL/min/1.73   GFR calc Af Amer 48 (L) >59 mL/min/1.73   BUN/Creatinine Ratio 21 10 - 24   Sodium 139 134 - 144 mmol/L   Potassium 5.2 3.5 - 5.2 mmol/L   Chloride  102 96 - 106 mmol/L   CO2 22 20 - 29 mmol/L   Calcium 9.6 8.6 - 10.2 mg/dL   Narrative   Performed at:  [address] [place], [place]  [id number] Lab Director: Rush Farmer MD, Phone:  [phone]  CBC     Status: Abnormal   Collection Time: 01/25/17  9:37 AM  Result Value Ref Range   WBC 7.6 3.4 - 10.8 x10E3/uL   RBC 3.79 (L) 4.14 - 5.80 x10E6/uL   Hemoglobin 11.3 (L) 13.0 - 17.7 g/dL   Hematocrit 35.7 (L) 37.5 - 51.0 %   MCV 94 79 - 97 fL   MCH 29.8 26.6 - 33.0 pg   MCHC 31.7 31.5 - 35.7 g/dL   RDW 16.1 (H) 12.3 - 15.4 %   Platelets 175 150 - 379 x10E3/uL   Narrative   Performed at:  [address] [place], [place]  [id number] Lab Director: Rush Farmer MD, Phone:  [phone]  Pro b natriuretic peptide (BNP)     Status: Abnormal   Collection Time: 01/25/17  9:37 AM  Result Value Ref Range   NT-Pro BNP 7,299 (H) 0 - 486 pg/mL   Narrative   Performed at:  01 -  [facility] [address], [place], [place]  [id number] Lab Director: Rush Farmer MD, Phone:  [phone]   No results found.   ASSESSMENT: 1  Acute on chronic systolic// diastolic CHF   Volume elevated  Wt is up from last visit to clinic  I would recomm admission for IV diuresis  With his renal dysfunction I think this will be extremely difficult to diurese as an outpt  Labs cannot be followed close enough   WOuld benefit from short term IV duresis with close following of labs Repeat echo to reevaluate LVEF   2  CAD  I am not convinced of active angina   Follow  3  Atrial fibrillatoin  Countinue Xarelto   Keep on telemetry 4  Renal insufff  Follow on admit closely  WIll try to get renal to see pt   He has not extablished yet as an outpt.   4  Aortic insuff  Mild to mod on echo in 2015  WIll repeat.      5  PVOD   Followed by Rosalia Hammers chronic infrarenal aortic acclusion  S/p axillobifem bypass 6  HL    On mevacor  Follow  Last LDL 1 year ago was 76

## 2017-01-25 NOTE — Patient Instructions (Addendum)
Dr. Harrington Challenger recommends that you be admitted to the hospital to help manage your symptoms better.  Go to the lab today for BMET, CBC, BNP.  You can go home to wait until a bed is available at [facility].  When one is available, the hospital will call you to come in.  Go through the Main Entrance and to Admitting.

## 2017-01-26 ENCOUNTER — Encounter (HOSPITAL_COMMUNITY): Payer: Self-pay | Admitting: Physician Assistant

## 2017-01-26 ENCOUNTER — Observation Stay (HOSPITAL_BASED_OUTPATIENT_CLINIC_OR_DEPARTMENT_OTHER): Payer: Medicare Other

## 2017-01-26 DIAGNOSIS — I2510 Atherosclerotic heart disease of native coronary artery without angina pectoris: Secondary | ICD-10-CM

## 2017-01-26 DIAGNOSIS — Z7901 Long term (current) use of anticoagulants: Secondary | ICD-10-CM | POA: Diagnosis not present

## 2017-01-26 DIAGNOSIS — Z807 Family history of other malignant neoplasms of lymphoid, hematopoietic and related tissues: Secondary | ICD-10-CM | POA: Diagnosis not present

## 2017-01-26 DIAGNOSIS — Z86718 Personal history of other venous thrombosis and embolism: Secondary | ICD-10-CM | POA: Diagnosis not present

## 2017-01-26 DIAGNOSIS — Z79899 Other long term (current) drug therapy: Secondary | ICD-10-CM | POA: Diagnosis not present

## 2017-01-26 DIAGNOSIS — N183 Chronic kidney disease, stage 3 unspecified: Secondary | ICD-10-CM

## 2017-01-26 DIAGNOSIS — R0602 Shortness of breath: Secondary | ICD-10-CM | POA: Diagnosis not present

## 2017-01-26 DIAGNOSIS — I252 Old myocardial infarction: Secondary | ICD-10-CM | POA: Diagnosis not present

## 2017-01-26 DIAGNOSIS — Z955 Presence of coronary angioplasty implant and graft: Secondary | ICD-10-CM | POA: Diagnosis not present

## 2017-01-26 DIAGNOSIS — I361 Nonrheumatic tricuspid (valve) insufficiency: Secondary | ICD-10-CM

## 2017-01-26 DIAGNOSIS — I351 Nonrheumatic aortic (valve) insufficiency: Secondary | ICD-10-CM

## 2017-01-26 DIAGNOSIS — I482 Chronic atrial fibrillation: Secondary | ICD-10-CM | POA: Diagnosis not present

## 2017-01-26 DIAGNOSIS — Z8349 Family history of other endocrine, nutritional and metabolic diseases: Secondary | ICD-10-CM | POA: Diagnosis not present

## 2017-01-26 DIAGNOSIS — I739 Peripheral vascular disease, unspecified: Secondary | ICD-10-CM | POA: Diagnosis present

## 2017-01-26 DIAGNOSIS — Z6836 Body mass index (BMI) 36.0-36.9, adult: Secondary | ICD-10-CM | POA: Diagnosis not present

## 2017-01-26 DIAGNOSIS — Z87442 Personal history of urinary calculi: Secondary | ICD-10-CM | POA: Diagnosis not present

## 2017-01-26 DIAGNOSIS — E785 Hyperlipidemia, unspecified: Secondary | ICD-10-CM | POA: Diagnosis present

## 2017-01-26 DIAGNOSIS — I5042 Chronic combined systolic (congestive) and diastolic (congestive) heart failure: Secondary | ICD-10-CM | POA: Diagnosis present

## 2017-01-26 DIAGNOSIS — I5043 Acute on chronic combined systolic (congestive) and diastolic (congestive) heart failure: Secondary | ICD-10-CM | POA: Diagnosis not present

## 2017-01-26 DIAGNOSIS — M109 Gout, unspecified: Secondary | ICD-10-CM | POA: Diagnosis present

## 2017-01-26 DIAGNOSIS — Z823 Family history of stroke: Secondary | ICD-10-CM | POA: Diagnosis not present

## 2017-01-26 DIAGNOSIS — I130 Hypertensive heart and chronic kidney disease with heart failure and stage 1 through stage 4 chronic kidney disease, or unspecified chronic kidney disease: Secondary | ICD-10-CM | POA: Diagnosis present

## 2017-01-26 DIAGNOSIS — Z87891 Personal history of nicotine dependence: Secondary | ICD-10-CM | POA: Diagnosis not present

## 2017-01-26 DIAGNOSIS — Z8249 Family history of ischemic heart disease and other diseases of the circulatory system: Secondary | ICD-10-CM | POA: Diagnosis not present

## 2017-01-26 DIAGNOSIS — J449 Chronic obstructive pulmonary disease, unspecified: Secondary | ICD-10-CM | POA: Diagnosis present

## 2017-01-26 DIAGNOSIS — I272 Pulmonary hypertension, unspecified: Secondary | ICD-10-CM | POA: Diagnosis present

## 2017-01-26 DIAGNOSIS — I4821 Permanent atrial fibrillation: Secondary | ICD-10-CM | POA: Diagnosis present

## 2017-01-26 HISTORY — DX: Acute on chronic combined systolic (congestive) and diastolic (congestive) heart failure: I50.43

## 2017-01-26 HISTORY — DX: Nonrheumatic aortic (valve) insufficiency: I35.1

## 2017-01-26 HISTORY — DX: Chronic kidney disease, stage 3 unspecified: N18.30

## 2017-01-26 LAB — BASIC METABOLIC PANEL
ANION GAP: 7 (ref 5–15)
BUN: 36 mg/dL — ABNORMAL HIGH (ref 6–20)
CALCIUM: 9.4 mg/dL (ref 8.9–10.3)
CO2: 28 mmol/L (ref 22–32)
Chloride: 102 mmol/L (ref 101–111)
Creatinine, Ser: 1.71 mg/dL — ABNORMAL HIGH (ref 0.61–1.24)
GFR, EST AFRICAN AMERICAN: 43 mL/min — AB (ref >60)
GFR, EST NON AFRICAN AMERICAN: 37 mL/min — AB (ref >60)
GLUCOSE: 101 mg/dL — AB (ref 65–99)
POTASSIUM: 5.1 mmol/L (ref 3.5–5.1)
Sodium: 137 mmol/L (ref 135–145)

## 2017-01-26 LAB — ECHOCARDIOGRAM COMPLETE
HEIGHTINCHES: 75 in
Weight: 4700.8 oz

## 2017-01-26 LAB — MRSA PCR SCREENING: MRSA BY PCR: NEGATIVE

## 2017-01-26 MED ORDER — PERFLUTREN LIPID MICROSPHERE
1.0000 mL | INTRAVENOUS | Status: AC | PRN
  Administered 2017-01-26: 2 mL via INTRAVENOUS
  Filled 2017-01-26: qty 10

## 2017-01-26 MED ORDER — POTASSIUM CHLORIDE CRYS ER 20 MEQ PO TBCR
20.0000 meq | EXTENDED_RELEASE_TABLET | Freq: Two times a day (BID) | ORAL | Status: DC
  Administered 2017-01-26 (×2): 20 meq via ORAL
  Filled 2017-01-26 (×2): qty 1

## 2017-01-26 NOTE — Progress Notes (Signed)
Progress Note  Patient Name: Terry Martinez Date of Encounter: 01/26/2017  Primary Cardiologist: Dr Harrington Challenger   Subjective   Breathing much better.  Sister-in-law cooks at home, does not add much salt  Thinks dry wt 287 but has not been there for a month or more.  Inpatient Medications    Scheduled Meds: . allopurinol  100 mg Oral Daily  . cholecalciferol   Oral Daily  . enalapril  10 mg Oral Daily  . furosemide  80 mg Intravenous BID  . metoprolol tartrate  12.5 mg Oral BID  . potassium chloride  20 mEq Oral BID  . pravastatin  40 mg Oral q1800  . Rivaroxaban  15 mg Oral Q supper  . sodium chloride flush  3 mL Intravenous Q12H  . vitamin C  500 mg Oral Daily   Continuous Infusions: . sodium chloride     PRN Meds: sodium chloride, acetaminophen, acetaminophen, albuterol, ondansetron (ZOFRAN) IV, sodium chloride flush   Vital Signs    Vitals:   01/25/17 1736 01/25/17 2018 01/26/17 0020 01/26/17 0456  BP: (!) 129/57 (!) 112/48 (!) 135/47 107/63  Pulse: 66 62 63 (!) 56  Resp: 18 18 18 18   Temp: 98.4 F (36.9 C) 98.2 F (36.8 C) 97.7 F (36.5 C) 97.6 F (36.4 C)  TempSrc: Oral Oral Oral Oral  SpO2: 91% 92% 96% 94%  Weight:    293 lb 12.8 oz (133.3 kg)  Height:        Intake/Output Summary (Last 24 hours) at 01/26/2017 0655 Last data filed at 01/26/2017 0457 Gross per 24 hour  Intake 480 ml  Output 2350 ml  Net -1870 ml   Filed Weights   01/25/17 1154 01/26/17 0456  Weight: 297 lb 14.4 oz (135.1 kg) 293 lb 12.8 oz (133.3 kg)    Telemetry    A fib, HR 40s at times overnight, not sustained - Personally Reviewed  ECG    n/a - Personally Reviewed  Physical Exam   GEN: No acute distress.   Neck: JVD difficult to assess 2nd body habitus, is elevated Cardiac: Irreg R&R, 2/6 murmur, no rubs, or gallops.  Respiratory: Bibasilar rales GI: Soft, nontender, non-distended  MS: 1-2+ LE edema; No deformity. Neuro:  Nonfocal  Psych: Normal affect   Labs      Chemistry Recent Labs  Lab 01/25/17 0937 01/26/17 0327  NA 139 137  K 5.2 5.1  CL 102 102  CO2 22 28  GLUCOSE 86 101*  BUN 34* 36*  CREATININE 1.59* 1.71*  CALCIUM 9.6 9.4  GFRNONAA 42* 37*  GFRAA 48* 43*  ANIONGAP  --  7     Hematology Recent Labs  Lab 01/25/17 0937  WBC 7.6  RBC 3.79*  HGB 11.3*  HCT 35.7*  MCV 94  MCH 29.8  MCHC 31.7  RDW 16.1*  PLT 175    BNP Recent Labs  Lab 01/25/17 0937  PROBNP 7,299*     Radiology    None  Cardiac Studies   Echo ordered  Patient Profile     76 y.o. male w/ hx CAD (6 stents, last 2014 at [facility]), PAD, CKD III, perm afib, Xarelto w/ CHA2DS2VASC=5 (age x 2, CAD, HTN, CHF), ICM w/ EF 45-50% echo 05/2016 w/ Ao root 47 mm and mild-mod AI.  Admitted 12/06 w/ volume overload, CHF, DOE.  Assessment & Plan    Principal Problem: 1.  Acute on chronic combined systolic and diastolic CHF (congestive heart failure) ([place]) -  continue diuresis, dry weight 287 per pt - admit wt 297>>293 - I/O Neg 1.8 L so far  Active Problems: 2.  PAD (peripheral artery disease) (HCC) - s/p AoBifem  3.  HTN (hypertension) - BP elevated on admission, improved w/ diuresis  4.  HLD (hyperlipidemia) - on statin  5.  CAD (coronary artery disease) - no CP - on BB, ACE, statin - no ASA 2nd Xarelto  6.  CKD (chronic kidney disease), stage III ([place]) - per Dr Alan Ripper note, get renal consult  7.  Aortic insufficiency - ck echo  8.  Permanent atrial fibrillation ([facility]CC) - rate control   For questions or updates, please contact [place] Please consult [email or web address] for contact info under Cardiology/STEMI.      Signed, Rosaria Ferries, PA-C  01/26/2017, 6:55 AM    Personally seen and examined. Agree with above.  76 year old male admitted with acute on chronic diastolic heart failure in setting of morbid obesity, chronic kidney disease, CAD with permanent atrial fibrillation on Xarelto.  Exam reveals fairly comfortable  laying in bed, mild increased respiratory effort, morbidly obese, lungs with mildly decreased breath sounds at bases, heart irregularly irregular normal rate  Lab work, telemetry personally reviewed  Acute on chronic diastolic heart failure -Continue with IV diuresis.  He may need a few days.  Good overall initial response, approximately 1.5 L.  And he is feeling better.  He was having symptoms of orthopnea, continue to monitor K.  Monitor creatinine.  Morbid obesity -Continue to encourage weight loss.  This is a major risk factor for him to try to tackle to help him with his overall cardiac condition.  Permanent atrial fibrillation -Stable.  Continue Xarelto for anticoagulation.  Candee Furbish, MD

## 2017-01-26 NOTE — Discharge Instructions (Signed)

## 2017-01-26 NOTE — Progress Notes (Signed)
  Echocardiogram 2D Echocardiogram has been performed.  Jennette Dubin 01/26/2017, 12:18 PM

## 2017-01-26 NOTE — Evaluation (Signed)
Physical Therapy Evaluation Patient Details Name: Terry Martinez MRN: [id number] DOB: 03-10-1940 Today's Date: 01/26/2017   History of Present Illness  Pt is a 76 y/o male admitted secondary to CHF exacerbation. PMH includes PAD, CAD s/p stent placement, HTN, CHF, CKD, a fib, COPD, DVT, and MI.   Clinical Impression  Pt admitted secondary to problem above with deficits below. PTA, pt was using rollator for ambulation. Upon eval, pt presenting with decreased strength and balance. Required min guard assist for mobility with RW. Reports his sister and brother in law will be available to assist as needed upon d/c and has all necessary DME. Recommending HHPT at d/c to address mobility deficits. Will continue to follow acutely to maximize functional mobility independence and safety.     Follow Up Recommendations Home health PT;Supervision for mobility/OOB    Equipment Recommendations  None recommended by PT    Recommendations for Other Services       Precautions / Restrictions Precautions Precautions: Fall Restrictions Weight Bearing Restrictions: No      Mobility  Bed Mobility Overal bed mobility: Needs Assistance Bed Mobility: Supine to Sit     Supine to sit: Supervision     General bed mobility comments: Supervision for safety.   Transfers Overall transfer level: Needs assistance Equipment used: Rolling walker (2 wheeled) Transfers: Sit to/from Stand Sit to Stand: Min guard         General transfer comment: Min guard for safety. Verbal cues for safe hand placement.   Ambulation/Gait Ambulation/Gait assistance: Min guard Ambulation Distance (Feet): 50 Feet Assistive device: Rolling walker (2 wheeled) Gait Pattern/deviations: Step-through pattern;Decreased stride length Gait velocity: Decreased  Gait velocity interpretation: Below normal speed for age/gender General Gait Details: Slow, overall steady gait with use of RW. Min guard for safety. Educated about use of RW  at home to increase stability.   Stairs            Wheelchair Mobility    Modified Rankin (Stroke Patients Only)       Balance Overall balance assessment: Needs assistance Sitting-balance support: No upper extremity supported;Feet supported Sitting balance-Leahy Scale: Good     Standing balance support: Bilateral upper extremity supported;During functional activity Standing balance-Leahy Scale: Poor Standing balance comment: Reliant on UE support.                              Pertinent Vitals/Pain Pain Assessment: No/denies pain    Home Living Family/patient expects to be discharged to:: Private residence Living Arrangements: Other (Comment)(sister and brother in law ) Available Help at Discharge: Family;Available 24 hours/day Type of Home: House Home Access: Stairs to enter Entrance Stairs-Rails: Left;Right;Can reach both Entrance Stairs-Number of Steps: 1 Home Layout: One level Home Equipment: Shower seat;Grab bars - tub/shower;Grab bars - toilet;Walker - 4 wheels;Cane - single point;Walker - 2 wheels      Prior Function Level of Independence: Independent with assistive device(s)         Comments: Uses rollator for ambulation      Hand Dominance   Dominant Hand: (ambidextrous )    Extremity/Trunk Assessment   Upper Extremity Assessment Upper Extremity Assessment: LUE deficits/detail;RUE deficits/detail RUE Deficits / Details: Numbness in fingertips  LUE Deficits / Details: Numbness in fingertips     Lower Extremity Assessment Lower Extremity Assessment: Generalized weakness;RLE deficits/detail RLE Deficits / Details: Big toe and second toe amputations     Cervical / Trunk Assessment Cervical / Trunk  Assessment: Kyphotic  Communication   Communication: No difficulties  Cognition Arousal/Alertness: Awake/alert Behavior During Therapy: WFL for tasks assessed/performed Overall Cognitive Status: Within Functional Limits for tasks  assessed                                        General Comments      Exercises     Assessment/Plan    PT Assessment Patient needs continued PT services  PT Problem List Decreased strength;Decreased balance;Decreased mobility;Decreased knowledge of use of DME       PT Treatment Interventions DME instruction;Gait training;Stair training;Therapeutic activities;Functional mobility training;Therapeutic exercise;Balance training;Patient/family education    PT Goals (Current goals can be found in the Care Plan section)  Acute Rehab PT Goals Patient Stated Goal: to go home  PT Goal Formulation: With patient Time For Goal Achievement: 02/09/17 Potential to Achieve Goals: Good    Frequency Min 3X/week   Barriers to discharge        Co-evaluation               AM-PAC PT "6 Clicks" Daily Activity  Outcome Measure Difficulty turning over in bed (including adjusting bedclothes, sheets and blankets)?: None Difficulty moving from lying on back to sitting on the side of the bed? : A Little Difficulty sitting down on and standing up from a chair with arms (e.g., wheelchair, bedside commode, etc,.)?: Unable Help needed moving to and from a bed to chair (including a wheelchair)?: A Little Help needed walking in hospital room?: A Little Help needed climbing 3-5 steps with a railing? : A Little 6 Click Score: 17    End of Session Equipment Utilized During Treatment: Gait belt Activity Tolerance: Patient tolerated treatment well Patient left: in bed;with call bell/phone within reach Nurse Communication: Mobility status(sitting EOB ) PT Visit Diagnosis: Unsteadiness on feet (R26.81);Muscle weakness (generalized) (M62.81)    Time: 3790-2409 PT Time Calculation (min) (ACUTE ONLY): 23 min   Charges:   PT Evaluation $PT Eval Low Complexity: 1 Low PT Treatments $Gait Training: 8-22 mins   PT G Codes:        Leighton Ruff, PT, DPT  Acute Rehabilitation  Services  Pager: [phone]   Rudean Hitt 01/26/2017, 3:50 PM

## 2017-01-26 NOTE — Care Management Note (Signed)
Case Management Note  Patient Details  Name: France Noyce MRN: [id number] Date of Birth: [birth year]  Subjective/Objective:  CHF                 Action/Plan: Patient lives at home with his brother and sister in law; PCP: Lovenia Kim, MD; has private insurance with Medicare / Medicaid with prescription drug coverage; DME - he has a walker and cane at home; patient could benefit from a Disease Management Program for CHF; patient chose [facility]; Tim with [facility] called for arrangements.  Expected Discharge Date:    possibly 01/28/2017              Expected Discharge Plan:  [place]  Discharge planning Services  CM Consult  Choice offered to:  Patient, Sibling  HH Arranged:   HHRN,PT HH Agency:  [facility] (now [facility])  Status of Service:  In process, will continue to follow  Sherrilyn Rist [phone] 01/26/2017, 1:22 PM

## 2017-01-27 ENCOUNTER — Inpatient Hospital Stay (HOSPITAL_COMMUNITY): Payer: Medicare Other

## 2017-01-27 LAB — BASIC METABOLIC PANEL
ANION GAP: 10 (ref 5–15)
BUN: 43 mg/dL — ABNORMAL HIGH (ref 6–20)
CALCIUM: 9.7 mg/dL (ref 8.9–10.3)
CHLORIDE: 96 mmol/L — AB (ref 101–111)
CO2: 30 mmol/L (ref 22–32)
Creatinine, Ser: 1.91 mg/dL — ABNORMAL HIGH (ref 0.61–1.24)
GFR calc non Af Amer: 32 mL/min — ABNORMAL LOW (ref >60)
GFR, EST AFRICAN AMERICAN: 38 mL/min — AB (ref >60)
GLUCOSE: 97 mg/dL (ref 65–99)
POTASSIUM: 5.1 mmol/L (ref 3.5–5.1)
Sodium: 136 mmol/L (ref 135–145)

## 2017-01-27 LAB — BRAIN NATRIURETIC PEPTIDE: B Natriuretic Peptide: 383.5 pg/mL — ABNORMAL HIGH (ref 0.0–100.0)

## 2017-01-27 MED ORDER — TECHNETIUM TC 99M DIETHYLENETRIAME-PENTAACETIC ACID
32.3000 | Freq: Once | INTRAVENOUS | Status: AC | PRN
  Administered 2017-01-27: 32.3 via RESPIRATORY_TRACT

## 2017-01-27 MED ORDER — PRAVASTATIN SODIUM 40 MG PO TABS: 40.0000 mg | ORAL_TABLET | Freq: Every day | ORAL | 5 refills | Status: DC

## 2017-01-27 MED ORDER — TORSEMIDE 20 MG PO TABS: 20.0000 mg | ORAL_TABLET | Freq: Every day | ORAL | 5 refills | Status: DC

## 2017-01-27 MED ORDER — TECHNETIUM TO 99M ALBUMIN AGGREGATED
4.3200 | Freq: Once | INTRAVENOUS | Status: AC | PRN
  Administered 2017-01-27: 4.32 via INTRAVENOUS

## 2017-01-27 NOTE — Progress Notes (Signed)
Discharge instructions given to patient and all questions answered.  

## 2017-01-27 NOTE — Progress Notes (Signed)
I have reviewed VQ results with Dr Thornton Papas Intermed risk study because of matched defect Tehre does not appear to be a PE with mismatched defect Flow changes suggest pulmonary hypertension This agrees with echo findings  Pt comfortable   WOuld keep on anticoagulation Switching to demedex F/U BMET on Tues or Wed OUtpt f/u wk after  Daily wts

## 2017-01-27 NOTE — Discharge Summary (Signed)
Discharge Summary    Patient ID: Terry Martinez,  MRN: [id number], DOB/AGE: 76-Apr-1942 76 y.o.  Admit date: 01/25/2017 Discharge date: 01/27/2017  Primary Care Provider: Lovenia Kim Primary Cardiologist: Dr Harrington Challenger  Discharge Diagnoses    Principal Problem:   Acute on chronic combined systolic and diastolic CHF (congestive heart failure) ([place]) Active Problems:   PAD (peripheral artery disease) (HCC)   HTN (hypertension)   HLD (hyperlipidemia)   CAD (coronary artery disease)   CHF (congestive heart failure) (HCC)   CKD (chronic kidney disease), stage III (HCC)   Aortic insufficiency   Permanent atrial fibrillation (HCC)   Allergies Allergies  Allergen Reactions  . Zocor [Simvastatin] Hives and Rash    Diagnostic Studies/Procedures    Echocardiogram 01/26/17 Study Conclusions - Left ventricle: The cavity size was moderately dilated. Systolic   function was moderately reduced. The estimated ejection fraction   was in the range of 35% to 40%. Wall motion was normal; there   were no regional wall motion abnormalities. - Ventricular septum: The contour showed diastolic flattening and   systolic flattening. - Aortic valve: Valve mobility was restricted. There was very mild   stenosis. There was moderate regurgitation. - Aortic root: The aortic root was dilated measuring 49 mm. - Mitral valve: Calcified annulus. Mildly thickened leaflets .   There was mild regurgitation. - Left atrium: The atrium was severely dilated. - Right ventricle: The cavity size was severely dilated. Wall   thickness was normal. Systolic function was severely reduced. - Right atrium: The atrium was severely dilated. - Tricuspid valve: There was moderate-severe regurgitation. - Pulmonic valve: There was moderate regurgitation. - Pulmonary arteries: Systolic pressure was moderately increased.   PA peak pressure: 45 mm Hg (S). - Inferior vena cava: The vessel was dilated. The respirophasic  diameter changes were blunted (< 50%), consistent with elevated   central venous pressure.  Impressions: - When compared to the prior study from 05/26/2016 LVEF has   decreased, now 35-40%. LV is moderately dilated.   RV is severely dilated with severe systolic dysfunction.   There is biatrial atrial dilatation.   Aortic regurgitation is moderate, aortic root has further dilated   at the sinus level measuring 49 mm, previously 47 mm.   Moderate pulmonary hypertension. _____________   History of Present Illness     Terry Martinez a 76 y.o.malewith a history of CAD (s/p 6 stents, lasat in 2014 ([place]), PAD, CKD and permanent  atrial fib (rate controlled). Echo in April LVEF 45 to 50% (visulally appeared better) Aortic root dilitations (47 mm) Mild to moderate AI.  The pt has been followed in clinic during the summer and fall for volume management which is diffcult given renal insufficiency  The pt was seen in clinic on 01/25/17 with increased weight and increased shortness of breath with activity with some wheezing.  He said that he was not eating salt. He was seen earlier this fall (November). His home management consisted of daily lasix alternating 40 mg with 20 mg.   He was sent to the hospital for management of acute on chronic diastolic heart failure in the setting of morbid obesity, chronic kidney disease, CAD with permanent atrial fibrillation on Xarelto. He was planned for IV diuresis and close monitoring of renal function.    Hospital Course     Consultants: none  The patient thinks his dry wt is around 287 lbs. His wt on admission was 297 lbs. He has been diuresed  with lasix 80 mg IV BID. His wt is down to 285 lbs at discharge. He has had good urine output with net negative 4L fluid balance. He is breathing better.   An echocardiogram showed LV EF decreased to 35-40%, down from 45-50% in 05/2016, with severe RV systolic function and mod pulmonary HTN, although  study was difficult and LV EF may not be down as much as reported, per Dr. Harrington Challenger.  A VQ scan was done to rule out pulmonary embolism as cause for RV dysfunction. This was read as intermediate but after Dr Harrington Challenger reviewed with the radiologist it was felt the pt did not have a PE.   BNP on 01/25/17 was 7299. Rechecked today 383  CKD - creatinine on admission was 1.59 and is up to 1.91 with diuresis. K+ is 5.1 on discharge.   He will be discharged home on torsemide 30 mg daily and follow up for renal function and electrolytes in the office this week. He will need continued encouragement on salt and fluid restriction.   His blood pressure was elevated on admission and improved with diuresis. His atrial fibrillation has been stable.   Patient has been seen by Dr. Harrington Challenger today and deemed ready for discharge home. Lasix was changed to Demadex at discharge. The pt needs a BMP next week and an office visit in two weeks. Discharge medications are listed below. _____________  Discharge Vitals Blood pressure 139/69, pulse (!) 57, temperature (!) 97.5 F (36.4 C), temperature source Oral, resp. rate 20, height 6\' 3"  (1.905 m), weight 285 lb (129.3 kg), SpO2 92 %.  Filed Weights   01/25/17 1154 01/26/17 0456 01/27/17 0500  Weight: 297 lb 14.4 oz (135.1 kg) 293 lb 12.8 oz (133.3 kg) 285 lb (129.3 kg)    Labs & Radiologic Studies    CBC Recent Labs    01/25/17 0937  WBC 7.6  HGB 11.3*  HCT 35.7*  MCV 94  PLT 814   Basic Metabolic Panel Recent Labs    01/26/17 0327 01/27/17 0355  NA 137 136  K 5.1 5.1  CL 102 96*  CO2 28 30  GLUCOSE 101* 97  BUN 36* 43*  CREATININE 1.71* 1.91*  CALCIUM 9.4 9.7   Liver Function Tests No results for input(s): AST, ALT, ALKPHOS, BILITOT, PROT, ALBUMIN in the last 72 hours. No results for input(s): LIPASE, AMYLASE in the last 72 hours. Cardiac Enzymes No results for input(s): CKTOTAL, CKMB, CKMBINDEX, TROPONINI in the last 72 hours. BNP Invalid input(s):  POCBNP D-Dimer No results for input(s): DDIMER in the last 72 hours. Hemoglobin A1C No results for input(s): HGBA1C in the last 72 hours. Fasting Lipid Panel No results for input(s): CHOL, HDL, LDLCALC, TRIG, CHOLHDL, LDLDIRECT in the last 72 hours. Thyroid Function Tests No results for input(s): TSH, T4TOTAL, T3FREE, THYROIDAB in the last 72 hours.  Invalid input(s): FREET3 _____________  Dg Chest 2 View  Result Date: 01/27/2017 CLINICAL DATA:  Shortness of breath, hypertension, coronary disease post MI and PTCA, DVT, CHF, COPD, chronic kidney disease EXAM: CHEST  2 VIEW COMPARISON:  10/20/2016 FINDINGS: Enlargement of cardiac silhouette. Atherosclerotic calcification aorta. Mediastinal contours and pulmonary vascularity normal. Emphysematous and bronchitic changes consistent with COPD. Increased accentuation of interstitial markings in RIGHT lung particularly in the RIGHT upper lobe question pneumonia. Remaining lungs free of acute infiltrate and pleural effusion. No pneumothorax. Bones demineralized with chronic anterior height loss of a vertebra at the thoracolumbar junction. IMPRESSION: Enlargement of cardiac silhouette. COPD changes  with RIGHT upper lobe infiltrate question pneumonia. Electronically Signed   By: Lavonia Dana M.D.   On: 01/27/2017 13:03   Nm Pulmonary Perf And Vent  Result Date: 01/27/2017 CLINICAL DATA:  Wheezing, shortness of breath with activity, suspected pulmonary hypertension, history DVT on Xarelto, former smoker, history coronary artery disease post mile and angioplasty EXAM: NUCLEAR MEDICINE VENTILATION - PERFUSION LUNG SCAN TECHNIQUE: Ventilation images were obtained in multiple projections using inhaled aerosol Tc-80m DTPA. Perfusion images were obtained in multiple projections after intravenous injection of Tc-70m MAA. RADIOPHARMACEUTICALS:  32.3 mCi Technetium-65m DTPA aerosol inhalation and 4.32 mCi Technetium-43m MAA IV COMPARISON:  None Radiographic correlation  01/27/2017 FINDINGS: Ventilation: Mild peripheral irregularity of ventilation in RIGHT upper lobe. Enlargement of cardiac silhouette. Diminished ventilation in LEFT lower lobe. Swallowed aerosol within stomach. Perfusion: Matching irregular diminished perfusion in RIGHT upper lobe. Better perfusion than ventilation in LEFT lower lobe. Enlargement of cardiac silhouette. No additional perfusion defects. On the lateral view, slightly increased perfusion anteriorly than posteriorly; reversal of perfusion gradient suggests pulmonary artery hypertension. Chest radiograph: RIGHT upper lobe infiltrate. Cardiomegaly. COPD changes. IMPRESSION: Matching diminished ventilation and perfusion in RIGHT upper lobe with matching radiographic infiltrates in the RIGHT upper lobe on accompanying chest radiograph. Findings represent an intermediate probability for pulmonary embolism due to presence of triple matched abnormalities. Reversal of the perfusion gradient on the lateral view slightly greater anterior than posterior suggesting pulmonary arterial hypertension. Electronically Signed   By: Lavonia Dana M.D.   On: 01/27/2017 12:55   Disposition   Pt is being discharged home today in good condition.  Follow-up Plans & Appointments    Follow-up Information    [facility] Follow up.   Specialty:  [place] Why:  They will do your home health care at your home Contact information: [place] [address] [phone]        Fay Records, MD Follow up.   Specialty:  Cardiology Why:  The office will call you for a follow up appointment and labs to be checked this week. Please call the office if they do not call you on Monday.  Contact information: [place] [address] [phone]            Discharge Medications   Allergies as of 01/27/2017      Reactions   Zocor [simvastatin] Hives, Rash      Medication List    STOP taking  these medications   furosemide 40 MG tablet Commonly known as:  LASIX   lovastatin 20 MG tablet Commonly known as:  MEVACOR Replaced by:  pravastatin 40 MG tablet     TAKE these medications   acetaminophen 500 MG tablet Commonly known as:  TYLENOL Take 1,000 mg by mouth every 8 (eight) hours as needed for moderate pain or headache.   albuterol 108 (90 Base) MCG/ACT inhaler Commonly known as:  PROVENTIL HFA;VENTOLIN HFA Inhale 2 puffs into the lungs every 6 (six) hours as needed for wheezing or shortness of breath.   allopurinol 300 MG tablet Commonly known as:  ZYLOPRIM TAKE ONE TABLET BY MOUTH ONCE DAILY IN THE MORNING   allopurinol 100 MG tablet Commonly known as:  ZYLOPRIM TAKE ONE TABLET BY MOUTH ONCE DAILY IN THE EVENING   enalapril 10 MG tablet Commonly known as:  VASOTEC TAKE ONE TABLET BY MOUTH ONCE DAILY What changed:    how much to take  how to take  this  when to take this   EPINEPHrine 0.3 mg/0.3 mL Soaj injection Commonly known as:  EPI-PEN Inject 0.3 mLs (0.3 mg total) into the muscle once.   LUBRICATING EYE DROPS OP Apply 1 drop to eye daily as needed (dry eyes).   metoprolol tartrate 25 MG tablet Commonly known as:  LOPRESSOR Take 0.5 tablets (12.5 mg total) by mouth 2 (two) times daily.   metroNIDAZOLE 1 % gel Commonly known as:  METROGEL Apply topically daily. For rosacea What changed:    how much to take  when to take this  reasons to take this  additional instructions   nitroGLYCERIN 0.4 MG SL tablet Commonly known as:  NITROSTAT Place 1 tablet (0.4 mg total) under the tongue every 5 (five) minutes as needed for chest pain.   polyethylene glycol powder powder Commonly known as:  GLYCOLAX/MIRALAX DISSOLVE 17G (1 CAPFUL) OF POWDER IN 8 OUNCES OF LIQUID AND DRINK 2 TIMES PER DAY AS NEEDED FOR MILD CONSTIPATION   pravastatin 40 MG tablet Commonly known as:  PRAVACHOL Take 1 tablet (40 mg total) by mouth daily at 6 PM. Replaces:   lovastatin 20 MG tablet   Rivaroxaban 15 MG Tabs tablet Commonly known as:  XARELTO Take 1 tablet (15 mg total) by mouth daily with supper.   torsemide 20 MG tablet Commonly known as:  DEMADEX Take 1 tablet (20 mg total) by mouth daily.   VITAMIN C PO Take 1 tablet by mouth every morning.   VITAMIN D-3 PO Take 1 tablet by mouth every morning.         Outstanding Labs/Studies   Follow up on renal function and electrolytes with change in diuretics.   Duration of Discharge Encounter   Greater than 30 minutes including physician time.  Angelena Form PA 01/27/2017, 3:03 PM

## 2017-01-27 NOTE — Progress Notes (Signed)
Progress Note  Patient Name: Terry Martinez Date of Encounter: 01/27/2017  Primary Cardiologist: Harrington Challenger   Subjective   Breathihng is much better  No CP    Inpatient Medications    Scheduled Meds: . allopurinol  100 mg Oral Daily  . cholecalciferol   Oral Daily  . enalapril  10 mg Oral Daily  . furosemide  80 mg Intravenous BID  . metoprolol tartrate  12.5 mg Oral BID  . potassium chloride  20 mEq Oral BID  . pravastatin  40 mg Oral q1800  . Rivaroxaban  15 mg Oral Q supper  . sodium chloride flush  3 mL Intravenous Q12H  . vitamin C  500 mg Oral Daily   Continuous Infusions: . sodium chloride     PRN Meds: sodium chloride, acetaminophen, acetaminophen, albuterol, ondansetron (ZOFRAN) IV, sodium chloride flush   Vital Signs    Vitals:   01/26/17 0456 01/26/17 1117 01/26/17 2006 01/27/17 0500  BP: 107/63 117/67 (!) 112/55 (!) 146/70  Pulse: (!) 56 (!) 57 61   Resp: 18  18 18   Temp: 97.6 F (36.4 C)  98.1 F (36.7 C) 97.6 F (36.4 C)  TempSrc: Oral  Oral Oral  SpO2: 94% 95% 93% 93%  Weight: 293 lb 12.8 oz (133.3 kg)   285 lb (129.3 kg)  Height:        Intake/Output Summary (Last 24 hours) at 01/27/2017 0758 Last data filed at 01/27/2017 0501 Gross per 24 hour  Intake 490 ml  Output 3100 ml  Net -2610 ml   Filed Weights   01/25/17 1154 01/26/17 0456 01/27/17 0500  Weight: 297 lb 14.4 oz (135.1 kg) 293 lb 12.8 oz (133.3 kg) 285 lb (129.3 kg)    Telemetry    Afib  60s   - Personally Reviewed  ECG    Physical Exam  Pt in NAD   GEN: No acute distress.   Neck: JVP is increased  Cardiac: RRR, no murmurs, rubs, or gallops.  Respiratory: Clear to auscultation bilaterally. GI: Soft, nontender, non-distended  MS: No edema; No deformity. Neuro:  Nonfocal  Psych: Normal affect   Labs    Chemistry Recent Labs  Lab 01/25/17 0937 01/26/17 0327 01/27/17 0355  NA 139 137 136  K 5.2 5.1 5.1  CL 102 102 96*  CO2 22 28 30   GLUCOSE 86 101* 97  BUN 34*  36* 43*  CREATININE 1.59* 1.71* 1.91*  CALCIUM 9.6 9.4 9.7  GFRNONAA 42* 37* 32*  GFRAA 48* 43* 38*  ANIONGAP  --  7 10     Hematology Recent Labs  Lab 01/25/17 0937  WBC 7.6  RBC 3.79*  HGB 11.3*  HCT 35.7*  MCV 94  MCH 29.8  MCHC 31.7  RDW 16.1*  PLT 175    Cardiac EnzymesNo results for input(s): TROPONINI in the last 168 hours. No results for input(s): TROPIPOC in the last 168 hours.   BNP Recent Labs  Lab 01/25/17 0937  PROBNP 7,299*     DDimer No results for input(s): DDIMER in the last 168 hours.   Radiology    No results found.  Cardiac Studies   ------------------------------------------------------------------- Study Conclusions  - Left ventricle: The cavity size was moderately dilated. Systolic   function was moderately reduced. The estimated ejection fraction   was in the range of 35% to 40%. Wall motion was normal; there   were no regional wall motion abnormalities. - Ventricular septum: The contour showed diastolic flattening and  systolic flattening. - Aortic valve: Valve mobility was restricted. There was very mild   stenosis. There was moderate regurgitation. - Aortic root: The aortic root was dilated measuring 49 mm. - Mitral valve: Calcified annulus. Mildly thickened leaflets .   There was mild regurgitation. - Left atrium: The atrium was severely dilated. - Right ventricle: The cavity size was severely dilated. Wall   thickness was normal. Systolic function was severely reduced. - Right atrium: The atrium was severely dilated. - Tricuspid valve: There was moderate-severe regurgitation. - Pulmonic valve: There was moderate regurgitation. - Pulmonary arteries: Systolic pressure was moderately increased.   PA peak pressure: 45 mm Hg (S). - Inferior vena cava: The vessel was dilated. The respirophasic   diameter changes were blunted (< 50%), consistent with elevated   central venous pressure.  Impressions:  - When compared to the  prior study from 05/26/2016 LVEF has   decreased, now 35-40%. LV is moderately dilated.   RV is severely dilated with severe systolic dysfunction.   There is biatrial atrial dilatation.   Aortic regurgitation is moderate, aortic root has further dilated   at the sinus level measuring 49 mm, previously 47 mm.   Moderate pulmonary hypertension.  Patient Profile     76 y.o. male 76 y.o. male w/ hx CAD (6 stents, last 2014 at [facility]), PAD, CKD III, perm afib, Xarelto w/ CHA2DS2VASC=5 (age x 2, CAD, HTN, CHF), ICM w/ EF 45-50% echo 05/2016 w/ Ao root 47 mm and mild-mod AI.   Assessment & Plan   1  Acute on chronic systolic CHF   I have reiveweed echo from 12/7  RV is dilated and RVEF is down from before  Echo done yesterday  LVEF is down  Very difficult study I am not convinced LVEF is as ow as reported   RV is larger and EF is down some  Pt has diuresed significantly  I would see if he can have a VQ scan to r/o PE  (on Xarelto, hx of DVT)  To explain Switch to torsemide 30 mg daily    Follow wts as outpt   Educate on salt and fluid   If goes home will need an appt this week for BMET   Get BNP today   2  Atrial fib  Rates good  On anticoagulation  3  HTN  BP OK  4  CKD  Cr increased to 1.9  Will switch to oral torsemide  Need to follow as outpt for renal function but may hyave better absorption  5  CAD  I am not convinced of active angina  Woulf follow  Again, LVEF on echo does not appear to be as depressed as noted   Follow as outpt No invasive Rx now.  He is currently on an ACE I  May need t oswitch to some    For questions or updates, please contact [place] Please consult [email or web address] for contact info under Cardiology/STEMI.      Signed, Dorris Carnes, MD  01/27/2017, 7:58 AM

## 2017-02-01 ENCOUNTER — Telehealth: Payer: Self-pay | Admitting: Internal Medicine

## 2017-02-01 ENCOUNTER — Telehealth: Payer: Self-pay | Admitting: Family Medicine

## 2017-02-01 DIAGNOSIS — I5043 Acute on chronic combined systolic (congestive) and diastolic (congestive) heart failure: Secondary | ICD-10-CM

## 2017-02-01 NOTE — Telephone Encounter (Signed)
New message     Patient sister [name] called to schedule appt for this week for patient , there is no opening this week with the APP's , very upset he was not brought into the office due to him having CHF, please advise

## 2017-02-01 NOTE — Telephone Encounter (Signed)
[facility] called and said they did receive orders from dr and wanted to get a notification that Dr Reesa Chew will be the one signing off on the orders since she is a resident. Call them back at [phone] to inform them. Please advise

## 2017-02-01 NOTE — Telephone Encounter (Signed)
Spoke with patient's sister.  Per discharge summary:   "Patient has been seen by Dr. Harrington Challenger today and deemed ready for discharge home. Lasix was changed to Demadex at discharge. The pt needs a BMP next week and an office visit in two weeks."   Pt will come 12/14 for BMET and I have scheduled him with [name], PA-C on 02/06/17 at 1pm.  Per sister pt's weight is stable within 2-3 pounds since discharge.  She understandings to call if 3 pounds increase overnight or 5 pounds in week.

## 2017-02-02 ENCOUNTER — Other Ambulatory Visit: Payer: Medicare Other | Admitting: *Deleted

## 2017-02-02 DIAGNOSIS — I5043 Acute on chronic combined systolic (congestive) and diastolic (congestive) heart failure: Secondary | ICD-10-CM

## 2017-02-02 LAB — BASIC METABOLIC PANEL
BUN/Creatinine Ratio: 26 — ABNORMAL HIGH (ref 10–24)
BUN: 49 mg/dL — AB (ref 8–27)
CALCIUM: 9.9 mg/dL (ref 8.6–10.2)
CHLORIDE: 96 mmol/L (ref 96–106)
CO2: 28 mmol/L (ref 20–29)
Creatinine, Ser: 1.91 mg/dL — ABNORMAL HIGH (ref 0.76–1.27)
GFR calc non Af Amer: 33 mL/min/{1.73_m2} — ABNORMAL LOW (ref >59)
GFR, EST AFRICAN AMERICAN: 38 mL/min/{1.73_m2} — AB (ref >59)
Glucose: 116 mg/dL — ABNORMAL HIGH (ref 65–99)
Potassium: 4.6 mmol/L (ref 3.5–5.2)
Sodium: 138 mmol/L (ref 134–144)

## 2017-02-02 NOTE — Telephone Encounter (Signed)
Will forward to Dr. Reesa Chew. Sendy Pluta,CMA

## 2017-02-02 NOTE — Telephone Encounter (Signed)
[facility] would like to confirm orders received from dr Reesa Chew. They are trying to see the pt on Sunday.

## 2017-02-04 DIAGNOSIS — I130 Hypertensive heart and chronic kidney disease with heart failure and stage 1 through stage 4 chronic kidney disease, or unspecified chronic kidney disease: Secondary | ICD-10-CM | POA: Diagnosis not present

## 2017-02-04 DIAGNOSIS — I5043 Acute on chronic combined systolic (congestive) and diastolic (congestive) heart failure: Secondary | ICD-10-CM | POA: Diagnosis not present

## 2017-02-04 DIAGNOSIS — N183 Chronic kidney disease, stage 3 (moderate): Secondary | ICD-10-CM | POA: Diagnosis not present

## 2017-02-04 DIAGNOSIS — D631 Anemia in chronic kidney disease: Secondary | ICD-10-CM | POA: Diagnosis not present

## 2017-02-04 DIAGNOSIS — E1151 Type 2 diabetes mellitus with diabetic peripheral angiopathy without gangrene: Secondary | ICD-10-CM | POA: Diagnosis not present

## 2017-02-04 DIAGNOSIS — E11621 Type 2 diabetes mellitus with foot ulcer: Secondary | ICD-10-CM | POA: Diagnosis not present

## 2017-02-05 DIAGNOSIS — D631 Anemia in chronic kidney disease: Secondary | ICD-10-CM | POA: Diagnosis not present

## 2017-02-05 DIAGNOSIS — N183 Chronic kidney disease, stage 3 (moderate): Secondary | ICD-10-CM | POA: Diagnosis not present

## 2017-02-05 DIAGNOSIS — E11621 Type 2 diabetes mellitus with foot ulcer: Secondary | ICD-10-CM | POA: Diagnosis not present

## 2017-02-05 DIAGNOSIS — I130 Hypertensive heart and chronic kidney disease with heart failure and stage 1 through stage 4 chronic kidney disease, or unspecified chronic kidney disease: Secondary | ICD-10-CM | POA: Diagnosis not present

## 2017-02-05 DIAGNOSIS — E1151 Type 2 diabetes mellitus with diabetic peripheral angiopathy without gangrene: Secondary | ICD-10-CM | POA: Diagnosis not present

## 2017-02-05 DIAGNOSIS — I5043 Acute on chronic combined systolic (congestive) and diastolic (congestive) heart failure: Secondary | ICD-10-CM | POA: Diagnosis not present

## 2017-02-05 NOTE — Telephone Encounter (Signed)
Verbal orders needed:  2 visits for 3 weeks, 1 visit for 5 weeks and 2 PRN for any abnormal vital signs

## 2017-02-06 ENCOUNTER — Ambulatory Visit (INDEPENDENT_AMBULATORY_CARE_PROVIDER_SITE_OTHER): Payer: Medicare Other | Admitting: Physician Assistant

## 2017-02-06 ENCOUNTER — Encounter: Payer: Self-pay | Admitting: Physician Assistant

## 2017-02-06 VITALS — BP 140/60 | HR 65 | Ht 75.0 in | Wt 285.8 lb

## 2017-02-06 DIAGNOSIS — I10 Essential (primary) hypertension: Secondary | ICD-10-CM | POA: Diagnosis not present

## 2017-02-06 DIAGNOSIS — R05 Cough: Secondary | ICD-10-CM

## 2017-02-06 DIAGNOSIS — I482 Chronic atrial fibrillation: Secondary | ICD-10-CM | POA: Diagnosis not present

## 2017-02-06 DIAGNOSIS — I5042 Chronic combined systolic (congestive) and diastolic (congestive) heart failure: Secondary | ICD-10-CM | POA: Diagnosis not present

## 2017-02-06 DIAGNOSIS — I4821 Permanent atrial fibrillation: Secondary | ICD-10-CM

## 2017-02-06 DIAGNOSIS — N183 Chronic kidney disease, stage 3 unspecified: Secondary | ICD-10-CM

## 2017-02-06 DIAGNOSIS — R059 Cough, unspecified: Secondary | ICD-10-CM

## 2017-02-06 DIAGNOSIS — I779 Disorder of arteries and arterioles, unspecified: Secondary | ICD-10-CM
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
## 2017-02-06 NOTE — Telephone Encounter (Signed)
Verbal orders given  

## 2017-02-06 NOTE — Patient Instructions (Signed)
Medication Instructions:  Your physician recommends that you continue on your current medications as directed. Please refer to the Current Medication list given to you today.   Labwork: February 28, 2017: BMET (same day you are seeing Richardson Dopp PA-C  Testing/Procedures: None ordered  Follow-Up: Your physician recommends that you schedule a follow-up appointment on February 28, 2017 @ 9:45 am   Any Other Special Instructions Will Be Listed Below (If Applicable).     If you need a refill on your cardiac medications before your next appointment, please call your pharmacy.

## 2017-02-08 DIAGNOSIS — E11621 Type 2 diabetes mellitus with foot ulcer: Secondary | ICD-10-CM | POA: Diagnosis not present

## 2017-02-08 DIAGNOSIS — N183 Chronic kidney disease, stage 3 (moderate): Secondary | ICD-10-CM | POA: Diagnosis not present

## 2017-02-08 DIAGNOSIS — D631 Anemia in chronic kidney disease: Secondary | ICD-10-CM | POA: Diagnosis not present

## 2017-02-08 DIAGNOSIS — I130 Hypertensive heart and chronic kidney disease with heart failure and stage 1 through stage 4 chronic kidney disease, or unspecified chronic kidney disease: Secondary | ICD-10-CM | POA: Diagnosis not present

## 2017-02-08 DIAGNOSIS — E1151 Type 2 diabetes mellitus with diabetic peripheral angiopathy without gangrene: Secondary | ICD-10-CM | POA: Diagnosis not present

## 2017-02-08 DIAGNOSIS — I5043 Acute on chronic combined systolic (congestive) and diastolic (congestive) heart failure: Secondary | ICD-10-CM | POA: Diagnosis not present

## 2017-02-11 DIAGNOSIS — E11621 Type 2 diabetes mellitus with foot ulcer: Secondary | ICD-10-CM | POA: Diagnosis not present

## 2017-02-11 DIAGNOSIS — N183 Chronic kidney disease, stage 3 (moderate): Secondary | ICD-10-CM | POA: Diagnosis not present

## 2017-02-11 DIAGNOSIS — I130 Hypertensive heart and chronic kidney disease with heart failure and stage 1 through stage 4 chronic kidney disease, or unspecified chronic kidney disease: Secondary | ICD-10-CM | POA: Diagnosis not present

## 2017-02-11 DIAGNOSIS — I5043 Acute on chronic combined systolic (congestive) and diastolic (congestive) heart failure: Secondary | ICD-10-CM | POA: Diagnosis not present

## 2017-02-11 DIAGNOSIS — D631 Anemia in chronic kidney disease: Secondary | ICD-10-CM | POA: Diagnosis not present

## 2017-02-11 DIAGNOSIS — E1151 Type 2 diabetes mellitus with diabetic peripheral angiopathy without gangrene: Secondary | ICD-10-CM | POA: Diagnosis not present

## 2017-02-15 DIAGNOSIS — I130 Hypertensive heart and chronic kidney disease with heart failure and stage 1 through stage 4 chronic kidney disease, or unspecified chronic kidney disease: Secondary | ICD-10-CM | POA: Diagnosis not present

## 2017-02-15 DIAGNOSIS — E1151 Type 2 diabetes mellitus with diabetic peripheral angiopathy without gangrene: Secondary | ICD-10-CM | POA: Diagnosis not present

## 2017-02-15 DIAGNOSIS — I5043 Acute on chronic combined systolic (congestive) and diastolic (congestive) heart failure: Secondary | ICD-10-CM | POA: Diagnosis not present

## 2017-02-15 DIAGNOSIS — E11621 Type 2 diabetes mellitus with foot ulcer: Secondary | ICD-10-CM | POA: Diagnosis not present

## 2017-02-15 DIAGNOSIS — N183 Chronic kidney disease, stage 3 (moderate): Secondary | ICD-10-CM | POA: Diagnosis not present

## 2017-02-15 DIAGNOSIS — D631 Anemia in chronic kidney disease: Secondary | ICD-10-CM | POA: Diagnosis not present

## 2017-02-20 DIAGNOSIS — I130 Hypertensive heart and chronic kidney disease with heart failure and stage 1 through stage 4 chronic kidney disease, or unspecified chronic kidney disease: Secondary | ICD-10-CM | POA: Diagnosis not present

## 2017-02-20 DIAGNOSIS — D631 Anemia in chronic kidney disease: Secondary | ICD-10-CM | POA: Diagnosis not present

## 2017-02-20 DIAGNOSIS — N183 Chronic kidney disease, stage 3 (moderate): Secondary | ICD-10-CM | POA: Diagnosis not present

## 2017-02-20 DIAGNOSIS — E11621 Type 2 diabetes mellitus with foot ulcer: Secondary | ICD-10-CM | POA: Diagnosis not present

## 2017-02-20 DIAGNOSIS — I5043 Acute on chronic combined systolic (congestive) and diastolic (congestive) heart failure: Secondary | ICD-10-CM | POA: Diagnosis not present

## 2017-02-20 DIAGNOSIS — E1151 Type 2 diabetes mellitus with diabetic peripheral angiopathy without gangrene: Secondary | ICD-10-CM | POA: Diagnosis not present

## 2017-02-22 ENCOUNTER — Other Ambulatory Visit: Payer: Self-pay | Admitting: Family Medicine

## 2017-02-22 DIAGNOSIS — E11621 Type 2 diabetes mellitus with foot ulcer: Secondary | ICD-10-CM | POA: Diagnosis not present

## 2017-02-22 DIAGNOSIS — E1151 Type 2 diabetes mellitus with diabetic peripheral angiopathy without gangrene: Secondary | ICD-10-CM | POA: Diagnosis not present

## 2017-02-22 DIAGNOSIS — I5043 Acute on chronic combined systolic (congestive) and diastolic (congestive) heart failure: Secondary | ICD-10-CM | POA: Diagnosis not present

## 2017-02-22 DIAGNOSIS — I130 Hypertensive heart and chronic kidney disease with heart failure and stage 1 through stage 4 chronic kidney disease, or unspecified chronic kidney disease: Secondary | ICD-10-CM | POA: Diagnosis not present

## 2017-02-22 DIAGNOSIS — D631 Anemia in chronic kidney disease: Secondary | ICD-10-CM | POA: Diagnosis not present

## 2017-02-22 DIAGNOSIS — N183 Chronic kidney disease, stage 3 (moderate): Secondary | ICD-10-CM | POA: Diagnosis not present

## 2017-02-26 ENCOUNTER — Ambulatory Visit (INDEPENDENT_AMBULATORY_CARE_PROVIDER_SITE_OTHER): Payer: Medicare Other | Admitting: Orthopedic Surgery

## 2017-02-26 ENCOUNTER — Encounter (INDEPENDENT_AMBULATORY_CARE_PROVIDER_SITE_OTHER): Payer: Self-pay | Admitting: Orthopedic Surgery

## 2017-02-26 ENCOUNTER — Ambulatory Visit (INDEPENDENT_AMBULATORY_CARE_PROVIDER_SITE_OTHER): Payer: Self-pay

## 2017-02-26 DIAGNOSIS — L97524 Non-pressure chronic ulcer of other part of left foot with necrosis of bone: Secondary | ICD-10-CM

## 2017-02-26 MED ORDER — DOXYCYCLINE HYCLATE 100 MG PO TABS: 100.0000 mg | ORAL_TABLET | Freq: Two times a day (BID) | ORAL | 0 refills | Status: DC
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
## 2017-02-27 DIAGNOSIS — E11621 Type 2 diabetes mellitus with foot ulcer: Secondary | ICD-10-CM | POA: Diagnosis not present

## 2017-02-27 DIAGNOSIS — I130 Hypertensive heart and chronic kidney disease with heart failure and stage 1 through stage 4 chronic kidney disease, or unspecified chronic kidney disease: Secondary | ICD-10-CM | POA: Diagnosis not present

## 2017-02-27 DIAGNOSIS — N183 Chronic kidney disease, stage 3 (moderate): Secondary | ICD-10-CM | POA: Diagnosis not present

## 2017-02-27 DIAGNOSIS — E1151 Type 2 diabetes mellitus with diabetic peripheral angiopathy without gangrene: Secondary | ICD-10-CM | POA: Diagnosis not present

## 2017-02-27 DIAGNOSIS — I5043 Acute on chronic combined systolic (congestive) and diastolic (congestive) heart failure: Secondary | ICD-10-CM | POA: Diagnosis not present

## 2017-02-27 DIAGNOSIS — D631 Anemia in chronic kidney disease: Secondary | ICD-10-CM | POA: Diagnosis not present

## 2017-02-28 ENCOUNTER — Encounter: Payer: Self-pay | Admitting: Physician Assistant

## 2017-02-28 ENCOUNTER — Ambulatory Visit (INDEPENDENT_AMBULATORY_CARE_PROVIDER_SITE_OTHER): Payer: Medicare Other | Admitting: Physician Assistant

## 2017-02-28 ENCOUNTER — Other Ambulatory Visit (INDEPENDENT_AMBULATORY_CARE_PROVIDER_SITE_OTHER): Payer: Self-pay | Admitting: Family

## 2017-02-28 ENCOUNTER — Other Ambulatory Visit: Payer: Medicare Other

## 2017-02-28 ENCOUNTER — Telehealth: Payer: Self-pay | Admitting: *Deleted

## 2017-02-28 VITALS — BP 118/50 | HR 68 | Ht 75.0 in | Wt 281.8 lb

## 2017-02-28 DIAGNOSIS — I482 Chronic atrial fibrillation: Secondary | ICD-10-CM | POA: Diagnosis not present

## 2017-02-28 DIAGNOSIS — N183 Chronic kidney disease, stage 3 unspecified: Secondary | ICD-10-CM

## 2017-02-28 DIAGNOSIS — I4821 Permanent atrial fibrillation: Secondary | ICD-10-CM

## 2017-02-28 DIAGNOSIS — I739 Peripheral vascular disease, unspecified: Secondary | ICD-10-CM

## 2017-02-28 DIAGNOSIS — I5042 Chronic combined systolic (congestive) and diastolic (congestive) heart failure: Secondary | ICD-10-CM

## 2017-02-28 DIAGNOSIS — I351 Nonrheumatic aortic (valve) insufficiency: Secondary | ICD-10-CM

## 2017-02-28 DIAGNOSIS — I2510 Atherosclerotic heart disease of native coronary artery without angina pectoris: Secondary | ICD-10-CM | POA: Diagnosis not present

## 2017-02-28 LAB — BASIC METABOLIC PANEL
BUN/Creatinine Ratio: 25 — ABNORMAL HIGH (ref 10–24)
BUN: 50 mg/dL — AB (ref 8–27)
CALCIUM: 9.3 mg/dL (ref 8.6–10.2)
CO2: 21 mmol/L (ref 20–29)
Chloride: 98 mmol/L (ref 96–106)
Creatinine, Ser: 2.04 mg/dL — ABNORMAL HIGH (ref 0.76–1.27)
GFR calc Af Amer: 36 mL/min/{1.73_m2} — ABNORMAL LOW (ref >59)
GFR, EST NON AFRICAN AMERICAN: 31 mL/min/{1.73_m2} — AB (ref >59)
GLUCOSE: 123 mg/dL — AB (ref 65–99)
Potassium: 4.9 mmol/L (ref 3.5–5.2)
Sodium: 137 mmol/L (ref 134–144)

## 2017-02-28 MED ORDER — TORSEMIDE 10 MG PO TABS: 10.0000 mg | ORAL_TABLET | ORAL | 3 refills | Status: DC

## 2017-02-28 NOTE — Patient Instructions (Addendum)
Medication Instructions:  1. Your physician recommends that you continue on your current medications as directed. Please refer to the Current Medication list given to you today.   Labwork: TODAY BMET  Testing/Procedures: NONE ORDERED TODAY  Follow-Up: May 28, 2017 @ 10:20 WITH DR. ROSS   Any Other Special Instructions Will Be Listed Below (If Applicable).     If you need a refill on your cardiac medications before your next appointment, please call your pharmacy.
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
## 2017-02-28 NOTE — Telephone Encounter (Signed)
DPR ok to s/w pt's sister. Pt's sister has been notified of lab results and medication recommendations to decrease torsemide to 10 mg every Mon, Wed and Fri's with all other days torsemide 20 mg . Pt's sister asked if I would send in torsemide 10 mg so the pt won't have to try and cut the 20 mg tablet. I will send in Rx to [facility]. BMET 03/08/17. Pt's sister thanked me for my call and out help.

## 2017-02-28 NOTE — Telephone Encounter (Signed)
-----   Message from Liliane Shi, [place] sent at 02/28/2017  5:39 PM EST ----- Please call the patient Creatinine increasing.  The potassium is normal. PLAN:  Decrease Torsemide to 10 mg on every Mon, Wed, Fri and take 20 mg all other days. Repeat BMET in 1 week. Richardson Dopp, PA-C    02/28/2017 5:38 PM

## 2017-03-01 ENCOUNTER — Encounter (HOSPITAL_COMMUNITY): Payer: Self-pay | Admitting: *Deleted

## 2017-03-01 ENCOUNTER — Other Ambulatory Visit: Payer: Self-pay

## 2017-03-01 MED ORDER — DEXTROSE 5 % IV SOLN
3.0000 g | INTRAVENOUS | Status: AC
  Administered 2017-03-02: 3 g via INTRAVENOUS
  Filled 2017-03-01: qty 3

## 2017-03-01 NOTE — Progress Notes (Signed)
   03/01/17 1325  OBSTRUCTIVE SLEEP APNEA  Have you ever been diagnosed with sleep apnea through a sleep study? No  Do you snore loudly (loud enough to be heard through closed doors)?  0  Do you often feel tired, fatigued, or sleepy during the daytime (such as falling asleep during driving or talking to someone)? 0  Has anyone observed you stop breathing during your sleep? 0  Do you have, or are you being treated for high blood pressure? 1  BMI more than 35 kg/m2? 1  Age > 50 (1-yes) 1  Neck circumference greater than:Male 16 inches or larger, Male 17inches or larger? 1  Male Gender (Yes=1) 1  Obstructive Sleep Apnea Score 5

## 2017-03-01 NOTE — Progress Notes (Signed)
Pt denies any acute cardiopulmonary issues. Pt under the care of Richardson Dopp, [place], Cardiology. Pt stated that he was advised to take Xarelto the morning of procedure ( per sister per MD). Pt made aware to stop taking otc vitamins, fish oil and herbal medications.  Do not take any NSAIDs ie: Ibuprofen, Advil, Naproxen (Aleve), Motrin, BC and Goody Powder. Pt verbalized understanding of all pre-op instructions. Anesthesia asked to review cardiology note ( EF decreased).

## 2017-03-01 NOTE — Progress Notes (Signed)
Anesthesia Chart Review: SAME DAY WORK-UP.  Patient is a 77 year old male scheduled for left 4th toe amputation on 03/02/17 by Dr. Meridee Score.  History includes former smoker, CAD/MI (RCA stent '05), afib, chronic combined CHF, PAD (s/p left axillobifemoral BPG 10/16/11, Dr. Georgina Quint in [place], [place]; s/p PTFE patch angioplasty of stenosis left axillofemoral bypass 04/18/16), left SCA stent, COPD, HTN, HLD, chronic pain, anemia, DVT (remote), exertional dyspnea, nephrolithiasis, skin cancer (SCC), gout, laparoscopic assisted ventral hernia repair 08/11/15, right great toe amputation 02/19/14 and 1st and 5th right ray amputations 05/08/14, right foot partial excision medial cuneiform 12/15/15. He reports his sister had difficultly waking up after anesthesia.  - Admission 01/25/17-01/27/17 for acute on chronic combined CHF. Per discharge summary, "An echocardiogram showed LV EF decreased to 35-40%, down from 45-50% in 05/2016, with severe RV systolic function and mod pulmonary HTN, although study was difficult and LV EF may not be down as much as reported, per Dr. Harrington Challenger.  A VQ scan was done to rule out pulmonary embolism as cause for RV dysfunction. This was read as intermediate but after Dr Harrington Challenger reviewed with the radiologist it was felt the pt did not have a PE."  - PCP is Dr. Lovenia Kim with [facility]. - Cardiologist is Dr. Dorris Carnes. Last visit 02/28/17 with Richardson Dopp, PA-C. Volume status improved. Continue medical therpy for CAD recommended. He was aware of surgery plans. - Vascular surgeon is Dr. Deitra Mayo.  Meds include albuterol, allopurinol, doxycycline, enalapril, Lopressor, nitroglycerin, pravastatin, Xarelto, torsemide. Per patient's sister, he was advised to continue Xarelto perioperatively.   EKG 02/06/17: Afib at 68 bpm.   Echo 12/718: Study Conclusions - Left ventricle: The cavity size was moderately dilated. Systolic   function was moderately reduced. The estimated  ejection fraction   was in the range of 35% to 40%. Wall motion was normal; there   were no regional wall motion abnormalities. - Ventricular septum: The contour showed diastolic flattening and   systolic flattening. - Aortic valve: Valve mobility was restricted. There was very mild   stenosis. There was moderate regurgitation. - Aortic root: The aortic root was dilated measuring 49 mm. - Mitral valve: Calcified annulus. Mildly thickened leaflets .   There was mild regurgitation. - Left atrium: The atrium was severely dilated. - Right ventricle: The cavity size was severely dilated. Wall   thickness was normal. Systolic function was severely reduced. - Right atrium: The atrium was severely dilated. - Tricuspid valve: There was moderate-severe regurgitation. - Pulmonic valve: There was moderate regurgitation. - Pulmonary arteries: Systolic pressure was moderately increased.   PA peak pressure: 45 mm Hg (S). - Inferior vena cava: The vessel was dilated. The respirophasic   diameter changes were blunted (< 50%), consistent with elevated   central venous pressure. Impressions: - When compared to the prior study from 05/26/2016 LVEF has   decreased, now 35-40%. LV is moderately dilated.   RV is severely dilated with severe systolic dysfunction.   There is biatrial atrial dilatation.   Aortic regurgitation is moderate, aortic root has further dilated   at the sinus level measuring 49 mm, previously 47 mm.   Moderate pulmonary hypertension. (Previous EF 45-50% on 05/26/16.)  Nuclear stress test 05/07/07 [facility]; scanned under Results Review tab): Impression: 1. A mild perfusion defect is seen in the mid inferior and apical inferior regions. This is consistent with an infarct/scar. There is no scintigraphic evidence of inducible myocardial ischemia. 2. The post stress EF  is 56%. 3. Exercise tolerance not tested since this is a pharmacologic study. 4. Diaphragmatic attenuation is  suggested.  CXR 01/27/17: IMPRESSION: Enlargement of cardiac silhouette. COPD changes with RIGHT upper lobe infiltrate question pneumonia.  BMET from 02/28/17 noted. Cr 2.04 (up from 1.91 on 02/02/17). Glucose 123. He is for additional labs on the day of surgery. (Last H/H 11.3/35.7 on 01/25/17.)  Patient is a same day work-up, but was evaluated by cardiology just yesterday. Based on currently available information, I anticipate that he can proceed as planned if no acute CV/CHF changes and afib remains rate controlled.   George Hugh [facility] Short Stay Center/Anesthesiology Phone [phone] 03/01/2017 4:26 PM

## 2017-03-02 ENCOUNTER — Ambulatory Visit (HOSPITAL_COMMUNITY)
Admission: RE | Admit: 2017-03-02 | Discharge: 2017-03-02 | Disposition: A | Discharge status: Home / Self Care | Payer: Medicare Other | Source: Ambulatory Visit | Attending: Orthopedic Surgery | Admitting: Orthopedic Surgery

## 2017-03-02 ENCOUNTER — Encounter (HOSPITAL_COMMUNITY)
Admission: RE | Disposition: A | Discharge status: Home / Self Care | Payer: Self-pay | Source: Ambulatory Visit | Attending: Orthopedic Surgery

## 2017-03-02 ENCOUNTER — Ambulatory Visit (HOSPITAL_COMMUNITY): Payer: Medicare Other | Admitting: Vascular Surgery

## 2017-03-02 ENCOUNTER — Encounter (HOSPITAL_COMMUNITY): Payer: Self-pay | Admitting: *Deleted

## 2017-03-02 DIAGNOSIS — M109 Gout, unspecified: Secondary | ICD-10-CM | POA: Insufficient documentation

## 2017-03-02 DIAGNOSIS — Z79899 Other long term (current) drug therapy: Secondary | ICD-10-CM | POA: Insufficient documentation

## 2017-03-02 DIAGNOSIS — M868X7 Other osteomyelitis, ankle and foot: Secondary | ICD-10-CM | POA: Diagnosis not present

## 2017-03-02 DIAGNOSIS — I2510 Atherosclerotic heart disease of native coronary artery without angina pectoris: Secondary | ICD-10-CM | POA: Diagnosis not present

## 2017-03-02 DIAGNOSIS — M869 Osteomyelitis, unspecified: Secondary | ICD-10-CM | POA: Diagnosis not present

## 2017-03-02 DIAGNOSIS — I130 Hypertensive heart and chronic kidney disease with heart failure and stage 1 through stage 4 chronic kidney disease, or unspecified chronic kidney disease: Secondary | ICD-10-CM | POA: Insufficient documentation

## 2017-03-02 DIAGNOSIS — Z87442 Personal history of urinary calculi: Secondary | ICD-10-CM | POA: Insufficient documentation

## 2017-03-02 DIAGNOSIS — I10 Essential (primary) hypertension: Secondary | ICD-10-CM | POA: Diagnosis not present

## 2017-03-02 DIAGNOSIS — Z955 Presence of coronary angioplasty implant and graft: Secondary | ICD-10-CM | POA: Insufficient documentation

## 2017-03-02 DIAGNOSIS — I5042 Chronic combined systolic (congestive) and diastolic (congestive) heart failure: Secondary | ICD-10-CM | POA: Insufficient documentation

## 2017-03-02 DIAGNOSIS — I252 Old myocardial infarction: Secondary | ICD-10-CM | POA: Insufficient documentation

## 2017-03-02 DIAGNOSIS — Z720 Tobacco use: Secondary | ICD-10-CM | POA: Insufficient documentation

## 2017-03-02 DIAGNOSIS — E1169 Type 2 diabetes mellitus with other specified complication: Secondary | ICD-10-CM | POA: Diagnosis not present

## 2017-03-02 DIAGNOSIS — I482 Chronic atrial fibrillation: Secondary | ICD-10-CM | POA: Diagnosis not present

## 2017-03-02 DIAGNOSIS — E11610 Type 2 diabetes mellitus with diabetic neuropathic arthropathy: Secondary | ICD-10-CM | POA: Diagnosis not present

## 2017-03-02 DIAGNOSIS — J449 Chronic obstructive pulmonary disease, unspecified: Secondary | ICD-10-CM | POA: Diagnosis not present

## 2017-03-02 DIAGNOSIS — Z85828 Personal history of other malignant neoplasm of skin: Secondary | ICD-10-CM | POA: Diagnosis not present

## 2017-03-02 DIAGNOSIS — N183 Chronic kidney disease, stage 3 (moderate): Secondary | ICD-10-CM | POA: Insufficient documentation

## 2017-03-02 DIAGNOSIS — E785 Hyperlipidemia, unspecified: Secondary | ICD-10-CM | POA: Insufficient documentation

## 2017-03-02 DIAGNOSIS — L97529 Non-pressure chronic ulcer of other part of left foot with unspecified severity: Secondary | ICD-10-CM | POA: Insufficient documentation

## 2017-03-02 HISTORY — DX: Cardiac murmur, unspecified: R01.1

## 2017-03-02 HISTORY — PX: AMPUTATION: SHX166

## 2017-03-02 LAB — PROTIME-INR
INR: 1.83
PROTHROMBIN TIME: 21 s — AB (ref 11.4–15.2)

## 2017-03-02 LAB — CBC
HCT: 37.2 % — ABNORMAL LOW (ref 39.0–52.0)
Hemoglobin: 11.8 g/dL — ABNORMAL LOW (ref 13.0–17.0)
MCH: 30.5 pg (ref 26.0–34.0)
MCHC: 31.7 g/dL (ref 30.0–36.0)
MCV: 96.1 fL (ref 78.0–100.0)
PLATELETS: 140 10*3/uL — AB (ref 150–400)
RBC: 3.87 MIL/uL — AB (ref 4.22–5.81)
RDW: 16.4 % — AB (ref 11.5–15.5)
WBC: 7.1 10*3/uL (ref 4.0–10.5)

## 2017-03-02 SURGERY — AMPUTATION DIGIT
Anesthesia: Monitor Anesthesia Care | Laterality: Left

## 2017-03-02 MED ORDER — MIDAZOLAM HCL 2 MG/2ML IJ SOLN
1.0000 mg | Freq: Once | INTRAMUSCULAR | Status: AC
  Administered 2017-03-02: 1 mg via INTRAVENOUS

## 2017-03-02 MED ORDER — MIDAZOLAM HCL 2 MG/2ML IJ SOLN
INTRAMUSCULAR | Status: AC
  Administered 2017-03-02: 1 mg via INTRAVENOUS
  Filled 2017-03-02: qty 2

## 2017-03-02 MED ORDER — ONDANSETRON HCL 4 MG/2ML IJ SOLN
INTRAMUSCULAR | Status: AC
  Filled 2017-03-02: qty 2

## 2017-03-02 MED ORDER — SODIUM CHLORIDE 0.9 % IV SOLN
Freq: Once | INTRAVENOUS | Status: AC
  Administered 2017-03-02: 07:00:00 via INTRAVENOUS

## 2017-03-02 MED ORDER — PROPOFOL 500 MG/50ML IV EMUL
INTRAVENOUS | Status: DC | PRN
  Administered 2017-03-02: 50 ug/kg/min via INTRAVENOUS

## 2017-03-02 MED ORDER — SODIUM CHLORIDE 0.9 % IV SOLN
INTRAVENOUS | Status: DC | PRN
  Administered 2017-03-02: 08:00:00 via INTRAVENOUS

## 2017-03-02 MED ORDER — FENTANYL CITRATE (PF) 250 MCG/5ML IJ SOLN
INTRAMUSCULAR | Status: AC
  Filled 2017-03-02: qty 5

## 2017-03-02 MED ORDER — FENTANYL CITRATE (PF) 100 MCG/2ML IJ SOLN
50.0000 ug | Freq: Once | INTRAMUSCULAR | Status: AC
  Administered 2017-03-02: 50 ug via INTRAVENOUS

## 2017-03-02 MED ORDER — CHLORHEXIDINE GLUCONATE 4 % EX LIQD: 60.0000 mL | Freq: Once | CUTANEOUS | Status: DC

## 2017-03-02 MED ORDER — FENTANYL CITRATE (PF) 100 MCG/2ML IJ SOLN
INTRAMUSCULAR | Status: AC
  Administered 2017-03-02: 50 ug via INTRAVENOUS
  Filled 2017-03-02: qty 2

## 2017-03-02 MED ORDER — PROPOFOL 10 MG/ML IV BOLUS
INTRAVENOUS | Status: DC | PRN
  Administered 2017-03-02: 30 mg via INTRAVENOUS

## 2017-03-02 MED ORDER — PROPOFOL 10 MG/ML IV BOLUS
INTRAVENOUS | Status: AC
  Filled 2017-03-02: qty 20

## 2017-03-02 MED ORDER — EPHEDRINE SULFATE 50 MG/ML IJ SOLN
INTRAMUSCULAR | Status: DC | PRN
  Administered 2017-03-02: 5 mg via INTRAVENOUS

## 2017-03-02 SURGICAL SUPPLY — 27 items
BLADE SURG 21 STRL SS (BLADE) ×3 IMPLANT
BNDG COHESIVE 4X5 TAN STRL (GAUZE/BANDAGES/DRESSINGS) ×3 IMPLANT
BNDG ESMARK 4X9 LF (GAUZE/BANDAGES/DRESSINGS) IMPLANT
BNDG GAUZE ELAST 4 BULKY (GAUZE/BANDAGES/DRESSINGS) ×3 IMPLANT
COVER SURGICAL LIGHT HANDLE (MISCELLANEOUS) ×6 IMPLANT
DRAPE U-SHAPE 47X51 STRL (DRAPES) ×3 IMPLANT
DRSG ADAPTIC 3X8 NADH LF (GAUZE/BANDAGES/DRESSINGS) IMPLANT
DRSG PAD ABDOMINAL 8X10 ST (GAUZE/BANDAGES/DRESSINGS) ×3 IMPLANT
DURAPREP 26ML APPLICATOR (WOUND CARE) ×3 IMPLANT
ELECT REM PT RETURN 9FT ADLT (ELECTROSURGICAL) ×3
ELECTRODE REM PT RTRN 9FT ADLT (ELECTROSURGICAL) ×1 IMPLANT
GAUZE SPONGE 4X4 12PLY STRL (GAUZE/BANDAGES/DRESSINGS) IMPLANT
GLOVE BIOGEL PI IND STRL 9 (GLOVE) ×1 IMPLANT
GLOVE BIOGEL PI INDICATOR 9 (GLOVE) ×2
GLOVE SURG ORTHO 9.0 STRL STRW (GLOVE) ×3 IMPLANT
GOWN STRL REUS W/ TWL XL LVL3 (GOWN DISPOSABLE) ×2 IMPLANT
GOWN STRL REUS W/TWL XL LVL3 (GOWN DISPOSABLE) ×4
KIT BASIN OR (CUSTOM PROCEDURE TRAY) ×3 IMPLANT
KIT ROOM TURNOVER OR (KITS) ×3 IMPLANT
MANIFOLD NEPTUNE II (INSTRUMENTS) ×3 IMPLANT
NEEDLE 22X1 1/2 (OR ONLY) (NEEDLE) IMPLANT
NS IRRIG 1000ML POUR BTL (IV SOLUTION) ×3 IMPLANT
PACK ORTHO EXTREMITY (CUSTOM PROCEDURE TRAY) ×3 IMPLANT
PAD ARMBOARD 7.5X6 YLW CONV (MISCELLANEOUS) ×6 IMPLANT
SUT ETHILON 2 0 PSLX (SUTURE) ×3 IMPLANT
SYR CONTROL 10ML LL (SYRINGE) IMPLANT
TOWEL OR 17X26 10 PK STRL BLUE (TOWEL DISPOSABLE) ×3 IMPLANT

## 2017-03-02 NOTE — Anesthesia Procedure Notes (Signed)
Anesthesia Regional Block: Ankle block   Pre-Anesthetic Checklist: ,, timeout performed, Correct Patient, Correct Site, Correct Laterality, Correct Procedure, Correct Position, site marked, Risks and benefits discussed, Surgical consent,  Pre-op evaluation,  At surgeon's request   Prep: chloraprep       Needles:  Injection technique: Single-shot  Needle Type: Other      Needle Gauge: 22     Additional Needles:   Narrative:  Start time: 03/02/2017 8:45 AM End time: 03/02/2017 8:50 AM Injection made incrementally with aspirations every 5 mL.  Performed by: Personally   Additional Notes: 25 cc 2% lidocaine injected

## 2017-03-02 NOTE — Anesthesia Preprocedure Evaluation (Signed)
Anesthesia Evaluation  Patient identified by MRN, date of birth, ID band Patient awake    Reviewed: Allergy & Precautions, NPO status , Patient's Chart, lab work & pertinent test results  Airway Mallampati: III  TM Distance: >3 FB     Dental  (+) Edentulous Upper, Edentulous Lower   Pulmonary former smoker,    breath sounds clear to auscultation       Cardiovascular hypertension,  Rhythm:Irregular Rate:Normal     Neuro/Psych    GI/Hepatic   Endo/Other    Renal/GU      Musculoskeletal   Abdominal (+) + obese,   Peds  Hematology   Anesthesia Other Findings   Reproductive/Obstetrics                             Anesthesia Physical Anesthesia Plan  ASA: III  Anesthesia Plan: MAC   Post-op Pain Management:    Induction: Intravenous  PONV Risk Score and Plan: Ondansetron and Dexamethasone  Airway Management Planned: Natural Airway and Simple Face Mask  Additional Equipment:   Intra-op Plan:   Post-operative Plan:   Informed Consent: I have reviewed the patients History and Physical, chart, labs and discussed the procedure including the risks, benefits and alternatives for the proposed anesthesia with the patient or authorized representative who has indicated his/her understanding and acceptance.     Plan Discussed with: CRNA and Anesthesiologist  Anesthesia Plan Comments:         Anesthesia Quick Evaluation

## 2017-03-02 NOTE — Anesthesia Postprocedure Evaluation (Signed)
Anesthesia Post Note  Patient: Gifford Ballon.  Procedure(s) Performed: LEFT 4TH TOE AMPUTATION (Left )     Patient location during evaluation: PACU Anesthesia Type: Regional Level of consciousness: awake and alert Pain management: pain level controlled Vital Signs Assessment: post-procedure vital signs reviewed and stable Respiratory status: spontaneous breathing, nonlabored ventilation, respiratory function stable and patient connected to nasal cannula oxygen Cardiovascular status: stable and blood pressure returned to baseline Postop Assessment: no apparent nausea or vomiting Anesthetic complications: no    Last Vitals:  Vitals:   03/02/17 1008 03/02/17 1020  BP: 107/75 114/66  Pulse: (!) 47 (!) 56  Resp: 10 18  Temp: (!) 36.1 C 36.4 C  SpO2: 98% 97%    Last Pain:  Vitals:   03/02/17 0653  TempSrc: Oral                 Ritik Stavola COKER
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
## 2017-03-02 NOTE — Op Note (Signed)
03/02/2017  9:17 AM  PATIENT:  [name]    PRE-OPERATIVE DIAGNOSIS:  Osteomyelitis Left 4th Toe  POST-OPERATIVE DIAGNOSIS:  Same  PROCEDURE:  LEFT 4TH TOE AMPUTATION  SURGEON:  Newt Minion, MD  PHYSICIAN ASSISTANT:None ANESTHESIA:   General  PREOPERATIVE INDICATIONS:  Gaius Ishaq. is a  77 y.o. male with a diagnosis of Osteomyelitis Left 4th Toe who failed conservative measures and elected for surgical management.    The risks benefits and alternatives were discussed with the patient preoperatively including but not limited to the risks of infection, bleeding, nerve injury, cardiopulmonary complications, the need for revision surgery, among others, and the patient was willing to proceed.  OPERATIVE IMPLANTS: None.  OPERATIVE FINDINGS: No abscess at the surgical margins.  OPERATIVE PROCEDURE: Patient was brought the operating room and underwent a regional anesthetic.  After adequate levels of anesthesia were obtained patient's left lower extremity was prepped using DuraPrep draped into a sterile field a timeout was called.  A V incision was made around the toe the toe was amputated through the MTP joint.  Electrocautery was used for hemostasis the wound was irrigated with normal saline.  The incision was closed using 2-0 nylon and a sterile compressive dressing was applied.   DISCHARGE PLANNING:  Antibiotic duration: Perioperatively  Weightbearing: Touchdown weightbearing on the left  Pain medication: Patient states that he will use Tylenol postoperatively  Dressing care/ Wound VAC: Keep dressing clean and dry until follow-up  Ambulatory devices: Walker  Discharge to: Home  Follow-up: In the office 1 week post operative.

## 2017-03-02 NOTE — Progress Notes (Signed)
Orthopedic Tech Progress Note Patient Details:  Terry Martinez. 07-25-1940 [id number]  Ortho Devices Type of Ortho Device: Postop shoe/boot Ortho Device/Splint Location: lle Ortho Device/Splint Interventions: Application   Post Interventions Patient Tolerated: Well Instructions Provided: Care of device   Hildred Priest 03/02/2017, 9:56 AM Viewed order from doctor's order list

## 2017-03-02 NOTE — Transfer of Care (Signed)
Immediate Anesthesia Transfer of Care Note  Patient: [name].  Procedure(s) Performed: LEFT 4TH TOE AMPUTATION (Left )  Patient Location: PACU  Anesthesia Type:MAC and Regional  Level of Consciousness: awake, alert , oriented and patient cooperative  Airway & Oxygen Therapy: Patient Spontanous Breathing and Patient connected to nasal cannula oxygen  Post-op Assessment: Report given to RN and Post -op Vital signs reviewed and stable  Post vital signs: Reviewed and stable  Last Vitals:  Vitals:   03/02/17 0840 03/02/17 0924  BP: (!) 132/57   Pulse: 62   Resp: 11   Temp:  (!) (P) 36.3 C  SpO2: 96%     Last Pain:  Vitals:   03/02/17 0653  TempSrc: Oral      Patients Stated Pain Goal: 3 (32/12/24 8250)  Complications: No apparent anesthesia complications

## 2017-03-03 ENCOUNTER — Encounter (HOSPITAL_COMMUNITY): Payer: Self-pay | Admitting: Orthopedic Surgery

## 2017-03-06 DIAGNOSIS — D631 Anemia in chronic kidney disease: Secondary | ICD-10-CM | POA: Diagnosis not present

## 2017-03-06 DIAGNOSIS — N183 Chronic kidney disease, stage 3 (moderate): Secondary | ICD-10-CM | POA: Diagnosis not present

## 2017-03-06 DIAGNOSIS — I5043 Acute on chronic combined systolic (congestive) and diastolic (congestive) heart failure: Secondary | ICD-10-CM | POA: Diagnosis not present

## 2017-03-06 DIAGNOSIS — I130 Hypertensive heart and chronic kidney disease with heart failure and stage 1 through stage 4 chronic kidney disease, or unspecified chronic kidney disease: Secondary | ICD-10-CM | POA: Diagnosis not present

## 2017-03-06 DIAGNOSIS — E11621 Type 2 diabetes mellitus with foot ulcer: Secondary | ICD-10-CM | POA: Diagnosis not present

## 2017-03-06 DIAGNOSIS — E1151 Type 2 diabetes mellitus with diabetic peripheral angiopathy without gangrene: Secondary | ICD-10-CM | POA: Diagnosis not present

## 2017-03-08 ENCOUNTER — Other Ambulatory Visit: Payer: Medicare Other | Admitting: *Deleted

## 2017-03-08 ENCOUNTER — Encounter (INDEPENDENT_AMBULATORY_CARE_PROVIDER_SITE_OTHER): Payer: Self-pay | Admitting: Orthopedic Surgery

## 2017-03-08 ENCOUNTER — Telehealth: Payer: Self-pay | Admitting: *Deleted

## 2017-03-08 ENCOUNTER — Ambulatory Visit (INDEPENDENT_AMBULATORY_CARE_PROVIDER_SITE_OTHER): Payer: Medicare Other | Admitting: Orthopedic Surgery

## 2017-03-08 DIAGNOSIS — I5042 Chronic combined systolic (congestive) and diastolic (congestive) heart failure: Secondary | ICD-10-CM

## 2017-03-08 DIAGNOSIS — N183 Chronic kidney disease, stage 3 unspecified: Secondary | ICD-10-CM

## 2017-03-08 DIAGNOSIS — Z89422 Acquired absence of other left toe(s): Secondary | ICD-10-CM

## 2017-03-08 LAB — BASIC METABOLIC PANEL
BUN / CREAT RATIO: 21 (ref 10–24)
BUN: 32 mg/dL — ABNORMAL HIGH (ref 8–27)
CHLORIDE: 104 mmol/L (ref 96–106)
CO2: 21 mmol/L (ref 20–29)
Calcium: 9.6 mg/dL (ref 8.6–10.2)
Creatinine, Ser: 1.52 mg/dL — ABNORMAL HIGH (ref 0.76–1.27)
GFR calc Af Amer: 51 mL/min/{1.73_m2} — ABNORMAL LOW (ref >59)
GFR calc non Af Amer: 44 mL/min/{1.73_m2} — ABNORMAL LOW (ref >59)
GLUCOSE: 139 mg/dL — AB (ref 65–99)
POTASSIUM: 5.6 mmol/L — AB (ref 3.5–5.2)
SODIUM: 140 mmol/L (ref 134–144)

## 2017-03-08 MED ORDER — RIVAROXABAN 20 MG PO TABS: 20.0000 mg | ORAL_TABLET | Freq: Every day | ORAL | 3 refills | Status: DC
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
## 2017-03-08 NOTE — Telephone Encounter (Signed)
DPR  Ok to s/w pt's sister [name]. Pt's sister has been notified of pt's lab results and findings. Pt's sister confirms pt does eat a lot of potassium rich foods, banana's, OJ, raisin's. Advised to limit these foods until next lab work 03/22/17. Advised need to increase Xarelto to 20 mg daily, new Rx has been sent in. [name] thanked me for my call and our help.

## 2017-03-08 NOTE — Telephone Encounter (Signed)
-----   Message from Liliane Shi, [place] sent at 03/08/2017  4:38 PM EST ----- Please call the patient Renal function improved.  Estimated Creatinine Clearance: 58.7 mL/min (A) (by C-G formula based on SCr of 1.52 mg/dL (H)). The potassium is elevated. PLAN:  1. If taking K+ supplement >> stop taking 2. Decrease dietary potassium  3. Kidney function is good enough that he should be on Xarelto 20 mg Once daily (CrCl > 50 requires a dose of 20 mg). 4. Increase Xarelto to 20 mg Once daily  5. BMET 2 weeks. Richardson Dopp, PA-C    03/08/2017 4:36 PM

## 2017-03-13 DIAGNOSIS — D631 Anemia in chronic kidney disease: Secondary | ICD-10-CM | POA: Diagnosis not present

## 2017-03-13 DIAGNOSIS — E11621 Type 2 diabetes mellitus with foot ulcer: Secondary | ICD-10-CM | POA: Diagnosis not present

## 2017-03-13 DIAGNOSIS — I130 Hypertensive heart and chronic kidney disease with heart failure and stage 1 through stage 4 chronic kidney disease, or unspecified chronic kidney disease: Secondary | ICD-10-CM | POA: Diagnosis not present

## 2017-03-13 DIAGNOSIS — I5043 Acute on chronic combined systolic (congestive) and diastolic (congestive) heart failure: Secondary | ICD-10-CM | POA: Diagnosis not present

## 2017-03-13 DIAGNOSIS — E1151 Type 2 diabetes mellitus with diabetic peripheral angiopathy without gangrene: Secondary | ICD-10-CM | POA: Diagnosis not present

## 2017-03-13 DIAGNOSIS — N183 Chronic kidney disease, stage 3 (moderate): Secondary | ICD-10-CM | POA: Diagnosis not present

## 2017-03-14 DIAGNOSIS — L219 Seborrheic dermatitis, unspecified: Secondary | ICD-10-CM | POA: Diagnosis not present

## 2017-03-14 DIAGNOSIS — C44311 Basal cell carcinoma of skin of nose: Secondary | ICD-10-CM | POA: Diagnosis not present

## 2017-03-14 DIAGNOSIS — Z85820 Personal history of malignant melanoma of skin: Secondary | ICD-10-CM | POA: Diagnosis not present

## 2017-03-14 DIAGNOSIS — L570 Actinic keratosis: Secondary | ICD-10-CM | POA: Diagnosis not present

## 2017-03-16 ENCOUNTER — Ambulatory Visit (INDEPENDENT_AMBULATORY_CARE_PROVIDER_SITE_OTHER): Payer: Medicare Other | Admitting: Family

## 2017-03-16 ENCOUNTER — Encounter (INDEPENDENT_AMBULATORY_CARE_PROVIDER_SITE_OTHER): Payer: Self-pay | Admitting: Family

## 2017-03-16 VITALS — Ht 75.0 in | Wt 274.0 lb

## 2017-03-16 DIAGNOSIS — Z89422 Acquired absence of other left toe(s): Secondary | ICD-10-CM
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
## 2017-03-22 ENCOUNTER — Other Ambulatory Visit: Payer: Medicare Other | Admitting: *Deleted

## 2017-03-22 DIAGNOSIS — I5043 Acute on chronic combined systolic (congestive) and diastolic (congestive) heart failure: Secondary | ICD-10-CM | POA: Diagnosis not present

## 2017-03-22 DIAGNOSIS — E11621 Type 2 diabetes mellitus with foot ulcer: Secondary | ICD-10-CM | POA: Diagnosis not present

## 2017-03-22 DIAGNOSIS — I130 Hypertensive heart and chronic kidney disease with heart failure and stage 1 through stage 4 chronic kidney disease, or unspecified chronic kidney disease: Secondary | ICD-10-CM | POA: Diagnosis not present

## 2017-03-22 DIAGNOSIS — E1151 Type 2 diabetes mellitus with diabetic peripheral angiopathy without gangrene: Secondary | ICD-10-CM | POA: Diagnosis not present

## 2017-03-22 DIAGNOSIS — D631 Anemia in chronic kidney disease: Secondary | ICD-10-CM | POA: Diagnosis not present

## 2017-03-22 DIAGNOSIS — I10 Essential (primary) hypertension: Secondary | ICD-10-CM | POA: Diagnosis not present

## 2017-03-22 DIAGNOSIS — N183 Chronic kidney disease, stage 3 (moderate): Secondary | ICD-10-CM | POA: Diagnosis not present

## 2017-03-22 LAB — BASIC METABOLIC PANEL
BUN/Creatinine Ratio: 21 (ref 10–24)
BUN: 36 mg/dL — ABNORMAL HIGH (ref 8–27)
CALCIUM: 9.1 mg/dL (ref 8.6–10.2)
CHLORIDE: 104 mmol/L (ref 96–106)
CO2: 19 mmol/L — AB (ref 20–29)
Creatinine, Ser: 1.74 mg/dL — ABNORMAL HIGH (ref 0.76–1.27)
GFR calc Af Amer: 43 mL/min/{1.73_m2} — ABNORMAL LOW (ref >59)
GFR, EST NON AFRICAN AMERICAN: 37 mL/min/{1.73_m2} — AB (ref >59)
GLUCOSE: 120 mg/dL — AB (ref 65–99)
POTASSIUM: 4.1 mmol/L (ref 3.5–5.2)
SODIUM: 138 mmol/L (ref 134–144)

## 2017-03-26 ENCOUNTER — Telehealth: Payer: Self-pay | Admitting: Physician Assistant

## 2017-03-26 NOTE — Telephone Encounter (Signed)
Patient sister [name]) calling,   She states that she is returning a call from Friday in regards to patient labwork

## 2017-03-27 ENCOUNTER — Other Ambulatory Visit: Payer: Self-pay | Admitting: Family Medicine

## 2017-03-27 ENCOUNTER — Other Ambulatory Visit: Payer: Self-pay | Admitting: *Deleted

## 2017-03-27 MED ORDER — ALLOPURINOL 100 MG PO TABS: ORAL_TABLET | ORAL | 2 refills | Status: AC

## 2017-03-27 MED ORDER — ENALAPRIL MALEATE 10 MG PO TABS: 5.0000 mg | ORAL_TABLET | Freq: Two times a day (BID) | ORAL | 0 refills | Status: DC

## 2017-03-27 NOTE — Telephone Encounter (Signed)
Returned pts call and discussed his lab results. See result note. 

## 2017-03-27 NOTE — Telephone Encounter (Signed)
-----   Message from Imogene Burn, PA-C sent at 03/23/2017  8:23 AM EST ----- Kidney function still up some but staying about the same. No changes

## 2017-03-27 NOTE — Telephone Encounter (Signed)
Follow Up:    Returning your call from thsi morning,concerning his lab results.

## 2017-04-03 ENCOUNTER — Ambulatory Visit: Payer: Medicare Other | Admitting: Podiatry

## 2017-04-03 DIAGNOSIS — I739 Peripheral vascular disease, unspecified: Secondary | ICD-10-CM | POA: Diagnosis not present

## 2017-04-03 DIAGNOSIS — I5042 Chronic combined systolic (congestive) and diastolic (congestive) heart failure: Secondary | ICD-10-CM | POA: Diagnosis not present

## 2017-04-03 DIAGNOSIS — E875 Hyperkalemia: Secondary | ICD-10-CM | POA: Diagnosis not present

## 2017-04-03 DIAGNOSIS — N183 Chronic kidney disease, stage 3 (moderate): Secondary | ICD-10-CM | POA: Diagnosis not present

## 2017-04-03 DIAGNOSIS — Z95828 Presence of other vascular implants and grafts: Secondary | ICD-10-CM | POA: Diagnosis not present

## 2017-04-03 DIAGNOSIS — D631 Anemia in chronic kidney disease: Secondary | ICD-10-CM | POA: Diagnosis not present

## 2017-04-06 ENCOUNTER — Encounter (INDEPENDENT_AMBULATORY_CARE_PROVIDER_SITE_OTHER): Payer: Self-pay | Admitting: Family

## 2017-04-06 ENCOUNTER — Ambulatory Visit (INDEPENDENT_AMBULATORY_CARE_PROVIDER_SITE_OTHER): Payer: Medicare Other | Admitting: Family

## 2017-04-06 VITALS — Ht 75.0 in | Wt 274.0 lb

## 2017-04-06 DIAGNOSIS — Z89422 Acquired absence of other left toe(s): Secondary | ICD-10-CM
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
## 2017-04-09 ENCOUNTER — Ambulatory Visit (INDEPENDENT_AMBULATORY_CARE_PROVIDER_SITE_OTHER): Payer: Medicare Other | Admitting: Podiatry

## 2017-04-09 ENCOUNTER — Encounter: Payer: Self-pay | Admitting: Podiatry

## 2017-04-09 ENCOUNTER — Other Ambulatory Visit: Payer: Self-pay | Admitting: Nephrology

## 2017-04-09 DIAGNOSIS — N183 Chronic kidney disease, stage 3 unspecified: Secondary | ICD-10-CM

## 2017-04-09 DIAGNOSIS — I2510 Atherosclerotic heart disease of native coronary artery without angina pectoris: Secondary | ICD-10-CM

## 2017-04-09 DIAGNOSIS — B351 Tinea unguium: Secondary | ICD-10-CM

## 2017-04-09 DIAGNOSIS — M79675 Pain in left toe(s): Secondary | ICD-10-CM

## 2017-04-09 DIAGNOSIS — M79674 Pain in right toe(s): Secondary | ICD-10-CM | POA: Diagnosis not present

## 2017-04-09 DIAGNOSIS — M216X9 Other acquired deformities of unspecified foot: Secondary | ICD-10-CM | POA: Diagnosis not present

## 2017-04-09 DIAGNOSIS — Z89419 Acquired absence of unspecified great toe: Secondary | ICD-10-CM

## 2017-04-09 DIAGNOSIS — I739 Peripheral vascular disease, unspecified: Secondary | ICD-10-CM

## 2017-04-11 NOTE — Progress Notes (Signed)
Subjective: 77 year old male presents the office today requesting inserts as well as shoes.  He relates recently had a left fourth toe amputation is a history of a right first ray amputation as well as fifth toe amputation.  He also states his nails are elongated thickened and he cannot trim himself him to have them trimmed today.  Denies any redness or drainage or swelling from the nail sites.  Denies any open sores. Denies any systemic complaints such as fevers, chills, nausea, vomiting. No acute changes since last appointment, and no other complaints at this time.   Objective: AAO x3, NAD DP/PT pulses palpable but decreased bilaterally, CRT less than 3 seconds Prior left fourth toe as well as right hallux and fifth toe amputation which are all well-healed.  There is no open lesions identified today.  There is prominence of metatarsal heads plantarly.  Hammertoe contractures are present.  Nails are hypertrophic, dystrophic, discolored toes left 1, 2, 3, 5 and right 2, 3, 4.  Mild tenderness palpation the nails.  No surrounding redness or drainage or signs of infection. No open lesions or pre-ulcerative lesions.  No pain with calf compression, swelling, warmth, erythema  Assessment: History of multiple toe amputations requesting inserts, shoes; symptomatic onychomycosis  Plan: -All treatment options discussed with the patient including all alternatives, risks, complications.  -Nails are sharply debrided x7 without any complications or bleeding -We will get him scheduled with Liliane Channel or Benjie Karvonen in order to get measured for inserts, shoes. -Discussed daily foot inspection -Follow-up in 3 months with me or sooner if needed. -Patient encouraged to call the office with any questions, concerns, change in symptoms.    Trula Slade DPM

## 2017-04-13 DIAGNOSIS — I129 Hypertensive chronic kidney disease with stage 1 through stage 4 chronic kidney disease, or unspecified chronic kidney disease: Secondary | ICD-10-CM | POA: Diagnosis not present

## 2017-04-13 DIAGNOSIS — N183 Chronic kidney disease, stage 3 (moderate): Secondary | ICD-10-CM | POA: Diagnosis not present

## 2017-04-17 ENCOUNTER — Other Ambulatory Visit: Payer: Medicare Other

## 2017-04-20 ENCOUNTER — Other Ambulatory Visit: Payer: Self-pay

## 2017-04-20 DIAGNOSIS — I739 Peripheral vascular disease, unspecified: Secondary | ICD-10-CM

## 2017-04-20 DIAGNOSIS — I129 Hypertensive chronic kidney disease with stage 1 through stage 4 chronic kidney disease, or unspecified chronic kidney disease: Secondary | ICD-10-CM | POA: Diagnosis not present

## 2017-04-20 DIAGNOSIS — E875 Hyperkalemia: Secondary | ICD-10-CM | POA: Diagnosis not present

## 2017-04-20 DIAGNOSIS — I5042 Chronic combined systolic (congestive) and diastolic (congestive) heart failure: Secondary | ICD-10-CM | POA: Diagnosis not present

## 2017-04-20 DIAGNOSIS — N183 Chronic kidney disease, stage 3 (moderate): Secondary | ICD-10-CM | POA: Diagnosis not present

## 2017-04-20 DIAGNOSIS — D631 Anemia in chronic kidney disease: Secondary | ICD-10-CM | POA: Diagnosis not present

## 2017-04-20 DIAGNOSIS — Z95828 Presence of other vascular implants and grafts: Secondary | ICD-10-CM | POA: Diagnosis not present

## 2017-05-01 ENCOUNTER — Telehealth: Payer: Self-pay | Admitting: Podiatry

## 2017-05-02 ENCOUNTER — Encounter (HOSPITAL_COMMUNITY): Payer: Self-pay | Admitting: *Deleted

## 2017-05-02 ENCOUNTER — Other Ambulatory Visit: Payer: Self-pay

## 2017-05-02 ENCOUNTER — Observation Stay (HOSPITAL_COMMUNITY): Payer: Medicare Other

## 2017-05-02 ENCOUNTER — Inpatient Hospital Stay (HOSPITAL_COMMUNITY)
Admission: EM | Admit: 2017-05-02 | Discharge: 2017-05-13 | DRG: 291 | Disposition: A | Discharge status: Home Health | Payer: Medicare Other | Attending: Internal Medicine | Admitting: Internal Medicine

## 2017-05-02 ENCOUNTER — Emergency Department (HOSPITAL_COMMUNITY): Payer: Medicare Other

## 2017-05-02 DIAGNOSIS — Z79899 Other long term (current) drug therapy: Secondary | ICD-10-CM

## 2017-05-02 DIAGNOSIS — E785 Hyperlipidemia, unspecified: Secondary | ICD-10-CM | POA: Diagnosis present

## 2017-05-02 DIAGNOSIS — N183 Chronic kidney disease, stage 3 (moderate): Secondary | ICD-10-CM | POA: Diagnosis present

## 2017-05-02 DIAGNOSIS — R0602 Shortness of breath: Secondary | ICD-10-CM | POA: Diagnosis not present

## 2017-05-02 DIAGNOSIS — Z7982 Long term (current) use of aspirin: Secondary | ICD-10-CM

## 2017-05-02 DIAGNOSIS — J441 Chronic obstructive pulmonary disease with (acute) exacerbation: Secondary | ICD-10-CM | POA: Diagnosis not present

## 2017-05-02 DIAGNOSIS — I509 Heart failure, unspecified: Secondary | ICD-10-CM | POA: Diagnosis not present

## 2017-05-02 DIAGNOSIS — Z7901 Long term (current) use of anticoagulants: Secondary | ICD-10-CM

## 2017-05-02 DIAGNOSIS — Z955 Presence of coronary angioplasty implant and graft: Secondary | ICD-10-CM

## 2017-05-02 DIAGNOSIS — N179 Acute kidney failure, unspecified: Secondary | ICD-10-CM | POA: Diagnosis present

## 2017-05-02 DIAGNOSIS — K5900 Constipation, unspecified: Secondary | ICD-10-CM | POA: Diagnosis present

## 2017-05-02 DIAGNOSIS — I5023 Acute on chronic systolic (congestive) heart failure: Secondary | ICD-10-CM | POA: Diagnosis not present

## 2017-05-02 DIAGNOSIS — I110 Hypertensive heart disease with heart failure: Secondary | ICD-10-CM | POA: Diagnosis not present

## 2017-05-02 DIAGNOSIS — N184 Chronic kidney disease, stage 4 (severe): Secondary | ICD-10-CM

## 2017-05-02 DIAGNOSIS — Z85828 Personal history of other malignant neoplasm of skin: Secondary | ICD-10-CM

## 2017-05-02 DIAGNOSIS — M1990 Unspecified osteoarthritis, unspecified site: Secondary | ICD-10-CM | POA: Diagnosis present

## 2017-05-02 DIAGNOSIS — Z87891 Personal history of nicotine dependence: Secondary | ICD-10-CM

## 2017-05-02 DIAGNOSIS — N2889 Other specified disorders of kidney and ureter: Secondary | ICD-10-CM

## 2017-05-02 DIAGNOSIS — I252 Old myocardial infarction: Secondary | ICD-10-CM

## 2017-05-02 DIAGNOSIS — I129 Hypertensive chronic kidney disease with stage 1 through stage 4 chronic kidney disease, or unspecified chronic kidney disease: Secondary | ICD-10-CM | POA: Diagnosis not present

## 2017-05-02 DIAGNOSIS — Z6837 Body mass index (BMI) 37.0-37.9, adult: Secondary | ICD-10-CM

## 2017-05-02 DIAGNOSIS — E876 Hypokalemia: Secondary | ICD-10-CM | POA: Diagnosis present

## 2017-05-02 DIAGNOSIS — I2510 Atherosclerotic heart disease of native coronary artery without angina pectoris: Secondary | ICD-10-CM | POA: Diagnosis present

## 2017-05-02 DIAGNOSIS — I739 Peripheral vascular disease, unspecified: Secondary | ICD-10-CM | POA: Diagnosis present

## 2017-05-02 DIAGNOSIS — Z95828 Presence of other vascular implants and grafts: Secondary | ICD-10-CM | POA: Diagnosis not present

## 2017-05-02 DIAGNOSIS — E669 Obesity, unspecified: Secondary | ICD-10-CM | POA: Diagnosis present

## 2017-05-02 DIAGNOSIS — Z888 Allergy status to other drugs, medicaments and biological substances status: Secondary | ICD-10-CM

## 2017-05-02 DIAGNOSIS — I5043 Acute on chronic combined systolic (congestive) and diastolic (congestive) heart failure: Secondary | ICD-10-CM | POA: Diagnosis present

## 2017-05-02 DIAGNOSIS — E875 Hyperkalemia: Secondary | ICD-10-CM | POA: Diagnosis not present

## 2017-05-02 DIAGNOSIS — Z89431 Acquired absence of right foot: Secondary | ICD-10-CM

## 2017-05-02 DIAGNOSIS — M25512 Pain in left shoulder: Secondary | ICD-10-CM | POA: Diagnosis not present

## 2017-05-02 DIAGNOSIS — Z86718 Personal history of other venous thrombosis and embolism: Secondary | ICD-10-CM

## 2017-05-02 DIAGNOSIS — M19012 Primary osteoarthritis, left shoulder: Secondary | ICD-10-CM | POA: Diagnosis not present

## 2017-05-02 DIAGNOSIS — Z8249 Family history of ischemic heart disease and other diseases of the circulatory system: Secondary | ICD-10-CM

## 2017-05-02 DIAGNOSIS — I493 Ventricular premature depolarization: Secondary | ICD-10-CM | POA: Diagnosis present

## 2017-05-02 DIAGNOSIS — Z89422 Acquired absence of other left toe(s): Secondary | ICD-10-CM

## 2017-05-02 DIAGNOSIS — I482 Chronic atrial fibrillation: Secondary | ICD-10-CM | POA: Diagnosis present

## 2017-05-02 DIAGNOSIS — I351 Nonrheumatic aortic (valve) insufficiency: Secondary | ICD-10-CM | POA: Diagnosis present

## 2017-05-02 DIAGNOSIS — R339 Retention of urine, unspecified: Secondary | ICD-10-CM | POA: Diagnosis present

## 2017-05-02 DIAGNOSIS — I130 Hypertensive heart and chronic kidney disease with heart failure and stage 1 through stage 4 chronic kidney disease, or unspecified chronic kidney disease: Secondary | ICD-10-CM | POA: Diagnosis not present

## 2017-05-02 DIAGNOSIS — I5042 Chronic combined systolic (congestive) and diastolic (congestive) heart failure: Secondary | ICD-10-CM | POA: Diagnosis not present

## 2017-05-02 DIAGNOSIS — I272 Pulmonary hypertension, unspecified: Secondary | ICD-10-CM | POA: Diagnosis present

## 2017-05-02 DIAGNOSIS — D631 Anemia in chronic kidney disease: Secondary | ICD-10-CM | POA: Diagnosis not present

## 2017-05-02 LAB — BASIC METABOLIC PANEL
Anion gap: 10 (ref 5–15)
BUN: 44 mg/dL — ABNORMAL HIGH (ref 6–20)
CHLORIDE: 106 mmol/L (ref 101–111)
CO2: 19 mmol/L — ABNORMAL LOW (ref 22–32)
Calcium: 8.8 mg/dL — ABNORMAL LOW (ref 8.9–10.3)
Creatinine, Ser: 2.51 mg/dL — ABNORMAL HIGH (ref 0.61–1.24)
GFR calc non Af Amer: 23 mL/min — ABNORMAL LOW (ref >60)
GFR, EST AFRICAN AMERICAN: 27 mL/min — AB (ref >60)
Glucose, Bld: 133 mg/dL — ABNORMAL HIGH (ref 65–99)
Potassium: 3.3 mmol/L — ABNORMAL LOW (ref 3.5–5.1)
SODIUM: 135 mmol/L (ref 135–145)

## 2017-05-02 LAB — CBC
HEMATOCRIT: 34.4 % — AB (ref 39.0–52.0)
Hemoglobin: 11 g/dL — ABNORMAL LOW (ref 13.0–17.0)
MCH: 31.2 pg (ref 26.0–34.0)
MCHC: 32 g/dL (ref 30.0–36.0)
MCV: 97.5 fL (ref 78.0–100.0)
Platelets: 136 10*3/uL — ABNORMAL LOW (ref 150–400)
RBC: 3.53 MIL/uL — AB (ref 4.22–5.81)
RDW: 18.3 % — ABNORMAL HIGH (ref 11.5–15.5)
WBC: 6.3 10*3/uL (ref 4.0–10.5)

## 2017-05-02 LAB — I-STAT TROPONIN, ED
TROPONIN I, POC: 0 ng/mL (ref 0.00–0.08)
Troponin i, poc: 0.01 ng/mL (ref 0.00–0.08)

## 2017-05-02 LAB — BRAIN NATRIURETIC PEPTIDE: B NATRIURETIC PEPTIDE 5: 723.6 pg/mL — AB (ref 0.0–100.0)

## 2017-05-02 MED ORDER — ONDANSETRON HCL 4 MG/2ML IJ SOLN: 4.0000 mg | Freq: Four times a day (QID) | INTRAMUSCULAR | Status: DC | PRN

## 2017-05-02 MED ORDER — POTASSIUM CHLORIDE CRYS ER 20 MEQ PO TBCR
20.0000 meq | EXTENDED_RELEASE_TABLET | Freq: Once | ORAL | Status: AC
  Administered 2017-05-02: 20 meq via ORAL
  Filled 2017-05-02: qty 1

## 2017-05-02 MED ORDER — ACETAMINOPHEN 500 MG PO TABS
1000.0000 mg | ORAL_TABLET | Freq: Three times a day (TID) | ORAL | Status: DC | PRN
  Administered 2017-05-03 – 2017-05-12 (×7): 1000 mg via ORAL
  Filled 2017-05-02 (×7): qty 2

## 2017-05-02 MED ORDER — ASPIRIN EC 81 MG PO TBEC
81.0000 mg | DELAYED_RELEASE_TABLET | Freq: Every day | ORAL | Status: DC
  Administered 2017-05-03 – 2017-05-13 (×11): 81 mg via ORAL
  Filled 2017-05-02 (×11): qty 1

## 2017-05-02 MED ORDER — CARBOXYMETHYLCELLUL-GLYCERIN 0.5-0.9 % OP SOLN: 2.0000 [drp] | Freq: Four times a day (QID) | OPHTHALMIC | Status: DC | PRN

## 2017-05-02 MED ORDER — POLYETHYLENE GLYCOL 3350 17 G PO PACK
17.0000 g | PACK | Freq: Two times a day (BID) | ORAL | Status: DC | PRN
  Administered 2017-05-02 – 2017-05-04 (×3): 17 g via ORAL
  Filled 2017-05-02 (×3): qty 1

## 2017-05-02 MED ORDER — METOPROLOL TARTRATE 12.5 MG HALF TABLET
12.5000 mg | ORAL_TABLET | Freq: Two times a day (BID) | ORAL | Status: DC
  Administered 2017-05-02: 12.5 mg via ORAL
  Filled 2017-05-02 (×2): qty 1

## 2017-05-02 MED ORDER — PRAVASTATIN SODIUM 40 MG PO TABS
40.0000 mg | ORAL_TABLET | Freq: Every day | ORAL | Status: DC
  Administered 2017-05-02 – 2017-05-12 (×11): 40 mg via ORAL
  Filled 2017-05-02 (×11): qty 1

## 2017-05-02 MED ORDER — RIVAROXABAN 15 MG PO TABS: 15.0000 mg | ORAL_TABLET | Freq: Every day | ORAL | Status: DC

## 2017-05-02 MED ORDER — ALBUTEROL SULFATE (2.5 MG/3ML) 0.083% IN NEBU: 2.5000 mg | INHALATION_SOLUTION | Freq: Four times a day (QID) | RESPIRATORY_TRACT | Status: DC | PRN

## 2017-05-02 MED ORDER — FUROSEMIDE 10 MG/ML IJ SOLN
60.0000 mg | Freq: Once | INTRAMUSCULAR | Status: AC
  Administered 2017-05-02: 60 mg via INTRAVENOUS
  Filled 2017-05-02: qty 6

## 2017-05-02 MED ORDER — AMLODIPINE BESYLATE 5 MG PO TABS
5.0000 mg | ORAL_TABLET | Freq: Every day | ORAL | Status: DC
  Administered 2017-05-02: 5 mg via ORAL
  Filled 2017-05-02: qty 1

## 2017-05-02 MED ORDER — FUROSEMIDE 10 MG/ML IJ SOLN
60.0000 mg | Freq: Two times a day (BID) | INTRAMUSCULAR | Status: DC
  Administered 2017-05-03 – 2017-05-04 (×3): 60 mg via INTRAVENOUS
  Filled 2017-05-02 (×3): qty 6

## 2017-05-02 MED ORDER — RIVAROXABAN 15 MG PO TABS
15.0000 mg | ORAL_TABLET | Freq: Every day | ORAL | Status: DC
  Administered 2017-05-02 – 2017-05-03 (×2): 15 mg via ORAL
  Filled 2017-05-02 (×2): qty 1

## 2017-05-02 MED ORDER — ALLOPURINOL 100 MG PO TABS
100.0000 mg | ORAL_TABLET | Freq: Every day | ORAL | Status: DC
  Administered 2017-05-03 – 2017-05-13 (×11): 100 mg via ORAL
  Filled 2017-05-02 (×12): qty 1

## 2017-05-02 MED ORDER — ALLOPURINOL 300 MG PO TABS
300.0000 mg | ORAL_TABLET | Freq: Every day | ORAL | Status: DC
  Filled 2017-05-02: qty 1

## 2017-05-02 MED ORDER — POLYETHYLENE GLYCOL 3350 17 GM/SCOOP PO POWD: 1.0000 | Freq: Two times a day (BID) | ORAL | Status: DC | PRN

## 2017-05-02 MED ORDER — ALLOPURINOL 100 MG PO TABS: 100.0000 mg | ORAL_TABLET | ORAL | Status: DC

## 2017-05-02 MED ORDER — NITROGLYCERIN 0.4 MG SL SUBL: 0.4000 mg | SUBLINGUAL_TABLET | SUBLINGUAL | Status: DC | PRN

## 2017-05-02 NOTE — ED Triage Notes (Signed)
Pt reports having increase in swelling to legs and abd x 2 weeks. Had increase in lasix dosage but still no relief. Has sob with mild exertion and pain to left shoulder. Hx of chf.

## 2017-05-02 NOTE — ED Notes (Signed)
Patient transported to X-ray 
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
## 2017-05-03 DIAGNOSIS — I5043 Acute on chronic combined systolic (congestive) and diastolic (congestive) heart failure: Secondary | ICD-10-CM | POA: Diagnosis not present

## 2017-05-03 DIAGNOSIS — E669 Obesity, unspecified: Secondary | ICD-10-CM | POA: Diagnosis not present

## 2017-05-03 DIAGNOSIS — I272 Pulmonary hypertension, unspecified: Secondary | ICD-10-CM | POA: Diagnosis present

## 2017-05-03 DIAGNOSIS — I351 Nonrheumatic aortic (valve) insufficiency: Secondary | ICD-10-CM | POA: Diagnosis present

## 2017-05-03 DIAGNOSIS — I481 Persistent atrial fibrillation: Secondary | ICD-10-CM | POA: Diagnosis not present

## 2017-05-03 DIAGNOSIS — E785 Hyperlipidemia, unspecified: Secondary | ICD-10-CM | POA: Diagnosis present

## 2017-05-03 DIAGNOSIS — M25512 Pain in left shoulder: Secondary | ICD-10-CM | POA: Diagnosis present

## 2017-05-03 DIAGNOSIS — I482 Chronic atrial fibrillation: Secondary | ICD-10-CM | POA: Diagnosis not present

## 2017-05-03 DIAGNOSIS — N183 Chronic kidney disease, stage 3 (moderate): Secondary | ICD-10-CM

## 2017-05-03 DIAGNOSIS — Z7901 Long term (current) use of anticoagulants: Secondary | ICD-10-CM | POA: Diagnosis not present

## 2017-05-03 DIAGNOSIS — I5023 Acute on chronic systolic (congestive) heart failure: Secondary | ICD-10-CM | POA: Diagnosis not present

## 2017-05-03 DIAGNOSIS — R0602 Shortness of breath: Secondary | ICD-10-CM | POA: Diagnosis not present

## 2017-05-03 DIAGNOSIS — I509 Heart failure, unspecified: Secondary | ICD-10-CM | POA: Diagnosis not present

## 2017-05-03 DIAGNOSIS — R05 Cough: Secondary | ICD-10-CM | POA: Diagnosis not present

## 2017-05-03 DIAGNOSIS — I493 Ventricular premature depolarization: Secondary | ICD-10-CM | POA: Diagnosis not present

## 2017-05-03 DIAGNOSIS — K5900 Constipation, unspecified: Secondary | ICD-10-CM | POA: Diagnosis present

## 2017-05-03 DIAGNOSIS — I130 Hypertensive heart and chronic kidney disease with heart failure and stage 1 through stage 4 chronic kidney disease, or unspecified chronic kidney disease: Secondary | ICD-10-CM | POA: Diagnosis not present

## 2017-05-03 DIAGNOSIS — N179 Acute kidney failure, unspecified: Secondary | ICD-10-CM

## 2017-05-03 DIAGNOSIS — R339 Retention of urine, unspecified: Secondary | ICD-10-CM | POA: Diagnosis present

## 2017-05-03 DIAGNOSIS — J441 Chronic obstructive pulmonary disease with (acute) exacerbation: Secondary | ICD-10-CM | POA: Diagnosis not present

## 2017-05-03 DIAGNOSIS — E876 Hypokalemia: Secondary | ICD-10-CM | POA: Diagnosis not present

## 2017-05-03 DIAGNOSIS — N184 Chronic kidney disease, stage 4 (severe): Secondary | ICD-10-CM | POA: Diagnosis not present

## 2017-05-03 DIAGNOSIS — M1990 Unspecified osteoarthritis, unspecified site: Secondary | ICD-10-CM | POA: Diagnosis present

## 2017-05-03 DIAGNOSIS — I739 Peripheral vascular disease, unspecified: Secondary | ICD-10-CM | POA: Diagnosis not present

## 2017-05-03 DIAGNOSIS — I2510 Atherosclerotic heart disease of native coronary artery without angina pectoris: Secondary | ICD-10-CM | POA: Diagnosis not present

## 2017-05-03 DIAGNOSIS — Z6837 Body mass index (BMI) 37.0-37.9, adult: Secondary | ICD-10-CM | POA: Diagnosis not present

## 2017-05-03 DIAGNOSIS — Z85828 Personal history of other malignant neoplasm of skin: Secondary | ICD-10-CM | POA: Diagnosis not present

## 2017-05-03 DIAGNOSIS — I252 Old myocardial infarction: Secondary | ICD-10-CM | POA: Diagnosis not present

## 2017-05-03 DIAGNOSIS — Z79899 Other long term (current) drug therapy: Secondary | ICD-10-CM | POA: Diagnosis not present

## 2017-05-03 DIAGNOSIS — Z955 Presence of coronary angioplasty implant and graft: Secondary | ICD-10-CM | POA: Diagnosis not present

## 2017-05-03 LAB — BASIC METABOLIC PANEL
Anion gap: 11 (ref 5–15)
BUN: 46 mg/dL — ABNORMAL HIGH (ref 6–20)
CO2: 20 mmol/L — ABNORMAL LOW (ref 22–32)
Calcium: 8.8 mg/dL — ABNORMAL LOW (ref 8.9–10.3)
Chloride: 105 mmol/L (ref 101–111)
Creatinine, Ser: 2.39 mg/dL — ABNORMAL HIGH (ref 0.61–1.24)
GFR calc Af Amer: 29 mL/min — ABNORMAL LOW (ref >60)
GFR calc non Af Amer: 25 mL/min — ABNORMAL LOW (ref >60)
Glucose, Bld: 113 mg/dL — ABNORMAL HIGH (ref 65–99)
Potassium: 3.7 mmol/L (ref 3.5–5.1)
Sodium: 136 mmol/L (ref 135–145)

## 2017-05-03 LAB — MAGNESIUM: Magnesium: 2.3 mg/dL (ref 1.7–2.4)

## 2017-05-03 LAB — MRSA PCR SCREENING: MRSA by PCR: NEGATIVE

## 2017-05-03 MED ORDER — ISOSORBIDE MONONITRATE ER 30 MG PO TB24
15.0000 mg | ORAL_TABLET | Freq: Every day | ORAL | Status: DC
  Administered 2017-05-03 – 2017-05-13 (×11): 15 mg via ORAL
  Filled 2017-05-03 (×11): qty 1

## 2017-05-03 MED ORDER — POTASSIUM CHLORIDE CRYS ER 20 MEQ PO TBCR
20.0000 meq | EXTENDED_RELEASE_TABLET | Freq: Every day | ORAL | Status: DC
  Administered 2017-05-03 – 2017-05-06 (×4): 20 meq via ORAL
  Filled 2017-05-03 (×5): qty 1

## 2017-05-03 MED ORDER — METOPROLOL SUCCINATE ER 25 MG PO TB24
12.5000 mg | ORAL_TABLET | Freq: Every day | ORAL | Status: DC
  Administered 2017-05-03 – 2017-05-13 (×11): 12.5 mg via ORAL
  Filled 2017-05-03 (×11): qty 1

## 2017-05-03 MED ORDER — HYDRALAZINE HCL 25 MG PO TABS
12.5000 mg | ORAL_TABLET | Freq: Three times a day (TID) | ORAL | Status: DC
  Administered 2017-05-03 – 2017-05-13 (×31): 12.5 mg via ORAL
  Filled 2017-05-03 (×31): qty 1

## 2017-05-03 NOTE — Progress Notes (Signed)
Pt has been voiding today. Last time voided had 15cc's left in bladder. Cardiology team notified.

## 2017-05-03 NOTE — Progress Notes (Addendum)
Progress Note  Patient Name: Terry Martinez. Date of Encounter: 05/03/2017  Primary Cardiologist: Dr. Harrington Challenger  Subjective   He states he's been weighing himself every day, but his weight "seemed to go from the 280s to 303 overnight." Finds it difficult to locate low sodium foods in the store. No dizziness. Feels "great" today. No pain. Remains edematous. Orthopnea and breathing much improved.  Inpatient Medications    Scheduled Meds: . allopurinol  100 mg Oral Daily  . amLODipine  5 mg Oral QHS  . aspirin EC  81 mg Oral Daily  . furosemide  60 mg Intravenous BID  . metoprolol tartrate  12.5 mg Oral BID  . pravastatin  40 mg Oral q1800  . rivaroxaban  15 mg Oral Q supper   Continuous Infusions:  PRN Meds: acetaminophen, albuterol, nitroGLYCERIN, ondansetron (ZOFRAN) IV, polyethylene glycol   Vital Signs    Vitals:   05/02/17 2119 05/03/17 0040 05/03/17 0604 05/03/17 0756  BP: (!) 154/68 (!) 126/59 (!) 131/58 (!) 121/43  Pulse: 71 95 (!) 52 (!) 53  Resp: 20 18 18 18   Temp: 97.6 F (36.4 C) (!) 97.5 F (36.4 C) (!) 97.5 F (36.4 C) (!) 97.3 F (36.3 C)  TempSrc: Oral Oral Oral Oral  SpO2: 93% 95% 94% 93%  Weight: 298 lb 9.6 oz (135.4 kg)  298 lb 9.6 oz (135.4 kg)   Height: 6\' 3"  (1.905 m)       Intake/Output Summary (Last 24 hours) at 05/03/2017 0847 Last data filed at 05/03/2017 0500 Gross per 24 hour  Intake 240 ml  Output 662 ml  Net -422 ml   Filed Weights   05/02/17 1442 05/02/17 2119 05/03/17 0604  Weight: (!) 303 lb (137.4 kg) 298 lb 9.6 oz (135.4 kg) 298 lb 9.6 oz (135.4 kg)    Telemetry    Atrial fib SVR at times, occ PVCs - Personally Reviewed  Physical Exam   GEN: No acute distress, morbidly obese HEENT: Normocephalic, atraumatic, sclera non-icteric. Neck: Mod elevated JVD Cardiac: Borderline bradycardic, irregular, no murmurs, rubs, or gallops.  Radials/DP/PT 1+ and equal bilaterally.  Respiratory: Clear to auscultation bilaterally.  Breathing is unlabored. GI: Soft, nontender, non-distended, BS +x 4. MS: no deformity. Extremities: No clubbing or cyanosis. 2-3+ shiny BLE edema with chronic stasis changes. Neuro:  AAOx3. Follows commands. Psych:  Responds to questions appropriately with a normal affect.  Labs    Chemistry Recent Labs  Lab 05/02/17 1500 05/03/17 0701  NA 135 136  K 3.3* 3.7  CL 106 105  CO2 19* 20*  GLUCOSE 133* 113*  BUN 44* 46*  CREATININE 2.51* 2.39*  CALCIUM 8.8* 8.8*  GFRNONAA 23* 25*  GFRAA 27* 29*  ANIONGAP 10 11     Hematology Recent Labs  Lab 05/02/17 1500  WBC 6.3  RBC 3.53*  HGB 11.0*  HCT 34.4*  MCV 97.5  MCH 31.2  MCHC 32.0  RDW 18.3*  PLT 136*    Cardiac EnzymesNo results for input(s): TROPONINI in the last 168 hours.  Recent Labs  Lab 05/02/17 1527 05/02/17 1858  TROPIPOC 0.01 0.00     BNP Recent Labs  Lab 05/02/17 1500  BNP 723.6*     DDimer No results for input(s): DDIMER in the last 168 hours.   Radiology    Dg Chest 2 View  Result Date: 05/02/2017 CLINICAL DATA:  Shortness of breath and leg swelling for the past 2 weeks. EXAM: CHEST - 2 VIEW COMPARISON:  Chest  x-ray dated January 27, 2017. FINDINGS: Stable cardiomegaly. Mild interstitial prominence. New small bilateral pleural effusions. No consolidation or pneumothorax. No acute osseous abnormality. IMPRESSION: New small bilateral pleural effusions and mild interstitial edema. Electronically Signed   By: Titus Dubin M.D.   On: 05/02/2017 16:25   Dg Shoulder Left  Result Date: 05/02/2017 CLINICAL DATA:  Generalized left shoulder pain x2 weeks EXAM: LEFT SHOULDER - 2+ VIEW COMPARISON:  01/27/2017 CXR FINDINGS: Mild degenerative joint space narrowing and spurring about the [facility] and glenohumeral joints. No joint dislocation, fracture or suspicious osseous lesions. IMPRESSION: Mild osteoarthritic joint space narrowing and spurring of the left AC and glenohumeral joints. Electronically Signed   By:  Ashley Royalty M.D.   On: 05/02/2017 20:49    Patient Profile     77 y.o. male with a PMH of chronic combine CHF (Last Echo 01/2017 with EF 35-45%), CAD s/p 6 PCI (last PCI 2014), PAD s/p aorta-bifemoral bypass with multiple amputations, permanent atrial fibrillation, moderate AI by echo 01/2017, pulm HTN (VQ neg for PE per Dr. Ross/radiology review 01/2017), obesity, CKD stage III, chronic appearing anemia/thrombocytopenia who presented with DOE for several weeks. Admitted to cardiology for acute on chronic combined CHF, AKI on CKD, hypokalemia, pleural effusions in the setting of eating chicken and rice soup frequently. +20lb weight gain since last DC in 01/2017 (wt 285lb, admitted at 303lb). Has had recent increasing symptoms despite increasing home torsemide dosing.  Assessment & Plan    1. Acute on chronic combined CHF: discussed echo plan with Dr. Sallyanne Kuster; given recently done in 01/2017, will not repeat. Diuresing thus far with IV Lasix (suspect decreased gut absorption of oral form as OP). Would continue for now and watch kidney function. Cut back BB as below. Will d/c amlodipine and add low dose Imdur/hydralazine to optimize CHF regimen. No on ACEI/ARB/spiro due to renal insufficiency.  2.  Acute on chronic renal insufficiency (stage III): suspect cardiorenal, recent baseline around 1.7. Plateaued with diuresis. Follow.  3. CAD s/p multiple PCI: 6 total, last PCI 2014. Without CP complaints. Troponin negative. Continue aspirin and monitor for bleeding.  4. Permanent atrial fibrillation with slow ventricular response and PVCs: d/c Lopressor and start lower dose Toprol 12.5mg  daily with hold parameters given bradycardia on telemetry. Pharmacy to follow along for Xarelto dosing.   5. L shoulder pain: atraumatic - improved. Joint space narrowing and spurring noted. If symptoms recur, consult ortho.  For questions or updates, please contact [place] Please consult [email or web address] for  contact info under Cardiology/STEMI.  Signed, Charlie Pitter, PA-C 05/03/2017, 8:47 AM    I have seen and examined the patient along with Charlie Pitter, PA-C.  I have reviewed the chart, notes and new data.  I agree with PA's note.  Key new complaints: improved breathing, orthopnea and supine cough almost resolved Key examination changes: severe dependent edema, clear lungs, marked bradycardia Key new findings / data: slight improvement in renal parameters despite diuresis.  PLAN: Continue diuretics. Replace amlodipine with hydralazine/nitrates. Reduce beta blocker dose. Current degree of bradycardia limits cardiac output.  Sanda Klein, MD, [place] [phone] 05/03/2017, 9:29 AM

## 2017-05-03 NOTE — Progress Notes (Signed)
Admitted pt from the ED per stretcher assisted by the NT. Alert, oriented x 4, denies chest pain, denies nausea and vomiting, not in respiratory distress. Placed on telemetry box 39 verified by the NT. Oriented to room and call bell. Educated about the patient fall prevention safety plan with understanding. Assisted pt to the bathroom and voided 300cc, bladder was scan right after voiding showing only 12cc. Will hold insertion of foley catheter for now , MD upadated (Dr. Felipa Emory) and verbalized "Okay not to insert foley catheter now". Will continue to monitor pt.   05/02/17 2119  Vitals  Temp 97.6 F (36.4 C)  Temp Source Oral  BP (!) 154/68  BP Location Right Arm  BP Method Automatic  Patient Position (if appropriate) Sitting  Pulse Rate 71  Pulse Rate Source Dinamap  Resp 20  Oxygen Therapy  SpO2 93 %  O2 Device Room Air  Pain Assessment  Pain Assessment No/denies pain  Height and Weight  Height 6\' 3"  (1.905 m)  Weight 135.4 kg (298 lb 9.6 oz)  Type of Scale Used Standing  Type of Weight Actual  BSA (Calculated - sq m) 2.68 sq meters  BMI (Calculated) 37.32  Weight in (lb) to have BMI = 25 199.6

## 2017-05-03 NOTE — Progress Notes (Signed)
[facility] Neph Note  (n/c)  I sent pt from our office yesterday with acute on chronic diastolic heart failure.  I stopped his enalapril in February 2019 due to issues with fluctuating renal function with diuresis. In my opinion should not be restarted - purpose of this note is merely to inform that he has NOT been on enalapril since late February.  Jamal Maes, MD [facility] [phone] Pager 05/03/2017, 9:19 AM

## 2017-05-03 NOTE — Discharge Instructions (Signed)

## 2017-05-03 NOTE — Progress Notes (Signed)
K 3.7 after repletion yesterday. Will start low dose 54meq KCl daily but need to follow BMET daily given CKD. Mg level also sent given PVCs. Laiza Veenstra PA-C

## 2017-05-03 NOTE — Progress Notes (Signed)
ANTICOAGULATION CONSULT NOTE - Initial Consult  Pharmacy Consult for Xarelto Indication: atrial fibrillation  Allergies  Allergen Reactions  . Zocor [Simvastatin] Hives and Rash    Patient Measurements: Height: 6\' 3"  (190.5 cm) Weight: 298 lb 9.6 oz (135.4 kg)(scale c) IBW/kg (Calculated) : 84.5  Vital Signs: Temp: 97.3 F (36.3 C) (03/14 0756) Temp Source: Oral (03/14 0756) BP: 138/58 (03/14 0938) Pulse Rate: 89 (03/14 0938)  Labs: Recent Labs    05/02/17 1500 05/03/17 0701  HGB 11.0*  --   HCT 34.4*  --   PLT 136*  --   CREATININE 2.51* 2.39*    Estimated Creatinine Clearance: 39 mL/min (A) (by C-G formula based on SCr of 2.39 mg/dL (H)).   Medical History: Past Medical History:  Diagnosis Date  . Acute on chronic combined systolic and diastolic CHF (congestive heart failure) ([place]) 01/26/2017  . Anemia    hx low iron  . Aortic insufficiency 01/26/2017  . Arthritis    "hands" (2/262018)  . Basal cell carcinoma    "left side of my face"  . Charcot's arthropathy   . Chronic combined systolic and diastolic CHF (congestive heart failure) ([place])   . Chronic pain   . CKD (chronic kidney disease), stage III ([place]) 01/26/2017  . Constipation   . COPD (chronic obstructive pulmonary disease) ([place])   . Coronary artery disease   . DVT, lower extremity ([place])    many years  . Dyspnea    with exertion  . Family history of adverse reaction to anesthesia    sister has difficulty waking up  . Gout   . Heart murmur   . History of blood transfusion 1960s   "related to being cut up w/barbed wire"  . History of kidney stones   . Hyperlipidemia   . Hypertension   . Incisional hernia    x 2  . Myocardial infarction ([place])    3 stents  . Osteomyelitis of toe of left foot ([place])   . Peripheral artery disease ([place])   . Permanent atrial fibrillation ([place])   . Pneumonia   . 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 Subclavian artery stenosis, left  (HCC)    [name] 04/17/2016   Assessment: CC/HPI: DOE, LE edema, weight gain  PMH: chronic combine CHF (Last Echo 01/2017 with EF 35-45%), CAD s/p 6 PCI (last PCI 2014), PAD s/p aorta-bifemoral bypass, permanent atrial fibrillation, AI, CKD stage III,chronic pain, remote h/o DVT, HTN, HLD, MI,  Significant events: Recent hospital discharge 12/18  Anticoag: Xarelto 20mg /d PTA for afib. CrCl currently 39 Renal: CKD3, Scr 1.74 (1/19) now 2.51>2.39. Lytes ok.  Goal of Therapy:   Therapeutic oral anticoagulation   Plan:  Xarelto reduced dose 15mg  daily ok for CrCl<50  Terry Martinez, PharmD, BCPS Clinical Staff Pharmacist Pager [phone]  Terry Martinez 05/03/2017,9:48 AM

## 2017-05-04 ENCOUNTER — Inpatient Hospital Stay (HOSPITAL_COMMUNITY): Payer: Medicare Other

## 2017-05-04 LAB — CBC
HEMATOCRIT: 32.3 % — AB (ref 39.0–52.0)
HEMOGLOBIN: 10.4 g/dL — AB (ref 13.0–17.0)
MCH: 31.6 pg (ref 26.0–34.0)
MCHC: 32.2 g/dL (ref 30.0–36.0)
MCV: 98.2 fL (ref 78.0–100.0)
Platelets: 125 10*3/uL — ABNORMAL LOW (ref 150–400)
RBC: 3.29 MIL/uL — ABNORMAL LOW (ref 4.22–5.81)
RDW: 18.8 % — ABNORMAL HIGH (ref 11.5–15.5)
WBC: 5.8 10*3/uL (ref 4.0–10.5)

## 2017-05-04 LAB — BASIC METABOLIC PANEL
ANION GAP: 11 (ref 5–15)
BUN: 49 mg/dL — ABNORMAL HIGH (ref 6–20)
CO2: 19 mmol/L — ABNORMAL LOW (ref 22–32)
Calcium: 8.8 mg/dL — ABNORMAL LOW (ref 8.9–10.3)
Chloride: 106 mmol/L (ref 101–111)
Creatinine, Ser: 2.36 mg/dL — ABNORMAL HIGH (ref 0.61–1.24)
GFR calc Af Amer: 29 mL/min — ABNORMAL LOW (ref >60)
GFR calc non Af Amer: 25 mL/min — ABNORMAL LOW (ref >60)
GLUCOSE: 139 mg/dL — AB (ref 65–99)
POTASSIUM: 3.6 mmol/L (ref 3.5–5.1)
Sodium: 136 mmol/L (ref 135–145)

## 2017-05-04 LAB — MAGNESIUM: Magnesium: 2.2 mg/dL (ref 1.7–2.4)

## 2017-05-04 MED ORDER — RIVAROXABAN 20 MG PO TABS
20.0000 mg | ORAL_TABLET | Freq: Every day | ORAL | Status: DC
  Administered 2017-05-04 – 2017-05-10 (×7): 20 mg via ORAL
  Filled 2017-05-04 (×7): qty 1

## 2017-05-04 MED ORDER — FUROSEMIDE 10 MG/ML IJ SOLN
80.0000 mg | Freq: Two times a day (BID) | INTRAMUSCULAR | Status: DC
  Administered 2017-05-04 – 2017-05-11 (×14): 80 mg via INTRAVENOUS
  Filled 2017-05-04 (×14): qty 8

## 2017-05-04 NOTE — Progress Notes (Signed)
Patient complaining of hand cramps and asking for mustard, mustard giving and patients says it was effective.

## 2017-05-04 NOTE — Consult Note (Signed)
   [facility] CM Inpatient Consult   05/04/2017  Keimon Basaldua. Feb 03, 1941 [id number]  Patient evaluated for community based chronic disease management services with [facility] as a benefit of patient's Medicare Insurance. Spoke with patient, sister, [name] and brother-in-law, at bedside to explain [facility] Management services. Patient agrees to family being at bedside for discussion of post hospital needs.   Sister and her husband provides transportation. He uses Paediatric nurse for pharmacy.  His primary Care provider is Dr. Lovenia Kim at [facility].  This office is listed to provide the post hospital transition of care follow up.  Patient states his main issues are resources his teeth.  States,"I am having a difficult time eating with these teeth. I wanted to see if there is a dentist or somebody that can help with me getting these fixed and assistance paying for it." Explained [facility] Care management services with social worker. They live in [place]. Consent form signed by sister per patient's request as the patient was starting to work with physical therapy.  Sister states that the patient does have to memory deficits.  Patient will receive post hospital discharge call and will be evaluated for monthly home visits for assessments and disease process education.  Left contact information, folder,  and [facility] literature at bedside. Made Inpatient Case Manager aware that [facility] Management following. Of note, [facility] Care Management services does not replace or interfere with any services that are arranged by inpatient case management or social work.  For additional questions or referrals please contact:    Natividad Brood, RN BSN [facility] Liaison  [phone] business mobile phone Toll free office [phone]

## 2017-05-04 NOTE — Progress Notes (Signed)
ANTICOAGULATION CONSULT NOTE -f/u Consult  Pharmacy Consult for Xarelto Indication: atrial fibrillation  Allergies  Allergen Reactions  . Zocor [Simvastatin] Hives and Rash    Patient Measurements: Height: 6\' 3"  (190.5 cm) Weight: (!) 301 lb 9.6 oz (136.8 kg) IBW/kg (Calculated) : 84.5  Vital Signs: Temp: 97.7 F (36.5 C) (03/15 1114) Temp Source: Oral (03/15 1114) BP: 149/58 (03/15 1114) Pulse Rate: 71 (03/15 1114)  Labs: Recent Labs    05/02/17 1500 05/03/17 0701 05/04/17 0738  HGB 11.0*  --  10.4*  HCT 34.4*  --  32.3*  PLT 136*  --  125*  CREATININE 2.51* 2.39* 2.36*    Estimated Creatinine Clearance: 39.7 mL/min (A) (by C-G formula based on SCr of 2.36 mg/dL (H)).   Medical History: Past Medical History:  Diagnosis Date  . Acute on chronic combined systolic and diastolic CHF (congestive heart failure) ([place]) 01/26/2017  . Anemia    hx low iron  . Aortic insufficiency 01/26/2017  . Arthritis    "hands" (2/262018)  . Basal cell carcinoma    "left side of my face"  . Charcot's arthropathy   . Chronic combined systolic and diastolic CHF (congestive heart failure) ([place])   . Chronic pain   . CKD (chronic kidney disease), stage III ([place]) 01/26/2017  . Constipation   . COPD (chronic obstructive pulmonary disease) ([place])   . Coronary artery disease   . DVT, lower extremity ([place])    many years  . Dyspnea    with exertion  . Family history of adverse reaction to anesthesia    sister has difficulty waking up  . Gout   . Heart murmur   . History of blood transfusion 1960s   "related to being cut up w/barbed wire"  . History of kidney stones   . Hyperlipidemia   . Hypertension   . Incisional hernia    x 2  . Myocardial infarction ([place])    3 stents  . Osteomyelitis of toe of left foot ([place])   . Peripheral artery disease ([place])   . Permanent atrial fibrillation ([place])   . Pneumonia   . 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  Subclavian artery stenosis, left (HCC)    [name] 04/17/2016   Assessment:   Anticoag: Xarelto 20mg /d PTA for afib. CrCl 51 using TBW with DOACs. Scr down to 2.36. Hgb 10.4. Plts 125.   Goal of Therapy:   Therapeutic oral anticoagulation   Plan:  Increase Xarelto to 20mg  q supper for CrCl>50  Merridy Pascoe S. Alford Highland, PharmD, BCPS Clinical Staff Pharmacist Pager [phone]  Eilene Ghazi Stillinger 05/04/2017,3:28 PM

## 2017-05-04 NOTE — Progress Notes (Addendum)
Progress Note  Patient Name: Terry Martinez. Date of Encounter: 05/04/2017  Primary Cardiologist: Dorris Carnes, MD   Subjective   Still some SOB, still not at baseline. Denies any CP.   Inpatient Medications    Scheduled Meds: . allopurinol  100 mg Oral Daily  . aspirin EC  81 mg Oral Daily  . furosemide  60 mg Intravenous BID  . hydrALAZINE  12.5 mg Oral Q8H  . isosorbide mononitrate  15 mg Oral Daily  . metoprolol succinate  12.5 mg Oral Daily  . potassium chloride  20 mEq Oral Daily  . pravastatin  40 mg Oral q1800  . rivaroxaban  15 mg Oral Q supper   Continuous Infusions:  PRN Meds: acetaminophen, albuterol, nitroGLYCERIN, ondansetron (ZOFRAN) IV, polyethylene glycol   Vital Signs    Vitals:   05/04/17 0619 05/04/17 0800 05/04/17 0903 05/04/17 1114  BP:   (!) 149/59 (!) 149/58  Pulse:  80  71  Resp:   19 18  Temp:  (!) 97.5 F (36.4 C)  97.7 F (36.5 C)  TempSrc:  Oral  Oral  SpO2: 94% 95%  94%  Weight:      Height:        Intake/Output Summary (Last 24 hours) at 05/04/2017 1339 Last data filed at 05/04/2017 0945 Gross per 24 hour  Intake 600 ml  Output 1001 ml  Net -401 ml   Filed Weights   05/02/17 2119 05/03/17 0604 05/04/17 0600  Weight: 298 lb 9.6 oz (135.4 kg) 298 lb 9.6 oz (135.4 kg) (!) 301 lb 9.6 oz (136.8 kg)    Telemetry    Atrial fibrillation with PVCs - Personally Reviewed  ECG    Atrial fibrillation with PVCs - Personally Reviewed  Physical Exam   GEN: No acute distress.   Neck: No JVD Cardiac: irregularly irregular, no murmurs, rubs, or gallops.  Respiratory: Clear to auscultation bilaterally. GI: Soft, nontender, non-distended  MS: No deformity. 3+ pitting edema Neuro:  Nonfocal  Psych: Normal affect   Labs    Chemistry Recent Labs  Lab 05/02/17 1500 05/03/17 0701 05/04/17 0738  NA 135 136 136  K 3.3* 3.7 3.6  CL 106 105 106  CO2 19* 20* 19*  GLUCOSE 133* 113* 139*  BUN 44* 46* 49*  CREATININE 2.51* 2.39*  2.36*  CALCIUM 8.8* 8.8* 8.8*  GFRNONAA 23* 25* 25*  GFRAA 27* 29* 29*  ANIONGAP 10 11 11      Hematology Recent Labs  Lab 05/02/17 1500 05/04/17 0738  WBC 6.3 5.8  RBC 3.53* 3.29*  HGB 11.0* 10.4*  HCT 34.4* 32.3*  MCV 97.5 98.2  MCH 31.2 31.6  MCHC 32.0 32.2  RDW 18.3* 18.8*  PLT 136* 125*    Cardiac EnzymesNo results for input(s): TROPONINI in the last 168 hours.  Recent Labs  Lab 05/02/17 1527 05/02/17 1858  TROPIPOC 0.01 0.00     BNP Recent Labs  Lab 05/02/17 1500  BNP 723.6*     DDimer No results for input(s): DDIMER in the last 168 hours.   Radiology    Dg Chest 2 View  Result Date: 05/02/2017 CLINICAL DATA:  Shortness of breath and leg swelling for the past 2 weeks. EXAM: CHEST - 2 VIEW COMPARISON:  Chest x-ray dated January 27, 2017. FINDINGS: Stable cardiomegaly. Mild interstitial prominence. New small bilateral pleural effusions. No consolidation or pneumothorax. No acute osseous abnormality. IMPRESSION: New small bilateral pleural effusions and mild interstitial edema. Electronically Signed   By:  Titus Dubin M.D.   On: 05/02/2017 16:25   Dg Shoulder Left  Result Date: 05/02/2017 CLINICAL DATA:  Generalized left shoulder pain x2 weeks EXAM: LEFT SHOULDER - 2+ VIEW COMPARISON:  01/27/2017 CXR FINDINGS: Mild degenerative joint space narrowing and spurring about the [facility] and glenohumeral joints. No joint dislocation, fracture or suspicious osseous lesions. IMPRESSION: Mild osteoarthritic joint space narrowing and spurring of the left AC and glenohumeral joints. Electronically Signed   By: Ashley Royalty M.D.   On: 05/02/2017 20:49    Cardiac Studies   Echo 01/26/2017 LV EF: 35% -   40% Study Conclusions  - Left ventricle: The cavity size was moderately dilated. Systolic   function was moderately reduced. The estimated ejection fraction   was in the range of 35% to 40%. Wall motion was normal; there   were no regional wall motion abnormalities. -  Ventricular septum: The contour showed diastolic flattening and   systolic flattening. - Aortic valve: Valve mobility was restricted. There was very mild   stenosis. There was moderate regurgitation. - Aortic root: The aortic root was dilated measuring 49 mm. - Mitral valve: Calcified annulus. Mildly thickened leaflets .   There was mild regurgitation. - Left atrium: The atrium was severely dilated. - Right ventricle: The cavity size was severely dilated. Wall   thickness was normal. Systolic function was severely reduced. - Right atrium: The atrium was severely dilated. - Tricuspid valve: There was moderate-severe regurgitation. - Pulmonic valve: There was moderate regurgitation. - Pulmonary arteries: Systolic pressure was moderately increased.   PA peak pressure: 45 mm Hg (S). - Inferior vena cava: The vessel was dilated. The respirophasic   diameter changes were blunted (< 50%), consistent with elevated   central venous pressure.  Impressions:  - When compared to the prior study from 05/26/2016 LVEF has   decreased, now 35-40%. LV is moderately dilated.   RV is severely dilated with severe systolic dysfunction.   There is biatrial atrial dilatation.   Aortic regurgitation is moderate, aortic root has further dilated   at the sinus level measuring 49 mm, previously 47 mm.   Moderate pulmonary hypertension.  Patient Profile     77 y.o. 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 Ross/radiology review 01/2017), obesity, CKD stage III, chronic appearing anemia/thrombocytopenia who presented with DOE for several weeks. Admitted to cardiology for acute on chronic combined CHF, AKI on CKD, hypokalemia, pleural effusions in the setting of eating chicken and rice soup frequently.  +20lb weight gain since last DC in 01/2017 (wt 285lb, admitted at 303lb). Has had recent increasing symptoms despite increasing home torsemide dosing.   Assessment & Plan    1. Acute on chronic combined systolic and diastolic HF:   - continue to have 3+ pitting edema, weight went up to 301. Urine output low  - compression dressing to lower leg. Increase IV lasix to 80mg  BID, monitor renal function closely, if still does not diurese with higher dose of lasix, may need heart failure consult.   2. Acute on chronic renal insufficiency: baseline Cr 1.5-1.7. Cr today 2.36   3. CAD s/p multiple PCI: last PCI 2014. No chest pain  4. Permanent atrial fibrillation with slow ventricular response: Metoprolol tartrate discontinued, switched to low dose Toprol XL.  Pharmacy managing Xarelto  5. L shoulder pain: denies any recent trauma, worse with palpation and arm rotation, clearly musculoskeletal. Recent shoulder x ray 05/02/2017 Mild osteoarthritic joint space narrowing and spurring of the left AC and glenohumeral joints.   For questions or updates, please contact [place] Please consult [email or web address] for contact info under Cardiology/STEMI.      Hilbert Corrigan, PA  05/04/2017, 1:39 PM     I have seen and examined the patient along with Almyra Deforest, PA .  I have reviewed the chart, notes and new data.  I agree with PA/NP's note.  Key new complaints: able to lie flat without dyspnea Key examination changes: still with substantial leg edema, placing elastic wraps right now, +ve JVD, no longer bradycardic (ventricular rate 80 now) Key new findings / data: creatinine remains approx 2.3  PLAN: Moving rather slowly with diuresis, but compelled to do so by renal dysfunction. Suspect a significant part of the problem is severe RV dysfunction which limits the cardiac output. Might have to consider milrinone/CHF consult. Low threshold to consult Nephrology if diuresis slows further or creatinine  deteriorates even a little bit. Avoiding RAAS inhibitors and nephrotoxic agents,  Sanda Klein, MD, [facility] [phone] 05/04/2017, 3:36 PM

## 2017-05-04 NOTE — Evaluation (Signed)
Physical Therapy Evaluation Patient Details Name: Terry Martinez. MRN: [id number] DOB: 29-Feb-1940 Today's Date: 05/04/2017   History of Present Illness  77 y/o male admitted secondary to CHF exacerbation, acute renal insufficiency, permanent a-fib.   Clinical Impression  Pt was seen for evaluation of his mobility and note his O2 sats are stable and over 90% with gait, and are indicating pt will need assist at home as his pulse elevates signfiicantly during therapy.  Pt is not worried but does have supportive family during the evaluation.  Will plan to follow acutely for strengthening and to increase standing balance with endurance for safer use of rolling walker    Follow Up Recommendations Home health PT;Supervision/Assistance - 24 hour    Equipment Recommendations  Other (comment)(rollator walker)    Recommendations for Other Services       Precautions / Restrictions Precautions Precautions: Fall(teleemetry) Restrictions Weight Bearing Restrictions: No      Mobility  Bed Mobility Overal bed mobility: Needs Assistance Bed Mobility: Supine to Sit;Sit to Supine     Supine to sit: Min assist Sit to supine: Min assist;Mod assist   General bed mobility comments: uses rail to assist getting up from bed  Transfers Overall transfer level: Needs assistance Equipment used: Rolling walker (2 wheeled);1 person hand held assist Transfers: Sit to/from Stand Sit to Stand: Min assist         General transfer comment: uses rollator with no brakes on to stand up and steady  Ambulation/Gait Ambulation/Gait assistance: Min assist Ambulation Distance (Feet): 150 Feet Assistive device: 4-wheeled walker;1 person hand held assist Gait Pattern/deviations: Decreased stride length;Step-through pattern;Wide base of support;Drifts right/left Gait velocity: reduced Gait velocity interpretation: Below normal speed for age/gender    Stairs            Wheelchair Mobility     Modified Rankin (Stroke Patients Only)       Balance Overall balance assessment: Needs assistance                                           Pertinent Vitals/Pain Pain Assessment: No/denies pain    Home Living Family/patient expects to be discharged to:: Private residence Living Arrangements: Other relatives Available Help at Discharge: Family;Available 24 hours/day Type of Home: House Home Access: Stairs to enter Entrance Stairs-Rails: Left;Right;Can reach both Entrance Stairs-Number of Steps: 1 Home Layout: One level Home Equipment: Walker - 4 wheels;Shower seat Additional Comments: needs a new bariatric rollator walker    Prior Function Level of Independence: Independent with assistive device(s)         Comments: Uses rollator for ambulation      Hand Dominance        Extremity/Trunk Assessment   Upper Extremity Assessment Upper Extremity Assessment: Overall WFL for tasks assessed    Lower Extremity Assessment Lower Extremity Assessment: Generalized weakness    Cervical / Trunk Assessment Cervical / Trunk Assessment: Normal  Communication   Communication: No difficulties  Cognition Arousal/Alertness: Awake/alert Behavior During Therapy: WFL for tasks assessed/performed Overall Cognitive Status: Within Functional Limits for tasks assessed                                        General Comments General comments (skin integrity, edema, etc.): O2 sats were ck'd pre and post  gait, with resulting gait after walk beginning    Exercises     Assessment/Plan    PT Assessment Patient needs continued PT services  PT Problem List Decreased strength;Decreased range of motion;Decreased activity tolerance;Decreased mobility;Decreased coordination       PT Treatment Interventions DME instruction;Gait training;Functional mobility training;Therapeutic activities;Therapeutic exercise;Balance training;Neuromuscular  re-education;Cognitive remediation;Patient/family education    PT Goals (Current goals can be found in the Care Plan section)  Acute Rehab PT Goals Patient Stated Goal: to get home PT Goal Formulation: With patient/family Time For Goal Achievement: 05/18/17 Potential to Achieve Goals: Good    Frequency Min 3X/week   Barriers to discharge Decreased caregiver support      Co-evaluation               AM-PAC PT "6 Clicks" Daily Activity  Outcome Measure Difficulty turning over in bed (including adjusting bedclothes, sheets and blankets)?: A Little Difficulty moving from lying on back to sitting on the side of the bed? : Unable Difficulty sitting down on and standing up from a chair with arms (e.g., wheelchair, bedside commode, etc,.)?: Unable Help needed moving to and from a bed to chair (including a wheelchair)?: A Little Help needed walking in hospital room?: A Little Help needed climbing 3-5 steps with a railing? : A Little 6 Click Score: 14    End of Session Equipment Utilized During Treatment: Gait belt Activity Tolerance: Patient tolerated treatment well;Patient limited by fatigue Patient left: in bed;with call bell/phone within reach;with bed alarm set;with chair alarm set Nurse Communication: Mobility status;Patient requests pain meds PT Visit Diagnosis: Unsteadiness on feet (R26.81);Other abnormalities of gait and mobility (R26.89);Muscle weakness (generalized) (M62.81);Difficulty in walking, not elsewhere classified (R26.2);Apraxia (R48.2);History of falling (Z91.81);Other symptoms and signs involving the nervous system (R29.898);BPPV;Dizziness and giddiness (R42);Adult, failure to thrive (R62.7) BPPV - Right/Left : Right    Time: 4627-0350 PT Time Calculation (min) (ACUTE ONLY): 19 min   Charges:   PT Evaluation $PT Eval Moderate Complexity: 1 Mod     PT G Codes:   PT G-Codes **NOT FOR INPATIENT CLASS** Functional Assessment Tool Used: AM-PAC 6 Clicks Basic  Mobility   Ramond Dial 05/04/2017, 10:52 PM   Mee Hives, PT MS Acute Rehab Dept. Number: [place] and [place]

## 2017-05-04 NOTE — Progress Notes (Signed)
Wrapped bilateral lower extremities with compression wrap per Almyra Deforest, PA request. Pt tolerated well.

## 2017-05-05 LAB — BASIC METABOLIC PANEL
ANION GAP: 11 (ref 5–15)
BUN: 50 mg/dL — ABNORMAL HIGH (ref 6–20)
CHLORIDE: 106 mmol/L (ref 101–111)
CO2: 20 mmol/L — ABNORMAL LOW (ref 22–32)
Calcium: 8.8 mg/dL — ABNORMAL LOW (ref 8.9–10.3)
Creatinine, Ser: 2.24 mg/dL — ABNORMAL HIGH (ref 0.61–1.24)
GFR calc Af Amer: 31 mL/min — ABNORMAL LOW (ref >60)
GFR, EST NON AFRICAN AMERICAN: 27 mL/min — AB (ref >60)
Glucose, Bld: 112 mg/dL — ABNORMAL HIGH (ref 65–99)
POTASSIUM: 3.5 mmol/L (ref 3.5–5.1)
SODIUM: 137 mmol/L (ref 135–145)

## 2017-05-05 LAB — MAGNESIUM: MAGNESIUM: 2.1 mg/dL (ref 1.7–2.4)

## 2017-05-05 MED ORDER — METOLAZONE 2.5 MG PO TABS
2.5000 mg | ORAL_TABLET | Freq: Every day | ORAL | Status: AC
  Administered 2017-05-05 – 2017-05-06 (×2): 2.5 mg via ORAL
  Filled 2017-05-05 (×2): qty 1

## 2017-05-05 NOTE — Plan of Care (Signed)
  Education: Knowledge of General Education information will improve 05/05/2017 0131 - Progressing by Tristan Schroeder, RN   Health Behavior/Discharge Planning: Ability to manage health-related needs will improve 05/05/2017 0131 - Progressing by Tristan Schroeder, RN

## 2017-05-05 NOTE — Progress Notes (Signed)
Removed RAC IV due to redness, swelling and tenderness.

## 2017-05-05 NOTE — Progress Notes (Signed)
Removed ace wraps from legs and told patient we will let his legs rest then re-wrap later.

## 2017-05-05 NOTE — Progress Notes (Signed)
Subjective:  Still edematous and legs are wrapped.  Still some shortness of breath  Objective:  Vital Signs in the last 24 hours: BP (!) 139/55 (BP Location: Right Arm)   Pulse 61   Temp 97.9 F (36.6 C) (Oral)   Resp 20   Ht 6\' 3"  (1.905 m)   Wt (!) 136.5 kg (301 lb)   SpO2 94%   BMI 37.62 kg/m   Physical Exam: Obese elderly male sitting up in bed in no acute distress Lungs: crackles at bases Cardiac:  irregular rhythm, normal S1 and S2, no S3 Abdomen:  Soft, nontender, no masses Extremities:  Legs wrapped, 2+ edema above the wrappings  Intake/Output from previous day: 03/15 0701 - 03/16 0700 In: 1276 [P.O.:1276] Out: 1400 [Urine:1400] Weight Filed Weights   05/03/17 0604 05/04/17 0600 05/05/17 0730  Weight: 135.4 kg (298 lb 9.6 oz) (!) 136.8 kg (301 lb 9.6 oz) (!) 136.5 kg (301 lb)    Lab Results: Basic Metabolic Panel: Recent Labs    05/04/17 0738 05/05/17 0432  NA 136 137  K 3.6 3.5  CL 106 106  CO2 19* 20*  GLUCOSE 139* 112*  BUN 49* 50*  CREATININE 2.36* 2.24*    CBC: Recent Labs    05/02/17 1500 05/04/17 0738  WBC 6.3 5.8  HGB 11.0* 10.4*  HCT 34.4* 32.3*  MCV 97.5 98.2  PLT 136* 125*   BNP    Component Value Date/Time   BNP 723.6 (H) 05/02/2017 1500   PROTIME: Lab Results  Component Value Date   INR 1.83 03/02/2017   INR 2.2 09/27/2016   INR 2.8 09/07/2016    Telemetry: Atrial fibrillation with PVCs personally reviewed  Assessment/Plan:  1.  Acute on chronic combined systolic and diastolic heart failure-weight has really not changed 2.  Acute on chronic renal failure creatinine somewhat lower today 3.  CAD with previous multiple CPI 4.  Permanent atrial fibrillation  Recommendations:  I will add a dose of Zaroxolyn today and will monitor renal function.  If fails to diurese over weekend CHF consult.     Kerry Hough  MD [facility]  05/05/2017, 12:32 PM

## 2017-05-06 LAB — BASIC METABOLIC PANEL
ANION GAP: 10 (ref 5–15)
BUN: 49 mg/dL — ABNORMAL HIGH (ref 6–20)
CALCIUM: 8.9 mg/dL (ref 8.9–10.3)
CO2: 23 mmol/L (ref 22–32)
CREATININE: 2.13 mg/dL — AB (ref 0.61–1.24)
Chloride: 105 mmol/L (ref 101–111)
GFR calc non Af Amer: 28 mL/min — ABNORMAL LOW (ref >60)
GFR, EST AFRICAN AMERICAN: 33 mL/min — AB (ref >60)
Glucose, Bld: 116 mg/dL — ABNORMAL HIGH (ref 65–99)
Potassium: 3.2 mmol/L — ABNORMAL LOW (ref 3.5–5.1)
SODIUM: 138 mmol/L (ref 135–145)

## 2017-05-06 LAB — MAGNESIUM: MAGNESIUM: 2.2 mg/dL (ref 1.7–2.4)

## 2017-05-06 NOTE — Progress Notes (Signed)
Received a call from central monitor tech informing this RN that pt had 2.63 pause. Pt denies chest pain, denies nausea and vomiting, not in respiratory distress. Asymptomatic. Will endorsed it to the morning RN. Will continue to monitor pt.

## 2017-05-06 NOTE — Progress Notes (Signed)
Subjective:  Feels better today with less shortness of breath.  Feels as if legs have gone down some.  With the addition of metolazone he is now diuresing and has lost 2 pounds.  Creatinine is mildly lowered today and BUN is stable.  Objective:  Vital Signs in the last 24 hours: BP (!) 153/52   Pulse 82   Temp 98.6 F (37 C) (Oral)   Resp 18   Ht 6\' 3"  (1.905 m)   Wt 135.9 kg (299 lb 9.6 oz) Comment: scale c  SpO2 94%   BMI 37.45 kg/m   Physical Exam: Obese elderly male sitting up in bed in no acute distress Lungs: crackles at bases Cardiac:  irregular rhythm, normal S1 and S2, no S3 Abdomen:  Soft, nontender, no masses Extremities:  Legs wrapped, 1+ edema above the wrappings  Intake/Output from previous day: 03/16 0701 - 03/17 0700 In: 840 [P.O.:840] Out: 2575 [Urine:2575] Weight Filed Weights   05/04/17 0600 05/05/17 0730 05/06/17 0416  Weight: (!) 136.8 kg (301 lb 9.6 oz) (!) 136.5 kg (301 lb) 135.9 kg (299 lb 9.6 oz)    Lab Results: Basic Metabolic Panel: Recent Labs    05/05/17 0432 05/06/17 0709  NA 137 138  K 3.5 3.2*  CL 106 105  CO2 20* 23  GLUCOSE 112* 116*  BUN 50* 49*  CREATININE 2.24* 2.13*    CBC: Recent Labs    05/04/17 0738  WBC 5.8  HGB 10.4*  HCT 32.3*  MCV 98.2  PLT 125*   BNP    Component Value Date/Time   BNP 723.6 (H) 05/02/2017 1500   PROTIME: Lab Results  Component Value Date   INR 1.83 03/02/2017   INR 2.2 09/27/2016   INR 2.8 09/07/2016    Telemetry: Atrial fibrillation with PVCs personally reviewed  Assessment/Plan:  1.  Acute on chronic combined systolic and diastolic heart failure-Now down 2 pounds  2.  Acute on chronic renal failure creatinine somewhat lower today 3.  CAD with previous multiple CPI 4.  Permanent atrial fibrillation  Recommendations:  Small dose of metolazone again today.  He is now diuresing and will see what effect this has along with the intravenous furosemide.    Kerry Hough  MD [facility]  05/06/2017, 11:40 AM

## 2017-05-07 DIAGNOSIS — N184 Chronic kidney disease, stage 4 (severe): Secondary | ICD-10-CM

## 2017-05-07 DIAGNOSIS — R0602 Shortness of breath: Secondary | ICD-10-CM

## 2017-05-07 LAB — BASIC METABOLIC PANEL
Anion gap: 11 (ref 5–15)
BUN: 51 mg/dL — AB (ref 6–20)
CHLORIDE: 101 mmol/L (ref 101–111)
CO2: 24 mmol/L (ref 22–32)
CREATININE: 2.2 mg/dL — AB (ref 0.61–1.24)
Calcium: 8.7 mg/dL — ABNORMAL LOW (ref 8.9–10.3)
GFR calc Af Amer: 32 mL/min — ABNORMAL LOW (ref >60)
GFR, EST NON AFRICAN AMERICAN: 27 mL/min — AB (ref >60)
GLUCOSE: 110 mg/dL — AB (ref 65–99)
Potassium: 3 mmol/L — ABNORMAL LOW (ref 3.5–5.1)
Sodium: 136 mmol/L (ref 135–145)

## 2017-05-07 MED ORDER — POTASSIUM CHLORIDE CRYS ER 20 MEQ PO TBCR: 40.0000 meq | EXTENDED_RELEASE_TABLET | Freq: Every day | ORAL | Status: DC

## 2017-05-07 MED ORDER — POTASSIUM CHLORIDE CRYS ER 20 MEQ PO TBCR
40.0000 meq | EXTENDED_RELEASE_TABLET | ORAL | Status: AC
  Administered 2017-05-07: 40 meq via ORAL

## 2017-05-07 NOTE — Plan of Care (Signed)
  Progressing Health Behavior/Discharge Planning: Ability to manage health-related needs will improve 05/07/2017 2027 - Progressing by Barton Dubois, RN Elimination: Will not experience complications related to bowel motility 05/07/2017 2027 - Progressing by Barton Dubois, RN Pt. States he is using the bathroom well and leg swelling ins going down

## 2017-05-07 NOTE — Care Management Important Message (Signed)
Important Message  Patient Details  Name: Terry Martinez. MRN: [id number] Date of Birth: May 06, 1940   Medicare Important Message Given:  Yes    Barb Merino Fransheska Willingham 05/07/2017, 1:43 PM

## 2017-05-07 NOTE — Care Management Important Message (Signed)
Important Message  Patient Details  Name: Terry Martinez. MRN: [id number] Date of Birth: 1940/05/25   Medicare Important Message Given:  Yes    Areliz Rothman 05/07/2017, 1:09 PM

## 2017-05-07 NOTE — Progress Notes (Signed)
Progress Note  Patient Name: Terry Martinez. Date of Encounter: 05/07/2017  Primary Cardiologist: Dr. Dorris Carnes  Subjective   Pt doing better today. Reports breathing and swelling have improved. Lots of questions regarding Dx and medications.    Inpatient Medications    Scheduled Meds: . allopurinol  100 mg Oral Daily  . aspirin EC  81 mg Oral Daily  . furosemide  80 mg Intravenous BID  . hydrALAZINE  12.5 mg Oral Q8H  . isosorbide mononitrate  15 mg Oral Daily  . metoprolol succinate  12.5 mg Oral Daily  . potassium chloride  20 mEq Oral Daily  . pravastatin  40 mg Oral q1800  . rivaroxaban  20 mg Oral Q supper   Continuous Infusions:  PRN Meds: acetaminophen, albuterol, nitroGLYCERIN, ondansetron (ZOFRAN) IV, polyethylene glycol   Vital Signs    Vitals:   05/06/17 0416 05/06/17 1025 05/06/17 2007 05/07/17 0423  BP: (!) 135/49 (!) 153/52 132/70 (!) 130/52  Pulse: 60 82 68 92  Resp: 18  20 20   Temp: 98.6 F (37 C)  98.2 F (36.8 C) 97.9 F (36.6 C)  TempSrc: Oral  Oral   SpO2: 94%  95% 94%  Weight: 299 lb 9.6 oz (135.9 kg)   295 lb 12.8 oz (134.2 kg)  Height:        Intake/Output Summary (Last 24 hours) at 05/07/2017 0935 Last data filed at 05/07/2017 8786 Gross per 24 hour  Intake 480 ml  Output 2950 ml  Net -2470 ml   Filed Weights   05/05/17 0730 05/06/17 0416 05/07/17 0423  Weight: (!) 301 lb (136.5 kg) 299 lb 9.6 oz (135.9 kg) 295 lb 12.8 oz (134.2 kg)    Physical Exam   General: Obese, NAD Skin: Warm, dry, intact  Head: Normocephalic, atraumatic,  clear, moist mucus membranes. Neck: Negative for carotid bruits. No JVD Lungs Diminished in bilateral bases. R>L. No wheezes, rales, or rhonchi. Breathing is unlabored. Cardiovascular: RRR with S1 S2. No murmurs, rubs, or gallops Abdomen: Soft, non-tender, non-distended with normoactive bowel sounds.  No obvious abdominal masses. MSK: Strength and tone appear normal for age. 5/5 in all  extremities Extremities: 2+ BLE edema. No clubbing or cyanosis. DP/PT pulses 1+ bilaterally Neuro: Alert and oriented. No focal deficits. No facial asymmetry. MAE spontaneously. Psych: Responds to questions appropriately with normal affect.    Labs    Chemistry Recent Labs  Lab 05/05/17 0432 05/06/17 0709 05/07/17 0348  NA 137 138 136  K 3.5 3.2* 3.0*  CL 106 105 101  CO2 20* 23 24  GLUCOSE 112* 116* 110*  BUN 50* 49* 51*  CREATININE 2.24* 2.13* 2.20*  CALCIUM 8.8* 8.9 8.7*  GFRNONAA 27* 28* 27*  GFRAA 31* 33* 32*  ANIONGAP 11 10 11      Hematology Recent Labs  Lab 05/02/17 1500 05/04/17 0738  WBC 6.3 5.8  RBC 3.53* 3.29*  HGB 11.0* 10.4*  HCT 34.4* 32.3*  MCV 97.5 98.2  MCH 31.2 31.6  MCHC 32.0 32.2  RDW 18.3* 18.8*  PLT 136* 125*    Cardiac EnzymesNo results for input(s): TROPONINI in the last 168 hours.  Recent Labs  Lab 05/02/17 1527 05/02/17 1858  TROPIPOC 0.01 0.00     BNP Recent Labs  Lab 05/02/17 1500  BNP 723.6*     DDimer No results for input(s): DDIMER in the last 168 hours.   Radiology    No results found.  Telemetry    05/07/2017  atrial fibrillation PVCs- Personally Reviewed  ECG    No new tracings thousand 05/07/2017- Personally Reviewed  Cardiac Studies   Echo 01/26/2017 LV EF: 35% - 40% Study Conclusions  - Left ventricle: The cavity size was moderately dilated. Systolic function was moderately reduced. The estimated ejection fraction was in the range of 35% to 40%. Wall motion was normal; there were no regional wall motion abnormalities. - Ventricular septum: The contour showed diastolic flattening and systolic flattening. - Aortic valve: Valve mobility was restricted. There was very mild stenosis. There was moderate regurgitation. - Aortic root: The aortic root was dilated measuring 49 mm. - Mitral valve: Calcified annulus. Mildly thickened leaflets . There was mild regurgitation. - Left atrium: The  atrium was severely dilated. - Right ventricle: The cavity size was severely dilated. Wall thickness was normal. Systolic function was severely reduced. - Right atrium: The atrium was severely dilated. - Tricuspid valve: There was moderate-severe regurgitation. - Pulmonic valve: There was moderate regurgitation. - Pulmonary arteries: Systolic pressure was moderately increased. PA peak pressure: 45 mm Hg (S). - Inferior vena cava: The vessel was dilated. The respirophasic diameter changes were blunted (<50%), consistent with elevated central venous pressure.  Impressions:  - When compared to the prior study from 05/26/2016 LVEF has decreased, now 35-40%. LV is moderately dilated. RV is severely dilated with severe systolic dysfunction. There is biatrial atrial dilatation. Aortic regurgitation is moderate, aortic root has further dilated at the sinus level measuring 49 mm, previously 47 mm. Moderate pulmonary hypertension.  Patient Profile     77 y.o. male with a PMH of chronic combine CHF (Last Echo 01/2017 with EF 35-45%), CAD s/p 6 PCI (last PCI 2014), PAD s/p aorta-bifemoral bypasswith multiple amputations, permanent atrial fibrillation,moderateAIby echo 01/2017,pulm HTN (VQ neg for PE per Dr. Ross/radiology review 01/2017), obesity,CKD stage III,chronic appearing anemia/thrombocytopenia who presented withDOE for several weeks. Admitted to cardiology for acute on chronic combined CHF, AKI on CKD, hypokalemia, pleural effusions in the setting of eating chicken and rice soup frequently. +20lb weight gain since last DC in 01/2017 (wt 285lb, admitted at 303lb). Has had recent increasing symptoms despite increasing home torsemide dosing.  Assessment & Plan    1.  Acute on chronic combined systolic and diastolic heart failure: -Last echocardiogram 01/2017 with LVEF 35-45% -On home torsemide (alternating days), BNP on admission>>723 -Chest x-ray with pleural  effusions and pulmonary edema -IV Lasix 80 mg BID with improvement -He has received two doses of Metolazone 2.5mg , last dose 05/06/17 -Weight, 295lb>>> 303lb on admission -I&O, net  -5.6 L, total output 3.1 L -Beta-blocker, hydralazine 12.5 mg, Imdur 15 mg; ACEI on hold in the setting AKI  2.  Acute on chronic renal failure stage II: -Cr, 2.20 today, up from 2.13 however improved from admission -Baseline creatinine 1.7 -Elevated in the setting of increased torsemide versus volume overload  4.  History of CAD with multiple PCI: -CAD status post 6 PCI procedures, last PCI 2014 -Denies chest pain -Troponin, negative -EKG, nonacute -ASA, statin  5.  Permanent atrial fibrillation: -Atrial fibrillation per telemetry review, rate controlled -Continue toprol-XL 12.5mg  -Continue Xarelto 20mg   -CHA2DS2VASc =5 (CHF, HTN, MI, age>75)    Signed, Kathyrn Drown NP-C [facility] Pager: [phone] 05/07/2017, 9:35 AM     For questions or updates, please contact   Please consult [email or web address] for contact info under Cardiology/STEMI. '

## 2017-05-08 ENCOUNTER — Inpatient Hospital Stay (HOSPITAL_COMMUNITY): Payer: Medicare Other

## 2017-05-08 LAB — BASIC METABOLIC PANEL
ANION GAP: 14 (ref 5–15)
BUN: 56 mg/dL — AB (ref 6–20)
CALCIUM: 8.9 mg/dL (ref 8.9–10.3)
CO2: 24 mmol/L (ref 22–32)
Chloride: 98 mmol/L — ABNORMAL LOW (ref 101–111)
Creatinine, Ser: 2.25 mg/dL — ABNORMAL HIGH (ref 0.61–1.24)
GFR calc Af Amer: 31 mL/min — ABNORMAL LOW (ref >60)
GFR calc non Af Amer: 27 mL/min — ABNORMAL LOW (ref >60)
GLUCOSE: 118 mg/dL — AB (ref 65–99)
POTASSIUM: 2.8 mmol/L — AB (ref 3.5–5.1)
Sodium: 136 mmol/L (ref 135–145)

## 2017-05-08 MED ORDER — PHENOL 1.4 % MT LIQD
1.0000 | OROMUCOSAL | Status: DC | PRN
  Administered 2017-05-08: 1 via OROMUCOSAL
  Filled 2017-05-08: qty 177

## 2017-05-08 MED ORDER — METOLAZONE 2.5 MG PO TABS
2.5000 mg | ORAL_TABLET | Freq: Every day | ORAL | Status: AC
  Administered 2017-05-08 – 2017-05-10 (×3): 2.5 mg via ORAL
  Filled 2017-05-08 (×3): qty 1

## 2017-05-08 MED ORDER — POTASSIUM CHLORIDE CRYS ER 20 MEQ PO TBCR
40.0000 meq | EXTENDED_RELEASE_TABLET | Freq: Two times a day (BID) | ORAL | Status: DC
  Administered 2017-05-08 (×2): 40 meq via ORAL
  Filled 2017-05-08 (×2): qty 2

## 2017-05-08 MED ORDER — GUAIFENESIN-DM 100-10 MG/5ML PO SYRP
5.0000 mL | ORAL_SOLUTION | ORAL | Status: DC | PRN
  Administered 2017-05-08 – 2017-05-12 (×2): 5 mL via ORAL
  Filled 2017-05-08 (×2): qty 5

## 2017-05-08 MED ORDER — IPRATROPIUM-ALBUTEROL 0.5-2.5 (3) MG/3ML IN SOLN: 3.0000 mL | Freq: Four times a day (QID) | RESPIRATORY_TRACT | Status: DC | PRN

## 2017-05-08 NOTE — Plan of Care (Signed)
  Progressing Elimination: Will not experience complications related to bowel motility 05/08/2017 1950 - Progressing by Barton Dubois, RN

## 2017-05-08 NOTE — Progress Notes (Addendum)
Progress Note  Patient Name: Terry Martinez. Date of Encounter: 05/08/2017  Primary Cardiologist: Dr. Dorris Carnes  Subjective   Breathing seems worse today. Bilateral upper and lower wheezing. Seems more fatigued. Denies CP.   Inpatient Medications    Scheduled Meds: . allopurinol  100 mg Oral Daily  . aspirin EC  81 mg Oral Daily  . furosemide  80 mg Intravenous BID  . hydrALAZINE  12.5 mg Oral Q8H  . isosorbide mononitrate  15 mg Oral Daily  . metoprolol succinate  12.5 mg Oral Daily  . potassium chloride  40 mEq Oral Daily  . pravastatin  40 mg Oral q1800  . rivaroxaban  20 mg Oral Q supper   Continuous Infusions:  PRN Meds: acetaminophen, albuterol, guaiFENesin-dextromethorphan, nitroGLYCERIN, ondansetron (ZOFRAN) IV, phenol, polyethylene glycol   Vital Signs    Vitals:   05/07/17 1037 05/07/17 2011 05/07/17 2100 05/08/17 0438  BP: (!) 145/61 (!) 147/50 (!) 145/54 140/73  Pulse: 70 67 (!) 58 74  Resp:  20 20 20   Temp:  97.8 F (36.6 C) 98.2 F (36.8 C) (!) 97.4 F (36.3 C)  TempSrc:  Oral Oral Oral  SpO2:  92% 93% 91%  Weight:    295 lb 11.2 oz (134.1 kg)  Height:        Intake/Output Summary (Last 24 hours) at 05/08/2017 0814 Last data filed at 05/08/2017 0962 Gross per 24 hour  Intake 720 ml  Output 1925 ml  Net -1205 ml   Filed Weights   05/06/17 0416 05/07/17 0423 05/08/17 0438  Weight: 299 lb 9.6 oz (135.9 kg) 295 lb 12.8 oz (134.2 kg) 295 lb 11.2 oz (134.1 kg)    Physical Exam   General: Well developed, well nourished, NAD Skin: Warm, dry, intact  Head: Normocephalic, atraumatic, clear, moist mucus membranes. Neck: Negative for carotid bruits. No JVD Lungs: BLU/BLL expiratory wheezing. No rales or rhonchi. Breathing is mildly labored Cardiovascular: RRR with S1 S2. No murmurs, rubs, or gallops Abdomen: Soft, non-tender, non-distended with normoactive bowel sounds. No obvious abdominal masses. MSK: Strength and tone appear normal for  age. 5/5 in all extremities Extremities: 2+ BLE edema to knees. No clubbing or cyanosis. DP/PT pulses 1+ bilaterally Neuro: Alert and oriented. No focal deficits. No facial asymmetry. MAE spontaneously. Psych: Responds to questions appropriately with normal affect.    Labs    Chemistry Recent Labs  Lab 05/06/17 0709 05/07/17 0348 05/08/17 0511  NA 138 136 136  K 3.2* 3.0* 2.8*  CL 105 101 98*  CO2 23 24 24   GLUCOSE 116* 110* 118*  BUN 49* 51* 56*  CREATININE 2.13* 2.20* 2.25*  CALCIUM 8.9 8.7* 8.9  GFRNONAA 28* 27* 27*  GFRAA 33* 32* 31*  ANIONGAP 10 11 14      Hematology Recent Labs  Lab 05/02/17 1500 05/04/17 0738  WBC 6.3 5.8  RBC 3.53* 3.29*  HGB 11.0* 10.4*  HCT 34.4* 32.3*  MCV 97.5 98.2  MCH 31.2 31.6  MCHC 32.0 32.2  RDW 18.3* 18.8*  PLT 136* 125*    Cardiac EnzymesNo results for input(s): TROPONINI in the last 168 hours.  Recent Labs  Lab 05/02/17 1527 05/02/17 1858  TROPIPOC 0.01 0.00     BNP Recent Labs  Lab 05/02/17 1500  BNP 723.6*     DDimer No results for input(s): DDIMER in the last 168 hours.   Radiology    No results found.  Telemetry    05/08/17 Atrial fibrillation  -  Personally Reviewed  ECG    No new tracings as of 05/08/2017- Personally Reviewed  Cardiac Studies   Echo 01/26/2017 LV EF: 35% - 40% Study Conclusions  - Left ventricle: The cavity size was moderately dilated. Systolic function was moderately reduced. The estimated ejection fraction was in the range of 35% to 40%. Wall motion was normal; there were no regional wall motion abnormalities. - Ventricular septum: The contour showed diastolic flattening and systolic flattening. - Aortic valve: Valve mobility was restricted. There was very mild stenosis. There was moderate regurgitation. - Aortic root: The aortic root was dilated measuring 49 mm. - Mitral valve: Calcified annulus. Mildly thickened leaflets . There was mild regurgitation. -  Left atrium: The atrium was severely dilated. - Right ventricle: The cavity size was severely dilated. Wall thickness was normal. Systolic function was severely reduced. - Right atrium: The atrium was severely dilated. - Tricuspid valve: There was moderate-severe regurgitation. - Pulmonic valve: There was moderate regurgitation. - Pulmonary arteries: Systolic pressure was moderately increased. PA peak pressure: 45 mm Hg (S). - Inferior vena cava: The vessel was dilated. The respirophasic diameter changes were blunted (<50%), consistent with elevated central venous pressure.  Impressions:  - When compared to the prior study from 05/26/2016 LVEF has decreased, now 35-40%. LV is moderately dilated. RV is severely dilated with severe systolic dysfunction. There is biatrial atrial dilatation. Aortic regurgitation is moderate, aortic root has further dilated at the sinus level measuring 49 mm, previously 47 mm. Moderate pulmonary hypertension.  Patient Profile     77 y.o. male with a PMH of chronic combine CHF (Last Echo 01/2017 with EF 35-45%), CAD s/p 6 PCI (last PCI 2014), PAD s/p aorta-bifemoral bypasswith multiple amputations, permanent atrial fibrillation,moderateAIby echo 01/2017,pulm HTN (VQ neg for PE per Dr. Ross/radiology review 01/2017), obesity,CKD stage III,chronic appearing anemia/thrombocytopenia who presented withDOE for several weeks. Admitted to cardiology for acute on chronic combined CHF, AKI on CKD, hypokalemia, pleural effusions in the setting of eating chicken and rice soup frequently. +20lb weight gain since last DC in 01/2017 (wt 285lb, admitted at 303lb). Has had recent increasing symptoms despite increasing home torsemide dosing.  Assessment & Plan    1.  Acute on chronic combined systolic and diastolic heart failure: -Last echocardiogram 01/2017 with LVEF 35-45% -On home torsemide (alternating days), BNP on admission, 723 -Chest x-ray  with pleural effusions and pulmonary edema -IV Lasix 80 mg BID with improvement -Add 3 doses of metolazone stating today see if improves diuresing  -Weight, 295lb>> unchanged from yesterday 05/07/2017.  Admission weight 303lb.  Weight at last office visit 02/28/2017 , 281lb -I&O, net -6.8 L.  Total output today 1.9 L -Will order CXR to further evaluate lung status -Beta-blocker, hydralazine 12.5 mg, Imdur 15 mg, ACEI on hold in the setting of AK I  2.  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  History of CAD with multiple PCI: -CAD s/p PCI x6 procedures, last PCI 2014 -Denies chest pain -Troponin, negative -EKG, nonacute -ASA, statin  4.  Permanent atrial fibrillation: -Atrial fibrillation per telemetry review, rate controlled -Continue Toprol-XL 12.5 mg -Continue Xarelto 20 mg -CHA2DS2VASc =5 (CHF, HTN, MI, age>75)  5. Hypokalemia: -K+, 2.8 today -KDUR increased to 30meq BID -Will re-assess in the AM    Signed, Kathyrn Drown NP-C [place] Pager: [phone] 05/08/2017, 8:14 AM  For questions or updates, please contact   Please consult [email or web address] for contact info under Cardiology/STEMI.

## 2017-05-08 NOTE — Progress Notes (Signed)
Physical Therapy Treatment Patient Details Name: Terry Martinez. MRN: [id number] DOB: 08-Dec-1940 Today's Date: 05/08/2017    History of Present Illness 77 y/o male admitted secondary to CHF exacerbation, acute renal insufficiency, permanent a-fib.     PT Comments    Pt progressing well towards functional mobility goals. Pt min assist for bed mobility, min guard for transfers, and supervision for ambulation ~250 ft with rollator without requiring a rest break. Pt talking comfortably throughout session and shows no signs/symptoms of difficulty breathing. Pt will need a rollator prior to d/c. Will continue to follow acutely and progress as tolerated.    Follow Up Recommendations  Home health PT;Supervision/Assistance - 24 hour     Equipment Recommendations  Other (comment)(rollator)    Recommendations for Other Services       Precautions / Restrictions Precautions Precautions: Fall Restrictions Weight Bearing Restrictions: No    Mobility  Bed Mobility Overal bed mobility: Needs Assistance Bed Mobility: Supine to Sit     Supine to sit: Min assist     General bed mobility comments: min assist to elevate trunk, uses bed rail to assist  Transfers Overall transfer level: Needs assistance Equipment used: 4-wheeled walker Transfers: Sit to/from Stand Sit to Stand: Min guard         General transfer comment: uses rollator, VCs for proper hand placement and engaging brakes on rollator, increased time to rise  Ambulation/Gait Ambulation/Gait assistance: Supervision Ambulation Distance (Feet): 250 Feet Assistive device: 4-wheeled walker Gait Pattern/deviations: Step-through pattern;Decreased stride length;Wide base of support Gait velocity: reduced Gait velocity interpretation: Below normal speed for age/gender General Gait Details: supervision for safety, pt walking slower than baseline speed   Stairs            Wheelchair Mobility    Modified Rankin (Stroke  Patients Only)       Balance Overall balance assessment: Mild deficits observed, not formally tested                                          Cognition Arousal/Alertness: Awake/alert Behavior During Therapy: WFL for tasks assessed/performed Overall Cognitive Status: Within Functional Limits for tasks assessed                                        Exercises      General Comments General comments (skin integrity, edema, etc.): pt talking normally throughout session without any signs of SOB or difficulty breathing      Pertinent Vitals/Pain Pain Assessment: Faces Faces Pain Scale: Hurts a little bit Pain Location: L wrist(when weight bearing, 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) Pain Descriptors / Indicators: Aching Pain Intervention(s): Monitored during session;Limited activity within patient's tolerance;Repositioned    Home Living                      Prior Function            PT Goals (current goals can now be found in the care plan section) Acute Rehab PT Goals Patient Stated Goal: to get home PT Goal Formulation: With patient/family Time For Goal Achievement: 05/18/17 Potential to Achieve Goals: Good Progress towards PT goals: Progressing toward goals    Frequency    Min 3X/week      PT Plan  Current plan remains appropriate    Co-evaluation              AM-PAC PT "6 Clicks" Daily Activity  Outcome Measure  Difficulty turning over in bed (including adjusting bedclothes, sheets and blankets)?: None Difficulty moving from lying on back to sitting on the side of the bed? : Unable Difficulty sitting down on and standing up from a chair with arms (e.g., wheelchair, bedside commode, etc,.)?: Unable Help needed moving to and from a bed to chair (including a wheelchair)?: A Little Help needed walking in hospital room?: None Help needed climbing 3-5 steps with a railing? : None 6 Click Score: 17     End of Session Equipment Utilized During Treatment: Gait belt Activity Tolerance: Patient tolerated treatment well Patient left: in chair;with call bell/phone within reach Nurse Communication: Mobility status PT Visit Diagnosis: Unsteadiness on feet (R26.81);Other abnormalities of gait and mobility (R26.89);Muscle weakness (generalized) (M62.81);Difficulty in walking, not elsewhere classified (R26.2);Apraxia (R48.2);History of falling (Z91.81);Other symptoms and signs involving the nervous system (R29.898);BPPV;Dizziness and giddiness (R42);Adult, failure to thrive (R62.7)     Time: 1646-1700 PT Time Calculation (min) (ACUTE ONLY): 14 min  Charges:  $Gait Training: 8-22 mins                    G Codes:       Vic Ripper, SPT   Vic Ripper 05/08/2017, 5:23 PM

## 2017-05-09 ENCOUNTER — Encounter (HOSPITAL_COMMUNITY): Payer: Medicare Other

## 2017-05-09 ENCOUNTER — Ambulatory Visit: Payer: Medicare Other | Admitting: Vascular Surgery

## 2017-05-09 DIAGNOSIS — J441 Chronic obstructive pulmonary disease with (acute) exacerbation: Secondary | ICD-10-CM

## 2017-05-09 LAB — BASIC METABOLIC PANEL
ANION GAP: 10 (ref 5–15)
BUN: 56 mg/dL — AB (ref 6–20)
CALCIUM: 8.9 mg/dL (ref 8.9–10.3)
CO2: 27 mmol/L (ref 22–32)
Chloride: 99 mmol/L — ABNORMAL LOW (ref 101–111)
Creatinine, Ser: 2.39 mg/dL — ABNORMAL HIGH (ref 0.61–1.24)
GFR calc Af Amer: 29 mL/min — ABNORMAL LOW (ref >60)
GFR, EST NON AFRICAN AMERICAN: 25 mL/min — AB (ref >60)
GLUCOSE: 130 mg/dL — AB (ref 65–99)
Potassium: 3 mmol/L — ABNORMAL LOW (ref 3.5–5.1)
Sodium: 136 mmol/L (ref 135–145)

## 2017-05-09 MED ORDER — PREDNISONE 50 MG PO TABS
60.0000 mg | ORAL_TABLET | Freq: Every day | ORAL | Status: AC
  Administered 2017-05-09 – 2017-05-11 (×3): 60 mg via ORAL
  Filled 2017-05-09 (×3): qty 1

## 2017-05-09 MED ORDER — POTASSIUM CHLORIDE CRYS ER 20 MEQ PO TBCR
80.0000 meq | EXTENDED_RELEASE_TABLET | Freq: Two times a day (BID) | ORAL | Status: DC
  Administered 2017-05-09 – 2017-05-12 (×7): 80 meq via ORAL
  Filled 2017-05-09 (×9): qty 4

## 2017-05-09 MED ORDER — IPRATROPIUM-ALBUTEROL 0.5-2.5 (3) MG/3ML IN SOLN
3.0000 mL | Freq: Four times a day (QID) | RESPIRATORY_TRACT | Status: AC
  Administered 2017-05-09 (×2): 3 mL via RESPIRATORY_TRACT
  Filled 2017-05-09 (×2): qty 3

## 2017-05-09 NOTE — Plan of Care (Signed)
Pt progressing

## 2017-05-09 NOTE — Progress Notes (Signed)
Patient resting comfortably during shift report. Denies complaints.  

## 2017-05-09 NOTE — Progress Notes (Signed)
Progress Note  Patient Name: Terry Martinez. Date of Encounter: 05/09/2017  Primary Cardiologist: Dr. Dorris Carnes  Subjective   Diuresed another 1.7L negative overnight - CXR yesterday showed cardiomegaly with pulmonary vascular congestion. Creatinine up further at 2.39. Potasium remains low, despite repletion.  Inpatient Medications    Scheduled Meds: . allopurinol  100 mg Oral Terry  . aspirin EC  81 mg Oral Terry  . furosemide  80 mg Intravenous BID  . hydrALAZINE  12.5 mg Oral Q8H  . isosorbide mononitrate  15 mg Oral Terry  . metolazone  2.5 mg Oral Terry  . metoprolol succinate  12.5 mg Oral Terry  . potassium chloride  40 mEq Oral BID  . pravastatin  40 mg Oral q1800  . rivaroxaban  20 mg Oral Q supper   Continuous Infusions:  PRN Meds: acetaminophen, guaiFENesin-dextromethorphan, ipratropium-albuterol, nitroGLYCERIN, ondansetron (ZOFRAN) IV, phenol, polyethylene glycol   Vital Signs    Vitals:   05/08/17 0939 05/08/17 1121 05/08/17 1941 05/09/17 0416  BP: 117/80 (!) 143/57 134/67 (!) 149/93  Pulse: 87 74 (!) 48 86  Resp:  20 18 18   Temp:  (!) 97.3 F (36.3 C) 98 F (36.7 C) 97.8 F (36.6 C)  TempSrc:  Oral Oral Oral  SpO2:  91% 92% 91%  Weight:    293 lb 1.6 oz (132.9 kg)  Height:        Intake/Output Summary (Last 24 hours) at 05/09/2017 0913 Last data filed at 05/09/2017 0300 Gross per 24 hour  Intake 240 ml  Output 1990 ml  Net -1750 ml   Filed Weights   05/07/17 0423 05/08/17 0438 05/09/17 0416  Weight: 295 lb 12.8 oz (134.2 kg) 295 lb 11.2 oz (134.1 kg) 293 lb 1.6 oz (132.9 kg)    Physical Exam   General: Well developed, well nourished, NAD Skin: Warm, dry, intact  Head: Normocephalic, atraumatic, clear, moist mucus membranes. Neck: Negative for carotid bruits. No JVD Lungs:  Cardiovascular: RRR with S1 S2. No murmurs, rubs, or gallops Abdomen: Soft, non-tender, non-distended with normoactive bowel sounds. No obvious abdominal  masses. MSK: Strength and tone appear normal for age. 5/5 in all extremities Extremities: 2+ BLE edema to knees. No clubbing or cyanosis. DP/PT pulses 1+ bilaterally Neuro: Alert and oriented. No focal deficits. No facial asymmetry. MAE spontaneously. Psych: Responds to questions appropriately with normal affect.    Labs    Chemistry Recent Labs  Lab 05/07/17 0348 05/08/17 0511 05/09/17 0448  NA 136 136 136  K 3.0* 2.8* 3.0*  CL 101 98* 99*  CO2 24 24 27   GLUCOSE 110* 118* 130*  BUN 51* 56* 56*  CREATININE 2.20* 2.25* 2.39*  CALCIUM 8.7* 8.9 8.9  GFRNONAA 27* 27* 25*  GFRAA 32* 31* 29*  ANIONGAP 11 14 10      Hematology Recent Labs  Lab 05/02/17 1500 05/04/17 0738  WBC 6.3 5.8  RBC 3.53* 3.29*  HGB 11.0* 10.4*  HCT 34.4* 32.3*  MCV 97.5 98.2  MCH 31.2 31.6  MCHC 32.0 32.2  RDW 18.3* 18.8*  PLT 136* 125*    Cardiac EnzymesNo results for input(s): TROPONINI in the last 168 hours.  Recent Labs  Lab 05/02/17 1527 05/02/17 1858  TROPIPOC 0.01 0.00     BNP Recent Labs  Lab 05/02/17 1500  BNP 723.6*     DDimer No results for input(s): DDIMER in the last 168 hours.   Radiology    Dg Chest [facility]  Result Date: 05/08/2017 CLINICAL DATA:  Shortness of breath, cough EXAM: PORTABLE CHEST 1 VIEW COMPARISON:  05/02/2017 FINDINGS: There is mild bilateral interstitial prominence. There is no focal parenchymal opacity. There is no pleural effusion or pneumothorax. There is stable cardiomegaly. There is thoracic aortic atherosclerosis. The osseous structures are unremarkable. IMPRESSION: Cardiomegaly with mild pulmonary vascular congestion. Electronically Signed   By: Kathreen Devoid   On: 05/08/2017 10:21    Telemetry    Rate controlled a-fib  - Personally Reviewed  ECG    N/A  Cardiac Studies   Echo 01/26/2017 LV EF: 35% - 40% Study Conclusions  - Left ventricle: The cavity size was moderately dilated. Systolic function was moderately reduced. The  estimated ejection fraction was in the range of 35% to 40%. Wall motion was normal; there were no regional wall motion abnormalities. - Ventricular septum: The contour showed diastolic flattening and systolic flattening. - Aortic valve: Valve mobility was restricted. There was very mild stenosis. There was moderate regurgitation. - Aortic root: The aortic root was dilated measuring 49 mm. - Mitral valve: Calcified annulus. Mildly thickened leaflets . There was mild regurgitation. - Left atrium: The atrium was severely dilated. - Right ventricle: The cavity size was severely dilated. Wall thickness was normal. Systolic function was severely reduced. - Right atrium: The atrium was severely dilated. - Tricuspid valve: There was moderate-severe regurgitation. - Pulmonic valve: There was moderate regurgitation. - Pulmonary arteries: Systolic pressure was moderately increased. PA peak pressure: 45 mm Hg (S). - Inferior vena cava: The vessel was dilated. The respirophasic diameter changes were blunted (<50%), consistent with elevated central venous pressure.  Impressions:  - When compared to the prior study from 05/26/2016 LVEF has decreased, now 35-40%. LV is moderately dilated. RV is severely dilated with severe systolic dysfunction. There is biatrial atrial dilatation. Aortic regurgitation is moderate, aortic root has further dilated at the sinus level measuring 49 mm, previously 47 mm. Moderate pulmonary hypertension.  Patient Profile     77 y.o. male with a PMH of chronic combine CHF (Last Echo 01/2017 with EF 35-45%), CAD s/p 6 PCI (last PCI 2014), PAD s/p aorta-bifemoral bypasswith multiple amputations, permanent atrial fibrillation,moderateAIby echo 01/2017,pulm HTN (VQ neg for PE per Dr. Ross/radiology review 01/2017), obesity,CKD stage III,chronic appearing anemia/thrombocytopenia who presented withDOE for several weeks. Admitted to  cardiology for acute on chronic combined CHF, AKI on CKD, hypokalemia, pleural effusions in the setting of eating chicken and rice soup frequently. +20lb weight gain since last DC in 01/2017 (wt 285lb, admitted at 303lb). Has had recent increasing symptoms despite increasing home torsemide dosing.  Assessment & Plan    1.  Acute on chronic combined systolic and diastolic heart failure: -Last echocardiogram 01/2017 with LVEF 35-45% -On home torsemide (alternating days), BNP on admission, 723 -Chest x-ray with pleural effusions and pulmonary edema -IV Lasix 80 mg BID with improvement -Add 3 doses of metolazone stating today see if improves diuresing  -Weight, 295lb>> unchanged from yesterday 05/07/2017.  Admission weight 303lb.  Weight at last office visit 02/28/2017 , 281lb -Net negative almost 2L More overnight with metolazone, continue diuresis.  2.  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  3.  History of CAD with multiple PCI: -CAD s/p PCI x6 procedures, last PCI 2014 -Denies chest pain -Troponin, negative -EKG, nonacute -ASA, statin  4.  Permanent atrial  fibrillation: -Atrial fibrillation per telemetry review, rate controlled -Continue Toprol-XL 12.5 mg -Continue Xarelto 20 mg -CHA2DS2VASc =5 (CHF, HTN, MI, age>75)  5. Hypokalemia: -K+, 3.0 -Will increase KDUR to 80 MEQ BID  6. Acute on chronic COPD - Duoneb q6hrs for 24 hours -productive cough with thick sputum - will give steroid taper (60 mg x 3 days, the 40 mg x 3 days, then 20 mg x 3 days, then stop). - will need combination inhaler at discharge  Pixie Casino, MD, [place], [place] Director of the [facility] Diplomate of the American Board of Clinical Lipidology Attending  Cardiologist  Direct Dial: [phone]  Fax: [phone]  Website:  [email or web address]  05/09/2017, 9:13 AM

## 2017-05-10 ENCOUNTER — Ambulatory Visit: Payer: Medicare Other | Admitting: *Deleted

## 2017-05-10 ENCOUNTER — Other Ambulatory Visit: Payer: Medicare Other

## 2017-05-10 DIAGNOSIS — I509 Heart failure, unspecified: Secondary | ICD-10-CM

## 2017-05-10 DIAGNOSIS — J441 Chronic obstructive pulmonary disease with (acute) exacerbation: Secondary | ICD-10-CM

## 2017-05-10 LAB — BASIC METABOLIC PANEL
ANION GAP: 14 (ref 5–15)
BUN: 63 mg/dL — ABNORMAL HIGH (ref 6–20)
CALCIUM: 8.9 mg/dL (ref 8.9–10.3)
CHLORIDE: 99 mmol/L — AB (ref 101–111)
CO2: 24 mmol/L (ref 22–32)
Creatinine, Ser: 2.56 mg/dL — ABNORMAL HIGH (ref 0.61–1.24)
GFR calc non Af Amer: 23 mL/min — ABNORMAL LOW (ref >60)
GFR, EST AFRICAN AMERICAN: 26 mL/min — AB (ref >60)
Glucose, Bld: 145 mg/dL — ABNORMAL HIGH (ref 65–99)
Potassium: 3.9 mmol/L (ref 3.5–5.1)
Sodium: 137 mmol/L (ref 135–145)

## 2017-05-10 LAB — CBC
HCT: 32.4 % — ABNORMAL LOW (ref 39.0–52.0)
Hemoglobin: 9.9 g/dL — ABNORMAL LOW (ref 13.0–17.0)
MCH: 30.7 pg (ref 26.0–34.0)
MCHC: 30.6 g/dL (ref 30.0–36.0)
MCV: 100.3 fL — ABNORMAL HIGH (ref 78.0–100.0)
Platelets: 124 10*3/uL — ABNORMAL LOW (ref 150–400)
RBC: 3.23 MIL/uL — ABNORMAL LOW (ref 4.22–5.81)
RDW: 18.3 % — AB (ref 11.5–15.5)
WBC: 6 10*3/uL (ref 4.0–10.5)

## 2017-05-10 MED ORDER — MOMETASONE FURO-FORMOTEROL FUM 100-5 MCG/ACT IN AERO
2.0000 | INHALATION_SPRAY | Freq: Two times a day (BID) | RESPIRATORY_TRACT | Status: DC
  Filled 2017-05-10: qty 8.8

## 2017-05-10 MED ORDER — MOMETASONE FURO-FORMOTEROL FUM 100-5 MCG/ACT IN AERO
2.0000 | INHALATION_SPRAY | Freq: Two times a day (BID) | RESPIRATORY_TRACT | Status: DC
  Administered 2017-05-10 – 2017-05-13 (×6): 2 via RESPIRATORY_TRACT
  Filled 2017-05-10: qty 8.8

## 2017-05-10 NOTE — Progress Notes (Signed)
Progress Note  Patient Name: Terry Martinez. Date of Encounter: 05/10/2017  Primary Cardiologist: Dr. Dorris Carnes  Subjective   Pt feels better overnight. Still coughing with production. Creatinine continues to climb, 2.56 today. Only moderate output since yesterday AM (-8.2L>>>-9L>.1.1L total output). He is actually positive this AM (235ml).  Inpatient Medications    Scheduled Meds: . allopurinol  100 mg Oral Daily  . aspirin EC  81 mg Oral Daily  . furosemide  80 mg Intravenous BID  . hydrALAZINE  12.5 mg Oral Q8H  . isosorbide mononitrate  15 mg Oral Daily  . metolazone  2.5 mg Oral Daily  . metoprolol succinate  12.5 mg Oral Daily  . potassium chloride  80 mEq Oral BID  . pravastatin  40 mg Oral q1800  . predniSONE  60 mg Oral Q breakfast  . rivaroxaban  20 mg Oral Q supper   Continuous Infusions:  PRN Meds: acetaminophen, guaiFENesin-dextromethorphan, nitroGLYCERIN, ondansetron (ZOFRAN) IV, phenol, polyethylene glycol   Vital Signs    Vitals:   05/09/17 2033 05/09/17 2129 05/09/17 2209 05/10/17 0409  BP: (!) 128/55  (!) 145/57 (!) 143/70  Pulse: 64  62 80  Resp: 18   20  Temp: 97.8 F (36.6 C)   97.8 F (36.6 C)  TempSrc: Oral   Oral  SpO2: 94% 95%  93%  Weight:    297 lb 3.2 oz (134.8 kg)  Height:        Intake/Output Summary (Last 24 hours) at 05/10/2017 0858 Last data filed at 05/10/2017 0845 Gross per 24 hour  Intake 910 ml  Output 1100 ml  Net -190 ml   Filed Weights   05/08/17 0438 05/09/17 0416 05/10/17 0409  Weight: 295 lb 11.2 oz (134.1 kg) 293 lb 1.6 oz (132.9 kg) 297 lb 3.2 oz (134.8 kg)    Physical Exam   General: Well developed, well nourished, NAD Skin: Warm, dry, intact  Head: Normocephalic, atraumatic, clear, moist mucus membranes. Neck: Negative for carotid bruits. No JVD Lungs: BU/LL expiratory wheezes. No rales or rhonchi. Breathing is unlabored. Cardiovascular: Irregularly irregular with S1 S2. No murmurs, rubs, or  gallops Abdomen: Soft, non-tender, non-distended with normoactive bowel sounds. No obvious abdominal masses. MSK: Strength and tone appear normal for age. 5/5 in all extremities Extremities:2+ BLE edema. No clubbing or cyanosis. DP/PT pulses 1+ bilaterally Neuro: Alert and oriented. No focal deficits. No facial asymmetry. MAE spontaneously. Psych: Responds to questions appropriately with normal affect.    Labs    Chemistry Recent Labs  Lab 05/08/17 0511 05/09/17 0448 05/10/17 0322  NA 136 136 137  K 2.8* 3.0* 3.9  CL 98* 99* 99*  CO2 24 27 24   GLUCOSE 118* 130* 145*  BUN 56* 56* 63*  CREATININE 2.25* 2.39* 2.56*  CALCIUM 8.9 8.9 8.9  GFRNONAA 27* 25* 23*  GFRAA 31* 29* 26*  ANIONGAP 14 10 14      Hematology Recent Labs  Lab 05/04/17 0738 05/10/17 0322  WBC 5.8 6.0  RBC 3.29* 3.23*  HGB 10.4* 9.9*  HCT 32.3* 32.4*  MCV 98.2 100.3*  MCH 31.6 30.7  MCHC 32.2 30.6  RDW 18.8* 18.3*  PLT 125* 124*    Cardiac EnzymesNo results for input(s): TROPONINI in the last 168 hours. No results for input(s): TROPIPOC in the last 168 hours.   BNPNo results for input(s): BNP, PROBNP in the last 168 hours.   DDimer No results for input(s): DDIMER in the last 168 hours.  Radiology    Dg Chest Port 1 View  Result Date: 05/08/2017 CLINICAL DATA:  Shortness of breath, cough EXAM: PORTABLE CHEST 1 VIEW COMPARISON:  05/02/2017 FINDINGS: There is mild bilateral interstitial prominence. There is no focal parenchymal opacity. There is no pleural effusion or pneumothorax. There is stable cardiomegaly. There is thoracic aortic atherosclerosis. The osseous structures are unremarkable. IMPRESSION: Cardiomegaly with mild pulmonary vascular congestion. Electronically Signed   By: Kathreen Devoid   On: 05/08/2017 10:21    Telemetry    05/10/17 Atrial fibrillation with multiple PVC's overnight and this AM - Personally Reviewed  ECG     No new tracings as of 05/10/17- Personally  Reviewed  Cardiac Studies   Echo 01/26/2017 LV EF: 35% - 40% Study Conclusions  - Left ventricle: The cavity size was moderately dilated. Systolic function was moderately reduced. The estimated ejection fraction was in the range of 35% to 40%. Wall motion was normal; there were no regional wall motion abnormalities. - Ventricular septum: The contour showed diastolic flattening and systolic flattening. - Aortic valve: Valve mobility was restricted. There was very mild stenosis. There was moderate regurgitation. - Aortic root: The aortic root was dilated measuring 49 mm. - Mitral valve: Calcified annulus. Mildly thickened leaflets . There was mild regurgitation. - Left atrium: The atrium was severely dilated. - Right ventricle: The cavity size was severely dilated. Wall thickness was normal. Systolic function was severely reduced. - Right atrium: The atrium was severely dilated. - Tricuspid valve: There was moderate-severe regurgitation. - Pulmonic valve: There was moderate regurgitation. - Pulmonary arteries: Systolic pressure was moderately increased. PA peak pressure: 45 mm Hg (S). - Inferior vena cava: The vessel was dilated. The respirophasic diameter changes were blunted (<50%), consistent with elevated central venous pressure.  Impressions:  - When compared to the prior study from 05/26/2016 LVEF has decreased, now 35-40%. LV is moderately dilated. RV is severely dilated with severe systolic dysfunction. There is biatrial atrial dilatation. Aortic regurgitation is moderate, aortic root has further dilated at the sinus level measuring 49 mm, previously 47 mm. Moderate pulmonary hypertension.  Patient Profile     77 y.o. male with a PMH of chronic combine CHF (Last Echo 01/2017 with EF 35-45%), CAD s/p 6 PCI (last PCI 2014), PAD s/p aorta-bifemoral bypasswith multiple amputations, permanent atrial fibrillation,moderateAIby echo  01/2017,pulm HTN (VQ neg for PE per Dr. Ross/radiology review 01/2017), obesity,CKD stage III,chronic appearing anemia/thrombocytopenia who presented withDOE for several weeks. Admitted to cardiology for acute on chronic combined CHF, AKI on CKD, hypokalemia, pleural effusions in the setting of eating chicken and rice soup frequently. +20lb weight gain since last DC in 01/2017 (wt 285lb, admitted at 303lb). Has had recent increasing symptoms despite increasing home torsemide dosing.  Assessment & Plan    1.  Acute on chronic combined systolic and diastolic heart failure: -Last echocardiogram 01/2017 with LVEF 35-45% -Torsemide x 3 doses added 05/08/17 for further diuresis>order complete -Chest x-ray with vascular congestion 05/08/17 -IV Lasix 80 mg BID with improvement -Weight, 297lb>> up from 293lb yesterday. Admission weight 303lb.  Weight at last office visit 02/28/2017, 281lb -I&O, net negative 729ml since yesterday, total output 1.1L (net -8.2L yesterday to net 9L today)   2.  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 Metolazone order complete>>re-assess fluid volume status.  -Creatinine continues to rise -Baseline creatinine 1.7??  3.  History of CAD with multiple PCI: -CAD s/p PCI  x6 procedures, last PCI 2014 -Denies chest pain -Troponin, negative -EKG, nonacute -ASA, statin  4.  Permanent atrial fibrillation: -Multiple PVC's? Will repeat EKG this AM -Atrial fibrillation per telemetry review, rate controlled -Continue Toprol-XL 12.5 mg -Continue Xarelto 20 mg -CHA2DS2VASc =5 (CHF, HTN, MI, age>75)  5. Hypokalemia: -K+, 3.9 today>stable -KDUR to 80 MEQ BID  6. Acute on chronic COPD: -Duoneb added 05/09/17 for productive cough -Steroid taper, 60mg  x 3 days, 40mg  x 3 days, 20mg  x 3 days, then stop -Will need combo inhaler at D/C   Signed, Kathyrn Drown NP-C [place] Pager:  [phone] 05/10/2017, 8:58 AM     For questions or updates, please contact   Please consult [email or web address] for contact info under Cardiology/STEMI.

## 2017-05-10 NOTE — Care Management Note (Addendum)
Case Management Note  Patient Details  Name: Terry Martinez. MRN: [id number] Date of Birth: [birth year]  Subjective/Objective:   CHF                Action/Plan:  Patient lives at home with his sister and brother in [place]; Primary Care Provider: Lovenia Kim, MD; has private insurance with [employer]; with prescription drug coverage; pharmacy of choice is [facility]; patient reports no problem getting his medication; DME - rollater at home; he is requesting a Nebulizer machine for home; Attending MD if in agreement please enter order in Epic; [place] choice offered, pt chose [facility]; [place] with [facility] called for arrangements; CM will continue to follow for progression of care.  2:38 pm- Received message that a family member works with [facility] and they would like to change [facility] agency to [facility]; Dan with [facility] called as requesed; [facility] cancelled. Mindi Slicker RN,MHA,BSN  Expected Discharge Date:    possibly 05/11/2017              Expected Discharge Plan:  [place]  Discharge planning Services  CM Consult  Choice offered to:  Patient  HH Arranged:  RN, Disease Management, PT HH Agency: [facility] Status of Service:  In process, will continue to follow  Sherrilyn Rist [phone] 05/10/2017, 11:02 AM

## 2017-05-10 NOTE — Progress Notes (Signed)
Physical Therapy Treatment Patient Details Name: Terry Martinez. MRN: [id number] DOB: [birth year] Today's Date: 05/10/2017    History of Present Illness 77 y/o male admitted secondary to CHF exacerbation, acute renal insufficiency, permanent a-fib. Pt with significant PMH of PAF, PAD, HTN, gout, DVT, COPD, CKD, aortic insufficiency, anemia, aortobifemoral bypass, lumbar spine surgery, multiple LE bypass grafts, and multiple foot/toe amputations.    PT Comments    Pt is progressing well with gait and mobiltiy, however, he still would benefit from rollator at d/c.  He is hopeful to go home tomorrow.  PT to follow acutely until d/c confirmed.      Follow Up Recommendations  Home health PT;Supervision/Assistance - 24 hour     Equipment Recommendations  Other (comment)(rollator- 4 wheel RW with seat (bariatric, please))    Recommendations for Other Services   NA     Precautions / Restrictions Precautions Precautions: Fall    Mobility  Bed Mobility Overal bed mobility: Needs Assistance Bed Mobility: Supine to Sit     Supine to sit: Modified independent (Device/Increase time);HOB elevated Sit to supine: Modified independent (Device/Increase time);HOB elevated   General bed mobility comments: Pt able to get into and out of bed given extra time and HOB elevated.  He heavily relies on rail for leverage at trunk.   Transfers Overall transfer level: Needs assistance Equipment used: 4-wheeled walker Transfers: Sit to/from Stand Sit to Stand: Supervision;From elevated surface         General transfer comment: supervision from elevated bed, unable to get up from lower bed with multiple attempts.   Ambulation/Gait Ambulation/Gait assistance: Supervision Ambulation Distance (Feet): 250 Feet Assistive device: 4-wheeled walker Gait Pattern/deviations: Step-through pattern;Trunk flexed     General Gait Details: supervision for safety, breathing is actually less dyspnic when pt is  up and walking which makes me think that his abdominal girth is pushing up on his diaphram in sitting increasing his DOE in sitting.              Cognition Arousal/Alertness: Awake/alert Behavior During Therapy: WFL for tasks assessed/performed Overall Cognitive Status: Within Functional Limits for tasks assessed                                               Pertinent Vitals/Pain Pain Assessment: No/denies pain           PT Goals (current goals can now be found in the care plan section) Acute Rehab PT Goals Patient Stated Goal: to get home Progress towards PT goals: Progressing toward goals    Frequency    Min 3X/week      PT Plan Current plan remains appropriate       AM-PAC PT "6 Clicks" Daily Activity  Outcome Measure  Difficulty turning over in bed (including adjusting bedclothes, sheets and blankets)?: A Little Difficulty moving from lying on back to sitting on the side of the bed? : A Little Difficulty sitting down on and standing up from a chair with arms (e.g., wheelchair, bedside commode, etc,.)?: None Help needed moving to and from a bed to chair (including a wheelchair)?: None Help needed walking in hospital room?: None Help needed climbing 3-5 steps with a railing? : A Little 6 Click Score: 21    End of Session   Activity Tolerance: Patient tolerated treatment well Patient left: in bed;Other (comment)(seated EOB eating dinner)  PT Visit Diagnosis: Unsteadiness on feet (R26.81);Other abnormalities of gait and mobility (R26.89);Muscle weakness (generalized) (M62.81);Difficulty in walking, not elsewhere classified (R26.2);Apraxia (R48.2);History of falling (Z91.81);Other symptoms and signs involving the nervous system (R29.898);BPPV;Dizziness and giddiness (R42);Adult, failure to thrive (R62.7)     Time: 4270-6237 PT Time Calculation (min) (ACUTE ONLY): 21 min  Charges:  $Gait Training: 8-22 mins          Sybil Shrader B. Beltrami,  [place], DPT [phone]           05/10/2017, 5:44 PM

## 2017-05-11 ENCOUNTER — Other Ambulatory Visit: Payer: Self-pay | Admitting: *Deleted

## 2017-05-11 DIAGNOSIS — I481 Persistent atrial fibrillation: Secondary | ICD-10-CM

## 2017-05-11 LAB — BASIC METABOLIC PANEL
Anion gap: 11 (ref 5–15)
BUN: 73 mg/dL — ABNORMAL HIGH (ref 6–20)
CALCIUM: 9.2 mg/dL (ref 8.9–10.3)
CO2: 26 mmol/L (ref 22–32)
CREATININE: 2.78 mg/dL — AB (ref 0.61–1.24)
Chloride: 101 mmol/L (ref 101–111)
GFR calc Af Amer: 24 mL/min — ABNORMAL LOW (ref >60)
GFR calc non Af Amer: 21 mL/min — ABNORMAL LOW (ref >60)
Glucose, Bld: 136 mg/dL — ABNORMAL HIGH (ref 65–99)
POTASSIUM: 4.9 mmol/L (ref 3.5–5.1)
Sodium: 138 mmol/L (ref 135–145)

## 2017-05-11 LAB — GLUCOSE, CAPILLARY: GLUCOSE-CAPILLARY: 157 mg/dL — AB (ref 65–99)

## 2017-05-11 MED ORDER — RIVAROXABAN 15 MG PO TABS
15.0000 mg | ORAL_TABLET | Freq: Every day | ORAL | Status: DC
  Administered 2017-05-11 – 2017-05-12 (×2): 15 mg via ORAL
  Filled 2017-05-11 (×2): qty 1

## 2017-05-11 MED ORDER — TORSEMIDE 20 MG PO TABS
80.0000 mg | ORAL_TABLET | Freq: Two times a day (BID) | ORAL | Status: DC
  Administered 2017-05-11 – 2017-05-12 (×4): 80 mg via ORAL
  Administered 2017-05-13: 40 mg via ORAL
  Filled 2017-05-11 (×5): qty 4

## 2017-05-11 NOTE — Progress Notes (Signed)
Dan with [facility] called for bariatric rollater for home; Aneta Mins [phone]

## 2017-05-11 NOTE — Progress Notes (Signed)
Patient had a small scab on his second left toe that somehow Opened and started bleeding this morning when NT was checking his weight.. Washed with warm water, applied pressure to stop bleeding, and wrapped toe/scab with gauze. Will continue to monitor.  Gabryel Talamo, RN

## 2017-05-11 NOTE — Progress Notes (Signed)
Progress Note  Patient Name: Terry Martinez. Date of Encounter: 05/11/2017  Primary Cardiologist: Dr. Dorris Carnes  Subjective   Breathing better, but weight is up 6 lbs in 2 days - still net negative recorded.   Inpatient Medications    Scheduled Meds: . allopurinol  100 mg Oral Daily  . aspirin EC  81 mg Oral Daily  . furosemide  80 mg Intravenous BID  . hydrALAZINE  12.5 mg Oral Q8H  . isosorbide mononitrate  15 mg Oral Daily  . metoprolol succinate  12.5 mg Oral Daily  . mometasone-formoterol  2 puff Inhalation BID  . potassium chloride  80 mEq Oral BID  . pravastatin  40 mg Oral q1800  . rivaroxaban  20 mg Oral Q supper   Continuous Infusions:  PRN Meds: acetaminophen, guaiFENesin-dextromethorphan, nitroGLYCERIN, ondansetron (ZOFRAN) IV, phenol, polyethylene glycol   Vital Signs    Vitals:   05/10/17 2022 05/10/17 2029 05/11/17 0442 05/11/17 0802  BP: (!) 148/54  123/64 (!) 144/63  Pulse: 77  71 70  Resp: 18  18 20   Temp: 98.4 F (36.9 C)  98 F (36.7 C) (!) 97.4 F (36.3 C)  TempSrc: Oral  Oral Oral  SpO2: 94% 94% 94% 91%  Weight:   (!) 300 lb 6.4 oz (136.3 kg)   Height:        Intake/Output Summary (Last 24 hours) at 05/11/2017 0838 Last data filed at 05/11/2017 0831 Gross per 24 hour  Intake 1000 ml  Output 1250 ml  Net -250 ml   Filed Weights   05/09/17 0416 05/10/17 0409 05/11/17 0442  Weight: 293 lb 1.6 oz (132.9 kg) 297 lb 3.2 oz (134.8 kg) (!) 300 lb 6.4 oz (136.3 kg)    Physical Exam   General: Well developed, well nourished, NAD Skin: Warm, dry, intact  Head: Normocephalic, atraumatic, clear, moist mucus membranes. Neck: Negative for carotid bruits. JVP elevated - ?related to TR Lungs:Clear to ausculation bilaterally. No wheezes, rales, or rhonchi. Breathing is unlabored. Cardiovascular: Irregularly irregular with S1 S2. No murmurs, rubs, or gallops Abdomen: Soft, non-tender, non-distend with normoactive bowel sounds.  No obvious  abdominal masses. MSK: Strength and tone appear normal for age. 5/5 in all extremities Extremities: trace to 1+ BLE edema. No clubbing or cyanosis. DP/PT pulses 1+ bilaterally Neuro: Alert and oriented. No focal deficits. No facial asymmetry. MAE spontaneously. Psych: Responds to questions appropriately with normal affect.    Labs    Chemistry Recent Labs  Lab 05/09/17 0448 05/10/17 0322 05/11/17 0531  NA 136 137 138  K 3.0* 3.9 4.9  CL 99* 99* 101  CO2 27 24 26   GLUCOSE 130* 145* 136*  BUN 56* 63* 73*  CREATININE 2.39* 2.56* 2.78*  CALCIUM 8.9 8.9 9.2  GFRNONAA 25* 23* 21*  GFRAA 29* 26* 24*  ANIONGAP 10 14 11      Hematology Recent Labs  Lab 05/10/17 0322  WBC 6.0  RBC 3.23*  HGB 9.9*  HCT 32.4*  MCV 100.3*  MCH 30.7  MCHC 30.6  RDW 18.3*  PLT 124*    Cardiac EnzymesNo results for input(s): TROPONINI in the last 168 hours. No results for input(s): TROPIPOC in the last 168 hours.   BNPNo results for input(s): BNP, PROBNP in the last 168 hours.   DDimer No results for input(s): DDIMER in the last 168 hours.   Radiology    No results found.  Telemetry    A-fib - Personally Reviewed  ECG  05/10/17 Atrial fibrillation with PVC HR 67- Personally Reviewed  Cardiac Studies   Echo 01/26/2017 LV EF: 35% - 40% Study Conclusions  - Left ventricle: The cavity size was moderately dilated. Systolic function was moderately reduced. The estimated ejection fraction was in the range of 35% to 40%. Wall motion was normal; there were no regional wall motion abnormalities. - Ventricular septum: The contour showed diastolic flattening and systolic flattening. - Aortic valve: Valve mobility was restricted. There was very mild stenosis. There was moderate regurgitation. - Aortic root: The aortic root was dilated measuring 49 mm. - Mitral valve: Calcified annulus. Mildly thickened leaflets . There was mild regurgitation. - Left atrium: The atrium  was severely dilated. - Right ventricle: The cavity size was severely dilated. Wall thickness was normal. Systolic function was severely reduced. - Right atrium: The atrium was severely dilated. - Tricuspid valve: There was moderate-severe regurgitation. - Pulmonic valve: There was moderate regurgitation. - Pulmonary arteries: Systolic pressure was moderately increased. PA peak pressure: 45 mm Hg (S). - Inferior vena cava: The vessel was dilated. The respirophasic diameter changes were blunted (<50%), consistent with elevated central venous pressure.  Impressions:  - When compared to the prior study from 05/26/2016 LVEF has decreased, now 35-40%. LV is moderately dilated. RV is severely dilated with severe systolic dysfunction. There is biatrial atrial dilatation. Aortic regurgitation is moderate, aortic root has further dilated at the sinus level measuring 49 mm, previously 47 mm. Moderate pulmonary hypertension.  Patient Profile     77 y.o. male with a PMH of chronic combine CHF (Last Echo 01/2017 with EF 35-45%), CAD s/p 6 PCI (last PCI 2014), PAD s/p aorta-bifemoral bypasswith multiple amputations, permanent atrial fibrillation,moderateAIby echo 01/2017,pulm HTN (VQ neg for PE per Dr. Ross/radiology review 01/2017), obesity,CKD stage III,chronic appearing anemia/thrombocytopenia who presented withDOE for several weeks. Admitted to cardiology for acute on chronic combined CHF, AKI on CKD, hypokalemia, pleural effusions in the setting of eating chicken and rice soup frequently. +20lb weight gain since last DC in 01/2017 (wt 285lb, admitted at 303lb). Has had recent increasing symptoms despite increasing home torsemide dosing.  Assessment & Plan    1. Acute on chronic combined systolic and diastolic heart failure: -Last echocardiogram 01/2017 with LVEF 35-45% -Torsemide 80 mg PO BID today, 05/11/17 -Weight, 300lb>>up from 297lb yesterday. Admission  weight 303lb. Weight at last office visit 02/28/2017, 281lb -I&O, net negative 530ml since yesterday, total output 1.15L (net -8.6L yesterday to net 9.2L today)  - Recheck weight today - if accurate, will add metolazone 2.5 mg to torsemide  2. 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 History of CAD with multiple PCI: -CAD s/p PCI x6 procedures, last PCI 2014 -Denies chest pain -Troponin, negative -EKG, nonacute -ASA, statin  4. Permanent atrial fibrillation: -Multiple PVC's? Will repeat EKG this AM -Atrial fibrillation per telemetry review, rate controlled -Continue Toprol-XL 12.5 mg -Continue Xarelto 20 mg -CHA2DS2VASc =5 (CHF, HTN, MI, age>75)  5. Hypokalemia: -K+,4.9 today>stable -Reduce to 40 MEQ BID  6. Acute on chronic COPD: -Duoneb added 05/09/17 for productive cough -Steroid taper, 60mg  x 3 days, 40mg  x 3 days, 20mg  x 3 days, then stop -Dulera added 05/10/17  Pixie Casino, MD, [facility], [place] Director of the [facility] Diplomate of  the [employer] of Clinical Lipidology Attending Cardiologist  Direct Dial: [phone]  Fax: [phone]  Website:  [email or web address]  05/11/2017, 8:38 AM    For questions or updates, please contact   Please consult [email or web address] for contact info under Cardiology/STEMI.

## 2017-05-11 NOTE — Progress Notes (Signed)
Physical Therapy Treatment Patient Details Name: Terry Martinez. MRN: [id number] DOB: April 25, 1940 Today's Date: 05/11/2017    History of Present Illness 77 y/o male admitted secondary to CHF exacerbation, acute renal insufficiency, permanent a-fib. Pt with significant PMH of PAF, PAD, HTN, gout, DVT, COPD, CKD, aortic insufficiency, anemia, aortobifemoral bypass, lumbar spine surgery, multiple LE bypass grafts, and multiple foot/toe amputations.    PT Comments    Pt performed gait training with mild resistance as he reports he was comfortable in his bed.  Pt appears anxious to return home and appears agitated with length of stay.  Pt remains mildly dyspnic but able to hold O2 saturations on RA at 90%.  Informed CM of need for bariatric rollator.  Plan to return home remains appropriate.    Follow Up Recommendations  Home health PT;Supervision/Assistance - 24 hour     Equipment Recommendations  Other (comment)(Rollator ( 4 wheeled RW with a seat) Pt will require a bariatric width.  Standard will not fit this patient.  )    Recommendations for Other Services       Precautions / Restrictions Precautions Precautions: Fall Restrictions Weight Bearing Restrictions: No    Mobility  Bed Mobility Overal bed mobility: Needs Assistance Bed Mobility: Supine to Sit     Supine to sit: Modified independent (Device/Increase time);HOB elevated     General bed mobility comments: Pt required increased time and use of mometum and railings but able to achieve sitting unassisted and appears safe with technique.    Transfers Overall transfer level: Needs assistance Equipment used: 4-wheeled walker(bariatric) Transfers: Sit to/from Stand Sit to Stand: Supervision;From elevated surface         General transfer comment: Cuse for locking rollator brakes to maintain safety during transfer activities.  Bed slightly elevated.    Ambulation/Gait Ambulation/Gait assistance:  Supervision Ambulation Distance (Feet): 340 Feet Assistive device: 4-wheeled walker(bariatric) Gait Pattern/deviations: Step-through pattern;Trunk flexed;Wide base of support   Gait velocity interpretation: at or above normal speed for age/gender General Gait Details: Cues for forward gaze.  Pt presents with increased cadence and increased WOB.  SPO2 90% on RA.     Stairs            Wheelchair Mobility    Modified Rankin (Stroke Patients Only)       Balance Overall balance assessment: Mild deficits observed, not formally tested                                          Cognition Arousal/Alertness: Awake/alert Behavior During Therapy: WFL for tasks assessed/performed Overall Cognitive Status: Within Functional Limits for tasks assessed                                        Exercises      General Comments        Pertinent Vitals/Pain Pain Assessment: 0-10 Pain Score: 3  Pain Location: L shoulder Pain Descriptors / Indicators: Aching Pain Intervention(s): Monitored during session;Repositioned    Home Living                      Prior Function            PT Goals (current goals can now be found in the care plan section) Acute Rehab PT Goals  Patient Stated Goal: to get home Potential to Achieve Goals: Good Progress towards PT goals: Progressing toward goals    Frequency    Min 3X/week      PT Plan Current plan remains appropriate    Co-evaluation              AM-PAC PT "6 Clicks" Daily Activity  Outcome Measure  Difficulty turning over in bed (including adjusting bedclothes, sheets and blankets)?: None Difficulty moving from lying on back to sitting on the side of the bed? : None Difficulty sitting down on and standing up from a chair with arms (e.g., wheelchair, bedside commode, etc,.)?: A Lot Help needed moving to and from a bed to chair (including a wheelchair)?: A Little Help needed walking  in hospital room?: A Little Help needed climbing 3-5 steps with a railing? : A Little 6 Click Score: 19    End of Session Equipment Utilized During Treatment: Gait belt Activity Tolerance: Patient tolerated treatment well Patient left: in chair;with call bell/phone within reach(Pt sitting on rollator seat post session.  ) Nurse Communication: Mobility status PT Visit Diagnosis: Unsteadiness on feet (R26.81);Other abnormalities of gait and mobility (R26.89);Muscle weakness (generalized) (M62.81);Difficulty in walking, not elsewhere classified (R26.2);Apraxia (R48.2);History of falling (Z91.81);Other symptoms and signs involving the nervous system (R29.898);BPPV;Dizziness and giddiness (R42);Adult, failure to thrive (R62.7) BPPV - Right/Left : Right     Time: 1027-2536 PT Time Calculation (min) (ACUTE ONLY): 13 min  Charges:  $Gait Training: 8-22 mins                    G Codes:       Governor Rooks, PTA pager [phone]    Cristela Blue 05/11/2017, 11:30 AM

## 2017-05-11 NOTE — Patient Outreach (Signed)
[facility]) Care Management  05/11/2017  Terry Martinez. Apr 26, 1940 [id number]  [facility] CSW was able to make initial contact with patient.  CSW obtained HIPAA compliant identifiers from patient/family, which included patient's name, address and date of birth. CSW introduced self, explained role and reason for the call; dental needs. Per patient, he has dentures that are 77 years old that he cannot use anymore due to the condition.  CSW offered to seek resources that may be of assist and follow up next week. Per pt, he is in the hospital being treated for "fluid". He also indicates he has Medicare and full Medicaid.   CSW plans f/u call next week.   Eduard Clos, MSW, [place] Worker  [place] [phone]

## 2017-05-11 NOTE — Progress Notes (Signed)
RN rounded on pt. Pt states he does not need anything at this time. 

## 2017-05-11 NOTE — Progress Notes (Signed)
Pt was mumbling and confused at 1400. Pt VS normal. Pt able to answer orientation questions now. MD notified. Pt is now singing in his room.

## 2017-05-11 NOTE — Care Management Important Message (Signed)
Important Message  Patient Details  Name: Terry Martinez. MRN: [id number] Date of Birth: [birth year]   Medicare Important Message Given:  Yes    Barb Merino Arvine Clayburn 05/11/2017, 2:45 PM

## 2017-05-12 DIAGNOSIS — I5043 Acute on chronic combined systolic (congestive) and diastolic (congestive) heart failure: Secondary | ICD-10-CM

## 2017-05-12 LAB — BASIC METABOLIC PANEL
Anion gap: 12 (ref 5–15)
BUN: 85 mg/dL — ABNORMAL HIGH (ref 6–20)
CHLORIDE: 96 mmol/L — AB (ref 101–111)
CO2: 25 mmol/L (ref 22–32)
CREATININE: 2.69 mg/dL — AB (ref 0.61–1.24)
Calcium: 9 mg/dL (ref 8.9–10.3)
GFR calc Af Amer: 25 mL/min — ABNORMAL LOW (ref >60)
GFR calc non Af Amer: 21 mL/min — ABNORMAL LOW (ref >60)
GLUCOSE: 127 mg/dL — AB (ref 65–99)
POTASSIUM: 4.6 mmol/L (ref 3.5–5.1)
SODIUM: 133 mmol/L — AB (ref 135–145)

## 2017-05-12 MED ORDER — METOLAZONE 2.5 MG PO TABS
2.5000 mg | ORAL_TABLET | Freq: Every day | ORAL | Status: DC
  Administered 2017-05-12 – 2017-05-13 (×2): 2.5 mg via ORAL
  Filled 2017-05-12 (×2): qty 1

## 2017-05-12 NOTE — Progress Notes (Signed)
Progress Note  Patient Name: Terry Martinez. Date of Encounter: 05/12/2017  Primary Cardiologist: Dr. Dorris Carnes  Subjective   Breathing better, he would like to go home, we will consider for tomorrow, he is still wheezing and the weight is persistent at 300 lbs.  Inpatient Medications    Scheduled Meds: . allopurinol  100 mg Oral Daily  . aspirin EC  81 mg Oral Daily  . hydrALAZINE  12.5 mg Oral Q8H  . isosorbide mononitrate  15 mg Oral Daily  . metoprolol succinate  12.5 mg Oral Daily  . mometasone-formoterol  2 puff Inhalation BID  . potassium chloride  80 mEq Oral BID  . pravastatin  40 mg Oral q1800  . rivaroxaban  15 mg Oral Q supper  . torsemide  80 mg Oral BID   Continuous Infusions:  PRN Meds: acetaminophen, guaiFENesin-dextromethorphan, nitroGLYCERIN, ondansetron (ZOFRAN) IV, phenol, polyethylene glycol   Vital Signs    Vitals:   05/11/17 2021 05/12/17 0601 05/12/17 0902 05/12/17 0924  BP: 138/61 (!) 141/79 126/64   Pulse: 65 92 74   Resp: 19 20    Temp: 98.2 F (36.8 C) (!) 97.5 F (36.4 C)    TempSrc: Oral Oral    SpO2: 92% 92%  92%  Weight:  (!) 300 lb 4.3 oz (136.2 kg)    Height:        Intake/Output Summary (Last 24 hours) at 05/12/2017 0934 Last data filed at 05/12/2017 0800 Gross per 24 hour  Intake 1320 ml  Output 2475 ml  Net -1155 ml   Filed Weights   05/11/17 0442 05/11/17 1030 05/12/17 0601  Weight: (!) 300 lb 6.4 oz (136.3 kg) (!) 301 lb 6.4 oz (136.7 kg) (!) 300 lb 4.3 oz (136.2 kg)    Physical Exam   General: Well developed, well nourished, NAD Skin: Warm, dry, intact  Head: Normocephalic, atraumatic, clear, moist mucus membranes. Neck: Negative for carotid bruits. JVP elevated - ?related to TR Lungs:Clear to ausculation bilaterally. No wheezes, rales, or rhonchi. Breathing is unlabored. Cardiovascular: Irregularly irregular with S1 S2. No murmurs, rubs, or gallops Abdomen: Soft, non-tender, non-distend with normoactive  bowel sounds.  No obvious abdominal masses. MSK: Strength and tone appear normal for age. 5/5 in all extremities Extremities: trace to 1+ BLE edema. No clubbing or cyanosis. DP/PT pulses 1+ bilaterally Neuro: Alert and oriented. No focal deficits. No facial asymmetry. MAE spontaneously. Psych: Responds to questions appropriately with normal affect.    Labs    Chemistry Recent Labs  Lab 05/10/17 0322 05/11/17 0531 05/12/17 0323  NA 137 138 133*  K 3.9 4.9 4.6  CL 99* 101 96*  CO2 24 26 25   GLUCOSE 145* 136* 127*  BUN 63* 73* 85*  CREATININE 2.56* 2.78* 2.69*  CALCIUM 8.9 9.2 9.0  GFRNONAA 23* 21* 21*  GFRAA 26* 24* 25*  ANIONGAP 14 11 12      Hematology Recent Labs  Lab 05/10/17 0322  WBC 6.0  RBC 3.23*  HGB 9.9*  HCT 32.4*  MCV 100.3*  MCH 30.7  MCHC 30.6  RDW 18.3*  PLT 124*    Cardiac EnzymesNo results for input(s): TROPONINI in the last 168 hours. No results for input(s): TROPIPOC in the last 168 hours.   BNPNo results for input(s): BNP, PROBNP in the last 168 hours.   DDimer No results for input(s): DDIMER in the last 168 hours.   Radiology    No results found.  Telemetry  A-fib - Personally Reviewed  ECG    05/10/17 Atrial fibrillation with PVC HR 67- Personally Reviewed  Cardiac Studies   Echo 01/26/2017 LV EF: 35% - 40% Study Conclusions  - Left ventricle: The cavity size was moderately dilated. Systolic function was moderately reduced. The estimated ejection fraction was in the range of 35% to 40%. Wall motion was normal; there were no regional wall motion abnormalities. - Ventricular septum: The contour showed diastolic flattening and systolic flattening. - Aortic valve: Valve mobility was restricted. There was very mild stenosis. There was moderate regurgitation. - Aortic root: The aortic root was dilated measuring 49 mm. - Mitral valve: Calcified annulus. Mildly thickened leaflets . There was mild regurgitation. -  Left atrium: The atrium was severely dilated. - Right ventricle: The cavity size was severely dilated. Wall thickness was normal. Systolic function was severely reduced. - Right atrium: The atrium was severely dilated. - Tricuspid valve: There was moderate-severe regurgitation. - Pulmonic valve: There was moderate regurgitation. - Pulmonary arteries: Systolic pressure was moderately increased. PA peak pressure: 45 mm Hg (S). - Inferior vena cava: The vessel was dilated. The respirophasic diameter changes were blunted (<50%), consistent with elevated central venous pressure.  Impressions:  - When compared to the prior study from 05/26/2016 LVEF has decreased, now 35-40%. LV is moderately dilated. RV is severely dilated with severe systolic dysfunction. There is biatrial atrial dilatation. Aortic regurgitation is moderate, aortic root has further dilated at the sinus level measuring 49 mm, previously 47 mm. Moderate pulmonary hypertension.  Patient Profile     77 y.o. male with a PMH of chronic combine CHF (Last Echo 01/2017 with EF 35-45%), CAD s/p 6 PCI (last PCI 2014), PAD s/p aorta-bifemoral bypasswith multiple amputations, permanent atrial fibrillation,moderateAIby echo 01/2017,pulm HTN (VQ neg for PE per Dr. Ross/radiology review 01/2017), obesity,CKD stage III,chronic appearing anemia/thrombocytopenia who presented withDOE for several weeks. Admitted to cardiology for acute on chronic combined CHF, AKI on CKD, hypokalemia, pleural effusions in the setting of eating chicken and rice soup frequently. +20lb weight gain since last DC in 01/2017 (wt 285lb, admitted at 303lb). Has had recent increasing symptoms despite increasing home torsemide dosing.  Assessment & Plan    1. Acute on chronic combined systolic and diastolic heart failure: -Last echocardiogram 01/2017 with LVEF 35-45% -Torsemide 80 mg PO BID today, 05/11/17 -Weight, 300lb despite high dose  of torsemide and worsening Crea/BUN, I will add metolazone 2.5 mg po x 1 today. Admission weight 303lb. Weight at last office visit 02/28/2017, 281lb -I&O, net negative 1.1 L since yesterday  2. Acute on chronic renal failure stage II: -Creatinine 2.69 today, up from 2.78 yesterday  3. History of CAD with multiple PCI: -CAD s/p PCI x6 procedures, last PCI 2014 -Denies chest pain -Troponin, negative -EKG, nonacute -ASA, statin  4. Permanent atrial fibrillation: -Multiple PVC's? Will repeat EKG this AM -Atrial fibrillation per telemetry review, rate controlled -Continue Toprol-XL 12.5 mg -Continue Xarelto 20 mg -CHA2DS2VASc =5 (CHF, HTN, MI, age>75)  5. Hypokalemia: -K+,4.9 today>stable -Reduce to 40 MEQ BID  6. Acute on chronic COPD: - still wheezing -Duoneb added 05/09/17 for productive cough -Steroid taper, 60mg  x 3 days, 40mg  x 3 days, 20mg  x 3 days, then stop -Dulera added 05/10/17  Anticipated discharge tomorrow.  Ena Dawley, MD 05/12/2017, 9:34 AM   For questions or updates, please contact   Please consult [email or web address] for contact info under Cardiology/STEMI.

## 2017-05-13 ENCOUNTER — Other Ambulatory Visit: Payer: Self-pay | Admitting: Physician Assistant

## 2017-05-13 DIAGNOSIS — N183 Chronic kidney disease, stage 3 unspecified: Secondary | ICD-10-CM

## 2017-05-13 MED ORDER — POTASSIUM CHLORIDE CRYS ER 20 MEQ PO TBCR: 20.0000 meq | EXTENDED_RELEASE_TABLET | Freq: Two times a day (BID) | ORAL | 3 refills | Status: DC

## 2017-05-13 MED ORDER — HYDRALAZINE HCL 25 MG PO TABS: 12.5000 mg | ORAL_TABLET | Freq: Three times a day (TID) | ORAL | 3 refills | Status: AC

## 2017-05-13 MED ORDER — TORSEMIDE 20 MG PO TABS: 40.0000 mg | ORAL_TABLET | Freq: Two times a day (BID) | ORAL | Status: DC

## 2017-05-13 MED ORDER — ISOSORBIDE MONONITRATE ER 30 MG PO TB24: 15.0000 mg | ORAL_TABLET | Freq: Every day | ORAL | 3 refills | Status: DC

## 2017-05-13 MED ORDER — TORSEMIDE 20 MG PO TABS: 40.0000 mg | ORAL_TABLET | Freq: Two times a day (BID) | ORAL | 2 refills | Status: DC

## 2017-05-13 MED ORDER — POTASSIUM CHLORIDE CRYS ER 20 MEQ PO TBCR
20.0000 meq | EXTENDED_RELEASE_TABLET | Freq: Two times a day (BID) | ORAL | Status: DC
  Administered 2017-05-13: 20 meq via ORAL

## 2017-05-13 MED ORDER — METOPROLOL SUCCINATE ER 25 MG PO TB24: 12.5000 mg | ORAL_TABLET | Freq: Every day | ORAL | 3 refills | Status: AC

## 2017-05-13 MED ORDER — IPRATROPIUM-ALBUTEROL 0.5-2.5 (3) MG/3ML IN SOLN: 3.0000 mL | Freq: Four times a day (QID) | RESPIRATORY_TRACT | 0 refills | Status: DC | PRN

## 2017-05-13 MED ORDER — PREDNISONE 20 MG PO TABS: 40.0000 mg | ORAL_TABLET | Freq: Every day | ORAL | 0 refills | Status: AC

## 2017-05-13 MED ORDER — MOMETASONE FURO-FORMOTEROL FUM 100-5 MCG/ACT IN AERO: 2.0000 | INHALATION_SPRAY | Freq: Two times a day (BID) | RESPIRATORY_TRACT | 0 refills | Status: DC

## 2017-05-13 NOTE — Discharge Summary (Signed)
Discharge Summary    Patient ID: Terry Martinez.,  MRN: [id number], DOB/AGE: 77-06-1940 77 y.o.  Admit date: 05/02/2017 Discharge date: 05/13/2017  Primary Care Provider: Lovenia Kim Primary Cardiologist: Dr. Harrington Challenger  Discharge Diagnoses    Active Problems:   Acute on chronic congestive heart failure (HCC)   Acute on chronic systolic CHF (congestive heart failure) (HCC)   Chronic renal insufficiency, stage IV (severe) (HCC)   Shortness of breath   COPD exacerbation (HCC)   Permanent atrial fibrillation   Hypokalemia   CAD without angina  Allergies Allergies  Allergen Reactions  . Zocor [Simvastatin] Hives and Rash    Diagnostic Studies/Procedures    None this admission    History of Present Illness     Terry Martinez. is a 77 y.o. male with a PMH of chronic combine CHF (Last Echo 01/2017 with EF 35-45%), CAD s/p 6 PCI (last PCI 2014), PAD s/p aorta-bifemoral bypass, permanent atrial fibrillation, AI, CKD stage III, who is presenting with worsening DOE for several weeks. Admitted to cardiology for acute on chronic combined CHF.  Terry Martinez was in his usual state of health until a few weeks ago when he began experiencing DOE. Progressively worsened to the point that he was experiencing SOB with minimal activity. Also with orthopnea and significant LE edema progressing up to this thighs. He increased his torsemide to twice daily over the past week with no improvement in symptoms. He notes weight gain of >20lbs since last discharged from the hospital in December. States he tries to adhere to a low salt diet but has been eating chicken and rice soup frequently over the last several days. He denies any chest pain, dizziness, lightheadedness, or syncope. ROS notable for L shoulder pain which is new and occurred upon waking one morning associated with limited mobility. Also with decreased urine output today.   He was last evaluated outpatient by cardiology 02/2017 as a follow-up  to his 01/2017 hospitalization for acute on chronic combined CHF. He was thought to be stable from a cardiac standpoint - had improvement in breathing, continued weight loss, and improved LE edema. He was continued on torsemide 20mg  daily.  ED course: afebrile, satting well on RA, bradycardic in the 50s, BP stable. Labs notable for K 3.3, Cr 2.51 (baseline 1.7), Hgb 11.0, PLT 136, BNP 723, Trop 0.01. EKG with atrial fibrillation, rate 54, PVCs, non-specific T wave abnormalities in inferior and anterolateral leads, no STE/D. CXR with small b/l pleural effusions and mild interstitial edema. Patient was given 60mg  IV lasix and admitted to cardiology for further management of acute on chronic combine CHF.    Hospital Course     Consultants: None  1. Acute on chronic combined systolic and diastolic heart failure: - in the setting of eating chicken and rice soup frequently. +20lb weight gain since last DC in 01/2017 (wt 285lb, admitted at 303lb). Has had recent increasing symptoms despite increasing home torsemide dosing. - He wad admitted and diuresed 12.7L. However weight only down 4 lb (303-->299lb). Possibly inaccurate weight, -  Torsemide 40 mg po BID, Toprol XL 12.5mg  qd, hydralazine 12.5mg  TID and Imdur 15mg  QD. No ACE/ARB due to CKD.    2. Acute on chronic renal failure stage III: -Creatinine stable 2.69. Repeat BMET later next week.   3. History of CAD with multiple PCI: -CAD s/p PCI x6 procedures, last PCI 2014 -Denies chest pain -Troponin, negative -EKG, nonacute - continue statin. No ASA due to need  to anticoagulation   4. Permanent atrial fibrillation: -Multiple PVC's? Atrial fibrillation per telemetry review, rate controlled -Continue Toprol-XL 12.5 mg -Continue Xarelto 20 mg -CHA2DS2VASc =5 (CHF, HTN, MI, age>75)  5. Hypokalemia: -resolved with supplimentations  6. Acute on chronic COPD: - still wheezing -Duoneb added 05/09/17 for productive cough -Steroid  taper, 60mg  x 2 days here and  40mg  x 3 days at discharge.  Ruthe Mannan added 05/10/17 - Follow up with PCP early next week  The patient has been seen by Dr. Meda Coffee today and deemed ready for discharge home. All follow-up appointments have been scheduled. Discharge medications are listed below.    Discharge Vitals Blood pressure (!) 171/77, pulse 61, temperature 97.7 F (36.5 C), temperature source Oral, resp. rate 18, height 6\' 3"  (1.905 m), weight 299 lb 4.8 oz (135.8 kg), SpO2 94 %.  Filed Weights   05/11/17 1030 05/12/17 0601 05/13/17 0337  Weight: (!) 301 lb 6.4 oz (136.7 kg) (!) 300 lb 4.3 oz (136.2 kg) 299 lb 4.8 oz (135.8 kg)    Labs & Radiologic Studies     CBC No results for input(s): WBC, NEUTROABS, HGB, HCT, MCV, PLT in the last 72 hours. Basic Metabolic Panel Recent Labs    05/11/17 0531 05/12/17 0323  NA 138 133*  K 4.9 4.6  CL 101 96*  CO2 26 25  GLUCOSE 136* 127*  BUN 73* 85*  CREATININE 2.78* 2.69*  CALCIUM 9.2 9.0     Dg Chest 2 View  Result Date: 05/02/2017 CLINICAL DATA:  Shortness of breath and leg swelling for the past 2 weeks. EXAM: CHEST - 2 VIEW COMPARISON:  Chest x-ray dated January 27, 2017. FINDINGS: Stable cardiomegaly. Mild interstitial prominence. New small bilateral pleural effusions. No consolidation or pneumothorax. No acute osseous abnormality. IMPRESSION: New small bilateral pleural effusions and mild interstitial edema. Electronically Signed   By: Titus Dubin M.D.   On: 05/02/2017 16:25   US Renal  Result Date: 05/04/2017 CLINICAL DATA:  Chronic renal insufficiency, stage IV. EXAM: RENAL / URINARY TRACT ULTRASOUND COMPLETE COMPARISON:  Ultrasound of October 26, 2016. FINDINGS: Right Kidney: Length: 14.3 cm. Echogenicity within normal limits. No mass or hydronephrosis visualized. Left Kidney: Length: 13.2 cm. Two simple left renal cysts are noted, the largest measuring 2.6 cm. Echogenicity within normal limits. No mass or hydronephrosis  visualized. Bladder: Appears normal for degree of bladder distention. Bilateral ureteral jets are noted. Minimal free fluid is seen in the right upper quadrant. IMPRESSION: No significant renal abnormality is noted. Minimal free fluid is noted in the right upper quadrant of unknown etiology. Electronically Signed   By: Marijo Conception, M.D.   On: 05/04/2017 16:46   Dg Chest Port 1 View  Result Date: 05/08/2017 CLINICAL DATA:  Shortness of breath, cough EXAM: PORTABLE CHEST 1 VIEW COMPARISON:  05/02/2017 FINDINGS: There is mild bilateral interstitial prominence. There is no focal parenchymal opacity. There is no pleural effusion or pneumothorax. There is stable cardiomegaly. There is thoracic aortic atherosclerosis. The osseous structures are unremarkable. IMPRESSION: Cardiomegaly with mild pulmonary vascular congestion. Electronically Signed   By: Kathreen Devoid   On: 05/08/2017 10:21   Dg Shoulder Left  Result Date: 05/02/2017 CLINICAL DATA:  Generalized left shoulder pain x2 weeks EXAM: LEFT SHOULDER - 2+ VIEW COMPARISON:  01/27/2017 CXR FINDINGS: Mild degenerative joint space narrowing and spurring about the [facility] and glenohumeral joints. No joint dislocation, fracture or suspicious osseous lesions. IMPRESSION: Mild osteoarthritic joint space narrowing and spurring of the  left AC and glenohumeral joints. Electronically Signed   By: Ashley Royalty M.D.   On: 05/02/2017 20:49    Disposition   Pt is being discharged home today in good condition.  Follow-up [facility].   Why:  They will do your home health care at your home Contact information: [address] [place] [address] [phone]        Fay Records, MD. Go on 05/24/2017.   Specialty:  Cardiology Why:  @10 :20 am for hospital follow up Contact information: [place] [address] [phone]        [place]. Go on 05/17/2017.   Specialty:  Cardiology Why:  for BMET check between 8am to 5 pm Contact information: [address], [address] [place]       Lovenia Kim, MD. Schedule an appointment as soon as possible for a visit in 3 day(s).   Specialty:  Family Medicine Why:  for COPD exacerbation. Your BMET can be check at that time instead of Dr. Alan Ripper office Contact information: [place] [address] [phone]          Discharge Instructions    AMB Referral to [facility]   Complete by:  As directed    Please assign patient to social worker for post hospital follow up community resource for dentures for low cost options.  Patient also has Medicaid. He has moved in with his sister and brother in law.  Please assign to community nurse for HF program and assess for home visits. Patient's primary care provider does the transition of care . Questions please call:   Natividad Brood, RN BSN [facility] Liaison  [phone] business mobile phone Toll free office [phone]   Reason for consult:  HF follow up program   Diagnoses of:  Heart Failure   Expected date of contact:  1-3 days (reserved for hospital discharges)   Diet - low sodium heart healthy   Complete by:  As directed    Discharge instructions   Complete by:  As directed    *Weigh yourself on the same scale at same time of day and keep a log. *Report weight gain of > 3 lbs in 1 day or 5 lbs over the course of a week and/or symptoms of excess fluid (shortness of breath, difficulty lying flat, swelling, poor appetite, abdominal fullness/bloating, etc) to your doctor immediately. *Avoid foods that are high in sodium (processed, pre-packaged/canned goods, fast foods, etc). *Please attend all scheduled and reccommended follow up appointments   Increase activity slowly   Complete by:  As directed       Discharge  Medications   Allergies as of 05/13/2017      Reactions   Zocor [simvastatin] Hives, Rash      Medication List    STOP taking these medications   amLODipine 5 MG tablet Commonly known as:  NORVASC   doxycycline 100 MG tablet Commonly known as:  VIBRA-TABS   enalapril 10 MG tablet Commonly known as:  VASOTEC   EPINEPHrine 0.3 mg/0.3 mL Soaj injection Commonly known as:  EPI-PEN   metoprolol tartrate 25 MG tablet Commonly known as:  LOPRESSOR     TAKE these medications   acetaminophen 500 MG tablet Commonly known as:  TYLENOL Take 1,000 mg by  mouth every 8 (eight) hours as needed for moderate pain or headache.   albuterol 108 (90 Base) MCG/ACT inhaler Commonly known as:  PROVENTIL HFA;VENTOLIN HFA Inhale 2 puffs into the lungs every 6 (six) hours as needed for wheezing or shortness of breath.   allopurinol 100 MG tablet Commonly known as:  ZYLOPRIM TAKE ONE TABLET BY MOUTH ONCE DAILY IN THE EVENING What changed:    how much to take  how to take this  when to take this  additional instructions  Another medication with the same name was removed. Continue taking this medication, and follow the directions you see here.   hydrALAZINE 25 MG tablet Commonly known as:  APRESOLINE Take 0.5 tablets (12.5 mg total) by mouth every 8 (eight) hours.   ipratropium-albuterol 0.5-2.5 (3) MG/3ML Soln Commonly known as:  DUONEB Take 3 mLs by nebulization every 6 (six) hours as needed (wheezing).   isosorbide mononitrate 30 MG 24 hr tablet Commonly known as:  IMDUR Take 0.5 tablets (15 mg total) by mouth daily. Start taking on:  05/14/2017   LUBRICATING EYE DROPS OP Place 1 drop into both eyes 4 (four) times daily as needed (dry eyes).   metoprolol succinate 25 MG 24 hr tablet Commonly known as:  TOPROL-XL Take 0.5 tablets (12.5 mg total) by mouth daily. Start taking on:  05/14/2017   metroNIDAZOLE 1 % gel Commonly known as:  METROGEL Apply 1 application topically  daily as needed (SKIN IRRITATION/ROSACEA.).   mometasone-formoterol 100-5 MCG/ACT Aero Commonly known as:  DULERA Inhale 2 puffs into the lungs 2 (two) times daily.   nitroGLYCERIN 0.4 MG SL tablet Commonly known as:  NITROSTAT Place 1 tablet (0.4 mg total) under the tongue every 5 (five) minutes as needed for chest pain.   polyethylene glycol powder powder Commonly known as:  GLYCOLAX/MIRALAX DISSOLVE 17G (1 CAPFUL) OF POWDER IN 8 OUNCES OF LIQUID AND DRINK 2 TIMES PER DAY AS NEEDED FOR MILD CONSTIPATION   potassium chloride SA 20 MEQ tablet Commonly known as:  K-DUR,KLOR-CON Take 1 tablet (20 mEq total) by mouth 2 (two) times daily.   pravastatin 40 MG tablet Commonly known as:  PRAVACHOL Take 1 tablet (40 mg total) by mouth daily at 6 PM.   predniSONE 20 MG tablet Commonly known as:  DELTASONE Take 2 tablets (40 mg total) by mouth daily with breakfast for 3 days.   rivaroxaban 20 MG Tabs tablet Commonly known as:  XARELTO Take 1 tablet (20 mg total) by mouth daily with supper.   torsemide 20 MG tablet Commonly known as:  DEMADEX Take 2 tablets (40 mg total) by mouth 2 (two) times daily. What changed:    how much to take  when to take this  Another medication with the same name was removed. Continue taking this medication, and follow the directions you see here.   vitamin C 500 MG tablet Commonly known as:  ASCORBIC ACID Take 500 mg by mouth daily.   VITAMIN D-3 PO Take 1 tablet by mouth every morning.            Durable Medical Equipment  (From admission, onward)        Start     Ordered   05/11/17 1118  For home use only DME Walker rolling  Once    Comments:  Bariatric rollater  Question:  Patient needs a walker to treat with the following condition  Answer:  CHF (congestive heart failure) ([place])   05/11/17 1118  Outstanding Labs/Studies   BMET next week or during follow up PCP visit.  Duration of Discharge Encounter   Greater than 30  minutes including physician time.  Signed, Crista Luria Wyat Infinger PA-C 05/13/2017, 10:34 AM

## 2017-05-13 NOTE — Progress Notes (Signed)
Progress Note  Patient Name: Terry Martinez. Date of Encounter: 05/13/2017  Primary Cardiologist: Dr. Dorris Carnes  Subjective   Breathing better, he insists on going home, diuresed 2.5 L overnight and feels better.  Inpatient Medications    Scheduled Meds: . allopurinol  100 mg Oral Daily  . aspirin EC  81 mg Oral Daily  . hydrALAZINE  12.5 mg Oral Q8H  . isosorbide mononitrate  15 mg Oral Daily  . metolazone  2.5 mg Oral Daily  . metoprolol succinate  12.5 mg Oral Daily  . mometasone-formoterol  2 puff Inhalation BID  . potassium chloride  80 mEq Oral BID  . pravastatin  40 mg Oral q1800  . rivaroxaban  15 mg Oral Q supper  . torsemide  80 mg Oral BID   Continuous Infusions:  PRN Meds: acetaminophen, guaiFENesin-dextromethorphan, nitroGLYCERIN, ondansetron (ZOFRAN) IV, phenol, polyethylene glycol   Vital Signs    Vitals:   05/12/17 2050 05/12/17 2304 05/13/17 0337 05/13/17 0700  BP:  (!) 141/81 122/73 (!) 139/58  Pulse:  91 70 (!) 58  Resp:   18   Temp:   97.7 F (36.5 C)   TempSrc:   Oral   SpO2: 94%  94%   Weight:   299 lb 4.8 oz (135.8 kg)   Height:        Intake/Output Summary (Last 24 hours) at 05/13/2017 0930 Last data filed at 05/13/2017 1601 Gross per 24 hour  Intake 1020 ml  Output 3300 ml  Net -2280 ml   Filed Weights   05/11/17 1030 05/12/17 0601 05/13/17 0337  Weight: (!) 301 lb 6.4 oz (136.7 kg) (!) 300 lb 4.3 oz (136.2 kg) 299 lb 4.8 oz (135.8 kg)    Physical Exam   General: Well developed, well nourished, NAD Skin: Warm, dry, intact  Head: Normocephalic, atraumatic, clear, moist mucus membranes. Neck: Negative for carotid bruits. JVP elevated - ?related to TR Lungs:Clear to ausculation bilaterally. No wheezes, rales, or rhonchi. Breathing is unlabored. Cardiovascular: Irregularly irregular with S1 S2. No murmurs, rubs, or gallops Abdomen: Soft, non-tender, non-distend with normoactive bowel sounds.  No obvious abdominal  masses. MSK: Strength and tone appear normal for age. 5/5 in all extremities Extremities: trace to 1+ BLE edema. No clubbing or cyanosis. DP/PT pulses 1+ bilaterally Neuro: Alert and oriented. No focal deficits. No facial asymmetry. MAE spontaneously. Psych: Responds to questions appropriately with normal affect.    Labs    Chemistry Recent Labs  Lab 05/10/17 0322 05/11/17 0531 05/12/17 0323  NA 137 138 133*  K 3.9 4.9 4.6  CL 99* 101 96*  CO2 24 26 25   GLUCOSE 145* 136* 127*  BUN 63* 73* 85*  CREATININE 2.56* 2.78* 2.69*  CALCIUM 8.9 9.2 9.0  GFRNONAA 23* 21* 21*  GFRAA 26* 24* 25*  ANIONGAP 14 11 12      Hematology Recent Labs  Lab 05/10/17 0322  WBC 6.0  RBC 3.23*  HGB 9.9*  HCT 32.4*  MCV 100.3*  MCH 30.7  MCHC 30.6  RDW 18.3*  PLT 124*    Cardiac EnzymesNo results for input(s): TROPONINI in the last 168 hours. No results for input(s): TROPIPOC in the last 168 hours.   BNPNo results for input(s): BNP, PROBNP in the last 168 hours.   DDimer No results for input(s): DDIMER in the last 168 hours.   Radiology    No results found.  Telemetry    A-fib - Personally Reviewed  ECG  05/10/17 Atrial fibrillation with PVC HR 67- Personally Reviewed  Cardiac Studies   Echo 01/26/2017 LV EF: 35% - 40% Study Conclusions  - Left ventricle: The cavity size was moderately dilated. Systolic function was moderately reduced. The estimated ejection fraction was in the range of 35% to 40%. Wall motion was normal; there were no regional wall motion abnormalities. - Ventricular septum: The contour showed diastolic flattening and systolic flattening. - Aortic valve: Valve mobility was restricted. There was very mild stenosis. There was moderate regurgitation. - Aortic root: The aortic root was dilated measuring 49 mm. - Mitral valve: Calcified annulus. Mildly thickened leaflets . There was mild regurgitation. - Left atrium: The atrium was severely  dilated. - Right ventricle: The cavity size was severely dilated. Wall thickness was normal. Systolic function was severely reduced. - Right atrium: The atrium was severely dilated. - Tricuspid valve: There was moderate-severe regurgitation. - Pulmonic valve: There was moderate regurgitation. - Pulmonary arteries: Systolic pressure was moderately increased. PA peak pressure: 45 mm Hg (S). - Inferior vena cava: The vessel was dilated. The respirophasic diameter changes were blunted (<50%), consistent with elevated central venous pressure.  Impressions:  - When compared to the prior study from 05/26/2016 LVEF has decreased, now 35-40%. LV is moderately dilated. RV is severely dilated with severe systolic dysfunction. There is biatrial atrial dilatation. Aortic regurgitation is moderate, aortic root has further dilated at the sinus level measuring 49 mm, previously 47 mm. Moderate pulmonary hypertension.  Patient Profile     77 y.o. male with a PMH of chronic combine CHF (Last Echo 01/2017 with EF 35-45%), CAD s/p 6 PCI (last PCI 2014), PAD s/p aorta-bifemoral bypasswith multiple amputations, permanent atrial fibrillation,moderateAIby echo 01/2017,pulm HTN (VQ neg for PE per Dr. Ross/radiology review 01/2017), obesity,CKD stage III,chronic appearing anemia/thrombocytopenia who presented withDOE for several weeks. Admitted to cardiology for acute on chronic combined CHF, AKI on CKD, hypokalemia, pleural effusions in the setting of eating chicken and rice soup frequently. +20lb weight gain since last DC in 01/2017 (wt 285lb, admitted at 303lb). Has had recent increasing symptoms despite increasing home torsemide dosing.  Assessment & Plan    1. Acute on chronic combined systolic and diastolic heart failure: -Last echocardiogram 01/2017 with LVEF 35-45% -Torsemide 80 mg PO BID today, 05/11/17 -Weight 2.99 despite negative 2.5 L overnight, possibly inaccurate  weight, give one more metolazone today, then discharge on Torsemide 40 mg po BID - arrange for a follow up in the clinic later this week with labs - BMP  2. Acute on chronic renal failure stage II: -Creatinine 2.69 yesterday, pending today  3. History of CAD with multiple PCI: -CAD s/p PCI x6 procedures, last PCI 2014 -Denies chest pain -Troponin, negative -EKG, nonacute -ASA, statin  4. Permanent atrial fibrillation: -Multiple PVC's? Will repeat EKG this AM -Atrial fibrillation per telemetry review, rate controlled -Continue Toprol-XL 12.5 mg -Continue Xarelto 20 mg -CHA2DS2VASc =5 (CHF, HTN, MI, age>75)  5. Hypokalemia: -K+,4.9 today>stable -Reduce to 20 MEQ BID  6. Acute on chronic COPD: - still wheezing -Duoneb added 05/09/17 for productive cough -Steroid taper, 60mg  x 3 days, 40mg  x 3 days, 20mg  x 3 days, then stop -Dulera added 05/10/17  Anticipated discharge tomorrow.  Ena Dawley, MD 05/13/2017, 9:30 AM   For questions or updates, please contact   Please consult [email or web address] for contact info under Cardiology/STEMI.

## 2017-05-14 ENCOUNTER — Telehealth: Payer: Self-pay | Admitting: Internal Medicine

## 2017-05-14 MED ORDER — IPRATROPIUM-ALBUTEROL 0.5-2.5 (3) MG/3ML IN SOLN: 3.0000 mL | Freq: Four times a day (QID) | RESPIRATORY_TRACT | 0 refills | Status: DC | PRN

## 2017-05-14 NOTE — Telephone Encounter (Signed)
Spoke to patient's sister, Terry Martinez. Pt was dc'd yesterday from hospital with COPD exacerbation. Pt was sent home on [facility].  Medicaid won't cover this.  Pharmacy recommended she call B. Bhagat for substitute.  I spoke with B. Bhagat who recommends pt contact PCP for any alternatives.  Informed pt's sister of this. Ms. Terry Martinez also could not pick up duonebs due to the pharmacy needed an actual diagnosis code on the eScript.  addded code (wheezing listed as reason) and reordered albuterol.  Will route to B. Bhagat to inform.

## 2017-05-14 NOTE — Telephone Encounter (Signed)
New Message    Pt c/o medication issue:  1. Name of Medication: Duoneb  2. How are you currently taking this medication (dosage and times per day)? Take 3 mLs by nebulization every 6 (six) hours as needed (wheezing). Dx: R06.2  3. Are you having a reaction (difficulty breathing--STAT)? nonr  4. What is your medication issue? Gaspar Bidding with [facility] states that Medicare will not approve rx for diagnosis of wheezing. Please call to discuss.

## 2017-05-14 NOTE — Telephone Encounter (Signed)
Reordered per Robbie Lis, COPD acute exacerbation.

## 2017-05-14 NOTE — Telephone Encounter (Signed)
New Message  Pt c/o medication issue:  1. Name of Medication: mometasone-formoterol (DULERA) 100-5 MCG/ACT AERO  2. How are you currently taking this medication (dosage and times per day)? Inhale 2 puffs into the lungs 2 (two) times daily  3. Are you having a reaction (difficulty breathing--STAT)? no  4. What is your medication issue? States that pt insurance will not cover meds, so would like to know what options they have. Please call

## 2017-05-15 ENCOUNTER — Other Ambulatory Visit: Payer: Self-pay

## 2017-05-15 ENCOUNTER — Telehealth: Payer: Self-pay | Admitting: Internal Medicine

## 2017-05-15 DIAGNOSIS — I5043 Acute on chronic combined systolic (congestive) and diastolic (congestive) heart failure: Secondary | ICD-10-CM | POA: Diagnosis not present

## 2017-05-15 DIAGNOSIS — Z7951 Long term (current) use of inhaled steroids: Secondary | ICD-10-CM | POA: Diagnosis not present

## 2017-05-15 DIAGNOSIS — N183 Chronic kidney disease, stage 3 (moderate): Secondary | ICD-10-CM | POA: Diagnosis not present

## 2017-05-15 DIAGNOSIS — Z7901 Long term (current) use of anticoagulants: Secondary | ICD-10-CM | POA: Diagnosis not present

## 2017-05-15 DIAGNOSIS — I4891 Unspecified atrial fibrillation: Secondary | ICD-10-CM | POA: Diagnosis not present

## 2017-05-15 DIAGNOSIS — I2510 Atherosclerotic heart disease of native coronary artery without angina pectoris: Secondary | ICD-10-CM | POA: Diagnosis not present

## 2017-05-15 DIAGNOSIS — D631 Anemia in chronic kidney disease: Secondary | ICD-10-CM | POA: Diagnosis not present

## 2017-05-15 DIAGNOSIS — M109 Gout, unspecified: Secondary | ICD-10-CM | POA: Diagnosis not present

## 2017-05-15 DIAGNOSIS — I130 Hypertensive heart and chronic kidney disease with heart failure and stage 1 through stage 4 chronic kidney disease, or unspecified chronic kidney disease: Secondary | ICD-10-CM | POA: Diagnosis not present

## 2017-05-15 DIAGNOSIS — J441 Chronic obstructive pulmonary disease with (acute) exacerbation: Secondary | ICD-10-CM | POA: Diagnosis not present

## 2017-05-15 DIAGNOSIS — K922 Gastrointestinal hemorrhage, unspecified: Secondary | ICD-10-CM | POA: Diagnosis not present

## 2017-05-15 DIAGNOSIS — E785 Hyperlipidemia, unspecified: Secondary | ICD-10-CM | POA: Diagnosis not present

## 2017-05-15 DIAGNOSIS — I739 Peripheral vascular disease, unspecified: Secondary | ICD-10-CM | POA: Diagnosis not present

## 2017-05-15 NOTE — Telephone Encounter (Signed)
New Message:    Dena from [facility] is calling due to the hospital discharging pt with a order for albuterol but did not place an order for the nebulizer. They would like for you to send a order over for this as soon as possible.

## 2017-05-15 NOTE — Patient Outreach (Signed)
Transition of care: Discharged on 05/13/2017  Vitals:   05/15/17 1534  Weight: 296 lb 1.6 oz (134.3 kg)    Placed call to patient who was asleep. Spoke with sister Garald Braver ( on consent form).  [name] reports that patient is very swollen today despite no weight gain. Reports [facility] came to see patient today. Reports no rolling walker and no nebulizer machine. Sister reports home health nurse was following up on this today and sister is awaiting a call back from [facility].   Sister reports that she has the duoneb medication but patient does not have machine. Sister reports dulera not cover by insurance so patient will see primary MD tomorrow. Sister reports that patient has his albuterol and dulera that he was sent home from the hospital with.  Sister reports patient will get labs tomorrow at Primary MD office.  Sister is concerned about swelling and patients shortness of breath.   Outpatient Encounter Medications as of 05/15/2017  Medication Sig Note  . acetaminophen (TYLENOL) 500 MG tablet Take 1,000 mg by mouth every 8 (eight) hours as needed for moderate pain or headache.    . albuterol (PROVENTIL HFA;VENTOLIN HFA) 108 (90 BASE) MCG/ACT inhaler Inhale 2 puffs into the lungs every 6 (six) hours as needed for wheezing or shortness of breath.   . allopurinol (ZYLOPRIM) 100 MG tablet TAKE ONE TABLET BY MOUTH ONCE DAILY IN THE EVENING (Patient taking differently: Take 100-300 mg by mouth See admin instructions. Take 100 mg in the morning and 300 mg in the evening)   . Carboxymethylcellul-Glycerin (LUBRICATING EYE DROPS OP) Place 1 drop into both eyes 4 (four) times daily as needed (dry eyes).    . Cholecalciferol (VITAMIN D-3 PO) Take 1 tablet by mouth every morning.    . hydrALAZINE (APRESOLINE) 25 MG tablet Take 0.5 tablets (12.5 mg total) by mouth every 8 (eight) hours.   . isosorbide mononitrate (IMDUR) 30 MG 24 hr tablet Take 0.5 tablets (15 mg total) by  mouth daily.   . metoprolol succinate (TOPROL-XL) 25 MG 24 hr tablet Take 0.5 tablets (12.5 mg total) by mouth daily.   . metroNIDAZOLE (METROGEL) 1 % gel Apply 1 application topically daily as needed (SKIN IRRITATION/ROSACEA.).    [name] mometasone-formoterol (DULERA) 100-5 MCG/ACT AERO Inhale 2 puffs into the lungs 2 (two) times daily. 05/15/2017: Has sample from hospital. Reports this medication not covered by insurance  . polyethylene glycol powder (GLYCOLAX/MIRALAX) powder DISSOLVE 17G (1 CAPFUL) OF POWDER IN 8 OUNCES OF LIQUID AND DRINK 2 TIMES PER DAY AS NEEDED FOR MILD CONSTIPATION   . potassium chloride SA (K-DUR,KLOR-CON) 20 MEQ tablet Take 1 tablet (20 mEq total) by mouth 2 (two) times daily.   . pravastatin (PRAVACHOL) 40 MG tablet Take 1 tablet (40 mg total) by mouth daily at 6 PM.   . predniSONE (DELTASONE) 20 MG tablet Take 2 tablets (40 mg total) by mouth daily with breakfast for 3 days. 05/15/2017: Has one dose left  . rivaroxaban (XARELTO) 20 MG TABS tablet Take 1 tablet (20 mg total) by mouth daily with supper.   . torsemide (DEMADEX) 20 MG tablet Take 2 tablets (40 mg total) by mouth 2 (two) times daily.   . vitamin C (ASCORBIC ACID) 500 MG tablet Take 500 mg by mouth daily.   [name] ipratropium-albuterol (DUONEB) 0.5-2.5 (3) MG/3ML SOLN Take 3 mLs by nebulization every 6 (six) hours as needed (wheezing). Dx:J44.1 (Patient not taking: Reported on 05/15/2017) 05/15/2017:  Was not discharged with a nebulizer  . nitroGLYCERIN (NITROSTAT) 0.4 MG SL tablet Place 1 tablet (0.4 mg total) under the tongue every 5 (five) minutes as needed for chest pain. (Patient not taking: Reported on 05/15/2017)    No facility-administered encounter medications on file as of 05/15/2017.      PLAN:  (1)reviewed all medications with sister who fills pill box.  Sound like patient has all medications but is missing his nebulizer machine.  (2)Active with home health who checking on equipment. (3)Follow up appointment  with primary MD tomorrow. (4) increased swelling but no weight gain.  (5) home visit planned for 05/21/2017  Confirmed address.   THN CM Care Plan Problem One     Most Recent Value  Care Plan Problem One  Recent admission for COPD and CHF  Role Documenting the Problem One  Care Management Coordinator  Care Plan for Problem One  Active  THN Long Term Goal   Patient will report no readmissions for heart failure or COPD in the next 91 days.   THN Long Term Goal Start Date  05/15/17  Interventions for Problem One Long Term Goal  Reviewed discharge instructions and medications. Confirmed home health nurse had arrived. Reviewed COPD zones and heart failure zones. Booked and initial home visit and provided my contact information.   THN CM Short Term Goal #1   Patient will verbalize arrival of nebulizer and rolling walker in the next 2 days.   THN CM Short Term Goal #1 Start Date  05/15/17  Interventions for Short Term Goal #1  Reviewed orders. Reviewed with sister her understanding of plan of care from home health nurse. Encouraged sister to call me if equipement does not arrive in 2 days.    THN CM Short Term Goal #2   Patient will verbalize weighing daily for the next 30 days.   THN CM Short Term Goal #2 Start Date  05/15/17  Interventions for Short Term Goal #2  Reviewed importance of daily weights with sister and when to call MD for weight gain. Encouraged the use of compression hose.      This note and barrier letter sent to MD.  Tomasa Rand, RN, BSN, CEN [place] Coordinator [phone]

## 2017-05-15 NOTE — Telephone Encounter (Signed)
Spoke with Dena from [place] who states that she needs an order faxed for a nebulizer machine.[name]

## 2017-05-15 NOTE — Telephone Encounter (Signed)
Left message informing her Tommi Rumps) that I would forward message to Dr Ross/nurse.Marland KitchenMarland Kitchen

## 2017-05-15 NOTE — Telephone Encounter (Signed)
New Message    [facility] with [place] care is calling in to see about getting some additional orders for nurse visits. Please call to discuss.

## 2017-05-15 NOTE — Telephone Encounter (Signed)
Left Tommi Rumps (nurse) message to call back.[name]

## 2017-05-15 NOTE — Telephone Encounter (Signed)
OK to get additional nursing visits and any other orders. Pt is due to see me on 05/24/17

## 2017-05-16 ENCOUNTER — Other Ambulatory Visit: Payer: Self-pay | Admitting: *Deleted

## 2017-05-16 ENCOUNTER — Ambulatory Visit (INDEPENDENT_AMBULATORY_CARE_PROVIDER_SITE_OTHER): Payer: Medicare Other | Admitting: Internal Medicine

## 2017-05-16 ENCOUNTER — Telehealth: Payer: Self-pay | Admitting: Internal Medicine

## 2017-05-16 ENCOUNTER — Telehealth: Payer: Self-pay

## 2017-05-16 VITALS — BP 125/80 | HR 84 | Temp 97.6°F | Wt 294.8 lb

## 2017-05-16 DIAGNOSIS — R8290 Unspecified abnormal findings in urine: Secondary | ICD-10-CM

## 2017-05-16 DIAGNOSIS — S51811A Laceration without foreign body of right forearm, initial encounter: Secondary | ICD-10-CM

## 2017-05-16 DIAGNOSIS — D631 Anemia in chronic kidney disease: Secondary | ICD-10-CM | POA: Diagnosis not present

## 2017-05-16 DIAGNOSIS — J441 Chronic obstructive pulmonary disease with (acute) exacerbation: Secondary | ICD-10-CM | POA: Diagnosis not present

## 2017-05-16 DIAGNOSIS — R079 Chest pain, unspecified: Secondary | ICD-10-CM

## 2017-05-16 DIAGNOSIS — Z09 Encounter for follow-up examination after completed treatment for conditions other than malignant neoplasm: Secondary | ICD-10-CM

## 2017-05-16 DIAGNOSIS — I130 Hypertensive heart and chronic kidney disease with heart failure and stage 1 through stage 4 chronic kidney disease, or unspecified chronic kidney disease: Secondary | ICD-10-CM | POA: Diagnosis not present

## 2017-05-16 DIAGNOSIS — N183 Chronic kidney disease, stage 3 unspecified: Secondary | ICD-10-CM

## 2017-05-16 DIAGNOSIS — R739 Hyperglycemia, unspecified: Secondary | ICD-10-CM | POA: Diagnosis not present

## 2017-05-16 DIAGNOSIS — I5023 Acute on chronic systolic (congestive) heart failure: Secondary | ICD-10-CM | POA: Diagnosis not present

## 2017-05-16 DIAGNOSIS — I2510 Atherosclerotic heart disease of native coronary artery without angina pectoris: Secondary | ICD-10-CM | POA: Diagnosis present

## 2017-05-16 DIAGNOSIS — I5043 Acute on chronic combined systolic (congestive) and diastolic (congestive) heart failure: Secondary | ICD-10-CM | POA: Diagnosis not present

## 2017-05-16 DIAGNOSIS — N179 Acute kidney failure, unspecified: Secondary | ICD-10-CM

## 2017-05-16 LAB — POCT URINALYSIS DIPSTICK
BILIRUBIN UA: NEGATIVE
Blood, UA: NEGATIVE
Glucose, UA: NEGATIVE
KETONES UA: NEGATIVE
LEUKOCYTES UA: NEGATIVE
Nitrite, UA: NEGATIVE
PH UA: 5.5 (ref 5.0–8.0)
PROTEIN UA: NEGATIVE
SPEC GRAV UA: 1.01 (ref 1.010–1.025)
Urobilinogen, UA: 0.2 E.U./dL

## 2017-05-16 MED ORDER — NITROGLYCERIN 0.4 MG SL SUBL: 0.4000 mg | SUBLINGUAL_TABLET | SUBLINGUAL | 0 refills | Status: AC | PRN

## 2017-05-16 MED ORDER — BUDESONIDE-FORMOTEROL FUMARATE 160-4.5 MCG/ACT IN AERO: 2.0000 | INHALATION_SPRAY | Freq: Two times a day (BID) | RESPIRATORY_TRACT | 12 refills | Status: DC

## 2017-05-16 MED ORDER — ALBUTEROL SULFATE HFA 108 (90 BASE) MCG/ACT IN AERS: 2.0000 | INHALATION_SPRAY | Freq: Four times a day (QID) | RESPIRATORY_TRACT | 2 refills | Status: DC | PRN

## 2017-05-16 NOTE — Telephone Encounter (Signed)
New Message  Pt c/o medication issue:  1. Name of Medication: Xarelto  2. How are you currently taking this medication (dosage and times per day)? Take 1 tablet (20 mg total) by mouth daily with supper  3. Are you having a reaction (difficulty breathing--STAT)? no  4. What is your medication issue? [facility] states the pt has a skin tear on his left forearm and has been moderately bleeding for 24 hrs   Threasa Beards also wants to get a verbal order for physical therapy. Please call

## 2017-05-16 NOTE — Telephone Encounter (Signed)
Patient saw PCP, Dr. Ola Spurr, today. Informed Melanie to call Dr. Ola Spurr office to see what their care plan is for patient after seeing him today. Informed Threasa Beards that her message would be sent to Dr. Harrington Challenger as well.

## 2017-05-16 NOTE — Progress Notes (Signed)
(+)   BM

## 2017-05-16 NOTE — Telephone Encounter (Signed)
Terry Martinez with [facility] left message on nurse line requesting verbal orders for PT.   She also wanted to inform you patient still having bleeding at site of skin tear on forearm.  Callback for verbal orders: [phone]. Danley Danker, RN [facility] RN)

## 2017-05-16 NOTE — Progress Notes (Signed)
[facility] Progress Note  Subjective:  Terry Martinez. is a 77 y.o. male who presents for hospital follow-up for CHF exacerbation and possible COPD exacerbation, admitted from 3/13/-05/13/17 for increased SOB and orthopnea. He was diuresed almost 13L and received steroid taper. He was also given a dulera inhaler. He also has CKDIII, significant PAD, CAD s/p 6 PCI (last PCI 2014), and afib on xarelto. His torsemide was increased from 20 mg to 40 mg upon discharge. Hospitalization complicated by AKI.   Since discharge, he denies shortness of breath or chest pain. He is still having a cough but not as much mucus production. His sister tried to pick up dulera at pharmacy but not covered by Medicare. However, he has not yet run out of inhaler provided during hospitalization. He reports Cardiology had started process of approval for nebulizer machine and because of this has not been using duoneb treatments since leaving hospital.   Today he is most concerned about bleeding of his right lower arm after hitting it against a door yesterday evening. His sister dressed it with gauze and wrapped it with an ace bandage but it continues to bleed whenever dressing taken off. He denies feeling weaker than usual or dizzy.   In addition, he says he would like to get some more supportive/diabetic-like shoes because of previous toe amputations and willl be meeting with podiatry about this next month. However, he declines foot exam today because of it being difficult to put his socks on.   Allergies  Allergen Reactions  . Zocor [Simvastatin] Hives and Rash    Social History   Tobacco Use  . Smoking status: Former Smoker    Packs/day: 2.50    Years: 46.00    Pack years: 115.00    Types: Cigarettes    Last attempt to quit: 07/22/1998    Years since quitting: 18.8  . Smokeless tobacco: Current User    Types: Snuff  Substance Use Topics  . Alcohol use: No    Alcohol/week: 0.0 oz     Objective: Blood pressure 125/80, pulse 84, temperature 97.6 F (36.4 C), temperature source Oral, weight 294 lb 12.8 oz (133.7 kg), SpO2 96 %. Body mass index is 36.85 kg/m. Constitutional: Pleasant, elderly male in NAD HENT: MMM Cardiovascular: RRR, S1, S2, no m/r/g.  Pulmonary/Chest: Effort normal and breath sounds normal.  Musculoskeletal: Using a rolling walker. 1+ pitting edema to mid tibia.  Neurological: AOx3, no focal deficits. Skin: Skin is warm and dry.Multiple scattered ecchymoses. Right lower arm skin tear over dorsal surface. 4-5 small skin tears, largest < 0.5 cm. Light oozing when dressing removed. No swelling or surrounding erythema noted.  Psychiatric: Normal mood and affect.  Vitals reviewed  Assessment/Plan: Acute on chronic systolic CHF (congestive heart failure) ([place]) - Resolved exacerbation after recent hospitalization. Denies orthopnea or shortness of breath but having some occasional wheezing without duonebs. Weight 5 lbs down from discharge with baseline thought to be around 285 lbs.  - Continue torsemide 40 mg BID.  COPD exacerbation ([place]) - Controlled at present. Continue dulera until he runs out. Spoke with patient's pharmacy and ordered symbicort (should be covered by medicare) as substitute once he finishes the dulera. - Encouraged patient to use his albuterol inhaler prn until receives nebulizer  CKD (chronic kidney disease), stage III ([place]) - Recent AKI in setting of CHF exacerbation. - Ordered BMP but unfortunately sample clotted. Discussed with his sister who said she will ask for this to be done at  his Cardiology appointment in a few days.  Skin tear of forearm without complication, right, initial encounter - Redressed wound with non-stick gauze after applying antibacterial ointment to help stop oozing. Applied tegaderm then wrapped with ace bandage. Precepted with Dr. Mingo Amber. Given multiple small tears, no area to stitch. Patient asymptomatic  (no dizziness, no increased fatigue) and wound does stop bleeding with pressure bandage so did not advise stopping xarelto at this time. - Recommended applying antibacterial ointment to help stop oozing and to avoid pulling off scab with dressing changes. Advised that the ER has other agents to help stop bleeding if wound does not stop bleeding.  - Would refer to wound care if does not heal after a couple weeks.   Also obtained UA because patient was concerned his urine smelled worse than usual. This was negative for signs of infection.   Follow-up prn depending on bleeding resolution.   Olene Floss, MD [place], PGY-3

## 2017-05-16 NOTE — Telephone Encounter (Signed)
Follow Up  [facility] is calling to check on the nebulizer machine fax she was suppose to receive, states she looked everywhere this morning and they never received anything. Please call Fax: [phone] Attention Mendel Ryder

## 2017-05-16 NOTE — Telephone Encounter (Signed)
Spoke with Dena about faxed order for the Nebulizer machine.[name]  She will seek order and if she has questions, she will follow up.[name]

## 2017-05-16 NOTE — Telephone Encounter (Signed)
Faxed prescription 3/27 @ 8:15am, completed by Robbie Lis, PA to [facility])

## 2017-05-16 NOTE — Telephone Encounter (Signed)
The number provided is not in service. If RN calls back, please ask for another number. It looks like patient was evaluated by physical therapy while he was in the hospital with recommendations for home health PT. If [facility] needs those records, let me know. Thank you.  Olene Floss, MD [place], PGY-3

## 2017-05-17 ENCOUNTER — Other Ambulatory Visit: Payer: Self-pay | Admitting: *Deleted

## 2017-05-17 LAB — HEMOGLOBIN A1C
ESTIMATED AVERAGE GLUCOSE: 120 mg/dL
Hgb A1c MFr Bld: 5.8 % — ABNORMAL HIGH (ref 4.8–5.6)

## 2017-05-17 NOTE — Patient Outreach (Signed)
[facility]) Care Management  05/17/2017  Terry Martinez. December 01, 1940 [id number]   CSW contacted patient today to update on search for denture needs/assistance.  CSW spoke with patient who gave verbal permission to share info with his sister, [name], who is HCPOA per their report.   CSW has advised them that patient will be eligible for new dentures after 02/04/2018 as it has to be 10 years or longer since they received dentures through Medicaid.  CSW has provided info to them on local providers and will mail info to them as well.  CSW has explained to them that he will need to become an established patient at the Dentist office and then they can assist with getting dentures approved and covered by Medicaid in December. Patient and sister are both very pleased and excited to hear this news.  Patient states his current dentures are so old and "chewed up" he can't use them.   CSW will plan f/u call in the next 2 weeks to confirm mailing was received.   Eduard Clos, MSW, [place] Worker  [place] [phone]

## 2017-05-18 ENCOUNTER — Telehealth: Payer: Self-pay | Admitting: Internal Medicine

## 2017-05-18 ENCOUNTER — Ambulatory Visit (INDEPENDENT_AMBULATORY_CARE_PROVIDER_SITE_OTHER): Payer: Medicare Other | Admitting: Internal Medicine

## 2017-05-18 VITALS — BP 144/60 | HR 58 | Temp 97.5°F | Ht 75.0 in | Wt 287.6 lb

## 2017-05-18 DIAGNOSIS — I2510 Atherosclerotic heart disease of native coronary artery without angina pectoris: Secondary | ICD-10-CM | POA: Diagnosis not present

## 2017-05-18 DIAGNOSIS — R58 Hemorrhage, not elsewhere classified: Secondary | ICD-10-CM | POA: Diagnosis not present

## 2017-05-18 DIAGNOSIS — Z7901 Long term (current) use of anticoagulants: Secondary | ICD-10-CM

## 2017-05-18 LAB — POCT HEMOGLOBIN: Hemoglobin: 8.9 g/dL — AB (ref 14.1–18.1)

## 2017-05-18 NOTE — Assessment & Plan Note (Signed)
For past four days. On Xarelto for chronic a.fib. Active bleeding throughout encounter today. Patient very well-appearing on exam, though does report dizziness. POC Hgb 8.9 today, decreased from 9.9 about 4d ago. Baseline appears to be ~10.5, so not significant drop. VSS - not tachycardia (HR 58). Pulse pressure wider than usual for patient at 419/62 (diastolic appears to be typically in 70s-80s), however patient is not orthostatic. Patient does have history of CHF, and weight is down ~10lbs from last measurement 4d ago. As patient appears stable and has relatively stable vitals, feel is appropriate for outpatient management. Will begin holding Xarelto today. Will also hold torsemide as patient at/below dry weight and with no signs of fluid overload today. Considered holding hydral, however as systolic is 229, do not feel that this is necessary. Discussed very strict return precautions. Also encouraged to keep tightly dressed with gauze over the weekend. Scheduled f/u appt on Monday with Dr. Cyndia Skeeters. Would resume Xarelto after 48hrs without bleeding. If still bleeding at f/u on Monday, will likely need to go to ED.  Precepted with Dr. Andria Frames.

## 2017-05-18 NOTE — Telephone Encounter (Signed)
New Message    Melanie with [facility] is calling in with a request for additional orders for physical therapy. Please call to discuss.

## 2017-05-18 NOTE — Progress Notes (Signed)
   Subjective:   Patient: Terry Martinez.       Birthdate: 03/30/1940       MRN: [id number]      HPI  Terry Martinez. is a 77 y.o. male presenting for walk in appt for L arm bleeding.   L arm bleeding Began 4d ago when he hit his arm against a brick wall at his house. Bleeding has not stopped since that time, and has not even slowed down. Presented to clinic today because he felt dizzy when he woke up, which concerned his wife. He is taking Xarelto for chronic a.fib, and has not skipped any doses. Has been wrapping arm tightly with gauze, though this has not improved bleeding at all.   Smoking status reviewed. Patient is former smoker.   Review of Systems See HPI.     Objective:  Physical Exam  Constitutional: He is oriented to person, place, and time.  Pleasant elderly male sitting in wheelchair in NAD  HENT:  Head: Normocephalic and atraumatic.  Pulmonary/Chest: Effort normal. No respiratory distress.  Neurological: He is alert and oriented to person, place, and time.  Skin:  Large superficial abrasion with multiple areas of active bleeding on dorsal surface of L forearm. No signs of infection. Significant ecchymoses on L arm.   Psychiatric: Affect and judgment normal.      Assessment & Plan:  Prolonged bleeding For past four days. On Xarelto for chronic a.fib. Active bleeding throughout encounter today. Patient very well-appearing on exam, though does report dizziness. POC Hgb 8.9 today, decreased from 9.9 about 4d ago. Baseline appears to be ~10.5, so not significant drop. VSS - not tachycardia (HR 58). Pulse pressure wider than usual for patient at 765/46 (diastolic appears to be typically in 70s-80s), however patient is not orthostatic. Patient does have history of CHF, and weight is down ~10lbs from last measurement 4d ago. As patient appears stable and has relatively stable vitals, feel is appropriate for outpatient management. Will begin holding Xarelto today. Will also hold  torsemide as patient at/below dry weight and with no signs of fluid overload today. Considered holding hydral, however as systolic is 503, do not feel that this is necessary. Discussed very strict return precautions. Also encouraged to keep tightly dressed with gauze over the weekend. Scheduled f/u appt on Monday with Dr. Cyndia Skeeters. Would resume Xarelto after 48hrs without bleeding. If still bleeding at f/u on Monday, will likely need to go to ED.  Precepted with Dr. Andria Frames.    Adin Hector, MD, MPH PGY-3 [place] [facility] Pager [phone]

## 2017-05-18 NOTE — Patient Instructions (Addendum)
It was nice meeting you today Terry Martinez!  Please STOP taking Xarelto and torsemide today. It is important to keep your arm tightly wrapped throughout the weekend, though you can change the dressing as needed if it becomes too bloody. Please note when the bleeding stops so you can tell Dr. Cyndia Skeeters on Monday.   We will see you back on Monday April 1 at 2:10 PM to make sure the bleeding has improved.   If any of the following things occur in the meantime, please call 911 or go to the emergency room immediately:  - You develop chest pain or shortness of breath - The bleeding worsens - You develop numbness, tingling, or weakness - You feel very lightheaded or dizzy, even when sitting down  If you have any questions or other concerns, you can also call our office at [phone]. Someone will always call you back right away, even if you call after hours.   If you have any questions or concerns, please feel free to call the clinic.   Be well,  Dr. Avon Gully

## 2017-05-18 NOTE — Telephone Encounter (Signed)
Melanie with PT calling again for verbal orders. Her call back [phone] Wallace Cullens, RN

## 2017-05-18 NOTE — Telephone Encounter (Signed)
Left detailed message for PT/[facility] to refer to PCP Dr. Reesa Chew for PT/home care orders and to call back with any questions or concerns.

## 2017-05-19 ENCOUNTER — Encounter: Payer: Self-pay | Admitting: Internal Medicine

## 2017-05-19 DIAGNOSIS — S51811A Laceration without foreign body of right forearm, initial encounter: Secondary | ICD-10-CM | POA: Insufficient documentation

## 2017-05-19 NOTE — Assessment & Plan Note (Signed)
-   Redressed wound with non-stick gauze after applying antibacterial ointment to help stop oozing. Applied tegaderm then wrapped with ace bandage. Precepted with Dr. Mingo Amber. Given multiple small tears, no area to stitch. Patient asymptomatic (no dizziness, no increased fatigue) and wound does stop bleeding with pressure bandage so did not advise stopping xarelto at this time. - Recommended applying antibacterial ointment to help stop oozing and to avoid pulling off scab with dressing changes. Advised that the ER has other agents to help stop bleeding if wound does not stop bleeding.  - Would refer to wound care if does not heal after a couple weeks.

## 2017-05-19 NOTE — Assessment & Plan Note (Signed)
-   Resolved exacerbation after recent hospitalization. Denies orthopnea or shortness of breath but having some occasional wheezing without duonebs. Weight 5 lbs down from discharge with baseline thought to be around 285 lbs.  - Continue torsemide 40 mg BID.

## 2017-05-19 NOTE — Assessment & Plan Note (Signed)
-   Recent AKI in setting of CHF exacerbation. - Ordered BMP but unfortunately sample clotted. Discussed with his sister who said she will ask for this to be done at his Cardiology appointment in a few days.

## 2017-05-19 NOTE — Assessment & Plan Note (Signed)
-   Controlled at present. Continue dulera until he runs out. Spoke with patient's pharmacy and ordered symbicort (should be covered by medicare) as substitute once he finishes the dulera. - Encouraged patient to use his albuterol inhaler prn until receives nebulizer

## 2017-05-21 ENCOUNTER — Encounter: Payer: Self-pay | Admitting: Student

## 2017-05-21 ENCOUNTER — Telehealth: Payer: Self-pay | Admitting: *Deleted

## 2017-05-21 ENCOUNTER — Ambulatory Visit (INDEPENDENT_AMBULATORY_CARE_PROVIDER_SITE_OTHER): Payer: Medicare Other | Admitting: Student

## 2017-05-21 ENCOUNTER — Other Ambulatory Visit: Payer: Self-pay

## 2017-05-21 VITALS — BP 122/50 | HR 82 | Temp 97.9°F | Ht 75.0 in | Wt 292.6 lb

## 2017-05-21 DIAGNOSIS — I2510 Atherosclerotic heart disease of native coronary artery without angina pectoris: Secondary | ICD-10-CM | POA: Diagnosis not present

## 2017-05-21 DIAGNOSIS — I4891 Unspecified atrial fibrillation: Secondary | ICD-10-CM | POA: Diagnosis not present

## 2017-05-21 DIAGNOSIS — S51811D Laceration without foreign body of right forearm, subsequent encounter: Secondary | ICD-10-CM

## 2017-05-21 DIAGNOSIS — S51811A Laceration without foreign body of right forearm, initial encounter: Secondary | ICD-10-CM | POA: Insufficient documentation

## 2017-05-21 DIAGNOSIS — I5042 Chronic combined systolic (congestive) and diastolic (congestive) heart failure: Secondary | ICD-10-CM | POA: Diagnosis not present

## 2017-05-21 MED ORDER — RIVAROXABAN 15 MG PO TABS
15.0000 mg | ORAL_TABLET | Freq: Every day | ORAL | 3 refills | Status: DC
Start: 2017-05-21 — End: 2017-07-17

## 2017-05-21 NOTE — Telephone Encounter (Signed)
Placed orders with [facility].

## 2017-05-21 NOTE — Telephone Encounter (Signed)
-----   Message from [facility], MD sent at 05/21/2017 10:54 AM EDT ----- Please let patient/his sister know A1c is stable at 5.8--which is still prediabetes range. He has been around 5.7/5.8 in recent years.

## 2017-05-21 NOTE — Progress Notes (Signed)
Subjective:    Terry Martinez is a 77 y.o. old male here for follow-up on right forearm laceration.  HPI Right forearm laceration/bleeding: Patient had a traumatic injury with subsequent bleeding after he hit his arm against a break for these hours.  He was evaluated for these 3 days ago.  He was taken off Xarelto due to active bleeding.  He was also taken off his torsemide due to lightheadedness.  Today, he states that the bleeding has subsided.  Sister has been helping with the dressing change.  Last dressing change was yesterday.  He also denies lightheadedness, palpitation, dyspnea or chest pain.  His sister thinks his leg swelling has actually improved.  He denies fever or chills.  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[place]); Venous stasis dermatitis of both lower extremities; Acquired absence of right great toe ([place]); PVD (peripheral vascular disease) ([place]); H/O amputation of lesser toe, right ([place]); Hx of adenomatous colonic polyps; Achilles tendon contracture, bilateral; RUQ abdominal pain; Bilateral foot pain; Acute on chronic congestive heart failure ([place]); Chronic combined systolic and diastolic heart failure ([place]); CKD (chronic kidney disease), stage III ([place]); Aortic insufficiency; Permanent atrial fibrillation ([place]); S/P amputation of lesser toe, left ([place]); Acute on chronic systolic CHF (congestive heart failure) ([place]); Chronic renal insufficiency, stage IV (severe) ([place]); Shortness of breath; COPD exacerbation ([place]); Prolonged  bleeding; Skin tear of forearm without complication, right, initial encounter; and Laceration of skin of right forearm on their problem list.   has a past medical history of Acute on chronic combined systolic and diastolic CHF (congestive heart failure) ([place]) (01/26/2017), Anemia, Aortic insufficiency (01/26/2017), Arthritis, Basal cell carcinoma, Charcot's arthropathy, Chronic combined systolic and diastolic CHF (congestive heart failure) ([place]), Chronic pain, CKD (chronic kidney disease), stage III ([place]) (01/26/2017), Constipation, COPD (chronic obstructive pulmonary disease) ([place]), Coronary artery disease, DVT, lower extremity ([place]), Dyspnea, Family history of adverse reaction to anesthesia, Gout, Heart murmur, History of blood transfusion (1960s), History of kidney stones, Hyperlipidemia, Hypertension, Incisional hernia, Myocardial infarction ([place]), Osteomyelitis of toe of left foot ([place]), Peripheral artery disease ([place]), Permanent atrial fibrillation ([place]), Pneumonia, Squamous carcinoma, and Subclavian artery stenosis, left ([place]).  FH:  Family History  Problem Relation Age of Onset  . Diabetes Mother   . Cancer Mother        Right Breast  . Heart disease Mother   . Hyperlipidemia Mother   . Hypertension Mother   . Lymphoma Mother        Chemo  . Lymphoma Father   . Alcohol abuse Sister   . Heart disease Sister   . Hyperlipidemia Sister   . Hypertension Sister   . Stroke Sister   . Heart disease Brother   . Depression Brother   . Early death Brother   . Hyperlipidemia Brother   . Hypertension Brother   . Liver cancer Brother   . Heart disease Maternal Grandmother   . Kidney disease Maternal Grandmother   . Heart disease Maternal Grandfather   . Kidney disease Maternal Grandfather   . Emphysema Maternal Grandfather   . Lung cancer Sister     [facility] Social History  Tobacco Use  . Smoking status: Former Smoker    Packs/day: 2.50    Years: 46.00    Pack years: 115.00    Types:  Cigarettes    Last attempt to quit: 07/22/1998    Years since quitting: 18.8  . Smokeless tobacco: Former Systems developer    Types: Snuff  Substance Use Topics  . Alcohol use: No    Alcohol/week: 0.0 oz  . Drug use: No    Review of Systems Review of systems negative except for pertinent positives and negatives in history of present illness above.     Objective:     Vitals:   05/21/17 1344  BP: (!) 122/50  Pulse: 82  Temp: 97.9 F (36.6 C)  TempSrc: Oral  SpO2: 97%  Weight: 292 lb 9.6 oz (132.7 kg)  Height: 6\' 3"  (1.905 m)   Body mass index is 36.57 kg/m.  Physical Exam  GEN: appears well & comfortable. No apparent distress. CVS: Irregular, nl s1 & s2, no murmurs, 2+ pitting edema to his knees through compression stocking RESP: no IWOB, diminished air movement on the right compared to left.  No crackles or wheeze MSK: no focal tenderness or notable swelling SKIN: Significant ecchymosis over his right forearm, superficial skin lacerations over the dorsal aspect of his forearm, no purulent discharge or increased warmth to touch.  See picture below for more.  NEURO: alert and oiented appropriately, no gross deficits   PSYCH: euthymic mood with congruent affect       Assessment and Plan:  1. Laceration of skin of right forearm, subsequent encounter: no signs of infection or inflammation.  Bleeding stopped.  Redressed with Xeroform and gauze wrap. Gave his sister more Xeroform and gauze wrap to change dressing in 3-4 days. Recommended keeping it dry.  Discussed return precautions.  Resume his Xarelto at 15 mg daily given his renal function and his high chads vasc score of 6.  Encouraged him to discuss this with his cardiologist.  He says he has an upcoming appointment with his cardiologist in 4 days.  2. Atrial fibrillation, unspecified type [facility]): resumed his Xarelto at 15 mg daily given his reduced renal function. - Rivaroxaban (XARELTO) 15 MG TABS tablet; Take 1 tablet (15 mg total)  by mouth daily with supper.  Dispense: 30 tablet; Refill: 3  3. Chronic combined systolic and diastolic heart failure ([place]): Stable.  Weight is overall stable off his diuretics. Torsemide was held due to lightheadedness when he presented to clinic 3 days ago. Denies dyspnea or chest pain.  No longer lightheaded either.  So resumed his torsemide today.  He has follow-up with cardiologist in 4 days.   Return if symptoms worsen or fail to improve.  Mercy Riding, MD 05/21/17 Pager: [phone]  Discussed patient with Dr. Andria Frames

## 2017-05-21 NOTE — Patient Outreach (Signed)
[facility]) Care Management   05/21/2017  Terry Martinez. 05/12/1940 [id number]  Terry Martinez. is an 77 y.o. male  Subjective: Patient reports he feels like he is doing better since he got home.  Patient lives with his sister.  Patient reports that he pulled his skin off his right wrist.  Saw primary MD last week and was taken off xarelto and torsemide.  Reports that he has another follow up appointment with primary MD today to review restarting medications.  Patient and family report decreased swelling in his legs. He wears compression hose daily. Patient reports that his rolling walker is very helpful. Patient did received his nebulizer in the mail and a therapist is coming to instruct him on usage.  Patient weighs daily and keeps a log. Has all his follow up appointments planned. Has transportation to pending appointments.   Objective:  Awake and alert. Ambulating well with rolling walker.   Vitals:   05/21/17 1040  BP: 140/62  Pulse: 65  Resp: 20  SpO2: 97%  Weight: 286 lb (129.7 kg)  Height: 1.905 m (6' 3")   Review of Systems  Constitutional: Negative.   Eyes: Negative.   Respiratory: Positive for cough and shortness of breath.        Shortness of breath with walking.   Cardiovascular: Positive for leg swelling.  Genitourinary: Negative.   Musculoskeletal: Positive for joint pain.       Left shoulder.   Skin:       Skin tear to the right wrist for 6 days.  Covered by MD. Had severe bleeding. MD took off xarelto.   Neurological: Negative.   Endo/Heme/Allergies: Bruises/bleeds easily.  Psychiatric/Behavioral: Negative.     Physical Exam  Constitutional: He is oriented to person, place, and time. He appears well-developed and well-nourished.  Cardiovascular: Normal rate and intact distal pulses.  Irregular heart beat.   Respiratory: Effort normal and breath sounds normal.  Lungs clear  GI: Soft. Bowel sounds are normal.  Musculoskeletal: He  exhibits edema.  Right foot without great toe and baby toe, left foot without 4th toe.  Left foot with scabbed area to the great toe and 2nd toe. No signs of infection.   2 plus edema above calf. Wearing compression hose on both feet.   Neurological: He is alert and oriented to person, place, and time.  Skin: Skin is warm and dry.  Dressing intact to the right wrist,     Encounter Medications:   Outpatient Encounter Medications as of 05/21/2017  Medication Sig Note  . acetaminophen (TYLENOL) 500 MG tablet Take 1,000 mg by mouth every 8 (eight) hours as needed for moderate pain or headache.    . albuterol (PROVENTIL HFA;VENTOLIN HFA) 108 (90 BASE) MCG/ACT inhaler Inhale 2 puffs into the lungs every 6 (six) hours as needed for wheezing or shortness of breath.   [name] albuterol (VENTOLIN HFA) 108 (90 Base) MCG/ACT inhaler Inhale 2 puffs into the lungs every 6 (six) hours as needed for wheezing or shortness of breath.   . allopurinol (ZYLOPRIM) 100 MG tablet TAKE ONE TABLET BY MOUTH ONCE DAILY IN THE EVENING (Patient taking differently: Take 100 mg by mouth daily. Take 100 mg in the morning and 300 mg in the evening)   . budesonide-formoterol (SYMBICORT) 160-4.5 MCG/ACT inhaler Inhale 2 puffs into the lungs 2 (two) times daily.   . Cholecalciferol (VITAMIN D-3 PO) Take 1 tablet by mouth every morning.    . hydrALAZINE (APRESOLINE) 25  MG tablet Take 0.5 tablets (12.5 mg total) by mouth every 8 (eight) hours.   . isosorbide mononitrate (IMDUR) 30 MG 24 hr tablet Take 0.5 tablets (15 mg total) by mouth daily.   . metoprolol succinate (TOPROL-XL) 25 MG 24 hr tablet Take 0.5 tablets (12.5 mg total) by mouth daily.   . metroNIDAZOLE (METROGEL) 1 % gel Apply 1 application topically daily as needed (SKIN IRRITATION/ROSACEA.).    [name] mometasone-formoterol (DULERA) 100-5 MCG/ACT AERO Inhale 2 puffs into the lungs 2 (two) times daily. 05/15/2017: Has sample from hospital. Reports this medication not covered by  insurance  . polyethylene glycol powder (GLYCOLAX/MIRALAX) powder DISSOLVE 17G (1 CAPFUL) OF POWDER IN 8 OUNCES OF LIQUID AND DRINK 2 TIMES PER DAY AS NEEDED FOR MILD CONSTIPATION   . potassium chloride SA (K-DUR,KLOR-CON) 20 MEQ tablet Take 1 tablet (20 mEq total) by mouth 2 (two) times daily.   . pravastatin (PRAVACHOL) 40 MG tablet Take 1 tablet (40 mg total) by mouth daily at 6 PM.   . vitamin C (ASCORBIC ACID) 500 MG tablet Take 500 mg by mouth daily.   . Carboxymethylcellul-Glycerin (LUBRICATING EYE DROPS OP) Place 1 drop into both eyes 4 (four) times daily as needed (dry eyes).    [name] ipratropium-albuterol (DUONEB) 0.5-2.5 (3) MG/3ML SOLN Take 3 mLs by nebulization every 6 (six) hours as needed (wheezing). Dx:J44.1 (Patient not taking: Reported on 05/15/2017) 05/21/2017: Has nebulizer and is awaiting respiratory therapy to show patient how to use.   . nitroGLYCERIN (NITROSTAT) 0.4 MG SL tablet Place 1 tablet (0.4 mg total) under the tongue every 5 (five) minutes as needed for chest pain. (Patient not taking: Reported on 05/21/2017)   . rivaroxaban (XARELTO) 20 MG TABS tablet Take 1 tablet (20 mg total) by mouth daily with supper. (Patient not taking: Reported on 05/21/2017)   . torsemide (DEMADEX) 20 MG tablet Take 2 tablets (40 mg total) by mouth 2 (two) times daily. (Patient not taking: Reported on 05/21/2017)    No facility-administered encounter medications on file as of 05/21/2017.     Functional Status:   In your present state of health, do you have any difficulty performing the following activities: 05/21/2017 05/03/2017  Hearing? N Y  Vision? N N  Difficulty concentrating or making decisions? N N  Walking or climbing stairs? Y Y  Dressing or bathing? N Y  Doing errands, shopping? [name] Donning  Preparing Food and eating ? N -  Using the Toilet? N -  In the past six months, have you accidently leaked urine? N -  Do you have problems with loss of bowel control? N -  Managing your Medications? Y -   Comment sister is managing medications.  -  Managing your Finances? N -  Housekeeping or managing your Housekeeping? N -  Some recent data might be hidden    Fall/Depression Screening:    Fall Risk  05/21/2017 11/08/2016 10/18/2016  Falls in the past year? No No No  Risk for fall due to : - - -   PHQ 2/9 Scores 05/21/2017 11/08/2016 10/18/2016 11/16/2015 07/27/2015 06/23/2015 02/04/2015  PHQ - 2 Score 0 0 0 0 0 0 0  PHQ- 9 Score - - - 0 - - -    Assessment:   (1) Reviewed [facility] transition of care program. Received new patient packet while in patient. Reviewed consent and no changes were needed. Provided my contact information, welcome letter, low salt diet poster, heart failure booklet and [facility] calendar.  (2)  skin tear to the right wrist, abrasions to the left great toe and left 2nd toe. (3) recent admission for heart failure and fluid overload, following low salt diet. (4) takes all medications as prescribed. (5) ambulates well with walker and will start PT soon. (6) active with home health. (7) chronic afib---off anticoagulant   Plan:  (1)Consent on file. (2) dressing not changed as it was dry.  Patient to see primary MD today and assessment of wound. (3) Reviewed heart failure zones and importance of daily weights.  Reviewed with patient and sister when to call MD. Provided heart failures zones and heart failure patient educations booklet.  (4) encouraged patient to take all medications as prescribed and not stop taking medications without MD approval. (5) encouraged patient to be active. (6) reviewed with patient that he knows how to reach home health agency.  (7) reviewed signs and symptoms of stroke and when to call 911.  Care planning and goal setting with patient and primary goal is to avoid a readmission.   Next outreach planned in 1 week. This note and barrier letter sent to primary MD. [facility] CM Care Plan Problem One     Most Recent Value  Care Plan Problem One  Recent admission for  COPD and CHF  Role Documenting the Problem One  Care Management [place] for Problem One  Active  [facility] Long Term Goal   Patient will report no readmissions for heart failure or COPD in the next 91 days.   [facility] Long Term Goal Start Date  05/15/17  Interventions for Problem One Long Term Goal  Home visit completed. Reviewed heart failure, a fib and COPD zones. Encouraged follow up with MD.   [facility] CM Short Term Goal #1   Patient will verbalize arrival of nebulizer and rolling walker in the next 2 days.   [facility] CM Short Term Goal #1 Start Date  05/15/17  [facility] CM Short Term Goal #1 Met Date  05/21/17  [facility] CM Short Term Goal #2   Patient will verbalize weighing daily for the next 30 days.   [facility] CM Short Term Goal #2 Start Date  05/15/17  Interventions for Short Term Goal #2  Encouraged daily weights. Reviewed importance of a daily log and to take log to MD. Reviewed heart failure zones and when to call MD fior weight gain.      Tomasa Rand, RN, BSN, CEN [facility] [employer] [phone]

## 2017-05-21 NOTE — Telephone Encounter (Signed)
Pt sister informed. Deseree Blount, CMA  

## 2017-05-21 NOTE — Patient Instructions (Signed)
It was great seeing you today! We have addressed the following issues today  Right arm laceration: We have applied a new dressing.  He can keep this dressing on for 3-4 days.  We have also resume his Xarelto at 15 mg daily.  Please let Dr. Harrington Challenger know about your Xarelto dose change. you can resume your torsemide.  Please let us know if you have further bleeding, lightheadedness, trouble breathing, chest pain or other symptoms concerning to you.  If we did any lab work today, and the results require attention, either me or my nurse will get in touch with you. If everything is normal, you will get a letter in mail and a message via . If you don't hear from [place] in two weeks, please give [place] a call. Otherwise, we look forward to seeing you again at your next visit. If you have any questions or concerns before then, please call the clinic at [phone].  Please bring all your medications to every doctors visit  Sign up for My Chart to have easy access to your labs results, and communication with your Primary care physician.    Please check-out at the front desk before leaving the clinic.    Take Care,   Dr. Cyndia Skeeters

## 2017-05-22 ENCOUNTER — Other Ambulatory Visit: Payer: Self-pay | Admitting: Nephrology

## 2017-05-22 ENCOUNTER — Ambulatory Visit
Admission: RE | Admit: 2017-05-22 | Discharge: 2017-05-22 | Disposition: A | Discharge status: Home / Self Care | Payer: Medicare Other | Source: Ambulatory Visit | Attending: Nephrology | Admitting: Nephrology

## 2017-05-22 DIAGNOSIS — N183 Chronic kidney disease, stage 3 unspecified: Secondary | ICD-10-CM

## 2017-05-22 DIAGNOSIS — N189 Chronic kidney disease, unspecified: Secondary | ICD-10-CM | POA: Diagnosis not present

## 2017-05-23 ENCOUNTER — Telehealth: Payer: Self-pay

## 2017-05-23 DIAGNOSIS — J441 Chronic obstructive pulmonary disease with (acute) exacerbation: Secondary | ICD-10-CM | POA: Diagnosis not present

## 2017-05-23 DIAGNOSIS — I2510 Atherosclerotic heart disease of native coronary artery without angina pectoris: Secondary | ICD-10-CM | POA: Diagnosis not present

## 2017-05-23 DIAGNOSIS — I5043 Acute on chronic combined systolic (congestive) and diastolic (congestive) heart failure: Secondary | ICD-10-CM | POA: Diagnosis not present

## 2017-05-23 DIAGNOSIS — I130 Hypertensive heart and chronic kidney disease with heart failure and stage 1 through stage 4 chronic kidney disease, or unspecified chronic kidney disease: Secondary | ICD-10-CM | POA: Diagnosis not present

## 2017-05-23 DIAGNOSIS — N183 Chronic kidney disease, stage 3 (moderate): Secondary | ICD-10-CM | POA: Diagnosis not present

## 2017-05-23 DIAGNOSIS — D631 Anemia in chronic kidney disease: Secondary | ICD-10-CM | POA: Diagnosis not present

## 2017-05-23 NOTE — Telephone Encounter (Signed)
Received call on nurse line from [employer] with [facility] needing verbal orders for wound care for arm on this patient.  Call back is [phone]. Danley Danker, RN [facility] RN)

## 2017-05-23 NOTE — Telephone Encounter (Signed)
Called Amber to give verbal orders, thanks

## 2017-05-24 ENCOUNTER — Ambulatory Visit (INDEPENDENT_AMBULATORY_CARE_PROVIDER_SITE_OTHER): Payer: Medicare Other | Admitting: Internal Medicine

## 2017-05-24 ENCOUNTER — Encounter: Payer: Self-pay | Admitting: Internal Medicine

## 2017-05-24 VITALS — BP 152/76 | HR 96 | Ht 75.0 in | Wt 295.0 lb

## 2017-05-24 DIAGNOSIS — J42 Unspecified chronic bronchitis: Secondary | ICD-10-CM

## 2017-05-24 DIAGNOSIS — I5042 Chronic combined systolic (congestive) and diastolic (congestive) heart failure: Secondary | ICD-10-CM | POA: Diagnosis not present

## 2017-05-24 DIAGNOSIS — E782 Mixed hyperlipidemia: Secondary | ICD-10-CM | POA: Diagnosis not present

## 2017-05-24 DIAGNOSIS — D631 Anemia in chronic kidney disease: Secondary | ICD-10-CM | POA: Diagnosis not present

## 2017-05-24 DIAGNOSIS — I4819 Other persistent atrial fibrillation: Secondary | ICD-10-CM

## 2017-05-24 DIAGNOSIS — N183 Chronic kidney disease, stage 3 (moderate): Secondary | ICD-10-CM | POA: Diagnosis not present

## 2017-05-24 DIAGNOSIS — I481 Persistent atrial fibrillation: Secondary | ICD-10-CM | POA: Diagnosis not present

## 2017-05-24 DIAGNOSIS — J441 Chronic obstructive pulmonary disease with (acute) exacerbation: Secondary | ICD-10-CM | POA: Diagnosis not present

## 2017-05-24 DIAGNOSIS — I2510 Atherosclerotic heart disease of native coronary artery without angina pectoris: Secondary | ICD-10-CM | POA: Diagnosis not present

## 2017-05-24 DIAGNOSIS — I5043 Acute on chronic combined systolic (congestive) and diastolic (congestive) heart failure: Secondary | ICD-10-CM | POA: Diagnosis not present

## 2017-05-24 DIAGNOSIS — I130 Hypertensive heart and chronic kidney disease with heart failure and stage 1 through stage 4 chronic kidney disease, or unspecified chronic kidney disease: Secondary | ICD-10-CM | POA: Diagnosis not present

## 2017-05-24 LAB — BASIC METABOLIC PANEL
BUN / CREAT RATIO: 23 (ref 10–24)
BUN: 51 mg/dL — ABNORMAL HIGH (ref 8–27)
CO2: 27 mmol/L (ref 20–29)
Calcium: 9 mg/dL (ref 8.6–10.2)
Chloride: 95 mmol/L — ABNORMAL LOW (ref 96–106)
Creatinine, Ser: 2.18 mg/dL — ABNORMAL HIGH (ref 0.76–1.27)
GFR calc Af Amer: 33 mL/min/{1.73_m2} — ABNORMAL LOW (ref >59)
GFR calc non Af Amer: 28 mL/min/{1.73_m2} — ABNORMAL LOW (ref >59)
GLUCOSE: 110 mg/dL — AB (ref 65–99)
POTASSIUM: 3.8 mmol/L (ref 3.5–5.2)
Sodium: 138 mmol/L (ref 134–144)

## 2017-05-24 LAB — PRO B NATRIURETIC PEPTIDE: NT-PRO BNP: 13126 pg/mL — AB (ref 0–486)

## 2017-05-24 NOTE — Patient Instructions (Signed)
Your physician recommends that you continue on your current medications as directed. Please refer to the Current Medication list given to you today. Your physician recommends that you return for lab work today (bmet, bnp) Your physician recommends that you schedule a follow-up appointment --see below.
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
## 2017-05-25 DIAGNOSIS — J441 Chronic obstructive pulmonary disease with (acute) exacerbation: Secondary | ICD-10-CM | POA: Diagnosis not present

## 2017-05-25 DIAGNOSIS — I5043 Acute on chronic combined systolic (congestive) and diastolic (congestive) heart failure: Secondary | ICD-10-CM | POA: Diagnosis not present

## 2017-05-25 DIAGNOSIS — N183 Chronic kidney disease, stage 3 (moderate): Secondary | ICD-10-CM | POA: Diagnosis not present

## 2017-05-25 DIAGNOSIS — I2510 Atherosclerotic heart disease of native coronary artery without angina pectoris: Secondary | ICD-10-CM | POA: Diagnosis not present

## 2017-05-25 DIAGNOSIS — I130 Hypertensive heart and chronic kidney disease with heart failure and stage 1 through stage 4 chronic kidney disease, or unspecified chronic kidney disease: Secondary | ICD-10-CM | POA: Diagnosis not present

## 2017-05-25 DIAGNOSIS — D631 Anemia in chronic kidney disease: Secondary | ICD-10-CM | POA: Diagnosis not present

## 2017-05-28 ENCOUNTER — Telehealth: Payer: Self-pay | Admitting: Internal Medicine

## 2017-05-28 ENCOUNTER — Encounter: Payer: Self-pay | Admitting: *Deleted

## 2017-05-28 ENCOUNTER — Other Ambulatory Visit: Payer: Self-pay

## 2017-05-28 ENCOUNTER — Other Ambulatory Visit: Payer: Self-pay | Admitting: *Deleted

## 2017-05-28 ENCOUNTER — Ambulatory Visit: Payer: Medicare Other | Admitting: Internal Medicine

## 2017-05-28 ENCOUNTER — Ambulatory Visit: Payer: Self-pay

## 2017-05-28 DIAGNOSIS — D631 Anemia in chronic kidney disease: Secondary | ICD-10-CM | POA: Diagnosis not present

## 2017-05-28 DIAGNOSIS — N183 Chronic kidney disease, stage 3 (moderate): Secondary | ICD-10-CM | POA: Diagnosis not present

## 2017-05-28 DIAGNOSIS — J441 Chronic obstructive pulmonary disease with (acute) exacerbation: Secondary | ICD-10-CM | POA: Diagnosis not present

## 2017-05-28 DIAGNOSIS — I5043 Acute on chronic combined systolic (congestive) and diastolic (congestive) heart failure: Secondary | ICD-10-CM | POA: Diagnosis not present

## 2017-05-28 DIAGNOSIS — I130 Hypertensive heart and chronic kidney disease with heart failure and stage 1 through stage 4 chronic kidney disease, or unspecified chronic kidney disease: Secondary | ICD-10-CM | POA: Diagnosis not present

## 2017-05-28 DIAGNOSIS — I5042 Chronic combined systolic (congestive) and diastolic (congestive) heart failure: Secondary | ICD-10-CM

## 2017-05-28 DIAGNOSIS — I2510 Atherosclerotic heart disease of native coronary artery without angina pectoris: Secondary | ICD-10-CM | POA: Diagnosis not present

## 2017-05-28 MED ORDER — POTASSIUM CHLORIDE CRYS ER 20 MEQ PO TBCR: EXTENDED_RELEASE_TABLET | ORAL | 3 refills | Status: DC

## 2017-05-28 MED ORDER — TORSEMIDE 20 MG PO TABS: 60.0000 mg | ORAL_TABLET | Freq: Two times a day (BID) | ORAL | 3 refills | Status: DC

## 2017-05-28 NOTE — Telephone Encounter (Signed)
Pt is not SOB. Had 5 pound weight gain since yesterday.  Reviewed with Dr. Saunders Revel (DOD). Increase torsemide to 60 mg BID and potassium 40 every AM and 20 every PM. Recommends visit in office either this Fri or next Monday with BMET.  Reviewed with patient's sister. She will adjust medicines as directed above. Aware that at this time there are no appointments available for Fri or Mon.  Aware I will call her back to schedule follow up.

## 2017-05-28 NOTE — Telephone Encounter (Signed)
New Message  Pt c/o swelling: STAT is pt has developed SOB within 24 hours  1) How much weight have you gained and in what time span? 5 lbs over night  2) If swelling, where is the swelling located? Stomach and feet  3) Are you currently taking a fluid pill? yes  4) Are you currently SOB? no  5) Do you have a log of your daily weights (if so, list)? yes  6) Have you gained 3 pounds in a day or 5 pounds in a week? 5lbs over night  7) Have you traveled recently? no

## 2017-05-28 NOTE — Patient Outreach (Signed)
Transition of care: Placed call to patient and spoke with sister Garald Braver who reports patient is doing well except for a 5 pound weight gain over night. Sister reports after church yesterday that patient went to eat at a buffet.  She reports that she called home health nurse this morning and call cardiology office and is waiting for a call back. Reports increased swelling of legs and abdomen. Denies any chest pain or shortness of breath.  Patient was seen by PT today per sister.   Sister denies patient missing any medications.   PLAN: reviewed with sister action plan and to wait for direction from cardiology/ Will plan to follow up in 1 week. Encouraged sister to call me sooner if needed.  Tomasa Rand, RN, BSN, CEN [facility] [employer] [phone]

## 2017-05-28 NOTE — Telephone Encounter (Signed)
I am not sure what happened. May have double clicked by accident and closed/completed Agree with planw

## 2017-05-28 NOTE — Patient Outreach (Signed)
[facility]) Care Management  05/28/2017  Terry Martinez. 09-04-1940 [id number]  CSW made contact with pt today and spoke with [name], his sister/HCPOA.  She reports they did not receive the info sent in mail for Denture options and CSW will send again.  CSW provided her with name, # and address for Dr Maricela Bo in [facility] where they do accept Medicaid for dentures.  CSW reminded her that he needs to establish standard dental care there prior to the dentures being arranged, and that he will have to wait until December to get the dentures covered by Medicaid.  CSW offered my contact # as well for further questions or concerns.  CSW will close CSW referral and update [facility] RNCM, Tomasa Rand, RN, as well as PCP.   Eduard Clos, MSW, [place] Worker  [place] [phone]

## 2017-05-29 DIAGNOSIS — I130 Hypertensive heart and chronic kidney disease with heart failure and stage 1 through stage 4 chronic kidney disease, or unspecified chronic kidney disease: Secondary | ICD-10-CM | POA: Diagnosis not present

## 2017-05-29 DIAGNOSIS — I2510 Atherosclerotic heart disease of native coronary artery without angina pectoris: Secondary | ICD-10-CM | POA: Diagnosis not present

## 2017-05-29 DIAGNOSIS — D631 Anemia in chronic kidney disease: Secondary | ICD-10-CM | POA: Diagnosis not present

## 2017-05-29 DIAGNOSIS — N183 Chronic kidney disease, stage 3 (moderate): Secondary | ICD-10-CM | POA: Diagnosis not present

## 2017-05-29 DIAGNOSIS — I5043 Acute on chronic combined systolic (congestive) and diastolic (congestive) heart failure: Secondary | ICD-10-CM | POA: Diagnosis not present

## 2017-05-29 DIAGNOSIS — J441 Chronic obstructive pulmonary disease with (acute) exacerbation: Secondary | ICD-10-CM | POA: Diagnosis not present

## 2017-05-30 DIAGNOSIS — D631 Anemia in chronic kidney disease: Secondary | ICD-10-CM | POA: Diagnosis not present

## 2017-05-30 DIAGNOSIS — N183 Chronic kidney disease, stage 3 (moderate): Secondary | ICD-10-CM | POA: Diagnosis not present

## 2017-05-30 DIAGNOSIS — I5043 Acute on chronic combined systolic (congestive) and diastolic (congestive) heart failure: Secondary | ICD-10-CM | POA: Diagnosis not present

## 2017-05-30 DIAGNOSIS — J441 Chronic obstructive pulmonary disease with (acute) exacerbation: Secondary | ICD-10-CM | POA: Diagnosis not present

## 2017-05-30 DIAGNOSIS — I2510 Atherosclerotic heart disease of native coronary artery without angina pectoris: Secondary | ICD-10-CM | POA: Diagnosis not present

## 2017-05-30 DIAGNOSIS — I130 Hypertensive heart and chronic kidney disease with heart failure and stage 1 through stage 4 chronic kidney disease, or unspecified chronic kidney disease: Secondary | ICD-10-CM | POA: Diagnosis not present

## 2017-05-30 NOTE — Telephone Encounter (Signed)
No availability for appointment this week or early next week currently.  I spoke with patient's sister this morning.  Pt reports dry mouth.  Is drinking 3-4 bottles of water daily.   No increase in SOB, weight is down 2 pounds today.  Scheduled patient for labs this Friday.  (BMET, BNP)

## 2017-05-30 NOTE — Addendum Note (Signed)
Addended by: Rodman Key on: 05/30/2017 09:30 AM   Modules accepted: Orders

## 2017-05-31 ENCOUNTER — Other Ambulatory Visit: Payer: Medicare Other | Admitting: *Deleted

## 2017-05-31 DIAGNOSIS — I5042 Chronic combined systolic (congestive) and diastolic (congestive) heart failure: Secondary | ICD-10-CM

## 2017-05-31 LAB — BASIC METABOLIC PANEL
BUN/Creatinine Ratio: 21 (ref 10–24)
BUN: 44 mg/dL — ABNORMAL HIGH (ref 8–27)
CALCIUM: 8.9 mg/dL (ref 8.6–10.2)
CHLORIDE: 96 mmol/L (ref 96–106)
CO2: 24 mmol/L (ref 20–29)
Creatinine, Ser: 2.13 mg/dL — ABNORMAL HIGH (ref 0.76–1.27)
GFR calc Af Amer: 34 mL/min/{1.73_m2} — ABNORMAL LOW (ref >59)
GFR calc non Af Amer: 29 mL/min/{1.73_m2} — ABNORMAL LOW (ref >59)
Glucose: 105 mg/dL — ABNORMAL HIGH (ref 65–99)
POTASSIUM: 4.2 mmol/L (ref 3.5–5.2)
Sodium: 137 mmol/L (ref 134–144)

## 2017-05-31 LAB — PRO B NATRIURETIC PEPTIDE: NT-Pro BNP: 11372 pg/mL — ABNORMAL HIGH (ref 0–486)

## 2017-06-01 ENCOUNTER — Telehealth: Payer: Self-pay | Admitting: *Deleted

## 2017-06-01 ENCOUNTER — Other Ambulatory Visit: Payer: Medicare Other

## 2017-06-01 DIAGNOSIS — I5042 Chronic combined systolic (congestive) and diastolic (congestive) heart failure: Secondary | ICD-10-CM

## 2017-06-01 DIAGNOSIS — I5023 Acute on chronic systolic (congestive) heart failure: Secondary | ICD-10-CM

## 2017-06-01 MED ORDER — METOLAZONE 2.5 MG PO TABS: ORAL_TABLET | ORAL | 0 refills | Status: DC

## 2017-06-01 NOTE — Telephone Encounter (Signed)
Notes recorded by Fay Records, MD on 06/01/2017 at [place] PM EDT Patient can try metalozone 2.5 mg 2x per week max  Take 30 min before am torsemide dose  F/U BMET in 2 wks   Spoke with sister and informed her of recommendations per Dr. Harrington Challenger.  Moved pt's labs up to 4/26.  Sister verbalized understanding and was appreciative for call.

## 2017-06-01 NOTE — Telephone Encounter (Signed)
Spoke with pt's sister [name], ok per DPR. Went over results and recommendations. Scheduled pt to have repeat labs on 5/3.

## 2017-06-01 NOTE — Addendum Note (Signed)
Addended by: Loren Racer on: 06/01/2017 04:23 PM   Modules accepted: Orders

## 2017-06-01 NOTE — Telephone Encounter (Signed)
-----   Message from Fay Records, MD sent at 05/31/2017 11:18 PM EDT ----- Kidney function is relatively stable from 1 wk ago Fluid appears to be stable    Keep on same meds    Check BMET in 3 wks with BNP

## 2017-06-04 ENCOUNTER — Ambulatory Visit: Payer: Self-pay

## 2017-06-04 ENCOUNTER — Telehealth: Payer: Self-pay

## 2017-06-04 ENCOUNTER — Ambulatory Visit (INDEPENDENT_AMBULATORY_CARE_PROVIDER_SITE_OTHER): Payer: Medicare Other | Admitting: Orthopedic Surgery

## 2017-06-04 ENCOUNTER — Encounter (INDEPENDENT_AMBULATORY_CARE_PROVIDER_SITE_OTHER): Payer: Self-pay | Admitting: Orthopedic Surgery

## 2017-06-04 DIAGNOSIS — J441 Chronic obstructive pulmonary disease with (acute) exacerbation: Secondary | ICD-10-CM | POA: Diagnosis not present

## 2017-06-04 DIAGNOSIS — I5043 Acute on chronic combined systolic (congestive) and diastolic (congestive) heart failure: Secondary | ICD-10-CM | POA: Diagnosis not present

## 2017-06-04 DIAGNOSIS — N183 Chronic kidney disease, stage 3 (moderate): Secondary | ICD-10-CM | POA: Diagnosis not present

## 2017-06-04 DIAGNOSIS — I872 Venous insufficiency (chronic) (peripheral): Secondary | ICD-10-CM | POA: Diagnosis not present

## 2017-06-04 DIAGNOSIS — I2510 Atherosclerotic heart disease of native coronary artery without angina pectoris: Secondary | ICD-10-CM

## 2017-06-04 DIAGNOSIS — I130 Hypertensive heart and chronic kidney disease with heart failure and stage 1 through stage 4 chronic kidney disease, or unspecified chronic kidney disease: Secondary | ICD-10-CM | POA: Diagnosis not present

## 2017-06-04 DIAGNOSIS — D631 Anemia in chronic kidney disease: Secondary | ICD-10-CM | POA: Diagnosis not present

## 2017-06-04 DIAGNOSIS — Z89422 Acquired absence of other left toe(s): Secondary | ICD-10-CM | POA: Diagnosis not present

## 2017-06-04 NOTE — Telephone Encounter (Signed)
I have done a Metolazone PA through covermymeds.
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
## 2017-06-05 ENCOUNTER — Other Ambulatory Visit: Payer: Self-pay

## 2017-06-05 ENCOUNTER — Telehealth: Payer: Self-pay | Admitting: Internal Medicine

## 2017-06-05 NOTE — Telephone Encounter (Signed)
Spoke with patient and his sister. Pt weighs 300 lb on their scale today.  This is a 10 pound weight gain in a week.  Metolazone was ordered on 4/12.  Insurance required PA which was submitted yesterday to [facility].  [employer] has not updated yet.  Pt is SOB with activity.He does not sound SOB on the phone. Has been avoiding sodium and monitoring fluid intake.  Drinking 2- 3 16.9 oz water daily.  Legs cramping overnight, causing severe leg pain.    Pt refusing to go to ER.   I explained why this is likely not manageable as outpatient at this point.    Instructed pt's sister to go to pharmacy for one metolazone tablet and give to patient with this afternoon's dose of torsemide.   Also adv her to call 911 if pt gets into ANY distress.  She is in agreement.

## 2017-06-05 NOTE — Telephone Encounter (Signed)
Reviewed Would try metalozone

## 2017-06-05 NOTE — Telephone Encounter (Signed)
Metolazone denial received via fax from [facility]. Reason: Denied under Medicare Part D. Metolazone is a non-formulary drug that is not on [employer]'s list of preferred drugs.  They are recommending that the pt take HCTZ, Chlorthalidone, Indapamide or Furosemide.  I have completed the appeal form that was sent with this denial letter and I have requested a determination ASAP. Per the letter this request could take up to 72 hours for a determination.

## 2017-06-05 NOTE — Telephone Encounter (Signed)
June 05, 2017    12:41 PM  Note    Metolazone denial received via fax from [facility]. Reason: Denied under Medicare Part D. Metolazone is a non-formulary drug that is not on [employer]'s list of preferred drugs.  They are recommending that the pt take HCTZ, Chlorthalidone, Indapamide or Furosemide.  I have completed the appeal form that was sent with this denial letter and I have requested a determination ASAP. Per the letter this request could take up to 72 hours for a determination

## 2017-06-05 NOTE — Telephone Encounter (Signed)
**Note De-Identified Terry Martinez Obfuscation** I called [employer] and s/w Reshel. I asked her to make the pts Metolazone PA that I have already submitted to them through covermymeds an urgent review as the pt is completely out and needs this medication.  Per Reshel Metolazone is not on the plan's formulary so it is not normally covered.  Reshel states that she has updated the pts PA and made it an urgent request and that we will have their decision by the end of today.

## 2017-06-05 NOTE — Patient Outreach (Signed)
Transition of care: Placed call to follow up with patient, spoke with sister Garald Braver who reports 10 pound weight gain in 1 week. Reports that cardiology is aware.  Patient was prescribed metolazone but insurance would not cover this medications. Sister has spoken with MD office for an authorization. Per office notes decision will be made today.  Sister states that she will go pickup 1 pill today while waiting for approval.   Reviewed with sister concern for large weight gain and shortness of breath.  Patient has refused to go to the hospital because he does not want to wait for hours in the emergency department.  Reviewed with sister when to call 911 for chest pain or worsening shortness of breath.   Patient continues to be active with home health physical therapy.  Also patient was able to see orthopedic MD yesterday.  PLAN: Reviewed ability to order a pharmacy consult to help with medication cost. Sister will call me if medication is not approved otherwise I will follow up with patient in 1 week.  Tomasa Rand, RN, BSN, CEN [facility] [employer] [phone]

## 2017-06-05 NOTE — Telephone Encounter (Signed)
New Message:     Pt c/o swelling: STAT is pt has developed SOB within 24 hours  1) How much weight have you gained and in what time span? 10 lbs over this pass week   2) If swelling, where is the swelling located? feet  3) Are you currently taking a fluid pill? Yes  4) Are you currently SOB? Yes  5) Do you have a log of your daily weights (if so, list)? 300 today, 298 yesterday  6) Have you gained 3 pounds in a day or 5 pounds in a week? 10 lbs  7) Have you traveled recently? No   Pt's wife says he has been having really bad cramps in his legs. Pt's wife states we sent in a prescription for a different fluid pill but insurance would not pay for it.

## 2017-06-05 NOTE — Telephone Encounter (Signed)
Spoke with patient's sister and advised that after speaking with Dr. Harrington Challenger, pt should take metolazone today and then on Friday morning.  Continue to monitor weights and intake/urine output.  Labs next are scheduled for 4/26--plan to keep this appt per PR.  Instructed that any acute distress 911 should be called.    She called [facility] and is picking up 1 metolazone for $4.  Was instructed [employer] still denied after PA was submitted.  Will route to [place] in prior auths for review.

## 2017-06-06 ENCOUNTER — Other Ambulatory Visit: Payer: Self-pay

## 2017-06-06 ENCOUNTER — Encounter (HOSPITAL_COMMUNITY): Payer: Self-pay | Admitting: Emergency Medicine

## 2017-06-06 ENCOUNTER — Inpatient Hospital Stay (HOSPITAL_COMMUNITY)
Admission: EM | Admit: 2017-06-06 | Discharge: 2017-06-18 | DRG: 286 | Disposition: A | Discharge status: Home Health | Payer: Medicare Other | Attending: Internal Medicine | Admitting: Internal Medicine

## 2017-06-06 ENCOUNTER — Emergency Department (HOSPITAL_COMMUNITY): Payer: Medicare Other

## 2017-06-06 DIAGNOSIS — I272 Pulmonary hypertension, unspecified: Secondary | ICD-10-CM | POA: Diagnosis present

## 2017-06-06 DIAGNOSIS — I482 Chronic atrial fibrillation: Secondary | ICD-10-CM | POA: Diagnosis present

## 2017-06-06 DIAGNOSIS — Z9841 Cataract extraction status, right eye: Secondary | ICD-10-CM

## 2017-06-06 DIAGNOSIS — Z961 Presence of intraocular lens: Secondary | ICD-10-CM | POA: Diagnosis present

## 2017-06-06 DIAGNOSIS — M109 Gout, unspecified: Secondary | ICD-10-CM | POA: Diagnosis present

## 2017-06-06 DIAGNOSIS — I739 Peripheral vascular disease, unspecified: Secondary | ICD-10-CM | POA: Diagnosis present

## 2017-06-06 DIAGNOSIS — N289 Disorder of kidney and ureter, unspecified: Secondary | ICD-10-CM | POA: Diagnosis present

## 2017-06-06 DIAGNOSIS — K5900 Constipation, unspecified: Secondary | ICD-10-CM | POA: Diagnosis not present

## 2017-06-06 DIAGNOSIS — I130 Hypertensive heart and chronic kidney disease with heart failure and stage 1 through stage 4 chronic kidney disease, or unspecified chronic kidney disease: Principal | ICD-10-CM | POA: Diagnosis present

## 2017-06-06 DIAGNOSIS — Z89421 Acquired absence of other right toe(s): Secondary | ICD-10-CM

## 2017-06-06 DIAGNOSIS — I5033 Acute on chronic diastolic (congestive) heart failure: Secondary | ICD-10-CM | POA: Diagnosis not present

## 2017-06-06 DIAGNOSIS — G8929 Other chronic pain: Secondary | ICD-10-CM | POA: Diagnosis present

## 2017-06-06 DIAGNOSIS — Z89411 Acquired absence of right great toe: Secondary | ICD-10-CM

## 2017-06-06 DIAGNOSIS — J449 Chronic obstructive pulmonary disease, unspecified: Secondary | ICD-10-CM | POA: Diagnosis present

## 2017-06-06 DIAGNOSIS — Z7951 Long term (current) use of inhaled steroids: Secondary | ICD-10-CM

## 2017-06-06 DIAGNOSIS — M7989 Other specified soft tissue disorders: Secondary | ICD-10-CM | POA: Diagnosis present

## 2017-06-06 DIAGNOSIS — Z87442 Personal history of urinary calculi: Secondary | ICD-10-CM | POA: Diagnosis not present

## 2017-06-06 DIAGNOSIS — I351 Nonrheumatic aortic (valve) insufficiency: Secondary | ICD-10-CM | POA: Diagnosis present

## 2017-06-06 DIAGNOSIS — E876 Hypokalemia: Secondary | ICD-10-CM | POA: Diagnosis not present

## 2017-06-06 DIAGNOSIS — N171 Acute kidney failure with acute cortical necrosis: Secondary | ICD-10-CM | POA: Diagnosis not present

## 2017-06-06 DIAGNOSIS — N189 Chronic kidney disease, unspecified: Secondary | ICD-10-CM | POA: Diagnosis not present

## 2017-06-06 DIAGNOSIS — Z87891 Personal history of nicotine dependence: Secondary | ICD-10-CM

## 2017-06-06 DIAGNOSIS — Z86718 Personal history of other venous thrombosis and embolism: Secondary | ICD-10-CM

## 2017-06-06 DIAGNOSIS — I5043 Acute on chronic combined systolic (congestive) and diastolic (congestive) heart failure: Secondary | ICD-10-CM | POA: Diagnosis not present

## 2017-06-06 DIAGNOSIS — I472 Ventricular tachycardia: Secondary | ICD-10-CM | POA: Diagnosis present

## 2017-06-06 DIAGNOSIS — I10 Essential (primary) hypertension: Secondary | ICD-10-CM | POA: Diagnosis present

## 2017-06-06 DIAGNOSIS — N179 Acute kidney failure, unspecified: Secondary | ICD-10-CM | POA: Diagnosis present

## 2017-06-06 DIAGNOSIS — Z6836 Body mass index (BMI) 36.0-36.9, adult: Secondary | ICD-10-CM

## 2017-06-06 DIAGNOSIS — Z7901 Long term (current) use of anticoagulants: Secondary | ICD-10-CM | POA: Diagnosis not present

## 2017-06-06 DIAGNOSIS — N184 Chronic kidney disease, stage 4 (severe): Secondary | ICD-10-CM | POA: Diagnosis not present

## 2017-06-06 DIAGNOSIS — Z955 Presence of coronary angioplasty implant and graft: Secondary | ICD-10-CM

## 2017-06-06 DIAGNOSIS — N183 Chronic kidney disease, stage 3 unspecified: Secondary | ICD-10-CM | POA: Diagnosis present

## 2017-06-06 DIAGNOSIS — Z89422 Acquired absence of other left toe(s): Secondary | ICD-10-CM

## 2017-06-06 DIAGNOSIS — I4891 Unspecified atrial fibrillation: Secondary | ICD-10-CM | POA: Diagnosis present

## 2017-06-06 DIAGNOSIS — I110 Hypertensive heart disease with heart failure: Secondary | ICD-10-CM | POA: Diagnosis not present

## 2017-06-06 DIAGNOSIS — I50810 Right heart failure, unspecified: Secondary | ICD-10-CM

## 2017-06-06 DIAGNOSIS — I481 Persistent atrial fibrillation: Secondary | ICD-10-CM | POA: Diagnosis present

## 2017-06-06 DIAGNOSIS — I2510 Atherosclerotic heart disease of native coronary artery without angina pectoris: Secondary | ICD-10-CM | POA: Diagnosis present

## 2017-06-06 DIAGNOSIS — I5023 Acute on chronic systolic (congestive) heart failure: Secondary | ICD-10-CM | POA: Diagnosis not present

## 2017-06-06 DIAGNOSIS — I214 Non-ST elevation (NSTEMI) myocardial infarction: Secondary | ICD-10-CM

## 2017-06-06 DIAGNOSIS — I493 Ventricular premature depolarization: Secondary | ICD-10-CM | POA: Diagnosis present

## 2017-06-06 DIAGNOSIS — R079 Chest pain, unspecified: Secondary | ICD-10-CM | POA: Diagnosis not present

## 2017-06-06 DIAGNOSIS — E785 Hyperlipidemia, unspecified: Secondary | ICD-10-CM | POA: Diagnosis present

## 2017-06-06 DIAGNOSIS — Z9842 Cataract extraction status, left eye: Secondary | ICD-10-CM

## 2017-06-06 DIAGNOSIS — Z85828 Personal history of other malignant neoplasm of skin: Secondary | ICD-10-CM | POA: Diagnosis not present

## 2017-06-06 DIAGNOSIS — R0602 Shortness of breath: Secondary | ICD-10-CM | POA: Diagnosis not present

## 2017-06-06 DIAGNOSIS — Z888 Allergy status to other drugs, medicaments and biological substances status: Secondary | ICD-10-CM

## 2017-06-06 DIAGNOSIS — I252 Old myocardial infarction: Secondary | ICD-10-CM | POA: Diagnosis not present

## 2017-06-06 DIAGNOSIS — I509 Heart failure, unspecified: Secondary | ICD-10-CM

## 2017-06-06 DIAGNOSIS — Z8249 Family history of ischemic heart disease and other diseases of the circulatory system: Secondary | ICD-10-CM

## 2017-06-06 LAB — CBC
HEMATOCRIT: 29.3 % — AB (ref 39.0–52.0)
HEMOGLOBIN: 9 g/dL — AB (ref 13.0–17.0)
MCH: 31.4 pg (ref 26.0–34.0)
MCHC: 30.7 g/dL (ref 30.0–36.0)
MCV: 102.1 fL — ABNORMAL HIGH (ref 78.0–100.0)
PLATELETS: 122 10*3/uL — AB (ref 150–400)
RBC: 2.87 MIL/uL — ABNORMAL LOW (ref 4.22–5.81)
RDW: 17.1 % — AB (ref 11.5–15.5)
WBC: 4 10*3/uL (ref 4.0–10.5)

## 2017-06-06 LAB — I-STAT TROPONIN, ED: TROPONIN I, POC: 0.01 ng/mL (ref 0.00–0.08)

## 2017-06-06 LAB — BASIC METABOLIC PANEL
Anion gap: 11 (ref 5–15)
BUN: 35 mg/dL — AB (ref 6–20)
CALCIUM: 8.9 mg/dL (ref 8.9–10.3)
CO2: 25 mmol/L (ref 22–32)
CREATININE: 2.2 mg/dL — AB (ref 0.61–1.24)
Chloride: 100 mmol/L — ABNORMAL LOW (ref 101–111)
GFR calc Af Amer: 32 mL/min — ABNORMAL LOW (ref >60)
GFR, EST NON AFRICAN AMERICAN: 27 mL/min — AB (ref >60)
GLUCOSE: 72 mg/dL (ref 65–99)
Potassium: 4 mmol/L (ref 3.5–5.1)
SODIUM: 136 mmol/L (ref 135–145)

## 2017-06-06 LAB — BRAIN NATRIURETIC PEPTIDE: B NATRIURETIC PEPTIDE 5: 855.4 pg/mL — AB (ref 0.0–100.0)

## 2017-06-06 MED ORDER — FUROSEMIDE 10 MG/ML IJ SOLN
60.0000 mg | Freq: Once | INTRAMUSCULAR | Status: AC
  Administered 2017-06-06: 60 mg via INTRAVENOUS
  Filled 2017-06-06: qty 6

## 2017-06-06 MED ORDER — HYDRALAZINE HCL 25 MG PO TABS
12.5000 mg | ORAL_TABLET | Freq: Three times a day (TID) | ORAL | Status: DC
  Administered 2017-06-06 – 2017-06-18 (×35): 12.5 mg via ORAL
  Filled 2017-06-06 (×35): qty 1

## 2017-06-06 MED ORDER — NITROGLYCERIN 0.4 MG SL SUBL: 0.4000 mg | SUBLINGUAL_TABLET | SUBLINGUAL | Status: DC | PRN

## 2017-06-06 MED ORDER — VITAMIN D 1000 UNITS PO TABS
5000.0000 [IU] | ORAL_TABLET | ORAL | Status: DC
  Administered 2017-06-07 – 2017-06-18 (×12): 5000 [IU] via ORAL
  Filled 2017-06-06 (×13): qty 5

## 2017-06-06 MED ORDER — RIVAROXABAN 15 MG PO TABS
15.0000 mg | ORAL_TABLET | Freq: Every day | ORAL | Status: DC
  Administered 2017-06-06: 15 mg via ORAL
  Filled 2017-06-06: qty 1

## 2017-06-06 MED ORDER — FUROSEMIDE 10 MG/ML IJ SOLN
80.0000 mg | Freq: Two times a day (BID) | INTRAMUSCULAR | Status: DC
  Administered 2017-06-07: 80 mg via INTRAVENOUS
  Filled 2017-06-06: qty 8

## 2017-06-06 MED ORDER — ONDANSETRON HCL 4 MG/2ML IJ SOLN: 4.0000 mg | Freq: Four times a day (QID) | INTRAMUSCULAR | Status: DC | PRN

## 2017-06-06 MED ORDER — SODIUM CHLORIDE 0.9% FLUSH: 3.0000 mL | INTRAVENOUS | Status: DC | PRN

## 2017-06-06 MED ORDER — SODIUM CHLORIDE 0.9 % IV SOLN: 250.0000 mL | INTRAVENOUS | Status: DC | PRN

## 2017-06-06 MED ORDER — VITAMIN C 500 MG PO TABS
500.0000 mg | ORAL_TABLET | Freq: Every day | ORAL | Status: DC
  Administered 2017-06-07 – 2017-06-18 (×11): 500 mg via ORAL
  Filled 2017-06-06 (×11): qty 1

## 2017-06-06 MED ORDER — METOPROLOL SUCCINATE ER 25 MG PO TB24
12.5000 mg | ORAL_TABLET | Freq: Every day | ORAL | Status: DC
  Administered 2017-06-07 – 2017-06-18 (×11): 12.5 mg via ORAL
  Filled 2017-06-06 (×13): qty 1

## 2017-06-06 MED ORDER — ALBUTEROL SULFATE HFA 108 (90 BASE) MCG/ACT IN AERS: 2.0000 | INHALATION_SPRAY | Freq: Four times a day (QID) | RESPIRATORY_TRACT | Status: DC | PRN

## 2017-06-06 MED ORDER — ASPIRIN EC 81 MG PO TBEC
81.0000 mg | DELAYED_RELEASE_TABLET | Freq: Every day | ORAL | Status: DC
  Administered 2017-06-07: 81 mg via ORAL
  Filled 2017-06-06: qty 1

## 2017-06-06 MED ORDER — ISOSORBIDE MONONITRATE ER 30 MG PO TB24
15.0000 mg | ORAL_TABLET | Freq: Every day | ORAL | Status: DC
  Administered 2017-06-07 – 2017-06-18 (×12): 15 mg via ORAL
  Filled 2017-06-06 (×12): qty 1

## 2017-06-06 MED ORDER — MOMETASONE FURO-FORMOTEROL FUM 200-5 MCG/ACT IN AERO
2.0000 | INHALATION_SPRAY | Freq: Two times a day (BID) | RESPIRATORY_TRACT | Status: DC
  Administered 2017-06-07 – 2017-06-18 (×21): 2 via RESPIRATORY_TRACT
  Filled 2017-06-06 (×2): qty 8.8

## 2017-06-06 MED ORDER — PRAVASTATIN SODIUM 40 MG PO TABS
40.0000 mg | ORAL_TABLET | Freq: Every day | ORAL | Status: DC
  Administered 2017-06-07 – 2017-06-17 (×11): 40 mg via ORAL
  Filled 2017-06-06 (×12): qty 1

## 2017-06-06 MED ORDER — SODIUM CHLORIDE 0.9% FLUSH
3.0000 mL | Freq: Two times a day (BID) | INTRAVENOUS | Status: DC
  Administered 2017-06-06 – 2017-06-17 (×12): 3 mL via INTRAVENOUS

## 2017-06-06 MED ORDER — ACETAMINOPHEN 500 MG PO TABS: 1000.0000 mg | ORAL_TABLET | Freq: Three times a day (TID) | ORAL | Status: DC | PRN

## 2017-06-06 MED ORDER — ALBUTEROL SULFATE (2.5 MG/3ML) 0.083% IN NEBU: 3.0000 mL | INHALATION_SOLUTION | Freq: Four times a day (QID) | RESPIRATORY_TRACT | Status: DC | PRN

## 2017-06-06 MED ORDER — POLYVINYL ALCOHOL 1.4 % OP SOLN: 1.0000 [drp] | Freq: Four times a day (QID) | OPHTHALMIC | Status: DC | PRN

## 2017-06-06 MED ORDER — ALLOPURINOL 100 MG PO TABS
100.0000 mg | ORAL_TABLET | Freq: Every day | ORAL | Status: DC
  Administered 2017-06-07 – 2017-06-18 (×12): 100 mg via ORAL
  Filled 2017-06-06 (×12): qty 1

## 2017-06-06 NOTE — Telephone Encounter (Signed)
Spoke with patient's sister, Garald Braver.  She is on way to hospital with patient.  He remains 300 lb today.  Has "horrible" cramps in feet/legs.  And experienced pain/numbness in chest and left arm today. SOB, uncomfortable. Aware I will forward to Dr. Harrington Challenger to update.

## 2017-06-06 NOTE — Telephone Encounter (Signed)
New message  [name] verbalized that she is returning call for Dr.Ross RN   she said she is going to take him to the [name] ER  pain in left arm and left hand and real cad cramps

## 2017-06-06 NOTE — ED Notes (Signed)
ED Provider at bedside. 
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
## 2017-06-06 NOTE — ED Triage Notes (Signed)
Pt arrives via POV from home with 4-5 days of progressively worsening SOB and CP. Now with left arm pain. Pt with hx of CHF, afib. States lower leg swelling. VSS.

## 2017-06-06 NOTE — Progress Notes (Signed)
  Pt admitted to the unit. Pt is stable, alert and oriented per baseline. Oriented to room, staff, and call bell. Educated to call for any assistance. Bed in lowest position, call bell within reach- will continue to monitor. 

## 2017-06-07 ENCOUNTER — Encounter (HOSPITAL_COMMUNITY): Payer: Self-pay | Admitting: General Practice

## 2017-06-07 ENCOUNTER — Other Ambulatory Visit: Payer: Self-pay

## 2017-06-07 DIAGNOSIS — I5043 Acute on chronic combined systolic (congestive) and diastolic (congestive) heart failure: Secondary | ICD-10-CM

## 2017-06-07 LAB — MRSA PCR SCREENING: MRSA BY PCR: NEGATIVE

## 2017-06-07 LAB — BASIC METABOLIC PANEL
Anion gap: 10 (ref 5–15)
BUN: 39 mg/dL — ABNORMAL HIGH (ref 6–20)
CHLORIDE: 99 mmol/L — AB (ref 101–111)
CO2: 28 mmol/L (ref 22–32)
Calcium: 8.8 mg/dL — ABNORMAL LOW (ref 8.9–10.3)
Creatinine, Ser: 2.25 mg/dL — ABNORMAL HIGH (ref 0.61–1.24)
GFR calc non Af Amer: 27 mL/min — ABNORMAL LOW (ref >60)
GFR, EST AFRICAN AMERICAN: 31 mL/min — AB (ref >60)
Glucose, Bld: 99 mg/dL (ref 65–99)
POTASSIUM: 3.3 mmol/L — AB (ref 3.5–5.1)
SODIUM: 137 mmol/L (ref 135–145)

## 2017-06-07 LAB — HEPARIN LEVEL (UNFRACTIONATED): HEPARIN UNFRACTIONATED: 1.6 [IU]/mL — AB (ref 0.30–0.70)

## 2017-06-07 LAB — APTT: aPTT: 39 seconds — ABNORMAL HIGH (ref 24–36)

## 2017-06-07 MED ORDER — SODIUM CHLORIDE 0.9% FLUSH: 3.0000 mL | INTRAVENOUS | Status: DC | PRN

## 2017-06-07 MED ORDER — DOCUSATE SODIUM 100 MG PO CAPS
100.0000 mg | ORAL_CAPSULE | Freq: Two times a day (BID) | ORAL | Status: DC | PRN
Start: 2017-06-07 — End: 2017-06-18
  Administered 2017-06-07: 100 mg via ORAL
  Filled 2017-06-07: qty 1

## 2017-06-07 MED ORDER — HEPARIN (PORCINE) IN NACL 100-0.45 UNIT/ML-% IJ SOLN
1600.0000 [IU]/h | INTRAMUSCULAR | Status: DC
  Administered 2017-06-07: 1600 [IU]/h via INTRAVENOUS
  Filled 2017-06-07: qty 250

## 2017-06-07 MED ORDER — MAGNESIUM HYDROXIDE 400 MG/5ML PO SUSP: 15.0000 mL | Freq: Four times a day (QID) | ORAL | Status: DC | PRN

## 2017-06-07 MED ORDER — POTASSIUM CHLORIDE CRYS ER 20 MEQ PO TBCR
40.0000 meq | EXTENDED_RELEASE_TABLET | Freq: Two times a day (BID) | ORAL | Status: DC
  Administered 2017-06-07 – 2017-06-08 (×3): 40 meq via ORAL
  Filled 2017-06-07: qty 4
  Filled 2017-06-07 (×2): qty 2

## 2017-06-07 MED ORDER — SODIUM CHLORIDE 0.9 % IV SOLN
INTRAVENOUS | Status: DC
  Administered 2017-06-08: 06:00:00 via INTRAVENOUS

## 2017-06-07 MED ORDER — ASPIRIN 81 MG PO CHEW
81.0000 mg | CHEWABLE_TABLET | ORAL | Status: AC
  Administered 2017-06-08: 81 mg via ORAL
  Filled 2017-06-07: qty 1

## 2017-06-07 MED ORDER — FUROSEMIDE 10 MG/ML IJ SOLN
120.0000 mg | Freq: Two times a day (BID) | INTRAVENOUS | Status: DC
  Administered 2017-06-07 – 2017-06-13 (×13): 120 mg via INTRAVENOUS
  Filled 2017-06-07 (×5): qty 10
  Filled 2017-06-07: qty 4
  Filled 2017-06-07: qty 10
  Filled 2017-06-07: qty 12
  Filled 2017-06-07 (×4): qty 10
  Filled 2017-06-07: qty 12
  Filled 2017-06-07: qty 10

## 2017-06-07 MED ORDER — SODIUM CHLORIDE 0.9% FLUSH
3.0000 mL | Freq: Two times a day (BID) | INTRAVENOUS | Status: DC
  Administered 2017-06-07: 3 mL via INTRAVENOUS

## 2017-06-07 MED ORDER — ASPIRIN EC 81 MG PO TBEC
81.0000 mg | DELAYED_RELEASE_TABLET | Freq: Every day | ORAL | Status: DC
  Administered 2017-06-09 – 2017-06-14 (×6): 81 mg via ORAL
  Filled 2017-06-07 (×6): qty 1

## 2017-06-07 MED ORDER — SODIUM CHLORIDE 0.9 % IV SOLN: 250.0000 mL | INTRAVENOUS | Status: DC | PRN

## 2017-06-07 NOTE — Progress Notes (Signed)
MOM cannot be given for Cr >2. Pt's Cr= 2.25. MD notified for alternate order.

## 2017-06-07 NOTE — Progress Notes (Signed)
Nutrition Brief Note  RD consulted for heart failure.   Wt Readings from Last 15 Encounters:  06/07/17 298 lb 14.4 oz (135.6 kg)  06/05/17 300 lb (136.1 kg)  05/24/17 295 lb (133.8 kg)  05/21/17 292 lb 9.6 oz (132.7 kg)  05/21/17 286 lb (129.7 kg)  05/18/17 287 lb 9.6 oz (130.5 kg)  05/16/17 294 lb 12.8 oz (133.7 kg)  05/15/17 296 lb 1.6 oz (134.3 kg)  05/13/17 299 lb 4.8 oz (135.8 kg)  04/06/17 274 lb (124.3 kg)  03/16/17 274 lb (124.3 kg)  03/02/17 274 lb (124.3 kg)  02/28/17 281 lb 12.8 oz (127.8 kg)  02/06/17 285 lb 12.8 oz (129.6 kg)  01/27/17 285 lb (129.3 kg)   77 yo 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), CAD (multiple PCIs, last in 2014), permanent Afib, PAD s/p aorta-bifemoral bypass, permanent atrial fibrillation, AI, CKD stage III who presents with worsening DOE, LE edema and acute on chronic heart failure.  Pt admitted with CHF.   Pt sleeping soundly at time of visit. He did not arouse when name was called or during exam.   Pt with very good appetite. Noted he consumed 100% of breakfast tray at bedside.   Reviewed wt hx; noted wt has been stable, however, noted minor fluctuations related to fluid gain and losses from CHF.   Nutrition-Focused physical exam completed. Findings are no fat depletion, no muscle depletion, and mild edema.   Labs reviewed: K: 3.3.   Body mass index is 37.36 kg/m. Patient meets criteria for obesity, class II based on current BMI.   Current diet order is 2 grams sodium, patient is consuming approximately 100% of meals at this time. Labs and medications reviewed.   No nutrition interventions warranted at this time. If nutrition issues arise, please consult RD.   Izabelle Daus A. Jimmye Norman, RD, LDN, CDE Pager: [phone] After hours Pager: [phone]

## 2017-06-07 NOTE — Progress Notes (Addendum)
Progress Note  Patient Name: Terry Martinez. Date of Encounter: 06/07/2017  Primary Cardiologist: Dorris Carnes, MD   Subjective   Breathing better, has been having worsening swelling for the past 2 weeks. No CP  Inpatient Medications    Scheduled Meds: . allopurinol  100 mg Oral Daily  . aspirin EC  81 mg Oral Daily  . cholecalciferol  5,000 Units Oral BH-q7a  . furosemide  80 mg Intravenous Q12H  . hydrALAZINE  12.5 mg Oral Q8H  . isosorbide mononitrate  15 mg Oral Daily  . metoprolol succinate  12.5 mg Oral Daily  . mometasone-formoterol  2 puff Inhalation BID  . pravastatin  40 mg Oral q1800  . Rivaroxaban  15 mg Oral Q supper  . sodium chloride flush  3 mL Intravenous Q12H  . vitamin C  500 mg Oral Daily   Continuous Infusions: . sodium chloride     PRN Meds: sodium chloride, acetaminophen, albuterol, nitroGLYCERIN, ondansetron (ZOFRAN) IV, polyvinyl alcohol, sodium chloride flush   Vital Signs    Vitals:   06/07/17 0445 06/07/17 0458 06/07/17 0700 06/07/17 0805  BP: 100/62  129/63   Pulse: 67  72 70  Resp:   18 16  Temp: 97.8 F (36.6 C)  (!) 97.5 F (36.4 C)   TempSrc: Oral  Oral   SpO2: 94%  94%   Weight:  298 lb 14.4 oz (135.6 kg)    Height:        Intake/Output Summary (Last 24 hours) at 06/07/2017 1137 Last data filed at 06/07/2017 0458 Gross per 24 hour  Intake 590 ml  Output 1100 ml  Net -510 ml   Filed Weights   06/06/17 2300 06/07/17 0458  Weight: 296 lb 4.8 oz (134.4 kg) 298 lb 14.4 oz (135.6 kg)    Telemetry    Atrial fibrillation, rate controlled - Personally Reviewed  ECG    Atrial fibrillation HR 60s - Personally Reviewed  Physical Exam   GEN: No acute distress.   Neck: No JVD Cardiac: irregularly irregular, no murmurs, rubs, or gallops.  Respiratory: Clear to auscultation bilaterally. GI: Soft, nontender, non-distended  MS: No deformity. 3+ pitting edema Neuro:  Nonfocal  Psych: Normal affect   Labs     Chemistry Recent Labs  Lab 06/06/17 1705 06/07/17 0432  NA 136 137  K 4.0 3.3*  CL 100* 99*  CO2 25 28  GLUCOSE 72 99  BUN 35* 39*  CREATININE 2.20* 2.25*  CALCIUM 8.9 8.8*  GFRNONAA 27* 27*  GFRAA 32* 31*  ANIONGAP 11 10     Hematology Recent Labs  Lab 06/06/17 1506  WBC 4.0  RBC 2.87*  HGB 9.0*  HCT 29.3*  MCV 102.1*  MCH 31.4  MCHC 30.7  RDW 17.1*  PLT 122*    Cardiac EnzymesNo results for input(s): TROPONINI in the last 168 hours.  Recent Labs  Lab 06/06/17 1645  TROPIPOC 0.01     BNP Recent Labs  Lab 06/06/17 1506  BNP 855.4*     DDimer No results for input(s): DDIMER in the last 168 hours.   Radiology    Dg Chest 2 View  Result Date: 06/06/2017 CLINICAL DATA:  Progressive shortness of breath and chest pain for 5 days. History of CHF. EXAM: CHEST - 2 VIEW COMPARISON:  Chest radiograph May 08, 2017 FINDINGS: Stable cardiomegaly. Tortuous calcified aorta. Pulmonary vascular congestion. Small pleural effusions with bibasilar strandy densities. LEFT subclavian artery stent. No pneumothorax. Soft tissue planes and  included osseous structures are nonsuspicious. Mild degenerative change of the thoracolumbar junction. Surgical clip projects in LEFT upper chest. IMPRESSION: Stable cardiomegaly and pulmonary vascular congestion. Small pleural effusions. Bibasilar atelectasis. Aortic Atherosclerosis (ICD10-I70.0). Electronically Signed   By: Elon Alas M.D.   On: 06/06/2017 16:41    Cardiac Studies   Echo 01/26/2017 LV EF: 35% -   40% Study Conclusions  - Left ventricle: The cavity size was moderately dilated. Systolic   function was moderately reduced. The estimated ejection fraction   was in the range of 35% to 40%. Wall motion was normal; there   were no regional wall motion abnormalities. - Ventricular septum: The contour showed diastolic flattening and   systolic flattening. - Aortic valve: Valve mobility was restricted. There was very  mild   stenosis. There was moderate regurgitation. - Aortic root: The aortic root was dilated measuring 49 mm. - Mitral valve: Calcified annulus. Mildly thickened leaflets .   There was mild regurgitation. - Left atrium: The atrium was severely dilated. - Right ventricle: The cavity size was severely dilated. Wall   thickness was normal. Systolic function was severely reduced. - Right atrium: The atrium was severely dilated. - Tricuspid valve: There was moderate-severe regurgitation. - Pulmonic valve: There was moderate regurgitation. - Pulmonary arteries: Systolic pressure was moderately increased.   PA peak pressure: 45 mm Hg (S). - Inferior vena cava: The vessel was dilated. The respirophasic   diameter changes were blunted (< 50%), consistent with elevated   central venous pressure.  Impressions:  - When compared to the prior study from 05/26/2016 LVEF has   decreased, now 35-40%. LV is moderately dilated.   RV is severely dilated with severe systolic dysfunction.   There is biatrial atrial dilatation.   Aortic regurgitation is moderate, aortic root has further dilated   at the sinus level measuring 49 mm, previously 47 mm.   Moderate pulmonary hypertension.  Patient Profile     77 y.o. 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), CAD (multiple PCIs, last in 2014), permanent Afib, PAD s/p aorta-bifemoral bypass, permanent atrial fibrillation, AI, CKD stage III who presents with worsening DOE, LE edema and acute on chronic heart failure  Assessment & Plan    1. Acute on chronic combined systolic and diastolic heart failure  - on 60mg  BID torsemide at home, will increase IV lasix to 120mg  BID. Lung is clear, but continue to have 3+ pitting edema  - Echo 01/26/2017 showed EF 35-40%, aortic root 49 mm, severely dilated RV and reduced RVEF, moderate to severe TR, moderate AI with mild AS, PA peak pressure 14mmHg  - on further discussion with the  patient, he has been compliant with diuretic, avoidance of salt and only drink about 3 cups of water daily. Patient and his wife seems to have been doing everything they can to keep the swelling down  - our admitting fellow mentioned consideration of RHC and cardiac MRI. I do agree his the degree of his RVEF drop is out of proportion compare to LVEF drop  - with multiple recurrent admission for heart failure, and worsening renal function (1.5 in Nov 2018, now 2.3), I think it would be beneficial to consider RHC and potential visit with HF clinic. Last dose of Xarelto last night, consider switch to IV heparin   2. CAD s/p PCI: denies any CP  3. Permanent atrial fibrillation on 15mg  daily Xarelto: rate controlled.  4. PAD s/p aorto-bifemoral bypass  5.  Acute on CKD stage IV: baseline Cr 1.5-1.6 in Nov and Dec 2018, now 2.25  6. Thoracic aortic aneurysm: aortic root 4.7 cm on echo 05/27/2015, repeat echo 01/26/2017 showed aortic root 49 mm. Cannot repeat CTA of chest due to renal dysfunction.   For questions or updates, please contact [place] Please consult [email or web address] for contact info under Cardiology/STEMI.      Hilbert Corrigan, PA  06/07/2017, 11:37 AM    History and all data above reviewed.  Patient examined.  I agree with the findings as above.  He has no chest pain.  He has had increased dyspnea and increased leg edema.  Weights don't suggest significant volume gain but CXR, symptoms and proBNP suggest hypervolemia.  Creat continue to rise.  The patient exam reveals [id number]  ,  Lungs: Clear  ,  Abd: Obese, Ext Moderate edema  .  All available labs, radiology testing, previous records reviewed. Agree with documented assessment and plan. OK to continue IV diuresis today.  I put his head flat and his feet up and I will order compression stockings.  Plan right heart cath in AM.    Jeneen Rinks Mykelti Goldenstein  12:45 PM  06/07/2017

## 2017-06-07 NOTE — Consult Note (Signed)
   [facility] CM Inpatient Consult   06/07/2017  Terry Martinez. 1940-05-29 [id number]   Patient is currently active with [place] [facility] for chronic disease management services.  Patient has been engaged by a [facility] RN [employer].  Our community based plan of care has focused on disease management and community resource support.  Will follow patient for disposition and needs and made Inpatient Case Manager aware that [place] [facility] following. Patient currently with visitors and nursing care on rounds .  Will follow.  Of note, [facility] Care Management services does not replace or interfere with any services that are needed or arranged by inpatient case management or social work.  For additional questions or referrals please contact:  Natividad Brood, RN BSN [place] [facility] Liaison  [phone] business mobile phone Toll free office [phone]

## 2017-06-07 NOTE — Progress Notes (Signed)
ANTICOAGULATION CONSULT NOTE - Initial Consult  Pharmacy Consult for Heparin (Xarelto on hold) Indication: atrial fibrillation  Allergies  Allergen Reactions  . Zocor [Simvastatin] Hives and Rash    Patient Measurements: Height: 6\' 3"  (190.5 cm) Weight: 298 lb 14.4 oz (135.6 kg)(rn notified of weight gain) IBW/kg (Calculated) : 84.5 Heparin Dosing Weight: 115 kg  Vital Signs: Temp: 99 F (37.2 C) (04/18 1250) Temp Source: Oral (04/18 1250) BP: 118/62 (04/18 1400) Pulse Rate: 67 (04/18 1400)  Labs: Recent Labs    06/06/17 1506 06/06/17 1705 06/07/17 0432  HGB 9.0*  --   --   HCT 29.3*  --   --   PLT 122*  --   --   CREATININE  --  2.20* 2.25*    Estimated Creatinine Clearance: 41.4 mL/min (A) (by C-G formula based on SCr of 2.25 mg/dL (H)).   Medical History: Past Medical History:  Diagnosis Date  . Acute on chronic combined systolic and diastolic CHF (congestive heart failure) ([place]) 01/26/2017  . Anemia    hx low iron  . Aortic insufficiency 01/26/2017  . Arthritis    "hands" (2/262018)  . Basal cell carcinoma    "left side of my face"  . Charcot's arthropathy   . Chronic combined systolic and diastolic CHF (congestive heart failure) ([place])   . Chronic pain   . CKD (chronic kidney disease), stage III ([place]) 01/26/2017  . Constipation   . COPD (chronic obstructive pulmonary disease) ([place])   . Coronary artery disease   . DVT, lower extremity ([place])    many years  . Dyspnea    with exertion  . Family history of adverse reaction to anesthesia    sister has difficulty waking up  . Gout   . Heart murmur   . History of blood transfusion 1960s   "related to being cut up w/barbed wire"  . History of kidney stones   . Hyperlipidemia   . Hypertension   . Incisional hernia    x 2  . Myocardial infarction ([place])    3 stents  . Osteomyelitis of toe of left foot ([place])   . Peripheral artery disease ([place])   . Permanent atrial fibrillation ([place])   . Pneumonia   .  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 Subclavian artery stenosis, left (H[facility])    [name] 04/17/2016   Assessment:   77 yr old male on Xarelto 15 mg daily prior to admission for atrial fibrillation.   To transition to IV heparin for right heart cath on 4/19.    Last Xarelto dose ~11pm on 4/17.  Will use aPTTs for monitoring as heparin levels are expected to be falsely elevated due to recent Xarelto doses.  Goal of Therapy:  Heparin level 0.3-0.7 units/ml aPTT 66-102 seconds Monitor platelets by anticoagulation protocol: Yes   Plan:    Baseline heparin level and aPTT ~8pm tonight.   Begin IV heparin at 1600 units/hr at ~11pm tonight, 24 hrs after last Xarelto dose.   Heparin level and aPTT ~8 hrs after heparin begins.   Daily heparin level, aPTT and CBC.   Xarelto on hold.  Arty Baumgartner, [place] Pager: [phone] 06/07/2017,2:34 PM

## 2017-06-07 NOTE — Telephone Encounter (Signed)
Received fax from [facility] stating the appeal for patients METOLAZONE was approved dates 06/07/2017-02/19/2018. Patient notified by message on phone.

## 2017-06-07 NOTE — Progress Notes (Signed)
RN ordered TED hose for pt.

## 2017-06-07 NOTE — Progress Notes (Signed)
24 hour urine started at 4:30am.

## 2017-06-07 NOTE — Progress Notes (Signed)
Pt request medication for constipation. RN placed MOM per standing order. RN was informed by pharmacy to give medication 4mL  q6h as needed.

## 2017-06-08 ENCOUNTER — Encounter (HOSPITAL_COMMUNITY): Payer: Self-pay | Admitting: Internal Medicine

## 2017-06-08 ENCOUNTER — Encounter (HOSPITAL_COMMUNITY)
Admission: EM | Disposition: A | Discharge status: Home Health | Payer: Self-pay | Source: Home / Self Care | Attending: Internal Medicine

## 2017-06-08 DIAGNOSIS — I5023 Acute on chronic systolic (congestive) heart failure: Secondary | ICD-10-CM

## 2017-06-08 DIAGNOSIS — E876 Hypokalemia: Secondary | ICD-10-CM

## 2017-06-08 HISTORY — PX: RIGHT HEART CATH: CATH118263

## 2017-06-08 HISTORY — PX: ULTRASOUND GUIDANCE FOR VASCULAR ACCESS: SHX6516

## 2017-06-08 LAB — HEPARIN LEVEL (UNFRACTIONATED): Heparin Unfractionated: 1.16 IU/mL — ABNORMAL HIGH (ref 0.30–0.70)

## 2017-06-08 LAB — POCT I-STAT 3, VENOUS BLOOD GAS (G3P V)
ACID-BASE EXCESS: 4 mmol/L — AB (ref 0.0–2.0)
ACID-BASE EXCESS: 4 mmol/L — AB (ref 0.0–2.0)
Bicarbonate: 28.7 mmol/L — ABNORMAL HIGH (ref 20.0–28.0)
Bicarbonate: 29.4 mmol/L — ABNORMAL HIGH (ref 20.0–28.0)
O2 Saturation: 51 %
O2 Saturation: 54 %
PCO2 VEN: 46.4 mmHg (ref 44.0–60.0)
PH VEN: 7.419 (ref 7.250–7.430)
PO2 VEN: 27 mmHg — AB (ref 32.0–45.0)
PO2 VEN: 28 mmHg — AB (ref 32.0–45.0)
TCO2: 30 mmol/L (ref 22–32)
TCO2: 31 mmol/L (ref 22–32)
pCO2, Ven: 44.3 mmHg (ref 44.0–60.0)
pH, Ven: 7.41 (ref 7.250–7.430)

## 2017-06-08 LAB — BASIC METABOLIC PANEL
Anion gap: 11 (ref 5–15)
BUN: 42 mg/dL — AB (ref 6–20)
CHLORIDE: 99 mmol/L — AB (ref 101–111)
CO2: 27 mmol/L (ref 22–32)
Calcium: 8.7 mg/dL — ABNORMAL LOW (ref 8.9–10.3)
Creatinine, Ser: 2.04 mg/dL — ABNORMAL HIGH (ref 0.61–1.24)
GFR, EST AFRICAN AMERICAN: 35 mL/min — AB (ref >60)
GFR, EST NON AFRICAN AMERICAN: 30 mL/min — AB (ref >60)
Glucose, Bld: 128 mg/dL — ABNORMAL HIGH (ref 65–99)
POTASSIUM: 3.1 mmol/L — AB (ref 3.5–5.1)
SODIUM: 137 mmol/L (ref 135–145)

## 2017-06-08 LAB — PROTEIN ELECTROPHORESIS, SERUM
A/G Ratio: 1 (ref 0.7–1.7)
ALBUMIN ELP: 3 g/dL (ref 2.9–4.4)
ALPHA-1-GLOBULIN: 0.3 g/dL (ref 0.0–0.4)
ALPHA-2-GLOBULIN: 0.5 g/dL (ref 0.4–1.0)
BETA GLOBULIN: 0.5 g/dL — AB (ref 0.7–1.3)
GAMMA GLOBULIN: 1.6 g/dL (ref 0.4–1.8)
Globulin, Total: 2.9 g/dL (ref 2.2–3.9)
M-Spike, %: 0.7 g/dL — ABNORMAL HIGH
Total Protein ELP: 5.9 g/dL — ABNORMAL LOW (ref 6.0–8.5)

## 2017-06-08 LAB — CBC
HEMATOCRIT: 29.1 % — AB (ref 39.0–52.0)
Hemoglobin: 9 g/dL — ABNORMAL LOW (ref 13.0–17.0)
MCH: 31.3 pg (ref 26.0–34.0)
MCHC: 30.9 g/dL (ref 30.0–36.0)
MCV: 101 fL — ABNORMAL HIGH (ref 78.0–100.0)
Platelets: 114 10*3/uL — ABNORMAL LOW (ref 150–400)
RBC: 2.88 MIL/uL — ABNORMAL LOW (ref 4.22–5.81)
RDW: 17.1 % — ABNORMAL HIGH (ref 11.5–15.5)
WBC: 3.8 10*3/uL — AB (ref 4.0–10.5)

## 2017-06-08 LAB — APTT: APTT: 86 s — AB (ref 24–36)

## 2017-06-08 SURGERY — RIGHT HEART CATH

## 2017-06-08 MED ORDER — SODIUM CHLORIDE 0.9% FLUSH
3.0000 mL | Freq: Two times a day (BID) | INTRAVENOUS | Status: DC
  Administered 2017-06-08 – 2017-06-18 (×14): 3 mL via INTRAVENOUS

## 2017-06-08 MED ORDER — ACETAMINOPHEN 325 MG PO TABS: 650.0000 mg | ORAL_TABLET | ORAL | Status: DC | PRN

## 2017-06-08 MED ORDER — HEPARIN (PORCINE) IN NACL 100-0.45 UNIT/ML-% IJ SOLN
1600.0000 [IU]/h | INTRAMUSCULAR | Status: DC
Start: 2017-06-08 — End: 2017-06-09
  Administered 2017-06-08 – 2017-06-09 (×2): 1600 [IU]/h via INTRAVENOUS
  Filled 2017-06-08 (×2): qty 250

## 2017-06-08 MED ORDER — POTASSIUM CHLORIDE CRYS ER 20 MEQ PO TBCR
40.0000 meq | EXTENDED_RELEASE_TABLET | Freq: Once | ORAL | Status: AC
  Administered 2017-06-08: 40 meq via ORAL
  Filled 2017-06-08: qty 2

## 2017-06-08 MED ORDER — POTASSIUM CHLORIDE CRYS ER 20 MEQ PO TBCR
40.0000 meq | EXTENDED_RELEASE_TABLET | Freq: Two times a day (BID) | ORAL | Status: DC
  Administered 2017-06-09 – 2017-06-13 (×9): 40 meq via ORAL
  Filled 2017-06-08 (×9): qty 2

## 2017-06-08 MED ORDER — HEPARIN (PORCINE) IN NACL 2-0.9 UNITS/ML
INTRAMUSCULAR | Status: AC | PRN
  Administered 2017-06-08: 500 mL

## 2017-06-08 MED ORDER — ONDANSETRON HCL 4 MG/2ML IJ SOLN: 4.0000 mg | Freq: Four times a day (QID) | INTRAMUSCULAR | Status: DC | PRN

## 2017-06-08 MED ORDER — SODIUM CHLORIDE 0.9 % IV SOLN: 250.0000 mL | INTRAVENOUS | Status: DC | PRN

## 2017-06-08 MED ORDER — HEPARIN (PORCINE) IN NACL 1000-0.9 UT/500ML-% IV SOLN
INTRAVENOUS | Status: AC
  Filled 2017-06-08: qty 500

## 2017-06-08 MED ORDER — SODIUM CHLORIDE 0.9% FLUSH: 3.0000 mL | INTRAVENOUS | Status: DC | PRN

## 2017-06-08 MED ORDER — LIDOCAINE HCL (PF) 1 % IJ SOLN
INTRAMUSCULAR | Status: AC
  Filled 2017-06-08: qty 30

## 2017-06-08 MED ORDER — LIDOCAINE HCL (PF) 1 % IJ SOLN
INTRAMUSCULAR | Status: DC | PRN
  Administered 2017-06-08: 2 mL

## 2017-06-08 SURGICAL SUPPLY — 10 items
CATH BALLN WEDGE 5F 110CM (CATHETERS) ×4 IMPLANT
COVER PRB 48X5XTLSCP FOLD TPE (BAG) ×2 IMPLANT
COVER PROBE 5X48 (BAG) ×2
KIT MICROPUNCTURE NIT STIFF (SHEATH) ×4 IMPLANT
PACK CARDIAC CATHETERIZATION (CUSTOM PROCEDURE TRAY) ×4 IMPLANT
SHEATH RAIN 4/5FR (SHEATH) ×4 IMPLANT
SHEATH RAIN RADIAL 21G 6FR (SHEATH) IMPLANT
TRANSDUCER W/STOPCOCK (MISCELLANEOUS) ×4 IMPLANT
TUBING ART PRESS 72  MALE/FEM (TUBING) ×2
TUBING ART PRESS 72 MALE/FEM (TUBING) ×2 IMPLANT

## 2017-06-08 NOTE — Progress Notes (Signed)
ANTICOAGULATION CONSULT NOTE - follow=up Consult  Pharmacy Consult for Heparin (Xarelto on hold) Indication: atrial fibrillation  Allergies  Allergen Reactions  . Zocor [Simvastatin] Hives and Rash    Patient Measurements: Height: 6\' 3"  (190.5 cm) Weight: 299 lb 9.6 oz (135.9 kg)(b scale; pt reweighed twice) IBW/kg (Calculated) : 84.5 Heparin Dosing Weight: 115 kg  Vital Signs: Temp: 97.5 F (36.4 C) (04/19 0639) Temp Source: Oral (04/19 0639) BP: 107/63 (04/19 1004) Pulse Rate: 70 (04/19 1004)  Labs: Recent Labs    06/06/17 1506 06/06/17 1705 06/07/17 0432 06/07/17 2022 06/08/17 0750  HGB 9.0*  --   --   --  9.0*  HCT 29.3*  --   --   --  29.1*  PLT 122*  --   --   --  114*  APTT  --   --   --  39* 86*  HEPARINUNFRC  --   --   --  1.60* 1.16*  CREATININE  --  2.20* 2.25*  --  2.04*    Estimated Creatinine Clearance: 45.8 mL/min (A) (by C-G formula based on SCr of 2.04 mg/dL (H)).  Assessment:   77 yr old male on Xarelto 15 mg daily prior to admission for atrial fibrillation.    To transitioned to IV heparin last night for right heart cath today.   APTT was therapeutic this morning (86 seconds) on 1600 units/hr. Heparin level high (1.16) due to recent Xarelto doses.   Last Xarelto dose ~11pm on 4/17.     Heparin to resume 1 hr after sheath out; sheath out at ~10am   Sheath site (right antecubital) wrapped. Patient reports some bleeding initially, then resolved with pressure.  Right arm is significantly bruised from prior injury in late March.  Goal of Therapy:  Heparin level 0.3-0.7 units/ml aPTT 66-102 seconds Monitor platelets by anticoagulation protocol: Yes   Plan:    Resume heparin drip at 1600 units/hr ~11am   Daily heparin level, aPTT and CBC.   Xarelto on hold.  Arty Baumgartner, [place] Pager: [phone] 06/08/2017,10:47 AM

## 2017-06-08 NOTE — Interval H&P Note (Signed)
History and Physical Interval Note:  06/08/2017 9:40 AM  Terry Martinez.  has presented today for surgery, with the diagnosis of chf  The various methods of treatment have been discussed with the patient and family. After consideration of risks, benefits and other options for treatment, the patient has consented to  Procedure(s): RIGHT HEART CATH (N/A) as a surgical intervention .  The patient's history has been reviewed, patient examined, no change in status, stable for surgery.  I have reviewed the patient's chart and labs.  Questions were answered to the patient's satisfaction.     [name]

## 2017-06-08 NOTE — Progress Notes (Addendum)
Progress Note  Patient Name: Terry Martinez. Date of Encounter: 06/08/2017  Primary Cardiologist:   Dorris Carnes, MD   Subjective   Complains of constipation.  Legs feel better and less SOB.   Inpatient Medications    Scheduled Meds: . allopurinol  100 mg Oral Daily  . [START ON 06/09/2017] aspirin EC  81 mg Oral Daily  . cholecalciferol  5,000 Units Oral BH-q7a  . hydrALAZINE  12.5 mg Oral Q8H  . isosorbide mononitrate  15 mg Oral Daily  . metoprolol succinate  12.5 mg Oral Daily  . mometasone-formoterol  2 puff Inhalation BID  . potassium chloride  40 mEq Oral BID  . pravastatin  40 mg Oral q1800  . sodium chloride flush  3 mL Intravenous Q12H  . sodium chloride flush  3 mL Intravenous Q12H  . vitamin C  500 mg Oral Daily   Continuous Infusions: . sodium chloride    . sodium chloride    . sodium chloride 10 mL/hr at 06/08/17 0618  . furosemide 120 mg (06/08/17 0617)  . heparin 1,600 Units/hr (06/08/17 0630)   PRN Meds: sodium chloride, sodium chloride, acetaminophen, albuterol, docusate sodium, magnesium hydroxide, nitroGLYCERIN, ondansetron (ZOFRAN) IV, polyvinyl alcohol, sodium chloride flush, sodium chloride flush   Vital Signs    Vitals:   06/07/17 2221 06/08/17 0616 06/08/17 0639 06/08/17 0642  BP: (!) 120/56 (!) 120/50 (!) 118/49   Pulse:   (!) 44   Resp:      Temp:   (!) 97.5 F (36.4 C)   TempSrc:   Oral   SpO2:   91%   Weight:    299 lb 9.6 oz (135.9 kg)  Height:        Intake/Output Summary (Last 24 hours) at 06/08/2017 0818 Last data filed at 06/08/2017 0643 Gross per 24 hour  Intake 655.73 ml  Output 3000 ml  Net -2344.27 ml   Filed Weights   06/06/17 2300 06/07/17 0458 06/08/17 0642  Weight: 296 lb 4.8 oz (134.4 kg) 298 lb 14.4 oz (135.6 kg) 299 lb 9.6 oz (135.9 kg)    Telemetry    NSR, run of wide complex.  Possible Vtach - Personally Reviewed  ECG    NA - Personally Reviewed  Physical Exam   GEN: No acute distress.   Neck:  No  JVD Cardiac: RRR, soft systolic murmur, no diastolic murmurs, rubs, or gallops.  Respiratory:    Few crackles. GI: Soft, nontender, non-distended  MS: No  edema; No deformity. Neuro:  Nonfocal  Psych: Normal affect   Labs    Chemistry Recent Labs  Lab 06/06/17 1705 06/07/17 0432  NA 136 137  K 4.0 3.3*  CL 100* 99*  CO2 25 28  GLUCOSE 72 99  BUN 35* 39*  CREATININE 2.20* 2.25*  CALCIUM 8.9 8.8*  GFRNONAA 27* 27*  GFRAA 32* 31*  ANIONGAP 11 10     Hematology Recent Labs  Lab 06/06/17 1506  WBC 4.0  RBC 2.87*  HGB 9.0*  HCT 29.3*  MCV 102.1*  MCH 31.4  MCHC 30.7  RDW 17.1*  PLT 122*    Cardiac EnzymesNo results for input(s): TROPONINI in the last 168 hours.  Recent Labs  Lab 06/06/17 1645  TROPIPOC 0.01     BNP Recent Labs  Lab 06/06/17 1506  BNP 855.4*     DDimer No results for input(s): DDIMER in the last 168 hours.   Radiology    Dg Chest 2 View  Result Date: 06/06/2017 CLINICAL DATA:  Progressive shortness of breath and chest pain for 5 days. History of CHF. EXAM: CHEST - 2 VIEW COMPARISON:  Chest radiograph May 08, 2017 FINDINGS: Stable cardiomegaly. Tortuous calcified aorta. Pulmonary vascular congestion. Small pleural effusions with bibasilar strandy densities. LEFT subclavian artery stent. No pneumothorax. Soft tissue planes and included osseous structures are nonsuspicious. Mild degenerative change of the thoracolumbar junction. Surgical clip projects in LEFT upper chest. IMPRESSION: Stable cardiomegaly and pulmonary vascular congestion. Small pleural effusions. Bibasilar atelectasis. Aortic Atherosclerosis (ICD10-I70.0). Electronically Signed   By: Elon Alas M.D.   On: 06/06/2017 16:41    Cardiac Studies   ECHO:  01/26/17  Study Conclusions  - Left ventricle: The cavity size was moderately dilated. Systolic   function was moderately reduced. The estimated ejection fraction   was in the range of 35% to 40%. Wall motion was  normal; there   were no regional wall motion abnormalities. - Ventricular septum: The contour showed diastolic flattening and   systolic flattening. - Aortic valve: Valve mobility was restricted. There was very mild   stenosis. There was moderate regurgitation. - Aortic root: The aortic root was dilated measuring 49 mm. - Mitral valve: Calcified annulus. Mildly thickened leaflets .   There was mild regurgitation. - Left atrium: The atrium was severely dilated. - Right ventricle: The cavity size was severely dilated. Wall   thickness was normal. Systolic function was severely reduced. - Right atrium: The atrium was severely dilated. - Tricuspid valve: There was moderate-severe regurgitation. - Pulmonic valve: There was moderate regurgitation. - Pulmonary arteries: Systolic pressure was moderately increased.   PA peak pressure: 45 mm Hg (S). - Inferior vena cava: The vessel was dilated. The respirophasic   diameter changes were blunted (< 50%), consistent with elevated   central venous pressure.  Impressions:  - When compared to the prior study from 05/26/2016 LVEF has   decreased, now 35-40%. LV is moderately dilated.   RV is severely dilated with severe systolic dysfunction.   There is biatrial atrial dilatation.   Aortic regurgitation is moderate, aortic root has further dilated   at the sinus level measuring 49 mm, previously 47 mm.   Moderate pulmonary hypertension.  Patient Profile     77 y.o. 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), CAD (multiple PCIs, last in 2014), permanent Afib,PAD s/p aorta-bifemoral bypass, permanent atrial fibrillation, AI, CKD stage IIIwho presents with worsening DOE, LE edema and acute on chronic heart failure  Assessment & Plan    ACUTE ON CHRONIC SYSTOLIC AND DIASTOLIC HF:   Down 3.47 liters yesterday.   Weight is unchanged.   Severe RV dysfunction with increasing pulmonary pressures. Plan right heart cath  today.   Continue IV diuresis  CAD:  No evidence of active ischemia.  Continue medical therapy.    ATRIAL FIB:  Xarelto on hold.  On heparin.   CKD:  Creat is up slightly again today.  Follow closely.    AO ROOT DILATATION:  This will need to be followed closely as an out patient.    HYPOKALEMIA:  He will get 80 meq today Kdur and 40 meq bid is ordered ongoing.  This will need to be followed and dose reduced if necessary.    For questions or updates, please contact [place] Please consult [email or web address] for contact info under Cardiology/STEMI.   Signed, Minus Breeding, MD  06/08/2017, 8:18 AM

## 2017-06-08 NOTE — H&P (View-Only) (Signed)
Progress Note  Patient Name: Terry Martinez. Date of Encounter: 06/08/2017  Primary Cardiologist:   Dorris Carnes, MD   Subjective   Complains of constipation.  Legs feel better and less SOB.   Inpatient Medications    Scheduled Meds: . allopurinol  100 mg Oral Daily  . [START ON 06/09/2017] aspirin EC  81 mg Oral Daily  . cholecalciferol  5,000 Units Oral BH-q7a  . hydrALAZINE  12.5 mg Oral Q8H  . isosorbide mononitrate  15 mg Oral Daily  . metoprolol succinate  12.5 mg Oral Daily  . mometasone-formoterol  2 puff Inhalation BID  . potassium chloride  40 mEq Oral BID  . pravastatin  40 mg Oral q1800  . sodium chloride flush  3 mL Intravenous Q12H  . sodium chloride flush  3 mL Intravenous Q12H  . vitamin C  500 mg Oral Daily   Continuous Infusions: . sodium chloride    . sodium chloride    . sodium chloride 10 mL/hr at 06/08/17 0618  . furosemide 120 mg (06/08/17 0617)  . heparin 1,600 Units/hr (06/08/17 0630)   PRN Meds: sodium chloride, sodium chloride, acetaminophen, albuterol, docusate sodium, magnesium hydroxide, nitroGLYCERIN, ondansetron (ZOFRAN) IV, polyvinyl alcohol, sodium chloride flush, sodium chloride flush   Vital Signs    Vitals:   06/07/17 2221 06/08/17 0616 06/08/17 0639 06/08/17 0642  BP: (!) 120/56 (!) 120/50 (!) 118/49   Pulse:   (!) 44   Resp:      Temp:   (!) 97.5 F (36.4 C)   TempSrc:   Oral   SpO2:   91%   Weight:    299 lb 9.6 oz (135.9 kg)  Height:        Intake/Output Summary (Last 24 hours) at 06/08/2017 0818 Last data filed at 06/08/2017 0643 Gross per 24 hour  Intake 655.73 ml  Output 3000 ml  Net -2344.27 ml   Filed Weights   06/06/17 2300 06/07/17 0458 06/08/17 0642  Weight: 296 lb 4.8 oz (134.4 kg) 298 lb 14.4 oz (135.6 kg) 299 lb 9.6 oz (135.9 kg)    Telemetry    NSR, run of wide complex.  Possible Vtach - Personally Reviewed  ECG    NA - Personally Reviewed  Physical Exam   GEN: No acute distress.   Neck:  No  JVD Cardiac: RRR, soft systolic murmur, no diastolic murmurs, rubs, or gallops.  Respiratory:    Few crackles. GI: Soft, nontender, non-distended  MS: No  edema; No deformity. Neuro:  Nonfocal  Psych: Normal affect   Labs    Chemistry Recent Labs  Lab 06/06/17 1705 06/07/17 0432  NA 136 137  K 4.0 3.3*  CL 100* 99*  CO2 25 28  GLUCOSE 72 99  BUN 35* 39*  CREATININE 2.20* 2.25*  CALCIUM 8.9 8.8*  GFRNONAA 27* 27*  GFRAA 32* 31*  ANIONGAP 11 10     Hematology Recent Labs  Lab 06/06/17 1506  WBC 4.0  RBC 2.87*  HGB 9.0*  HCT 29.3*  MCV 102.1*  MCH 31.4  MCHC 30.7  RDW 17.1*  PLT 122*    Cardiac EnzymesNo results for input(s): TROPONINI in the last 168 hours.  Recent Labs  Lab 06/06/17 1645  TROPIPOC 0.01     BNP Recent Labs  Lab 06/06/17 1506  BNP 855.4*     DDimer No results for input(s): DDIMER in the last 168 hours.   Radiology    Dg Chest 2 View  Result Date: 06/06/2017 CLINICAL DATA:  Progressive shortness of breath and chest pain for 5 days. History of CHF. EXAM: CHEST - 2 VIEW COMPARISON:  Chest radiograph May 08, 2017 FINDINGS: Stable cardiomegaly. Tortuous calcified aorta. Pulmonary vascular congestion. Small pleural effusions with bibasilar strandy densities. LEFT subclavian artery stent. No pneumothorax. Soft tissue planes and included osseous structures are nonsuspicious. Mild degenerative change of the thoracolumbar junction. Surgical clip projects in LEFT upper chest. IMPRESSION: Stable cardiomegaly and pulmonary vascular congestion. Small pleural effusions. Bibasilar atelectasis. Aortic Atherosclerosis (ICD10-I70.0). Electronically Signed   By: Elon Alas M.D.   On: 06/06/2017 16:41    Cardiac Studies   ECHO:  01/26/17  Study Conclusions  - Left ventricle: The cavity size was moderately dilated. Systolic   function was moderately reduced. The estimated ejection fraction   was in the range of 35% to 40%. Wall motion was  normal; there   were no regional wall motion abnormalities. - Ventricular septum: The contour showed diastolic flattening and   systolic flattening. - Aortic valve: Valve mobility was restricted. There was very mild   stenosis. There was moderate regurgitation. - Aortic root: The aortic root was dilated measuring 49 mm. - Mitral valve: Calcified annulus. Mildly thickened leaflets .   There was mild regurgitation. - Left atrium: The atrium was severely dilated. - Right ventricle: The cavity size was severely dilated. Wall   thickness was normal. Systolic function was severely reduced. - Right atrium: The atrium was severely dilated. - Tricuspid valve: There was moderate-severe regurgitation. - Pulmonic valve: There was moderate regurgitation. - Pulmonary arteries: Systolic pressure was moderately increased.   PA peak pressure: 45 mm Hg (S). - Inferior vena cava: The vessel was dilated. The respirophasic   diameter changes were blunted (< 50%), consistent with elevated   central venous pressure.  Impressions:  - When compared to the prior study from 05/26/2016 LVEF has   decreased, now 35-40%. LV is moderately dilated.   RV is severely dilated with severe systolic dysfunction.   There is biatrial atrial dilatation.   Aortic regurgitation is moderate, aortic root has further dilated   at the sinus level measuring 49 mm, previously 47 mm.   Moderate pulmonary hypertension.  Patient Profile     77 y.o. 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   Weight is unchanged.   Severe RV dysfunction with increasing pulmonary pressures. Plan right heart cath  today.   Continue IV diuresis  CAD:  No evidence of active ischemia.  Continue medical therapy.    ATRIAL FIB:  Xarelto on hold.  On heparin.   CKD:  Creat is up slightly again today.  Follow closely.    AO ROOT DILATATION:  This will need to be followed closely as an out patient.     For questions or updates, please contact [place] Please consult [email or web address] for contact info under Cardiology/STEMI.   Signed, Minus Breeding, MD  06/08/2017, 8:18 AM

## 2017-06-08 NOTE — Consult Note (Signed)
[facility] CM Inpatient Consult   06/08/2017  Terry Martinez. 12-22-1940 [id number]  Met with patient and wife at the bedside to follow up on ongoing care management  services. They consent to ongoing follow up services.  Patient is in the [place] of the [place] Management services under patient's Medicare plan.  Patient states he is not sure of all if any medication changes. Expressed that his care manager will follow.  Offered another brochure and magnet and wife states we have ours.  Contact information given.  For questions contact:   Natividad Brood, RN BSN [facility] Liaison  [phone] business mobile phone Toll free office [phone]

## 2017-06-08 NOTE — Care Management Note (Signed)
Case Management Note  Patient Details  Name: Terry Martinez. MRN: [id number] Date of Birth: [birth year]  Subjective/Objective:    Pt admitted with SOB                Action/Plan:  PTA independent from home - recently discharged with [facility] and PT [facility].  Agency contacted and informed pt is admitted.  Pt informed CM that he weighs daily and adheres to low sodium intake.  CM requested resumption orders for [facility]   Expected Discharge Date:  06/10/17               Expected Discharge Plan:  [place]  In-House Referral:     Discharge planning Services  CM Consult  Post Acute Care Choice:    Choice offered to:  Patient  DME Arranged:    DME Agency:     HH Arranged:  RN, Disease Management, PT [place] Agency:  [facility]  Status of Service:  In process, will continue to follow  If discussed at Long Length of Stay Meetings, dates discussed:    Additional Comments:  Maryclare Labrador, RN 06/08/2017, 1:54 PM

## 2017-06-09 DIAGNOSIS — I5033 Acute on chronic diastolic (congestive) heart failure: Secondary | ICD-10-CM

## 2017-06-09 LAB — CBC
HCT: 29.4 % — ABNORMAL LOW (ref 39.0–52.0)
Hemoglobin: 9 g/dL — ABNORMAL LOW (ref 13.0–17.0)
MCH: 31 pg (ref 26.0–34.0)
MCHC: 30.6 g/dL (ref 30.0–36.0)
MCV: 101.4 fL — ABNORMAL HIGH (ref 78.0–100.0)
PLATELETS: 119 10*3/uL — AB (ref 150–400)
RBC: 2.9 MIL/uL — ABNORMAL LOW (ref 4.22–5.81)
RDW: 17.2 % — AB (ref 11.5–15.5)
WBC: 3.8 10*3/uL — ABNORMAL LOW (ref 4.0–10.5)

## 2017-06-09 LAB — BASIC METABOLIC PANEL
Anion gap: 12 (ref 5–15)
BUN: 42 mg/dL — AB (ref 6–20)
CALCIUM: 8.7 mg/dL — AB (ref 8.9–10.3)
CO2: 27 mmol/L (ref 22–32)
CREATININE: 2.02 mg/dL — AB (ref 0.61–1.24)
Chloride: 98 mmol/L — ABNORMAL LOW (ref 101–111)
GFR, EST AFRICAN AMERICAN: 35 mL/min — AB (ref >60)
GFR, EST NON AFRICAN AMERICAN: 30 mL/min — AB (ref >60)
Glucose, Bld: 102 mg/dL — ABNORMAL HIGH (ref 65–99)
Potassium: 2.9 mmol/L — ABNORMAL LOW (ref 3.5–5.1)
SODIUM: 137 mmol/L (ref 135–145)

## 2017-06-09 LAB — HEPARIN LEVEL (UNFRACTIONATED): HEPARIN UNFRACTIONATED: 0.55 [IU]/mL (ref 0.30–0.70)

## 2017-06-09 LAB — APTT: APTT: 78 s — AB (ref 24–36)

## 2017-06-09 MED ORDER — POTASSIUM CHLORIDE CRYS ER 20 MEQ PO TBCR: 40.0000 meq | EXTENDED_RELEASE_TABLET | Freq: Two times a day (BID) | ORAL | Status: DC

## 2017-06-09 MED ORDER — RIVAROXABAN 15 MG PO TABS
15.0000 mg | ORAL_TABLET | Freq: Every day | ORAL | Status: DC
  Administered 2017-06-09 – 2017-06-17 (×9): 15 mg via ORAL
  Filled 2017-06-09 (×9): qty 1

## 2017-06-09 NOTE — Progress Notes (Signed)
ANTICOAGULATION CONSULT NOTE - follow=up Consult  Pharmacy Consult for Heparin (Xarelto on hold) Indication: atrial fibrillation  Allergies  Allergen Reactions  . Zocor [Simvastatin] Hives and Rash    Patient Measurements: Height: 6\' 3"  (190.5 cm) Weight: (!) 300 lb 3.2 oz (136.2 kg)(b scale) IBW/kg (Calculated) : 84.5 Heparin Dosing Weight: 115 kg  Vital Signs: Temp: 97.3 F (36.3 C) (04/20 0541) Temp Source: Oral (04/20 0541) BP: 146/58 (04/20 1050) Pulse Rate: 61 (04/20 1050)  Labs: Recent Labs    06/06/17 1506  06/07/17 0432 06/07/17 2022 06/08/17 0750 06/09/17 0430  HGB 9.0*  --   --   --  9.0* 9.0*  HCT 29.3*  --   --   --  29.1* 29.4*  PLT 122*  --   --   --  114* 119*  APTT  --   --   --  39* 86* 78*  HEPARINUNFRC  --   --   --  1.60* 1.16* 0.55  CREATININE  --    < > 2.25*  --  2.04* 2.02*   < > = values in this interval not displayed.    Estimated Creatinine Clearance: 46.3 mL/min (A) (by C-G formula based on SCr of 2.02 mg/dL (H)).  Assessment: 77 yr old male on Xarelto 15 mg daily prior to admission for atrial fibrillation continues on heparin s/p cath  Heparin level therapeutic this morning  Goal of Therapy:  Heparin level = 0.3-0.7 Monitor platelets by anticoagulation protocol: Yes   Plan:  Continue heparin at 1600 units / hr Daily heparin leveland CBC. Xarelto on hold.  Thank you Anette Guarneri, PharmD [phone] 06/09/2017,11:39 AM

## 2017-06-09 NOTE — Progress Notes (Signed)
Patient resting comfortably during shift report. Denies complaints.  

## 2017-06-09 NOTE — Progress Notes (Signed)
Progress Note  Patient Name: Terry Martinez. Date of Encounter: 06/09/2017  Primary Cardiologist:   Dorris Carnes, MD   Subjective   Less dyspnea no chest pain or palpitations .   Inpatient Medications    Scheduled Meds: . allopurinol  100 mg Oral Daily  . aspirin EC  81 mg Oral Daily  . cholecalciferol  5,000 Units Oral BH-q7a  . hydrALAZINE  12.5 mg Oral Q8H  . isosorbide mononitrate  15 mg Oral Daily  . metoprolol succinate  12.5 mg Oral Daily  . mometasone-formoterol  2 puff Inhalation BID  . potassium chloride  40 mEq Oral BID  . pravastatin  40 mg Oral q1800  . sodium chloride flush  3 mL Intravenous Q12H  . sodium chloride flush  3 mL Intravenous Q12H  . vitamin C  500 mg Oral Daily   Continuous Infusions: . sodium chloride    . sodium chloride    . furosemide Stopped (06/09/17 0175)  . heparin 1,600 Units/hr (06/09/17 0558)   PRN Meds: sodium chloride, sodium chloride, acetaminophen, albuterol, docusate sodium, magnesium hydroxide, nitroGLYCERIN, ondansetron (ZOFRAN) IV, polyvinyl alcohol, sodium chloride flush, sodium chloride flush   Vital Signs    Vitals:   06/09/17 0619 06/09/17 0826 06/09/17 1050 06/09/17 1242  BP:   (!) 146/58 (!) 121/53  Pulse:   61 60  Resp:    18  Temp:    (!) 96.8 F (36 C)  TempSrc:    Oral  SpO2:  94%  93%  Weight: (!) 300 lb 3.2 oz (136.2 kg)     Height:        Intake/Output Summary (Last 24 hours) at 06/09/2017 1341 Last data filed at 06/09/2017 1100 Gross per 24 hour  Intake 1140.8 ml  Output 1000 ml  Net 140.8 ml   Filed Weights   06/07/17 0458 06/08/17 0642 06/09/17 0619  Weight: 298 lb 14.4 oz (135.6 kg) 299 lb 9.6 oz (135.9 kg) (!) 300 lb 3.2 oz (136.2 kg)    Telemetry    NSR 06/09/2017   ECG    Afib IVCD lateral T wave changes   Physical Exam   Affect appropriate Morbidly obese white male  HEENT: normal Neck supple with no adenopathy JVP normal no bruits no thyromegaly Lungs clear with no  wheezing and good diaphragmatic motion Heart:  S1/S2 SEM murmur, no rub, gallop or click PMI normal Abdomen: benighn, BS positve, no tenderness, no AAA no bruit.  No HSM or HJR Post aortofem bypass with palpable pedal pulses  Plus 3 bilateral LE edema with stasis  Neuro non-focal Skin warm and dry No muscular weakness   Labs    Chemistry Recent Labs  Lab 06/07/17 0432 06/08/17 0750 06/09/17 0430  NA 137 137 137  K 3.3* 3.1* 2.9*  CL 99* 99* 98*  CO2 28 27 27   GLUCOSE 99 128* 102*  BUN 39* 42* 42*  CREATININE 2.25* 2.04* 2.02*  CALCIUM 8.8* 8.7* 8.7*  GFRNONAA 27* 30* 30*  GFRAA 31* 35* 35*  ANIONGAP 10 11 12      Hematology Recent Labs  Lab 06/06/17 1506 06/08/17 0750 06/09/17 0430  WBC 4.0 3.8* 3.8*  RBC 2.87* 2.88* 2.90*  HGB 9.0* 9.0* 9.0*  HCT 29.3* 29.1* 29.4*  MCV 102.1* 101.0* 101.4*  MCH 31.4 31.3 31.0  MCHC 30.7 30.9 30.6  RDW 17.1* 17.1* 17.2*  PLT 122* 114* 119*    Cardiac EnzymesNo results for input(s): TROPONINI in the last 168 hours.  Recent Labs  Lab 06/06/17 1645  TROPIPOC 0.01     BNP Recent Labs  Lab 06/06/17 1506  BNP 855.4*     DDimer No results for input(s): DDIMER in the last 168 hours.   Radiology    No results found.  Cardiac Studies   ECHO:  01/26/17  Study Conclusions  - Left ventricle: The cavity size was moderately dilated. Systolic   function was moderately reduced. The estimated ejection fraction   was in the range of 35% to 40%. Wall motion was normal; there   were no regional wall motion abnormalities. - Ventricular septum: The contour showed diastolic flattening and   systolic flattening. - Aortic valve: Valve mobility was restricted. There was very mild   stenosis. There was moderate regurgitation. - Aortic root: The aortic root was dilated measuring 49 mm. - Mitral valve: Calcified annulus. Mildly thickened leaflets .   There was mild regurgitation. - Left atrium: The atrium was severely  dilated. - Right ventricle: The cavity size was severely dilated. Wall   thickness was normal. Systolic function was severely reduced. - Right atrium: The atrium was severely dilated. - Tricuspid valve: There was moderate-severe regurgitation. - Pulmonic valve: There was moderate regurgitation. - Pulmonary arteries: Systolic pressure was moderately increased.   PA peak pressure: 45 mm Hg (S). - Inferior vena cava: The vessel was dilated. The respirophasic   diameter changes were blunted (< 50%), consistent with elevated   central venous pressure.  Impressions:  - When compared to the prior study from 05/26/2016 LVEF has   decreased, now 35-40%. LV is moderately dilated.   RV is severely dilated with severe systolic dysfunction.   There is biatrial atrial dilatation.   Aortic regurgitation is moderate, aortic root has further dilated   at the sinus level measuring 49 mm, previously 47 mm.   Moderate pulmonary hypertension.  Patient Profile     77 y.o. 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), CAD (multiple PCIs, last in 2014), permanent Afib,PAD s/p aorta-bifemoral bypass, permanent atrial fibrillation, AI, CKD stage IIIwho presents with worsening DOE, LE edema and acute on chronic heart failure  Assessment & Plan    ACUTE ON CHRONIC SYSTOLIC AND DIASTOLIC HF:   Right heart cath done 4/19 by Dr Jeffie Pollock PA 48/16 PCWP 17 Suggested only weight loss diuresis and sleep study Patient denies history of OSA   CAD:  Stable no angina continue ASA beta blocker and statin    ATRIAL FIB:   Now post cath can resume xarelto in am    CKD:  Will need to accept some azotemia to get fluid off supplement K   AO ROOT DILATATION:  This will need to be followed closely as an out patient.    HYPOKALEMIA:  Will order 40 bid today    For questions or updates, please contact [place] Please consult [email or web address] for contact info under  Cardiology/STEMI.   Signed, Jenkins Rouge, MD  06/09/2017, 1:41 PM

## 2017-06-09 NOTE — Progress Notes (Signed)
[name] at bedside, states that pt has hx of low platelets. She states that his son and niece also suffer from low platelets, that MD's have closely monitored in years past.   Half-life and reversal agents of heparin explained to pt and wife, in effort to provide peace of mind. Both learners expressed understanding.

## 2017-06-10 LAB — BASIC METABOLIC PANEL
Anion gap: 13 (ref 5–15)
BUN: 47 mg/dL — AB (ref 6–20)
CALCIUM: 9.1 mg/dL (ref 8.9–10.3)
CO2: 26 mmol/L (ref 22–32)
Chloride: 98 mmol/L — ABNORMAL LOW (ref 101–111)
Creatinine, Ser: 2.23 mg/dL — ABNORMAL HIGH (ref 0.61–1.24)
GFR calc Af Amer: 31 mL/min — ABNORMAL LOW (ref >60)
GFR, EST NON AFRICAN AMERICAN: 27 mL/min — AB (ref >60)
GLUCOSE: 117 mg/dL — AB (ref 65–99)
Potassium: 3.6 mmol/L (ref 3.5–5.1)
Sodium: 137 mmol/L (ref 135–145)

## 2017-06-10 LAB — HEPARIN LEVEL (UNFRACTIONATED): Heparin Unfractionated: >2.2 IU/mL — ABNORMAL HIGH (ref 0.30–0.70)

## 2017-06-10 LAB — CBC
HCT: 30.2 % — ABNORMAL LOW (ref 39.0–52.0)
HEMOGLOBIN: 9.5 g/dL — AB (ref 13.0–17.0)
MCH: 32.2 pg (ref 26.0–34.0)
MCHC: 31.5 g/dL (ref 30.0–36.0)
MCV: 102.4 fL — AB (ref 78.0–100.0)
PLATELETS: 148 10*3/uL — AB (ref 150–400)
RBC: 2.95 MIL/uL — ABNORMAL LOW (ref 4.22–5.81)
RDW: 16.9 % — ABNORMAL HIGH (ref 11.5–15.5)
WBC: 4.7 10*3/uL (ref 4.0–10.5)

## 2017-06-10 MED ORDER — METOLAZONE 5 MG PO TABS
5.0000 mg | ORAL_TABLET | Freq: Once | ORAL | Status: AC
  Administered 2017-06-10: 5 mg via ORAL
  Filled 2017-06-10: qty 1

## 2017-06-10 NOTE — Discharge Instructions (Signed)

## 2017-06-10 NOTE — Progress Notes (Signed)
Progress Note  Patient Name: Terry Martinez. Date of Encounter: 06/10/2017  Primary Cardiologist: Dorris Carnes, MD   Subjective   SOB improving, LE edema improving.   Inpatient Medications    Scheduled Meds: . allopurinol  100 mg Oral Daily  . aspirin EC  81 mg Oral Daily  . cholecalciferol  5,000 Units Oral BH-q7a  . hydrALAZINE  12.5 mg Oral Q8H  . isosorbide mononitrate  15 mg Oral Daily  . metoprolol succinate  12.5 mg Oral Daily  . mometasone-formoterol  2 puff Inhalation BID  . potassium chloride  40 mEq Oral BID  . pravastatin  40 mg Oral q1800  . rivaroxaban  15 mg Oral Q supper  . sodium chloride flush  3 mL Intravenous Q12H  . sodium chloride flush  3 mL Intravenous Q12H  . vitamin C  500 mg Oral Daily   Continuous Infusions: . sodium chloride    . sodium chloride    . furosemide Stopped (06/10/17 5631)   PRN Meds: sodium chloride, sodium chloride, acetaminophen, albuterol, docusate sodium, magnesium hydroxide, nitroGLYCERIN, ondansetron (ZOFRAN) IV, polyvinyl alcohol, sodium chloride flush, sodium chloride flush   Vital Signs    Vitals:   06/09/17 1953 06/10/17 0530 06/10/17 0700 06/10/17 1001  BP: (!) 144/43 (!) 109/54  (!) 135/55  Pulse: 67 65  65  Resp: 18 18    Temp: (!) 97.3 F (36.3 C) 97.7 F (36.5 C)    TempSrc: Oral Oral    SpO2: 95% 93%    Weight:  (!) 302 lb 6.4 oz (137.2 kg) (!) 301 lb 9.6 oz (136.8 kg)   Height:        Intake/Output Summary (Last 24 hours) at 06/10/2017 1051 Last data filed at 06/10/2017 0800 Gross per 24 hour  Intake 774 ml  Output 1350 ml  Net -576 ml   Filed Weights   06/09/17 0619 06/10/17 0530 06/10/17 0700  Weight: (!) 300 lb 3.2 oz (136.2 kg) (!) 302 lb 6.4 oz (137.2 kg) (!) 301 lb 9.6 oz (136.8 kg)    Telemetry    Rate controlled afib - Personally Reviewed  ECG    na  Physical Exam   GEN: No acute distress.   Neck: No JVD Cardiac: RRR, no murmurs, rubs, or gallops.  Respiratory: Clear to  auscultation bilaterally. GI: Soft, nontender, non-distended  MS: 2+ bilateral LE edema; No deformity. Neuro:  Nonfocal  Psych: Normal affect   Labs    Chemistry Recent Labs  Lab 06/08/17 0750 06/09/17 0430 06/10/17 0707  NA 137 137 137  K 3.1* 2.9* 3.6  CL 99* 98* 98*  CO2 27 27 26   GLUCOSE 128* 102* 117*  BUN 42* 42* 47*  CREATININE 2.04* 2.02* 2.23*  CALCIUM 8.7* 8.7* 9.1  GFRNONAA 30* 30* 27*  GFRAA 35* 35* 31*  ANIONGAP 11 12 13      Hematology Recent Labs  Lab 06/08/17 0750 06/09/17 0430 06/10/17 0707  WBC 3.8* 3.8* 4.7  RBC 2.88* 2.90* 2.95*  HGB 9.0* 9.0* 9.5*  HCT 29.1* 29.4* 30.2*  MCV 101.0* 101.4* 102.4*  MCH 31.3 31.0 32.2  MCHC 30.9 30.6 31.5  RDW 17.1* 17.2* 16.9*  PLT 114* 119* 148*    Cardiac EnzymesNo results for input(s): TROPONINI in the last 168 hours.  Recent Labs  Lab 06/06/17 1645  TROPIPOC 0.01     BNP Recent Labs  Lab 06/06/17 1506  BNP 855.4*     DDimer No results for input(s): DDIMER  in the last 168 hours.   Radiology    No results found.  Cardiac Studies     Patient Profile     77 y.o. 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 Acute on chronic systolic/diastolic HF with severe RV dysfunction - 01/2017 echo LVEF 35-40%, mild AS, mod AI, severe RV dysfunction, mod to severe TR, PASP 45 - 06/08/17 RHC mean PA 27, PCWP 17. PVR 1.4. RA 17. Mild pulm HTN, transpulm gradient of 10 - negative 344mL yesterday, neg 3.3 L since admission. Patient on lasix 120mg  IV bid, uptrend in Cr.  - will need outpt sleep study  - still significant LE edema. Mildly elevated wedge, large component likely is his RV failure, perhaps some obesity of venous  insufficiency going on as well - continue diuretics today. Fairly minimal diuresis yesterday on high dose lasix, dose metolazone 5mg  today   2. CAD - no active issues  3. Afib - continue xarelto, rate control  4. CKD - accepting higher Cr for diuresis. Baseline 2.2-2.8 3 months ago, 1.5-1.9 5 months ago   For questions or updates, please contact [place] Please consult [email or web address] for contact info under Cardiology/STEMI.      Merrily Pew, MD  06/10/2017, 10:51 AM

## 2017-06-11 DIAGNOSIS — N189 Chronic kidney disease, unspecified: Secondary | ICD-10-CM

## 2017-06-11 DIAGNOSIS — I4891 Unspecified atrial fibrillation: Secondary | ICD-10-CM

## 2017-06-11 LAB — UPEP/UIFE/LIGHT CHAINS/TP, 24-HR UR
% BETA, URINE: 0 %
ALPHA 1 URINE: 0 %
ALPHA 2 UR: 0 %
Albumin, U: 100 %
FREE KAPPA/LAMBDA RATIO: 30.53 — AB (ref 2.04–10.37)
FREE LAMBDA LT CHAINS, UR: 1.31 mg/L (ref 0.24–6.66)
Free Kappa Lt Chains,Ur: 40 mg/L — ABNORMAL HIGH (ref 1.35–24.19)
GAMMA GLOBULIN URINE: 0 %
Total Protein, Urine-Ur/day: <120 mg/24 hr (ref 30–150)
Total Protein, Urine: <4 mg/dL
Total Volume: 3000

## 2017-06-11 LAB — BASIC METABOLIC PANEL
ANION GAP: 11 (ref 5–15)
BUN: 51 mg/dL — ABNORMAL HIGH (ref 6–20)
CHLORIDE: 97 mmol/L — AB (ref 101–111)
CO2: 27 mmol/L (ref 22–32)
Calcium: 9.1 mg/dL (ref 8.9–10.3)
Creatinine, Ser: 2.19 mg/dL — ABNORMAL HIGH (ref 0.61–1.24)
GFR calc Af Amer: 32 mL/min — ABNORMAL LOW (ref >60)
GFR calc non Af Amer: 28 mL/min — ABNORMAL LOW (ref >60)
GLUCOSE: 98 mg/dL (ref 65–99)
POTASSIUM: 3.2 mmol/L — AB (ref 3.5–5.1)
Sodium: 135 mmol/L (ref 135–145)

## 2017-06-11 LAB — MAGNESIUM: Magnesium: 2.2 mg/dL (ref 1.7–2.4)

## 2017-06-11 MED ORDER — METOLAZONE 5 MG PO TABS
5.0000 mg | ORAL_TABLET | Freq: Once | ORAL | Status: AC
  Administered 2017-06-11: 5 mg via ORAL
  Filled 2017-06-11: qty 1

## 2017-06-11 NOTE — Progress Notes (Addendum)
Progress Note  Patient Name: Terry Martinez. Date of Encounter: 06/11/2017  Primary Cardiologist: Dorris Carnes, MD   Subjective   Edema improved in upper thigh area, on room air, positive result with metolazone yesterday  Inpatient Medications    Scheduled Meds: . allopurinol  100 mg Oral Daily  . aspirin EC  81 mg Oral Daily  . cholecalciferol  5,000 Units Oral BH-q7a  . hydrALAZINE  12.5 mg Oral Q8H  . isosorbide mononitrate  15 mg Oral Daily  . metoprolol succinate  12.5 mg Oral Daily  . mometasone-formoterol  2 puff Inhalation BID  . potassium chloride  40 mEq Oral BID  . pravastatin  40 mg Oral q1800  . rivaroxaban  15 mg Oral Q supper  . sodium chloride flush  3 mL Intravenous Q12H  . sodium chloride flush  3 mL Intravenous Q12H  . vitamin C  500 mg Oral Daily   Continuous Infusions: . sodium chloride    . sodium chloride    . furosemide 120 mg (06/11/17 0810)   PRN Meds: sodium chloride, sodium chloride, acetaminophen, albuterol, docusate sodium, magnesium hydroxide, nitroGLYCERIN, ondansetron (ZOFRAN) IV, polyvinyl alcohol, sodium chloride flush, sodium chloride flush   Vital Signs    Vitals:   06/10/17 1120 06/10/17 1500 06/10/17 1951 06/11/17 0553  BP: (!) 146/69 (!) 152/57 133/79 134/62  Pulse: 60  67 (!) 46  Resp: 18  20 18   Temp: (!) 97.5 F (36.4 C)  (!) 97.5 F (36.4 C) (!) 97.4 F (36.3 C)  TempSrc: Oral  Oral Oral  SpO2: 91%  93% 92%  Weight:    (!) 302 lb 9.6 oz (137.3 kg)  Height:        Intake/Output Summary (Last 24 hours) at 06/11/2017 0947 Last data filed at 06/11/2017 0810 Gross per 24 hour  Intake 1044 ml  Output 2700 ml  Net -1656 ml   Filed Weights   06/10/17 0530 06/10/17 0700 06/11/17 0553  Weight: (!) 302 lb 6.4 oz (137.2 kg) (!) 301 lb 9.6 oz (136.8 kg) (!) 302 lb 9.6 oz (137.3 kg)    Telemetry    afib rate controlled - Personally Reviewed  ECG    No new tracings - Personally Reviewed  Physical Exam   GEN: No  acute distress.   Neck: No JVD - exam difficult Cardiac: irregular rhythm, regular rate Respiratory: Clear to auscultation bilaterally. GI: Soft, nontender, non-distended  MS: significant LE edema with chronic skin changesd Neuro:  Nonfocal  Psych: Normal affect   Labs    Chemistry Recent Labs  Lab 06/09/17 0430 06/10/17 0707 06/11/17 0635  NA 137 137 135  K 2.9* 3.6 3.2*  CL 98* 98* 97*  CO2 27 26 27   GLUCOSE 102* 117* 98  BUN 42* 47* 51*  CREATININE 2.02* 2.23* 2.19*  CALCIUM 8.7* 9.1 9.1  GFRNONAA 30* 27* 28*  GFRAA 35* 31* 32*  ANIONGAP 12 13 11      Hematology Recent Labs  Lab 06/08/17 0750 06/09/17 0430 06/10/17 0707  WBC 3.8* 3.8* 4.7  RBC 2.88* 2.90* 2.95*  HGB 9.0* 9.0* 9.5*  HCT 29.1* 29.4* 30.2*  MCV 101.0* 101.4* 102.4*  MCH 31.3 31.0 32.2  MCHC 30.9 30.6 31.5  RDW 17.1* 17.2* 16.9*  PLT 114* 119* 148*    Cardiac EnzymesNo results for input(s): TROPONINI in the last 168 hours.  Recent Labs  Lab 06/06/17 1645  TROPIPOC 0.01     BNP Recent Labs  Lab 06/06/17  1506  BNP 855.4*     DDimer No results for input(s): DDIMER in the last 168 hours.   Radiology    No results found.  Cardiac Studies   Right heart cath 06/08/17: Findings:  RA = 17 RV = 48/14 PA = 48/16 (27) PCW = 17 (V waves 25-30) Fick cardiac output/index = 7.2/2.8 PVR = 1.4 WU Ao sat = 92% PA sat = 51%, 54%  Assessment: 1. 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 Minimally elevated PCWP with prominent v-waves suggestive of significant MR and/or diastolic dysfunction  Plan/Discussion:  There is near equalization of R & L end diastolic pressures. Suspect main issue is RV failure but cannot exclude component of restrictive CM.   Suggest weight loss and further evaluation with sleep study. Continue diuresis as renal function tolerates.   Patient Profile     77 y.o. 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 Acute on chronic systolic and diastolic heart failure with severe RV dysfunction - right heart cath this admission with RV failure - diuresing on 120 mg IV lasix BID with metolazone x 1 dose yesterday - overall net negative 5 L with 3 L urine output yesterday - weight is 302 lbs - was 296 lbs on admission - sCr 2.19 (2.23) - will dose another metolazone today - 5 mg x 1 - follow daily BMP - patient and family question use of compression hose, states a previous doctor told them not to use compression hose - defer to Dr. Harrington Challenger today   2. Atrial fibrillation - continue xarelto and toprol for rate control - no bleeding issues   3. CAD - Asymptomatic, stable   4. CKD - baseline 2.2-2.8 (3 months prior) - creatinine trending down      For questions or updates, please contact [place] Please consult [email or web address] for contact info under Cardiology/STEMI.      Signed, Tami Lin Duke, PA  06/11/2017, 9:47 AM     Pt seen and examined   I agree with findings of A Duke above    I know pt well from clinic   He has been difficult to regulate with renal insuff and RV and LV dysfunction He is on lasix gtt and diuresing   REnal function is holding   ON exam:  Neck fuld  Lungs are rel clear   Cardiac exam Irreg irreg  No S3  Abd is benign   Lower ext with 2+ edema     Continu current regimen with added Zaroxylyn   Follow renal function Rates in afib controlled.   Dorris Carnes

## 2017-06-12 LAB — BASIC METABOLIC PANEL WITH GFR
Anion gap: 14 (ref 5–15)
BUN: 58 mg/dL — ABNORMAL HIGH (ref 6–20)
CO2: 29 mmol/L (ref 22–32)
Calcium: 9.1 mg/dL (ref 8.9–10.3)
Chloride: 95 mmol/L — ABNORMAL LOW (ref 101–111)
Creatinine, Ser: 2.43 mg/dL — ABNORMAL HIGH (ref 0.61–1.24)
GFR calc Af Amer: 28 mL/min — ABNORMAL LOW
GFR calc non Af Amer: 24 mL/min — ABNORMAL LOW
Glucose, Bld: 118 mg/dL — ABNORMAL HIGH (ref 65–99)
Potassium: 3 mmol/L — ABNORMAL LOW (ref 3.5–5.1)
Sodium: 138 mmol/L (ref 135–145)

## 2017-06-12 LAB — BASIC METABOLIC PANEL
Anion gap: 12 (ref 5–15)
BUN: 57 mg/dL — AB (ref 6–20)
CALCIUM: 9.1 mg/dL (ref 8.9–10.3)
CHLORIDE: 96 mmol/L — AB (ref 101–111)
CO2: 29 mmol/L (ref 22–32)
CREATININE: 2.26 mg/dL — AB (ref 0.61–1.24)
GFR calc non Af Amer: 26 mL/min — ABNORMAL LOW (ref >60)
GFR, EST AFRICAN AMERICAN: 31 mL/min — AB (ref >60)
GLUCOSE: 142 mg/dL — AB (ref 65–99)
Potassium: 2.8 mmol/L — ABNORMAL LOW (ref 3.5–5.1)
Sodium: 137 mmol/L (ref 135–145)

## 2017-06-12 LAB — MAGNESIUM: Magnesium: 2.2 mg/dL (ref 1.7–2.4)

## 2017-06-12 MED ORDER — POTASSIUM CHLORIDE CRYS ER 20 MEQ PO TBCR: 40.0000 meq | EXTENDED_RELEASE_TABLET | Freq: Two times a day (BID) | ORAL | Status: DC

## 2017-06-12 MED ORDER — POTASSIUM CHLORIDE CRYS ER 20 MEQ PO TBCR
40.0000 meq | EXTENDED_RELEASE_TABLET | Freq: Once | ORAL | Status: AC
  Administered 2017-06-12: 40 meq via ORAL
  Filled 2017-06-12: qty 2

## 2017-06-12 MED ORDER — POTASSIUM CHLORIDE CRYS ER 20 MEQ PO TBCR
40.0000 meq | EXTENDED_RELEASE_TABLET | ORAL | Status: AC
Start: 2017-06-12 — End: 2017-06-12
  Administered 2017-06-12: 40 meq via ORAL
  Filled 2017-06-12: qty 2

## 2017-06-12 NOTE — Care Management Important Message (Signed)
Important Message  Patient Details  Name: Terry Martinez. MRN: [id number] Date of Birth: May 17, 1940   Medicare Important Message Given:  Yes    Barb Merino Yazan Gatling 06/12/2017, 1:57 PM

## 2017-06-12 NOTE — Progress Notes (Signed)
CCMD called informing this Rn that pt had 17 bts runs vtach. Serita Butcher NP paged ,made aware.

## 2017-06-12 NOTE — Progress Notes (Signed)
Pt with 17 beats NSVT, ordering stat BMP and Mg+ level.  He was asymptomatic.

## 2017-06-12 NOTE — Progress Notes (Addendum)
Progress Note  Patient Name: Terry Martinez. Date of Encounter: 06/12/2017  Primary Cardiologist: Dorris Carnes, MD   Subjective   Pt did well on second day of zaroxolyn. Labs pending. Patient feels much better today  Inpatient Medications    Scheduled Meds: . allopurinol  100 mg Oral Daily  . aspirin EC  81 mg Oral Daily  . cholecalciferol  5,000 Units Oral BH-q7a  . hydrALAZINE  12.5 mg Oral Q8H  . isosorbide mononitrate  15 mg Oral Daily  . metoprolol succinate  12.5 mg Oral Daily  . mometasone-formoterol  2 puff Inhalation BID  . potassium chloride  40 mEq Oral BID  . pravastatin  40 mg Oral q1800  . rivaroxaban  15 mg Oral Q supper  . sodium chloride flush  3 mL Intravenous Q12H  . sodium chloride flush  3 mL Intravenous Q12H  . vitamin C  500 mg Oral Daily   Continuous Infusions: . sodium chloride    . sodium chloride    . furosemide 120 mg (06/12/17 0600)   PRN Meds: sodium chloride, sodium chloride, acetaminophen, albuterol, docusate sodium, magnesium hydroxide, nitroGLYCERIN, ondansetron (ZOFRAN) IV, polyvinyl alcohol, sodium chloride flush, sodium chloride flush   Vital Signs    Vitals:   06/11/17 1948 06/11/17 2105 06/12/17 0502 06/12/17 0854  BP: (!) 128/50  136/63   Pulse: 63 65 67   Resp: 18 16 18    Temp: (!) 97.5 F (36.4 C)  (!) 97.4 F (36.3 C)   TempSrc: Oral  Oral   SpO2: 93% 94% 93% 90%  Weight:   (!) 300 lb 1.6 oz (136.1 kg)   Height:        Intake/Output Summary (Last 24 hours) at 06/12/2017 0938 Last data filed at 06/12/2017 0859 Gross per 24 hour  Intake 360 ml  Output 2525 ml  Net -2165 ml   Filed Weights   06/10/17 0700 06/11/17 0553 06/12/17 0502  Weight: (!) 301 lb 9.6 oz (136.8 kg) (!) 302 lb 9.6 oz (137.3 kg) (!) 300 lb 1.6 oz (136.1 kg)    Telemetry    sinus - Personally Reviewed  ECG    No new tracings - Personally Reviewed  Physical Exam   GEN: No acute distress.   Neck: ? + JVD Cardiac: irregular rhythm,  regular rate Respiratory: Clear to auscultation bilaterally. GI: Soft, nontender, non-distended  MS: + LE edema with chronic skin changes, right leg wrapped Neuro:  Nonfocal  Psych: Normal affect   Labs    Chemistry Recent Labs  Lab 06/09/17 0430 06/10/17 0707 06/11/17 0635  NA 137 137 135  K 2.9* 3.6 3.2*  CL 98* 98* 97*  CO2 27 26 27   GLUCOSE 102* 117* 98  BUN 42* 47* 51*  CREATININE 2.02* 2.23* 2.19*  CALCIUM 8.7* 9.1 9.1  GFRNONAA 30* 27* 28*  GFRAA 35* 31* 32*  ANIONGAP 12 13 11      Hematology Recent Labs  Lab 06/08/17 0750 06/09/17 0430 06/10/17 0707  WBC 3.8* 3.8* 4.7  RBC 2.88* 2.90* 2.95*  HGB 9.0* 9.0* 9.5*  HCT 29.1* 29.4* 30.2*  MCV 101.0* 101.4* 102.4*  MCH 31.3 31.0 32.2  MCHC 30.9 30.6 31.5  RDW 17.1* 17.2* 16.9*  PLT 114* 119* 148*    Cardiac EnzymesNo results for input(s): TROPONINI in the last 168 hours.  Recent Labs  Lab 06/06/17 1645  TROPIPOC 0.01     BNP Recent Labs  Lab 06/06/17 1506  BNP 855.4*  DDimer No results for input(s): DDIMER in the last 168 hours.   Radiology    No results found.  Cardiac Studies   Right heart cath 06/08/17: Findings:  RA = 17 RV = 48/14 PA = 48/16 (27) PCW = 17 (V waves 25-30) Fick cardiac output/index = 7.2/2.8 PVR = 1.4 WU Ao sat = 92% PA sat = 51%, 54%  Assessment: 1. 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 Minimally elevated PCWP with prominent v-waves suggestive of significant MR and/or diastolic dysfunction  Plan/Discussion:  There is near equalization of R &L end diastolic pressures. Suspect main issue is RV failure but cannot exclude component of restrictive CM.   Suggest weight loss and further evaluation with sleep study. Continue diuresis as renal function tolerates.   Patient Profile     77 y.o. 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 Acute on chronic systolic and diastolic heart failure with severe RV dysfunction - right heart cath this admission with RV failure - diuresing on 120 mg IV lasix BID - added zaroxolyn x 2 days in a row - he is overall net negative 7 L with 2.1 L urine output yesterday - weight is 300 lbs from 296 lbs on admission - standing weight - labs pending - will hold off on additional zaroxolyn today - follow renal function - pt states he feels much better today  Still with volme excess on exam  2. Atrial fibrillation - rate-controlled - continue xarelto and toprol - no bleeding problems  3. CAD - asymptomatic, stable  4. CKD - baseline 2.2-2.8 Cr 2.2   K low   Repleted   Repeat BMET at 6 PM     For questions or updates, please contact [place] Please consult [email or web address] for contact info under Cardiology/STEMI.      Signed, Tami Lin Duke, PA  06/12/2017, 9:38 AM     Pt seen and examined   I agree with findings of A Duke above   I have amended note. Pt continues to diurese   More comfortable     ON exam:  Neck:  Full  Cardiac Irreg irreg   No S3   Abd   Supple   Ext with 1+ edema  Chornic skin changes  Recomm:   Repeat labs later today   Hold on further Zaroxylyn for now    [employer]

## 2017-06-13 ENCOUNTER — Ambulatory Visit: Payer: Self-pay

## 2017-06-13 ENCOUNTER — Other Ambulatory Visit: Payer: Self-pay

## 2017-06-13 LAB — BASIC METABOLIC PANEL
ANION GAP: 13 (ref 5–15)
ANION GAP: 15 (ref 5–15)
BUN: 62 mg/dL — ABNORMAL HIGH (ref 6–20)
BUN: 66 mg/dL — ABNORMAL HIGH (ref 6–20)
CALCIUM: 9.1 mg/dL (ref 8.9–10.3)
CO2: 24 mmol/L (ref 22–32)
CO2: 27 mmol/L (ref 22–32)
Calcium: 8.9 mg/dL (ref 8.9–10.3)
Chloride: 100 mmol/L — ABNORMAL LOW (ref 101–111)
Chloride: 97 mmol/L — ABNORMAL LOW (ref 101–111)
Creatinine, Ser: 2.28 mg/dL — ABNORMAL HIGH (ref 0.61–1.24)
Creatinine, Ser: 2.4 mg/dL — ABNORMAL HIGH (ref 0.61–1.24)
GFR, EST AFRICAN AMERICAN: 30 mL/min — AB (ref >60)
GFR, EST AFRICAN AMERICAN: 29 mL/min — AB (ref >60)
GFR, EST NON AFRICAN AMERICAN: 26 mL/min — AB (ref >60)
GFR, EST NON AFRICAN AMERICAN: 25 mL/min — AB (ref >60)
Glucose, Bld: 118 mg/dL — ABNORMAL HIGH (ref 65–99)
Glucose, Bld: 121 mg/dL — ABNORMAL HIGH (ref 65–99)
Potassium: 3.2 mmol/L — ABNORMAL LOW (ref 3.5–5.1)
Potassium: 3.9 mmol/L (ref 3.5–5.1)
SODIUM: 139 mmol/L (ref 135–145)
Sodium: 137 mmol/L (ref 135–145)

## 2017-06-13 MED ORDER — METOLAZONE 5 MG PO TABS
5.0000 mg | ORAL_TABLET | Freq: Once | ORAL | Status: AC
  Administered 2017-06-13: 5 mg via ORAL
  Filled 2017-06-13: qty 1

## 2017-06-13 MED ORDER — POTASSIUM CHLORIDE CRYS ER 20 MEQ PO TBCR
40.0000 meq | EXTENDED_RELEASE_TABLET | Freq: Once | ORAL | Status: AC
  Administered 2017-06-13: 40 meq via ORAL
  Filled 2017-06-13: qty 2

## 2017-06-13 MED ORDER — POTASSIUM CHLORIDE CRYS ER 20 MEQ PO TBCR
60.0000 meq | EXTENDED_RELEASE_TABLET | Freq: Two times a day (BID) | ORAL | Status: DC
  Administered 2017-06-13 – 2017-06-17 (×8): 60 meq via ORAL
  Filled 2017-06-13 (×8): qty 3

## 2017-06-13 NOTE — Progress Notes (Addendum)
Progress Note  Patient Name: Terry Martinez. Date of Encounter: 06/13/2017  Primary Cardiologist: Dorris Carnes, MD   Subjective   Pt voiding well, still with fluid overload  Inpatient Medications    Scheduled Meds: . allopurinol  100 mg Oral Daily  . aspirin EC  81 mg Oral Daily  . cholecalciferol  5,000 Units Oral BH-q7a  . hydrALAZINE  12.5 mg Oral Q8H  . isosorbide mononitrate  15 mg Oral Daily  . metoprolol succinate  12.5 mg Oral Daily  . mometasone-formoterol  2 puff Inhalation BID  . potassium chloride  40 mEq Oral BID  . pravastatin  40 mg Oral q1800  . rivaroxaban  15 mg Oral Q supper  . sodium chloride flush  3 mL Intravenous Q12H  . sodium chloride flush  3 mL Intravenous Q12H  . vitamin C  500 mg Oral Daily   Continuous Infusions: . sodium chloride    . sodium chloride    . furosemide Stopped (06/13/17 0750)   PRN Meds: sodium chloride, sodium chloride, acetaminophen, albuterol, docusate sodium, magnesium hydroxide, nitroGLYCERIN, ondansetron (ZOFRAN) IV, polyvinyl alcohol, sodium chloride flush, sodium chloride flush   Vital Signs    Vitals:   06/13/17 0634 06/13/17 0652 06/13/17 0900 06/13/17 0939  BP:  (!) 154/62 (!) 129/58   Pulse:  67 75   Resp:      Temp:      TempSrc:      SpO2:    92%  Weight: 296 lb (134.3 kg)     Height:        Intake/Output Summary (Last 24 hours) at 06/13/2017 0944 Last data filed at 06/13/2017 7673 Gross per 24 hour  Intake 603 ml  Output 2385 ml  Net -1782 ml   Filed Weights   06/11/17 0553 06/12/17 0502 06/13/17 0634  Weight: (!) 302 lb 9.6 oz (137.3 kg) (!) 300 lb 1.6 oz (136.1 kg) 296 lb (134.3 kg)    Telemetry    Rate controlled Afib - Personally Reviewed  ECG    No new tracings - Personally Reviewed  Physical Exam   GEN: No acute distress.   Neck: No JVD Cardiac: irregular rhythm, regular rate  Respiratory: expiratory wheezes. GI: Soft, nontender, non-distended  MS: + LE edema with chronic  skin changes Neuro:  Nonfocal  Psych: Normal affect   Labs    Chemistry Recent Labs  Lab 06/12/17 1729 06/12/17 2341 06/13/17 0453  NA 138 139 137  K 3.0* 3.9 3.2*  CL 95* 100* 97*  CO2 29 24 27   GLUCOSE 118* 118* 121*  BUN 58* 66* 62*  CREATININE 2.43* 2.40* 2.28*  CALCIUM 9.1 9.1 8.9  GFRNONAA 24* 25* 26*  GFRAA 28* 29* 30*  ANIONGAP 14 15 13      Hematology Recent Labs  Lab 06/08/17 0750 06/09/17 0430 06/10/17 0707  WBC 3.8* 3.8* 4.7  RBC 2.88* 2.90* 2.95*  HGB 9.0* 9.0* 9.5*  HCT 29.1* 29.4* 30.2*  MCV 101.0* 101.4* 102.4*  MCH 31.3 31.0 32.2  MCHC 30.9 30.6 31.5  RDW 17.1* 17.2* 16.9*  PLT 114* 119* 148*    Cardiac EnzymesNo results for input(s): TROPONINI in the last 168 hours.  Recent Labs  Lab 06/06/17 1645  TROPIPOC 0.01     BNP Recent Labs  Lab 06/06/17 1506  BNP 855.4*     DDimer No results for input(s): DDIMER in the last 168 hours.   Radiology    No results found.  Cardiac Studies  Right heart cath 06/08/17: Findings:  RA = 17 RV = 48/14 PA = 48/16 (27) PCW = 17 (V waves 25-30) Fick cardiac output/index = 7.2/2.8 PVR = 1.4 WU Ao sat = 92% PA sat = 51%, 54%  Assessment: 1. 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 Minimally elevated PCWP with prominent v-waves suggestive of significant MR and/or diastolic dysfunction  Plan/Discussion: There is near equalization of R &L end diastolic pressures. Suspect main issue is RV failure but cannot exclude component of restrictive CM.   Suggest weight loss and further evaluation with sleep study. Continue diuresis as renal function tolerates.  Patient Profile     77 y.o. 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  Assessment &  Plan    1. Acute on chronic systolic and diastoic heart failure with sever RV dysfunction - Pt diuresed on 120 mg IV lasix BID yesterday without metolazone - he is overall net negative 9L with 2.5 L urine output yesterday - weight is 296 lbs, down from peak weight of 302 lbs this admission - pt states his dry weight, when he feels best. Is near 270 lbs - will give another dose of metolazone today and increase scheduled potassium supplement to 60 mEq BID  2. Atrial fibrillation - rate controlled on lopressor - continue xarelto - no bleeding problems  3. CAD - asymptomatic  4. CKD - baseline 2.2-2.8 - sCr 2.28 (2.40)    For questions or updates, please contact [place] Please consult [email or web address] for contact info under Cardiology/STEMI.      Signed, Tami Lin Duke, PA  06/13/2017, 9:44 AM    Pt seen and examined  I agree with findings as noted by A Duke above   Pt continues to diurese and exam improves  He is feeling some better Neck FUll  Lungs are CTA  Cardiac Irreg rate/rhythm  No S3  Abd  RUQ tenderness  Ext 1+ edema  Chrnoic stasis changes   I would cnotinue on IV lasix with Zarozylyn   Follow I/O and Cr.  Dorris Carnes

## 2017-06-14 ENCOUNTER — Ambulatory Visit: Payer: Medicare Other | Admitting: Family Medicine

## 2017-06-14 LAB — BASIC METABOLIC PANEL
ANION GAP: 12 (ref 5–15)
BUN: 68 mg/dL — ABNORMAL HIGH (ref 6–20)
CO2: 28 mmol/L (ref 22–32)
Calcium: 8.9 mg/dL (ref 8.9–10.3)
Chloride: 100 mmol/L — ABNORMAL LOW (ref 101–111)
Creatinine, Ser: 2.18 mg/dL — ABNORMAL HIGH (ref 0.61–1.24)
GFR, EST AFRICAN AMERICAN: 32 mL/min — AB (ref >60)
GFR, EST NON AFRICAN AMERICAN: 28 mL/min — AB (ref >60)
Glucose, Bld: 110 mg/dL — ABNORMAL HIGH (ref 65–99)
POTASSIUM: 3 mmol/L — AB (ref 3.5–5.1)
Sodium: 140 mmol/L (ref 135–145)

## 2017-06-14 MED ORDER — FUROSEMIDE 10 MG/ML IJ SOLN
120.0000 mg | Freq: Two times a day (BID) | INTRAVENOUS | Status: DC
  Administered 2017-06-14 – 2017-06-15 (×4): 120 mg via INTRAVENOUS
  Filled 2017-06-14: qty 10
  Filled 2017-06-14 (×2): qty 12
  Filled 2017-06-14 (×2): qty 10

## 2017-06-14 MED ORDER — POTASSIUM CHLORIDE CRYS ER 20 MEQ PO TBCR
40.0000 meq | EXTENDED_RELEASE_TABLET | Freq: Once | ORAL | Status: AC
  Administered 2017-06-14: 40 meq via ORAL
  Filled 2017-06-14: qty 2

## 2017-06-14 NOTE — Progress Notes (Signed)
Progress Note  Patient Name: Terry Martinez. Date of Encounter: 06/14/2017  Primary Cardiologist: Dorris Carnes, MD   Subjective   Breathing OK  No CP    Inpatient Medications    Scheduled Meds: . allopurinol  100 mg Oral Daily  . aspirin EC  81 mg Oral Daily  . cholecalciferol  5,000 Units Oral BH-q7a  . hydrALAZINE  12.5 mg Oral Q8H  . isosorbide mononitrate  15 mg Oral Daily  . metoprolol succinate  12.5 mg Oral Daily  . mometasone-formoterol  2 puff Inhalation BID  . potassium chloride  60 mEq Oral BID  . pravastatin  40 mg Oral q1800  . rivaroxaban  15 mg Oral Q supper  . sodium chloride flush  3 mL Intravenous Q12H  . sodium chloride flush  3 mL Intravenous Q12H  . vitamin C  500 mg Oral Daily   Continuous Infusions: . sodium chloride    . sodium chloride    . furosemide Stopped (06/14/17 0947)   PRN Meds: sodium chloride, sodium chloride, acetaminophen, albuterol, docusate sodium, magnesium hydroxide, nitroGLYCERIN, ondansetron (ZOFRAN) IV, polyvinyl alcohol, sodium chloride flush, sodium chloride flush   Vital Signs    Vitals:   06/14/17 0815 06/14/17 0847 06/14/17 1205 06/14/17 1300  BP:  (!) 141/98 (!) 132/53 131/69  Pulse:  76 62   Resp:      Temp:      TempSrc:      SpO2: 95%  92%   Weight:      Height:        Intake/Output Summary (Last 24 hours) at 06/14/2017 1358 Last data filed at 06/14/2017 1300 Gross per 24 hour  Intake 1200 ml  Output 2865 ml  Net -1665 ml   Net neg 11 L    Filed Weights   06/12/17 0502 06/13/17 0634 06/14/17 0611  Weight: (!) 300 lb 1.6 oz (136.1 kg) 296 lb (134.3 kg) 292 lb 3.2 oz (132.5 kg)    Telemetry    Afib  80s   - Personally Reviewed  ECG    No new tracings - Personally Reviewed  Physical Exam   GEN: No acute distress.  Laying in bed   Neck: No JVD Neck full   Cardiac: irregular rhythm, regular rate  Respiratory: CTA   GI: Soft, nontender, non-distended  MS: + LE edema with chronic skin  changes Neuro:  Nonfocal  Psych: Normal affect   Labs    Chemistry Recent Labs  Lab 06/12/17 2341 06/13/17 0453 06/14/17 0350  NA 139 137 140  K 3.9 3.2* 3.0*  CL 100* 97* 100*  CO2 24 27 28   GLUCOSE 118* 121* 110*  BUN 66* 62* 68*  CREATININE 2.40* 2.28* 2.18*  CALCIUM 9.1 8.9 8.9  GFRNONAA 25* 26* 28*  GFRAA 29* 30* 32*  ANIONGAP 15 13 12      Hematology Recent Labs  Lab 06/08/17 0750 06/09/17 0430 06/10/17 0707  WBC 3.8* 3.8* 4.7  RBC 2.88* 2.90* 2.95*  HGB 9.0* 9.0* 9.5*  HCT 29.1* 29.4* 30.2*  MCV 101.0* 101.4* 102.4*  MCH 31.3 31.0 32.2  MCHC 30.9 30.6 31.5  RDW 17.1* 17.2* 16.9*  PLT 114* 119* 148*    Cardiac EnzymesNo results for input(s): TROPONINI in the last 168 hours.  No results for input(s): TROPIPOC in the last 168 hours.   BNP No results for input(s): BNP, PROBNP in the last 168 hours.   DDimer No results for input(s): DDIMER in the last 168  hours.   Radiology    No results found.  Cardiac Studies   Right heart cath 06/08/17: Findings:  RA = 17 RV = 48/14 PA = 48/16 (27) PCW = 17 (V waves 25-30) Fick cardiac output/index = 7.2/2.8 PVR = 1.4 WU Ao sat = 92% PA sat = 51%, 54%  Assessment: 1. 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 Minimally elevated PCWP with prominent v-waves suggestive of significant MR and/or diastolic dysfunction  Plan/Discussion: There is near equalization of R &L end diastolic pressures. Suspect main issue is RV failure but cannot exclude component of restrictive CM.   Suggest weight loss and further evaluation with sleep study. Continue diuresis as renal function tolerates.  Patient Profile     77 y.o. 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  Assessment & Plan    1. Acute on chronic systolic and diastoic heart failure with sever RV dysfunction - Continues to diurese with IV lasix and Zaroxylyn   Would continued   Prob switch to PO tomorrow  Replete K     2. Atrial fibrillation - rate controlled on lopressor - continue xarelto - no bleeding problems  3. CAD - asymptomatic  4. CKD -  Cr 2.18  Stable   -    For questions or updates, please contact [place] Please consult [email or web address] for contact info under Cardiology/STEMI.      Signed, Dorris Carnes, MD  06/14/2017, 1:58 PM    Pt seen and examined  I agree with findings as noted by A Duke above   Pt continues to diurese and exam improves  He is feeling some better Neck FUll  Lungs are CTA  Cardiac Irreg rate/rhythm  No S3  Abd  RUQ tenderness  Ext 1+ edema  Chrnoic stasis changes   I would cnotinue on IV lasix with Zarozylyn   Follow I/O and Cr.  Dorris Carnes

## 2017-06-15 ENCOUNTER — Other Ambulatory Visit: Payer: Medicare Other

## 2017-06-15 DIAGNOSIS — N183 Chronic kidney disease, stage 3 (moderate): Secondary | ICD-10-CM

## 2017-06-15 DIAGNOSIS — I2510 Atherosclerotic heart disease of native coronary artery without angina pectoris: Secondary | ICD-10-CM

## 2017-06-15 LAB — BASIC METABOLIC PANEL
Anion gap: 13 (ref 5–15)
BUN: 74 mg/dL — AB (ref 6–20)
CO2: 27 mmol/L (ref 22–32)
CREATININE: 2.26 mg/dL — AB (ref 0.61–1.24)
Calcium: 8.9 mg/dL (ref 8.9–10.3)
Chloride: 98 mmol/L — ABNORMAL LOW (ref 101–111)
GFR calc Af Amer: 31 mL/min — ABNORMAL LOW (ref >60)
GFR, EST NON AFRICAN AMERICAN: 26 mL/min — AB (ref >60)
GLUCOSE: 112 mg/dL — AB (ref 65–99)
Potassium: 3.2 mmol/L — ABNORMAL LOW (ref 3.5–5.1)
Sodium: 138 mmol/L (ref 135–145)

## 2017-06-15 LAB — MAGNESIUM: Magnesium: 2.1 mg/dL (ref 1.7–2.4)

## 2017-06-15 MED ORDER — FUROSEMIDE 10 MG/ML IJ SOLN: 120.0000 mg | Freq: Every day | INTRAVENOUS | Status: DC

## 2017-06-15 MED ORDER — METOLAZONE 2.5 MG PO TABS
2.5000 mg | ORAL_TABLET | Freq: Every day | ORAL | Status: DC
  Administered 2017-06-15: 2.5 mg via ORAL
  Filled 2017-06-15: qty 1

## 2017-06-15 NOTE — Progress Notes (Signed)
With Cr bump  Will hold diuretics (lasix and zaroxolyn ) until am labs back Improving   Stil with edema

## 2017-06-15 NOTE — Progress Notes (Addendum)
Progress Note  Patient Name: Terry Martinez. Date of Encounter: 06/15/2017  Primary Cardiologist: Dorris Carnes, MD   Subjective   Breathing and edema improving. Walking well.   Inpatient Medications    Scheduled Meds: . allopurinol  100 mg Oral Daily  . cholecalciferol  5,000 Units Oral BH-q7a  . hydrALAZINE  12.5 mg Oral Q8H  . isosorbide mononitrate  15 mg Oral Daily  . metoprolol succinate  12.5 mg Oral Daily  . mometasone-formoterol  2 puff Inhalation BID  . potassium chloride  60 mEq Oral BID  . pravastatin  40 mg Oral q1800  . rivaroxaban  15 mg Oral Q supper  . sodium chloride flush  3 mL Intravenous Q12H  . sodium chloride flush  3 mL Intravenous Q12H  . vitamin C  500 mg Oral Daily   Continuous Infusions: . sodium chloride    . sodium chloride    . furosemide 120 mg (06/15/17 0825)   PRN Meds: sodium chloride, sodium chloride, acetaminophen, albuterol, docusate sodium, magnesium hydroxide, nitroGLYCERIN, ondansetron (ZOFRAN) IV, polyvinyl alcohol, sodium chloride flush, sodium chloride flush   Vital Signs    Vitals:   06/14/17 1945 06/14/17 2147 06/15/17 0453 06/15/17 0818  BP: 127/61 129/61 (!) 125/55 127/68  Pulse: 64  70 75  Resp: 18  18 20   Temp: 98.1 F (36.7 C)  99.3 F (37.4 C) 98 F (36.7 C)  TempSrc: Oral  Oral Oral  SpO2: 95%  94% 94%  Weight:   289 lb 12.8 oz (131.5 kg)   Height:        Intake/Output Summary (Last 24 hours) at 06/15/2017 0845 Last data filed at 06/15/2017 0817 Gross per 24 hour  Intake 1262 ml  Output 3646 ml  Net -2384 ml   Filed Weights   06/13/17 0634 06/14/17 0611 06/15/17 0453  Weight: 296 lb (134.3 kg) 292 lb 3.2 oz (132.5 kg) 289 lb 12.8 oz (131.5 kg)    Telemetry    afib at rate 70-80s- Personally Reviewed  ECG    N/A  Physical Exam   GEN: No acute distress.  Eating breakfast.  Neck: + JVD Cardiac: IR IR , no murmurs, rubs, or gallops.  Respiratory: Clear to auscultation bilaterally. GI: Soft,  nontender, non-distended  MS: 1 + BL edema with chronic venous statis Neuro:  Nonfocal  Psych: Normal affect   Labs    Chemistry Recent Labs  Lab 06/13/17 0453 06/14/17 0350 06/15/17 0508  NA 137 140 138  K 3.2* 3.0* 3.2*  CL 97* 100* 98*  CO2 27 28 27   GLUCOSE 121* 110* 112*  BUN 62* 68* 74*  CREATININE 2.28* 2.18* 2.26*  CALCIUM 8.9 8.9 8.9  GFRNONAA 26* 28* 26*  GFRAA 30* 32* 31*  ANIONGAP 13 12 13      Hematology Recent Labs  Lab 06/09/17 0430 06/10/17 0707  WBC 3.8* 4.7  RBC 2.90* 2.95*  HGB 9.0* 9.5*  HCT 29.4* 30.2*  MCV 101.4* 102.4*  MCH 31.0 32.2  MCHC 30.6 31.5  RDW 17.2* 16.9*  PLT 119* 148*    Radiology    No results found.  Cardiac Studies   RIGHT HEART CATH 06/08/17  Conclusion   Findings:  RA = 17 RV = 48/14 PA = 48/16 (27) PCW = 17 (V waves 25-30) Fick cardiac output/index = 7.2/2.8 PVR = 1.4 WU Ao sat = 92% PA sat = 51%, 54%  Assessment: 1. 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 Minimally elevated  PCWP with prominent v-waves suggestive of significant MR and/or diastolic dysfunction  Plan/Discussion:  There is near equalization of R & L end diastolic pressures. Suspect main issue is RV failure but cannot exclude component of restrictive CM.   Suggest weight loss and further evaluation with sleep study. Continue diuresis as renal function tolerates.   Glori Bickers, MD  10:13 AM    Patient Profile     77 y.o. 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  Assessment & Plan    1. 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 2.2L diuresis in last 24 hours. 3 lb weight loss overnight. Still significant volume overload. SCr stable.  Last dose of  Zaroxolyn 4/24 (consider daily dose). Currently on IV lasix 120mg  BID. Please review meds.  - Continue BB, imdur and hydralazine. No ACE/ARB dur to CKD.   2. Hypokalemia - K persistently ~3. Will increase Kdur to 60mg  TID instead BID dose. Follow closely.  Check MG  3. Persistent atrial fibrillation - Rate stable. Continue BB and Xeralto.   4. CKD stage III-IV - SCr stable between 2.2-2.4  5. CAD - No chest pain. Ambulating well. Not on ASA due to need of anticoagulation. Continue statin, BB and imdur.   6  Atrial fib   Rate control and Xarelto    For questions or updates, please contact [facility] Please consult [email or web address] for contact info under Cardiology/STEMI.      Jarrett Soho, PA  06/15/2017, 8:45 AM

## 2017-06-16 DIAGNOSIS — N184 Chronic kidney disease, stage 4 (severe): Secondary | ICD-10-CM

## 2017-06-16 DIAGNOSIS — N179 Acute kidney failure, unspecified: Secondary | ICD-10-CM

## 2017-06-16 LAB — BASIC METABOLIC PANEL
Anion gap: 12 (ref 5–15)
BUN: 81 mg/dL — AB (ref 6–20)
CO2: 29 mmol/L (ref 22–32)
CREATININE: 2.2 mg/dL — AB (ref 0.61–1.24)
Calcium: 8.8 mg/dL — ABNORMAL LOW (ref 8.9–10.3)
Chloride: 95 mmol/L — ABNORMAL LOW (ref 101–111)
GFR calc Af Amer: 32 mL/min — ABNORMAL LOW (ref >60)
GFR calc non Af Amer: 27 mL/min — ABNORMAL LOW (ref >60)
GLUCOSE: 102 mg/dL — AB (ref 65–99)
Potassium: 3.2 mmol/L — ABNORMAL LOW (ref 3.5–5.1)
SODIUM: 136 mmol/L (ref 135–145)

## 2017-06-16 MED ORDER — FUROSEMIDE 10 MG/ML IJ SOLN
80.0000 mg | Freq: Two times a day (BID) | INTRAMUSCULAR | Status: DC
  Administered 2017-06-16 – 2017-06-17 (×3): 80 mg via INTRAVENOUS
  Filled 2017-06-16 (×4): qty 8

## 2017-06-16 MED ORDER — METOLAZONE 2.5 MG PO TABS
2.5000 mg | ORAL_TABLET | Freq: Once | ORAL | Status: AC
  Administered 2017-06-16: 2.5 mg via ORAL
  Filled 2017-06-16: qty 1

## 2017-06-16 NOTE — Progress Notes (Signed)
Progress Note  Patient Name: Terry Martinez. Date of Encounter: 06/16/2017  Primary Cardiologist: Dorris Carnes, MD   Subjective   Breathing and edema improving. Walking well.   Inpatient Medications    Scheduled Meds: . allopurinol  100 mg Oral Daily  . cholecalciferol  5,000 Units Oral BH-q7a  . hydrALAZINE  12.5 mg Oral Q8H  . isosorbide mononitrate  15 mg Oral Daily  . metoprolol succinate  12.5 mg Oral Daily  . mometasone-formoterol  2 puff Inhalation BID  . potassium chloride  60 mEq Oral BID  . pravastatin  40 mg Oral q1800  . rivaroxaban  15 mg Oral Q supper  . sodium chloride flush  3 mL Intravenous Q12H  . sodium chloride flush  3 mL Intravenous Q12H  . vitamin C  500 mg Oral Daily   Continuous Infusions: . sodium chloride    . sodium chloride     PRN Meds: sodium chloride, sodium chloride, acetaminophen, albuterol, docusate sodium, magnesium hydroxide, nitroGLYCERIN, ondansetron (ZOFRAN) IV, polyvinyl alcohol, sodium chloride flush, sodium chloride flush   Vital Signs    Vitals:   06/15/17 2150 06/16/17 0608 06/16/17 0818 06/16/17 0821  BP: (!) 116/51 (!) 103/55  (!) 122/58  Pulse: 63 67  63  Resp: 18 20  14   Temp: 99 F (37.2 C) (!) 97.5 F (36.4 C)    TempSrc: Oral Oral    SpO2: 93% 91% 93% 92%  Weight:  289 lb 8 oz (131.3 kg)    Height:        Intake/Output Summary (Last 24 hours) at 06/16/2017 1019 Last data filed at 06/16/2017 0728 Gross per 24 hour  Intake 1182 ml  Output 1800 ml  Net -618 ml   Filed Weights   06/14/17 0611 06/15/17 0453 06/16/17 0608  Weight: 292 lb 3.2 oz (132.5 kg) 289 lb 12.8 oz (131.5 kg) 289 lb 8 oz (131.3 kg)    Telemetry    afib at rate 70-80s- Personally Reviewed  ECG    N/A  Physical Exam   GEN: No acute distress.  Eating breakfast.  Neck: + JVD Cardiac: IR IR , no murmurs, rubs, or gallops.  Respiratory: Clear to auscultation bilaterally. GI: Soft, nontender, non-distended  MS: 1 + BL edema with  chronic venous statis Neuro:  Nonfocal  Psych: Normal affect   Labs    Chemistry Recent Labs  Lab 06/14/17 0350 06/15/17 0508 06/16/17 0630  NA 140 138 136  K 3.0* 3.2* 3.2*  CL 100* 98* 95*  CO2 28 27 29   GLUCOSE 110* 112* 102*  BUN 68* 74* 81*  CREATININE 2.18* 2.26* 2.20*  CALCIUM 8.9 8.9 8.8*  GFRNONAA 28* 26* 27*  GFRAA 32* 31* 32*  ANIONGAP 12 13 12      Hematology Recent Labs  Lab 06/10/17 0707  WBC 4.7  RBC 2.95*  HGB 9.5*  HCT 30.2*  MCV 102.4*  MCH 32.2  MCHC 31.5  RDW 16.9*  PLT 148*    Radiology    No results found.  Cardiac Studies   RIGHT HEART CATH 06/08/17  Conclusion   Findings:  RA = 17 RV = 48/14 PA = 48/16 (27) PCW = 17 (V waves 25-30) Fick cardiac output/index = 7.2/2.8 PVR = 1.4 WU Ao sat = 92% PA sat = 51%, 54%  Assessment: 1. 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 Minimally elevated PCWP with prominent v-waves suggestive of significant MR and/or diastolic dysfunction  Plan/Discussion:  There is  near equalization of R & L end diastolic pressures. Suspect main issue is RV failure but cannot exclude component of restrictive CM.   Suggest weight loss and further evaluation with sleep study. Continue diuresis as renal function tolerates.   Glori Bickers, MD  10:13 AM    Patient Profile     77 y.o. 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  Assessment & Plan    1. 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 0.6L diuresis in last 24 hours. 2 lb weight loss overnight. Still significant volume overload. SCr stable at 2.2.  Lasix held yesterday, today weight 289 lbs, baseline 283 lbs - restart lasix 80 mg iv BID, add metolazone 2.5 mg x 1,  replace potassium - Continue BB, imdur and hydralazine. No ACE/ARB dur to CKD.   2. Hypokalemia - K persistently ~3. Will increase Kdur to 60mg  TID instead BID dose. Follow closely.  Check MG  3. Persistent atrial fibrillation - Rate stable. Continue BB and Xeralto.   4. CKD stage III-IV - SCr stable between 2.2-2.4  5. CAD - No chest pain. Ambulating well. Not on ASA due to need of anticoagulation. Continue statin, BB and imdur.   6  Atrial fib   Rate control and Xarelto    For questions or updates, please contact [facility] Please consult [email or web address] for contact info under Cardiology/STEMI.      Signed, Ena Dawley, MD  06/16/2017, 10:19 AM

## 2017-06-17 LAB — BASIC METABOLIC PANEL
Anion gap: 9 (ref 5–15)
BUN: 91 mg/dL — ABNORMAL HIGH (ref 6–20)
CO2: 31 mmol/L (ref 22–32)
Calcium: 8.8 mg/dL — ABNORMAL LOW (ref 8.9–10.3)
Chloride: 98 mmol/L — ABNORMAL LOW (ref 101–111)
Creatinine, Ser: 2.26 mg/dL — ABNORMAL HIGH (ref 0.61–1.24)
GFR calc Af Amer: 31 mL/min — ABNORMAL LOW (ref >60)
GFR calc non Af Amer: 26 mL/min — ABNORMAL LOW (ref >60)
Glucose, Bld: 105 mg/dL — ABNORMAL HIGH (ref 65–99)
Potassium: 3.3 mmol/L — ABNORMAL LOW (ref 3.5–5.1)
Sodium: 138 mmol/L (ref 135–145)

## 2017-06-17 MED ORDER — POTASSIUM CHLORIDE CRYS ER 20 MEQ PO TBCR
60.0000 meq | EXTENDED_RELEASE_TABLET | Freq: Three times a day (TID) | ORAL | Status: DC
  Administered 2017-06-17 – 2017-06-18 (×4): 60 meq via ORAL
  Filled 2017-06-17 (×4): qty 3

## 2017-06-17 MED ORDER — FUROSEMIDE 80 MG PO TABS
80.0000 mg | ORAL_TABLET | Freq: Two times a day (BID) | ORAL | Status: DC
  Administered 2017-06-17 – 2017-06-18 (×2): 80 mg via ORAL
  Filled 2017-06-17 (×2): qty 1

## 2017-06-17 NOTE — Progress Notes (Signed)
Progress Note  Patient Name: Terry Martinez. Date of Encounter: 06/17/2017  Primary Cardiologist: Dorris Carnes, MD   Subjective   Breathing and edema improving.  Inpatient Medications    Scheduled Meds: . allopurinol  100 mg Oral Daily  . cholecalciferol  5,000 Units Oral BH-q7a  . furosemide  80 mg Intravenous BID  . hydrALAZINE  12.5 mg Oral Q8H  . isosorbide mononitrate  15 mg Oral Daily  . metoprolol succinate  12.5 mg Oral Daily  . mometasone-formoterol  2 puff Inhalation BID  . potassium chloride  60 mEq Oral BID  . pravastatin  40 mg Oral q1800  . rivaroxaban  15 mg Oral Q supper  . sodium chloride flush  3 mL Intravenous Q12H  . sodium chloride flush  3 mL Intravenous Q12H  . vitamin C  500 mg Oral Daily   Continuous Infusions: . sodium chloride    . sodium chloride     PRN Meds: sodium chloride, sodium chloride, acetaminophen, albuterol, docusate sodium, magnesium hydroxide, nitroGLYCERIN, ondansetron (ZOFRAN) IV, polyvinyl alcohol, sodium chloride flush, sodium chloride flush   Vital Signs    Vitals:   06/16/17 2121 06/17/17 0559 06/17/17 0604 06/17/17 0857  BP: (!) 126/57 (!) 118/55    Pulse: 63 72    Resp:      Temp: 97.8 F (36.6 C) 97.6 F (36.4 C)    TempSrc: Oral Oral    SpO2: 94% 91%  92%  Weight:   287 lb 4.8 oz (130.3 kg)   Height:        Intake/Output Summary (Last 24 hours) at 06/17/2017 1014 Last data filed at 06/17/2017 0837 Gross per 24 hour  Intake 843 ml  Output 1552 ml  Net -709 ml   Filed Weights   06/15/17 0453 06/16/17 0608 06/17/17 0604  Weight: 289 lb 12.8 oz (131.5 kg) 289 lb 8 oz (131.3 kg) 287 lb 4.8 oz (130.3 kg)    Telemetry    afib at rate 70-80s- Personally Reviewed  ECG    N/A  Physical Exam   GEN: No acute distress.  Eating breakfast.  Neck: + JVD Cardiac: IR IR , no murmurs, rubs, or gallops.  Respiratory: Clear to auscultation bilaterally. GI: Soft, nontender, non-distended  MS: 1 + BL edema with  chronic venous statis Neuro:  Nonfocal  Psych: Normal affect   Labs    Chemistry Recent Labs  Lab 06/15/17 0508 06/16/17 0630 06/17/17 0513  NA 138 136 138  K 3.2* 3.2* 3.3*  CL 98* 95* 98*  CO2 27 29 31   GLUCOSE 112* 102* 105*  BUN 74* 81* 91*  CREATININE 2.26* 2.20* 2.26*  CALCIUM 8.9 8.8* 8.8*  GFRNONAA 26* 27* 26*  GFRAA 31* 32* 31*  ANIONGAP 13 12 9      Hematology No results for input(s): WBC, RBC, HGB, HCT, MCV, MCH, MCHC, RDW, PLT in the last 168 hours.  Radiology    No results found.  Cardiac Studies   RIGHT HEART CATH 06/08/17  Conclusion   Findings:  RA = 17 RV = 48/14 PA = 48/16 (27) PCW = 17 (V waves 25-30) Fick cardiac output/index = 7.2/2.8 PVR = 1.4 WU Ao sat = 92% PA sat = 51%, 54%  Assessment: 1. 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 Minimally elevated PCWP with prominent v-waves suggestive of significant MR and/or diastolic dysfunction  Plan/Discussion:  There is near equalization of R & L end diastolic pressures. Suspect main issue is RV  failure but cannot exclude component of restrictive CM.   Suggest weight loss and further evaluation with sleep study. Continue diuresis as renal function tolerates.   Glori Bickers, MD  10:13 AM    Patient Profile     77 y.o. 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  Assessment & Plan    1. 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 0.7L diuresis in last 24 hours. Residual mild overload, but BUN/crea uptrending, SCr stable at 2.2.  I will switch to PO lasix and d/c metolazone.  Today weight 289 lbs, baseline 287 lbs - Continue BB, imdur and hydralazine. No ACE/ARB dur to CKD.   2. Hypokalemia - K  persistently ~3. Will increase Kdur to 60mg  TID instead BID dose. Follow closely.  Check MG  3. Persistent atrial fibrillation - Rate stable. Continue BB and Xeralto.   4. CKD stage III-IV - SCr stable between 2.2-2.4  5. CAD - No chest pain. Ambulating well. Not on ASA due to need of anticoagulation. Continue statin, BB and imdur.   6  Atrial fib   Rate control and Xarelto    For questions or updates, please contact [facility] Please consult [email or web address] for contact info under Cardiology/STEMI.     Signed, Ena Dawley, MD  06/17/2017, 10:14 AM

## 2017-06-17 NOTE — Plan of Care (Signed)
  Problem: Coping: Goal: Level of anxiety will decrease Outcome: Completed/Met

## 2017-06-18 ENCOUNTER — Encounter (HOSPITAL_COMMUNITY): Payer: Self-pay | Admitting: Cardiology

## 2017-06-18 ENCOUNTER — Other Ambulatory Visit: Payer: Self-pay | Admitting: Cardiology

## 2017-06-18 DIAGNOSIS — E876 Hypokalemia: Secondary | ICD-10-CM

## 2017-06-18 DIAGNOSIS — I214 Non-ST elevation (NSTEMI) myocardial infarction: Secondary | ICD-10-CM

## 2017-06-18 DIAGNOSIS — I481 Persistent atrial fibrillation: Secondary | ICD-10-CM

## 2017-06-18 DIAGNOSIS — N171 Acute kidney failure with acute cortical necrosis: Secondary | ICD-10-CM

## 2017-06-18 DIAGNOSIS — N289 Disorder of kidney and ureter, unspecified: Secondary | ICD-10-CM

## 2017-06-18 DIAGNOSIS — I50810 Right heart failure, unspecified: Secondary | ICD-10-CM

## 2017-06-18 MED ORDER — ENSURE ENLIVE PO LIQD
237.0000 mL | Freq: Two times a day (BID) | ORAL | Status: DC
  Administered 2017-06-18: 237 mL via ORAL

## 2017-06-18 MED ORDER — SPIRONOLACTONE 25 MG PO TABS
25.0000 mg | ORAL_TABLET | Freq: Every day | ORAL | Status: DC
  Administered 2017-06-18: 25 mg via ORAL
  Filled 2017-06-18: qty 1

## 2017-06-18 MED ORDER — POTASSIUM CHLORIDE CRYS ER 20 MEQ PO TBCR: 60.0000 meq | EXTENDED_RELEASE_TABLET | Freq: Two times a day (BID) | ORAL | 3 refills | Status: AC

## 2017-06-18 MED ORDER — SPIRONOLACTONE 25 MG PO TABS: 25.0000 mg | ORAL_TABLET | Freq: Every day | ORAL | 2 refills | Status: AC

## 2017-06-18 NOTE — Progress Notes (Signed)
Patient is active with [facility] as prior to admission for HHRN and PT. B Rucha Wissinger RN,MHA,BSN [phone] 

## 2017-06-18 NOTE — Progress Notes (Signed)
Pt  And brother in law given dc instructions,sister gives meds but available for instructions, all questions entertained and appts  Given, pt dc home in stable condition

## 2017-06-18 NOTE — Progress Notes (Signed)
PT in to see pt and note in chart, paged cardiology to advise of pt request to dc and PT  Evaluation completed, pt anxious to go home

## 2017-06-18 NOTE — Progress Notes (Addendum)
Bariatric 3:1  ordered as requested; Aneta Mins [phone]

## 2017-06-18 NOTE — Discharge Summary (Signed)
Discharge Summary    Patient ID: Terry Martinez.,  MRN: [id number], DOB/AGE: 07-22-1940 77 y.o.  Admit date: 06/06/2017 Discharge date: 06/18/2017  Primary Care Provider: Alveda Martinez Primary Cardiologist: Terry Carnes, MD  Discharge Diagnoses    Principal Problem:   Acute on chronic heart failure [facility]) Active Problems:   PAD (peripheral artery disease) ([place])   HTN (hypertension)   Atrial fibrillation ([place])   HLD (hyperlipidemia)   CAD (coronary artery disease)   Chronic anticoagulation   PVD (peripheral vascular disease) (H[facility])   CKD (chronic kidney disease), stage III (H[facility])   Hypokalemia   Right heart failure (H[facility])   Non-ST elevation (NSTEMI) myocardial infarction ([place])  Allergies Allergies  Allergen Reactions  . Zocor [Simvastatin] Hives and Rash   Diagnostic Studies/Procedures    Right heart cath 06/08/17:  Findings:  RA = 17 RV = 48/14 PA = 48/16 (27) PCW = 17 (V waves 25-30) Fick cardiac output/index = 7.2/2.8 PVR = 1.4 WU Ao sat = 92% PA sat = 51%, 54%  Assessment: 1. 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 Minimally elevated PCWP with prominent v-waves suggestive of significant MR and/or diastolic dysfunction  Plan/Discussion:  There is near equalization of R & L end diastolic pressures. Suspect main issue is RV failure but cannot exclude component of restrictive CM.   Suggest weight loss and further evaluation with sleep study. Continue diuresis as renal function tolerates.   History of Present Illness     Terry Martinez. is a 77 y.o. male with a hx of coronary artery disease status post multiple stenting procedures (last PCI in 2014 with [place]), peripheral arterial disease, permanent atrial fibrillation (on Xarelto), chronic kidney disease stage III, biventricular heart failure, dilated aortic root, mild to moderate aortic insufficiency.   Pt [facility]17 with c/o progressive lower extremity swelling  and weight gain. He stated that his usual weight was approximately 273lbs however, on the morning of presentation his weight was up to 300 pounds. He was having progressive shortness of breath, both at rest and with minimal exertion. He reports feeling well approximately 4 to 6 weeks ago and able to walk with his walker freely. However, over the last 2 to 3 weeks he stated he has been having weight gain and increasing lower extremity edema. He denied having chest pain on admission. He was last hospitalized in 04/2017 with similar symptoms of dyspnea and weight gain. At that time his weight was up 20 pounds. He was diuresed from 294 pounds down to 291 pounds. His creatinine remained elevated at 2.69. At his last follow-up visit with Dr. Harrington Challenger his weight was 288lbs. At that time he was prescribed metolazone, however he was not able to get this filled.He was ultimately admitted for acute on chronic heart failure per Cardiology service.   Hospital Course    77 yo 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), CAD (multiple PCIs, last in 2014), permanent Afib, PAD s/p aorta-bifemoral bypass, AI, CKD stage III who presents to [facility] with worsening DOE, LE edema and acute on  chronic heart failure.  Problems and plans are outlined below: Principle problem: 1. Acute on chronic combined CHF: -Presented with worsening DOE and orthopnea for the past several weeks.  He has had 3 heart failure admissions in the last year. States he is compliant with medications and diet.  At this time he was started on Lasix 80 mg IV  twice daily.  Given his right heart failure and biatrial enlargement, along with his current heart failure admission plans were made to proceed with a right heart cath to evaluate hemodynamics in particular PA pressures/PRV and possibly cardiac MRI to evaluate infiltrative disease ([place]).  Patient is not an ideal candidate for Afib ablation (permanent Afib since at least  2009) or CRT-D (QRS 104, no LBBB).  -Right heart cath completed 06/08/2016 with evidence of mild PAH and RV dysfunction, minimally elevated PCWP with prominent V wave suggestive of significant MR and/or diastolic dysfunction.  Per cath note,  there is near equalization of right and left end-diastolic pressures. Suspect main issue is RV failure but cannot exclude component of restrictive CM. Recommendations were made for weight loss and further evaluation with sleep study. -Echocardiogram completed 01/26/2017 showed LVEF 35 to 40%, aortic root 49 mm, severely dilated RV and reduced RV EF, moderate to severe TR, moderate AI and mild left ear, PA peak pressure 45 mmHg -Weight, 288 pounds on 06/18/17, baseline is 287 pounds -Net I&O -13.6 L with 1.7 L diuresis in the last 24 hours prior to discharge.  Residual mild overload, but BUN/creatinine up trending. Medication plans on day of discharge: torsemide 60 mg PO twice daily, metolazone twice weekly, with the addition of Spironolactone 25 mg p.o. Daily.  -We will continue BB, Imdur and hydralazine.  No ACE/ARB secondary to CKD  Other problems: 2. Acute on chronic renal insufficiency stage III: -Baseline creatinine 1.5-1.6 in November and December 2018 -Peak creatinine 2.43.  Currently trending down. On day of discharge creatinine is 2.26  3. CAD s/p multiple PCI: -Patient continues to deny chest pain -Continuation current regime -Not on ASA   4. Permanent atrial fibrillation: -Continue BB, rate has been controlled   -Xarelto 15 mg PO daily   5. Peripheral atrial disease: -Status post aorto-bifemoral bypass. Followed by [facility]  6.  Hypokalemia: -K persistently approximately 3.0.  Will be discharged on KCl 60 mg p.o. twice daily schedule a lab follow-up on Thursday or Friday of this week.  Per Case Management notes, pt has Elrosa RN from previous admission who are following him.   Consultants: None   The patient has been seen and  examined by Dr. Meda Coffee who feels that he is stable and ready for discharge today, 06/18/17.  _____________  Discharge Vitals Blood pressure (!) 155/58, pulse 83, temperature 97.6 F (36.4 C), temperature source Oral, resp. rate 20, height 6\' 3"  (1.905 m), weight 288 lb 1.6 oz (130.7 kg), SpO2 95 %.  Filed Weights   06/16/17 0608 06/17/17 0604 06/18/17 0415  Weight: 289 lb 8 oz (131.3 kg) 287 lb 4.8 oz (130.3 kg) 288 lb 1.6 oz (130.7 kg)   Labs & Radiologic Studies    CBC No results for input(s): WBC, NEUTROABS, HGB, HCT, MCV, PLT in the last 72 hours. Basic Metabolic Panel Recent Labs    06/16/17 0630 06/17/17 0513  NA 136 138  K 3.2* 3.3*  CL 95* 98*  CO2 29 31  GLUCOSE 102* 105*  BUN 81* 91*  CREATININE 2.20* 2.26*  CALCIUM 8.8* 8.8*   _____________  Dg Chest 2 View  Result Date: 06/06/2017 CLINICAL DATA:  Progressive shortness of breath and chest pain for 5 days. History of CHF. EXAM: CHEST - 2 VIEW COMPARISON:  Chest radiograph May 08, 2017 FINDINGS: Stable cardiomegaly. Tortuous calcified aorta. Pulmonary vascular congestion. Small pleural effusions with bibasilar strandy densities. LEFT subclavian artery stent. No pneumothorax. Soft  tissue planes and included osseous structures are nonsuspicious. Mild degenerative change of the thoracolumbar junction. Surgical clip projects in LEFT upper chest. IMPRESSION: Stable cardiomegaly and pulmonary vascular congestion. Small pleural effusions. Bibasilar atelectasis. Aortic Atherosclerosis (ICD10-I70.0). Electronically Signed   By: Elon Alas M.D.   On: 06/06/2017 16:41   US Abdomen Limited  Result Date: 05/22/2017 CLINICAL DATA:  77 year old with chronic kidney disease. Patient was scheduled for a renal artery duplex. EXAM: ULTRASOUND ABDOMEN LIMITED COMPARISON:  Renal ultrasound 05/04/2017 FINDINGS: Renal artery duplex was not performed because the abdominal aorta could not be visualized due to bowel gas. IMPRESSION: Renal  artery duplex not performed because the abdominal aorta could not be visualized. Based on patient's chronic kidney disease, consider further characterization of the renal arteries with a non contrast MRA of the renals. Electronically Signed   By: Markus Daft M.D.   On: 05/22/2017 12:45   Disposition   Pt is being discharged home today in good condition.  Follow-up Plans & Appointments    Follow-up Information    Terry Reasons, MD Follow up.   Specialty:  Family Medicine Contact information: [place] [address] [place] Follow up.   Why:  They will do your home health care at your home as requested Contact information: [place] [address] [phone]        [place] [facility] Follow up on 06/22/2017.   Specialty:  Cardiology Why:  You will need to have your labs drawn on Friday. You can come anytime that the lab is open  Contact information: [address], [address] [place]       Consuelo Pandy, PA-C Follow up on 06/26/2017.   Specialties:  Cardiology, Radiology Why:  Your follow-up appointment is on 06/26/2017 at 11 AM with Ellen Henri, PA with Dr. Harrington Challenger.  Please arrive to your appointment at 10:45 AM. Contact information: [place]  [address] [phone]          Discharge Instructions    (HEART FAILURE PATIENTS) Call MD:  Anytime you have any of the following symptoms: 1) 3 pound weight gain in 24 hours or 5 pounds in 1 week 2) shortness of breath, with or without a dry hacking cough 3) swelling in the hands, feet or stomach 4) if you have to sleep on extra pillows at night in order to breathe.   Complete by:  As directed    Call MD for:  difficulty breathing, headache or visual disturbances   Complete by:  As directed    Call MD for:  persistant nausea and vomiting   Complete by:   As directed    Diet - low sodium heart healthy   Complete by:  As directed    Discharge instructions   Complete by:  As directed    If you notice any bleeding such as blood in stool, black tarry stools, blood in urine, nosebleeds or any other unusual bleeding, call your doctor immediately.  One of your heart tests showed weakness of the heart muscle. This may make you more susceptible to weight gain from fluid retention, which can lead to symptoms that we call heart failure. Please follow these special instructions:  1. Follow a low-salt diet - you are allowed no more than 2,000mg  of sodium per day. Watch your fluid intake. In  general, you should not be taking in more than 2 liters of fluid per day (no more than 8 glasses per day). This includes sources of water in foods like soup, coffee, tea, milk, etc. 2. Weigh yourself on the same scale at same time of day and keep a log. 3. Call your doctor: (Anytime you feel any of the following symptoms)  - 3lb weight gain overnight or 5lb within a few days - Shortness of breath, with or without a dry hacking cough  - Swelling in the hands, feet or stomach  - If you have to sleep on extra pillows at night in order to breathe   IT IS IMPORTANT TO LET YOUR DOCTOR KNOW EARLY ON IF YOU ARE HAVING SYMPTOMS SO WE CAN HELP YOU!   Increase activity slowly   Complete by:  As directed      Discharge Medications   Allergies as of 06/18/2017      Reactions   Zocor [simvastatin] Hives, Rash      Medication List    TAKE these medications   acetaminophen 500 MG tablet Commonly known as:  TYLENOL Take 1,000 mg by mouth every 8 (eight) hours as needed for moderate pain or headache.   albuterol 108 (90 Base) MCG/ACT inhaler Commonly known as:  PROVENTIL HFA;VENTOLIN HFA Inhale 2 puffs into the lungs every 6 (six) hours as needed for wheezing or shortness of breath.   albuterol 108 (90 Base) MCG/ACT inhaler Commonly known as:  VENTOLIN HFA Inhale 2  puffs into the lungs every 6 (six) hours as needed for wheezing or shortness of breath.   allopurinol 100 MG tablet Commonly known as:  ZYLOPRIM TAKE ONE TABLET BY MOUTH ONCE DAILY IN THE EVENING What changed:    how much to take  how to take this  when to take this  additional instructions   budesonide-formoterol 160-4.5 MCG/ACT inhaler Commonly known as:  SYMBICORT Inhale 2 puffs into the lungs 2 (two) times daily.   hydrALAZINE 25 MG tablet Commonly known as:  APRESOLINE Take 0.5 tablets (12.5 mg total) by mouth every 8 (eight) hours.   ipratropium-albuterol 0.5-2.5 (3) MG/3ML Soln Commonly known as:  DUONEB Take 3 mLs by nebulization every 6 (six) hours as needed (wheezing). Dx:J44.1   isosorbide mononitrate 30 MG 24 hr tablet Commonly known as:  IMDUR Take 0.5 tablets (15 mg total) by mouth daily.   LUBRICATING EYE DROPS OP Place 1 drop into both eyes 4 (four) times daily as needed (dry eyes).   metolazone 2.5 MG tablet Commonly known as:  ZAROXOLYN Take one tablet (2.5mg  total) by mouth twice weekly as needed for weight gain or swelling What changed:    how much to take  how to take this  when to take this  additional instructions   metoprolol succinate 25 MG 24 hr tablet Commonly known as:  TOPROL-XL Take 0.5 tablets (12.5 mg total) by mouth daily.   metroNIDAZOLE 1 % gel Commonly known as:  METROGEL Apply 1 application topically daily as needed (SKIN IRRITATION/ROSACEA.).   mometasone-formoterol 100-5 MCG/ACT Aero Commonly known as:  DULERA Inhale 2 puffs into the lungs 2 (two) times daily.   nitroGLYCERIN 0.4 MG SL tablet Commonly known as:  NITROSTAT Place 1 tablet (0.4 mg total) under the tongue every 5 (five) minutes as needed for chest pain.   polyethylene glycol powder powder Commonly known as:  GLYCOLAX/MIRALAX DISSOLVE 17G (1 CAPFUL) OF POWDER IN 8 OUNCES OF LIQUID AND DRINK 2 TIMES  PER DAY AS NEEDED FOR MILD CONSTIPATION What changed:   See the new instructions.   potassium chloride SA 20 MEQ tablet Commonly known as:  K-DUR,KLOR-CON Take 3 tablets (60 mEq total) by mouth 2 (two) times daily. What changed:    how much to take  how to take this  when to take this  additional instructions   pravastatin 40 MG tablet Commonly known as:  PRAVACHOL Take 1 tablet (40 mg total) by mouth daily at 6 PM.   Rivaroxaban 15 MG Tabs tablet Commonly known as:  XARELTO Take 1 tablet (15 mg total) by mouth daily with supper.   spironolactone 25 MG tablet Commonly known as:  ALDACTONE Take 1 tablet (25 mg total) by mouth daily. Start taking on:  06/19/2017   torsemide 20 MG tablet Commonly known as:  DEMADEX Take 3 tablets (60 mg total) by mouth 2 (two) times daily.   vitamin C 500 MG tablet Commonly known as:  ASCORBIC ACID Take 500 mg by mouth daily.   VITAMIN D-3 PO Take 1 tablet by mouth every morning.            Durable Medical Equipment  (From admission, onward)        Start     Ordered   06/18/17 1634  For home use only DME 3 n 1  Once    Comments:  Bariatric   06/18/17 1633       Outstanding Labs/Studies   Pt has lab appointment on Friday 06/20/17 for BMET  He also has a follow up appointment on 06/26/17 in our office  Duration of Discharge Encounter   Greater than 30 minutes including physician time.  Signed, [place], NP 06/18/2017, 5:26 PM

## 2017-06-18 NOTE — Progress Notes (Signed)
Progress Note  Patient Name: Terry Martinez. Date of Encounter: 06/18/2017  Primary Cardiologist: Dorris Carnes, MD   Subjective   Breathing and edema improving. He walked to bathroom today, not in the hallway.  Inpatient Medications    Scheduled Meds: . allopurinol  100 mg Oral Daily  . cholecalciferol  5,000 Units Oral BH-q7a  . feeding supplement (ENSURE ENLIVE)  237 mL Oral BID BM  . furosemide  80 mg Oral BID  . hydrALAZINE  12.5 mg Oral Q8H  . isosorbide mononitrate  15 mg Oral Daily  . metoprolol succinate  12.5 mg Oral Daily  . mometasone-formoterol  2 puff Inhalation BID  . potassium chloride  60 mEq Oral TID  . pravastatin  40 mg Oral q1800  . rivaroxaban  15 mg Oral Q supper  . sodium chloride flush  3 mL Intravenous Q12H  . sodium chloride flush  3 mL Intravenous Q12H  . vitamin C  500 mg Oral Daily   Continuous Infusions: . sodium chloride    . sodium chloride     PRN Meds: sodium chloride, sodium chloride, acetaminophen, albuterol, docusate sodium, magnesium hydroxide, nitroGLYCERIN, ondansetron (ZOFRAN) IV, polyvinyl alcohol, sodium chloride flush, sodium chloride flush   Vital Signs    Vitals:   06/17/17 2020 06/18/17 0415 06/18/17 0744 06/18/17 0900  BP:  (!) 122/55  115/62  Pulse:  60  60  Resp:  20    Temp:  97.7 F (36.5 C)    TempSrc:  Oral    SpO2: 92% 91% 92% 93%  Weight:  288 lb 1.6 oz (130.7 kg)    Height:        Intake/Output Summary (Last 24 hours) at 06/18/2017 1149 Last data filed at 06/18/2017 0944 Gross per 24 hour  Intake 1123 ml  Output 2875 ml  Net -1752 ml   Filed Weights   06/16/17 0608 06/17/17 0604 06/18/17 0415  Weight: 289 lb 8 oz (131.3 kg) 287 lb 4.8 oz (130.3 kg) 288 lb 1.6 oz (130.7 kg)   Telemetry    afib at rate 70-80s- Personally Reviewed  ECG    N/A  Physical Exam   GEN: No acute distress.  Eating breakfast.  Neck: + JVD Cardiac: IR IR , no murmurs, rubs, or gallops.  Respiratory: Clear to  auscultation bilaterally. GI: Soft, nontender, non-distended  MS: 1 + BL edema with chronic venous statis Neuro:  Nonfocal  Psych: Normal affect   Labs    Chemistry Recent Labs  Lab 06/15/17 0508 06/16/17 0630 06/17/17 0513  NA 138 136 138  K 3.2* 3.2* 3.3*  CL 98* 95* 98*  CO2 27 29 31   GLUCOSE 112* 102* 105*  BUN 74* 81* 91*  CREATININE 2.26* 2.20* 2.26*  CALCIUM 8.9 8.8* 8.8*  GFRNONAA 26* 27* 26*  GFRAA 31* 32* 31*  ANIONGAP 13 12 9     Hematology No results for input(s): WBC, RBC, HGB, HCT, MCV, MCH, MCHC, RDW, PLT in the last 168 hours.  Radiology    No results found.  Cardiac Studies   RIGHT HEART CATH 06/08/17  Conclusion   Findings:  RA = 17 RV = 48/14 PA = 48/16 (27) PCW = 17 (V waves 25-30) Fick cardiac output/index = 7.2/2.8 PVR = 1.4 WU Ao sat = 92% PA sat = 51%, 54%  Assessment: 1. 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 Minimally elevated PCWP with prominent v-waves suggestive of significant MR and/or diastolic dysfunction  Plan/Discussion:  There  is near equalization of R & L end diastolic pressures. Suspect main issue is RV failure but cannot exclude component of restrictive CM.   Suggest weight loss and further evaluation with sleep study. Continue diuresis as renal function tolerates.   Glori Bickers, MD  10:13 AM    Patient Profile     77 y.o. 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  Assessment & Plan    1. Acute on chronic combined CHF with RV dysfunction - Net I & O -13.6 L, 1.7L diuresis in last 24 hours. Residual mild overload, but BUN/crea uptrending, the patient is minimally mobile, deconditioned with heart and kidney failure, I believe that he will soon need to be  placed in a facility or palliative care. - I would switch back to home Torsemide 60 mg po BID, metolazone twice weekly, and add spironolactone 25 mg po daily. .  Today weight 288 lbs, baseline 287 lbs - Continue BB, imdur and hydralazine. No ACE/ARB dur to CKD.   2. Hypokalemia - K persistently ~3. Will discharge on KCl 60 mg PO BID and schedule a lab follow up on Thursday or Friday this week.  3. Persistent atrial fibrillation - Rate stable. Continue BB and Xeralto.   4. CKD stage III-IV - SCr stable between 2.2-2.4  5. CAD - No chest pain. Ambulating well. Not on ASA due to need of anticoagulation. Continue statin, BB and imdur.   6  Atrial fib   Rate control and Xarelto    For questions or updates, please contact [facility] Please consult [email or web address] for contact info under Cardiology/STEMI.     Signed, Ena Dawley, MD  06/18/2017, 11:49 AM

## 2017-06-18 NOTE — Evaluation (Signed)
Physical Therapy Evaluation & Discharge Patient Details Name: Terry Martinez. MRN: [id number] DOB: [birth year] Today's Date: 06/18/2017   History of Present Illness  77 y.o. 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), CAD (multiple PCIs, last in 2014), permanent Afib, PAD s/p aorta-bifemoral bypass, permanent atrial fibrillation, AI, CKD stage III who presents with worsening DOE, LE edema and acute on chronic heart failure  Clinical Impression  Patient presents with generalized weakness due to chronic illness and hospitalization.  Feel he is stable to d/c home with family support and to resume HHPT.  Feel he continues at risk for falls and reviewed fall prevention with footwear, walker use, slowly rising, and use of lighting and clear pathway to bathroom.  Also feel a wide 3:1 may help as reports some difficulty despite handicapped toilet.  PT to continue at home will d/c acute PT as likely pt d/c home today.    Follow Up Recommendations Home health PT;Supervision - Intermittent    Equipment Recommendations  3in1 (PT)(bariatric)    Recommendations for Other Services       Precautions / Restrictions Precautions Precautions: Fall Precaution Comments: reliant on walker      Mobility  Bed Mobility Overal bed mobility: Modified Independent             General bed mobility comments: increased time, used UE's to pull up  Transfers Overall transfer level: Needs assistance Equipment used: 4-wheeled walker Transfers: Sit to/from Stand Sit to Stand: Supervision;From elevated surface         General transfer comment: increased time to rise with UE support  Ambulation/Gait Ambulation/Gait assistance: Supervision Ambulation Distance (Feet): 90 Feet(x 2) Assistive device: 4-wheeled walker Gait Pattern/deviations: Step-through pattern;Decreased stride length;Trunk flexed;Wide base of support;Shuffle     General Gait Details: cues for picking  up his feet due to shuffling pattern, sat to rest in hallway to check SpO2 93% on RA  Stairs            Wheelchair Mobility    Modified Rankin (Stroke Patients Only)       Balance Overall balance assessment: Needs assistance   Sitting balance-Leahy Scale: Good       Standing balance-Leahy Scale: Poor Standing balance comment: UE support for balance                             Pertinent Vitals/Pain Pain Assessment: No/denies pain    Home Living Family/patient expects to be discharged to:: Private residence Living Arrangements: Other relatives Available Help at Discharge: Family;Available 24 hours/day Type of Home: House Home Access: Stairs to enter Entrance Stairs-Rails: Left;Right;Can reach both Entrance Stairs-Number of Steps: 1 Home Layout: One level Home Equipment: Walker - 4 wheels;Shower seat;Cane - single point Additional Comments: lives in the back part of his sister's home    Prior Function Level of Independence: Independent with assistive device(s)         Comments: doesn't drive, sister and her husband assist with driving; wears phone around his neck     Hand Dominance   Dominant Hand: Right    Extremity/Trunk Assessment   Upper Extremity Assessment Upper Extremity Assessment: LUE deficits/detail LUE Deficits / Details: shoulder flexion limited to about 90 degrees, strength grossly 4/5    Lower Extremity Assessment Lower Extremity Assessment: RLE deficits/detail;LLE deficits/detail RLE Deficits / Details: missing first two or three toes including great toe, strength hip flexion 3+/5, knee extension 4/5, ankle DF  4-/5 LLE Deficits / Details: missing 4th toe, strength hip flexion 4-/5, knee extension 4/5, ankle DF 4/5       Communication   Communication: No difficulties  Cognition Arousal/Alertness: Awake/alert Behavior During Therapy: WFL for tasks assessed/performed Overall Cognitive Status: Within Functional Limits for  tasks assessed                                        General Comments General comments (skin integrity, edema, etc.): noted significant LE and UE staining and ecchymosis    Exercises     Assessment/Plan    PT Assessment All further PT needs can be met in the next venue of care  PT Problem List         PT Treatment Interventions      PT Goals (Current goals can be found in the Care Plan section)  Acute Rehab PT Goals PT Goal Formulation: All assessment and education complete, DC therapy    Frequency     Barriers to discharge        Co-evaluation               AM-PAC PT "6 Clicks" Daily Activity  Outcome Measure Difficulty turning over in bed (including adjusting bedclothes, sheets and blankets)?: A Lot Difficulty moving from lying on back to sitting on the side of the bed? : A Lot Difficulty sitting down on and standing up from a chair with arms (e.g., wheelchair, bedside commode, etc,.)?: A Lot Help needed moving to and from a bed to chair (including a wheelchair)?: A Little Help needed walking in hospital room?: A Little Help needed climbing 3-5 steps with a railing? : A Little 6 Click Score: 15    End of Session Equipment Utilized During Treatment: Gait belt Activity Tolerance: Patient tolerated treatment well Patient left: in bed;with call bell/phone within reach   PT Visit Diagnosis: Other abnormalities of gait and mobility (R26.89);Muscle weakness (generalized) (M62.81)    Time: 8099-8338 PT Time Calculation (min) (ACUTE ONLY): 32 min   Charges:   PT Evaluation $PT Eval Moderate Complexity: 1 Mod PT Treatments $Gait Training: 8-22 mins   PT G CodesMagda Kiel, [place] [phone] 06/18/2017   Reginia Naas 06/18/2017, 4:47 PM

## 2017-06-20 ENCOUNTER — Ambulatory Visit (HOSPITAL_COMMUNITY)
Admit: 2017-06-20 | Discharge: 2017-06-20 | Disposition: A | Discharge status: Home / Self Care | Payer: Medicare Other | Attending: Vascular Surgery | Admitting: Vascular Surgery

## 2017-06-20 ENCOUNTER — Other Ambulatory Visit: Payer: Self-pay

## 2017-06-20 ENCOUNTER — Encounter: Payer: Self-pay | Admitting: Physician Assistant

## 2017-06-20 ENCOUNTER — Ambulatory Visit (INDEPENDENT_AMBULATORY_CARE_PROVIDER_SITE_OTHER)
Admit: 2017-06-20 | Discharge: 2017-06-20 | Disposition: A | Discharge status: Home / Self Care | Payer: Medicare Other | Attending: Vascular Surgery | Admitting: Vascular Surgery

## 2017-06-20 ENCOUNTER — Ambulatory Visit (INDEPENDENT_AMBULATORY_CARE_PROVIDER_SITE_OTHER): Payer: Medicare Other | Admitting: Physician Assistant

## 2017-06-20 VITALS — BP 140/78 | HR 68 | Temp 97.1°F | Resp 16 | Ht 75.0 in | Wt 287.6 lb

## 2017-06-20 DIAGNOSIS — Z95828 Presence of other vascular implants and grafts: Secondary | ICD-10-CM | POA: Diagnosis not present

## 2017-06-20 DIAGNOSIS — I739 Peripheral vascular disease, unspecified: Secondary | ICD-10-CM

## 2017-06-20 DIAGNOSIS — I872 Venous insufficiency (chronic) (peripheral): Secondary | ICD-10-CM

## 2017-06-20 DIAGNOSIS — I2510 Atherosclerotic heart disease of native coronary artery without angina pectoris: Secondary | ICD-10-CM
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
## 2017-06-20 NOTE — Patient Outreach (Signed)
Transition of care: Admission date of 06/06/2017 Discharge date of 06/18/2017  Placed call to patient and spoke with sister who was driving to take patient to MD office.   Sister reports that patient is doing well. Reports he is very weak and continues to have swelling in legs but he is much improved.  Sister reports discharge weight of 288 and todays weight of 282.6.   Patient will return to MD office on Friday for labs.  Patient has follow up planned with primary MD on 06/26/2017.  Sister to drive.  PLAN: reviewed importance of heart failure plan, low salt diet, daily weights and taking all medications as prescribed. Offered home visit and sister has accepted for 06/27/2017. Again provided my contact information as sister was driving.  Tomasa Rand, RN, BSN, CEN [facility] [employer] [phone]

## 2017-06-22 ENCOUNTER — Other Ambulatory Visit: Payer: Medicare Other

## 2017-06-22 DIAGNOSIS — D631 Anemia in chronic kidney disease: Secondary | ICD-10-CM | POA: Diagnosis not present

## 2017-06-22 DIAGNOSIS — J441 Chronic obstructive pulmonary disease with (acute) exacerbation: Secondary | ICD-10-CM | POA: Diagnosis not present

## 2017-06-22 DIAGNOSIS — I130 Hypertensive heart and chronic kidney disease with heart failure and stage 1 through stage 4 chronic kidney disease, or unspecified chronic kidney disease: Secondary | ICD-10-CM | POA: Diagnosis not present

## 2017-06-22 DIAGNOSIS — N183 Chronic kidney disease, stage 3 (moderate): Secondary | ICD-10-CM | POA: Diagnosis not present

## 2017-06-22 DIAGNOSIS — I5043 Acute on chronic combined systolic (congestive) and diastolic (congestive) heart failure: Secondary | ICD-10-CM | POA: Diagnosis not present

## 2017-06-22 DIAGNOSIS — I2510 Atherosclerotic heart disease of native coronary artery without angina pectoris: Secondary | ICD-10-CM | POA: Diagnosis not present

## 2017-06-25 ENCOUNTER — Telehealth: Payer: Self-pay

## 2017-06-25 ENCOUNTER — Telehealth: Payer: Self-pay | Admitting: Internal Medicine

## 2017-06-25 DIAGNOSIS — N183 Chronic kidney disease, stage 3 (moderate): Secondary | ICD-10-CM | POA: Diagnosis not present

## 2017-06-25 DIAGNOSIS — Z6835 Body mass index (BMI) 35.0-35.9, adult: Secondary | ICD-10-CM | POA: Diagnosis not present

## 2017-06-25 DIAGNOSIS — I482 Chronic atrial fibrillation: Secondary | ICD-10-CM | POA: Diagnosis not present

## 2017-06-25 DIAGNOSIS — I130 Hypertensive heart and chronic kidney disease with heart failure and stage 1 through stage 4 chronic kidney disease, or unspecified chronic kidney disease: Secondary | ICD-10-CM | POA: Diagnosis not present

## 2017-06-25 DIAGNOSIS — R269 Unspecified abnormalities of gait and mobility: Secondary | ICD-10-CM | POA: Diagnosis not present

## 2017-06-25 DIAGNOSIS — R58 Hemorrhage, not elsewhere classified: Secondary | ICD-10-CM | POA: Diagnosis not present

## 2017-06-25 DIAGNOSIS — K2930 Chronic superficial gastritis without bleeding: Secondary | ICD-10-CM | POA: Diagnosis not present

## 2017-06-25 DIAGNOSIS — J449 Chronic obstructive pulmonary disease, unspecified: Secondary | ICD-10-CM | POA: Diagnosis not present

## 2017-06-25 DIAGNOSIS — N184 Chronic kidney disease, stage 4 (severe): Secondary | ICD-10-CM | POA: Diagnosis not present

## 2017-06-25 DIAGNOSIS — K625 Hemorrhage of anus and rectum: Secondary | ICD-10-CM | POA: Diagnosis not present

## 2017-06-25 DIAGNOSIS — I5042 Chronic combined systolic (congestive) and diastolic (congestive) heart failure: Secondary | ICD-10-CM | POA: Diagnosis not present

## 2017-06-25 DIAGNOSIS — K922 Gastrointestinal hemorrhage, unspecified: Secondary | ICD-10-CM | POA: Diagnosis not present

## 2017-06-25 DIAGNOSIS — Z7901 Long term (current) use of anticoagulants: Secondary | ICD-10-CM | POA: Diagnosis not present

## 2017-06-25 DIAGNOSIS — R791 Abnormal coagulation profile: Secondary | ICD-10-CM | POA: Diagnosis not present

## 2017-06-25 DIAGNOSIS — I2510 Atherosclerotic heart disease of native coronary artery without angina pectoris: Secondary | ICD-10-CM | POA: Diagnosis not present

## 2017-06-25 DIAGNOSIS — K648 Other hemorrhoids: Secondary | ICD-10-CM | POA: Diagnosis not present

## 2017-06-25 DIAGNOSIS — K2950 Unspecified chronic gastritis without bleeding: Secondary | ICD-10-CM | POA: Diagnosis not present

## 2017-06-25 DIAGNOSIS — Z87891 Personal history of nicotine dependence: Secondary | ICD-10-CM | POA: Diagnosis not present

## 2017-06-25 DIAGNOSIS — I252 Old myocardial infarction: Secondary | ICD-10-CM | POA: Diagnosis not present

## 2017-06-25 DIAGNOSIS — N189 Chronic kidney disease, unspecified: Secondary | ICD-10-CM | POA: Diagnosis not present

## 2017-06-25 DIAGNOSIS — R9431 Abnormal electrocardiogram [ECG] [EKG]: Secondary | ICD-10-CM | POA: Diagnosis not present

## 2017-06-25 DIAGNOSIS — I50813 Acute on chronic right heart failure: Secondary | ICD-10-CM | POA: Diagnosis not present

## 2017-06-25 DIAGNOSIS — I214 Non-ST elevation (NSTEMI) myocardial infarction: Secondary | ICD-10-CM | POA: Diagnosis not present

## 2017-06-25 DIAGNOSIS — Z955 Presence of coronary angioplasty implant and graft: Secondary | ICD-10-CM | POA: Diagnosis not present

## 2017-06-25 DIAGNOSIS — Z86010 Personal history of colonic polyps: Secondary | ICD-10-CM | POA: Diagnosis not present

## 2017-06-25 DIAGNOSIS — R531 Weakness: Secondary | ICD-10-CM | POA: Diagnosis not present

## 2017-06-25 DIAGNOSIS — I5082 Biventricular heart failure: Secondary | ICD-10-CM | POA: Diagnosis present

## 2017-06-25 DIAGNOSIS — I4891 Unspecified atrial fibrillation: Secondary | ICD-10-CM | POA: Diagnosis not present

## 2017-06-25 DIAGNOSIS — D539 Nutritional anemia, unspecified: Secondary | ICD-10-CM | POA: Diagnosis not present

## 2017-06-25 DIAGNOSIS — I493 Ventricular premature depolarization: Secondary | ICD-10-CM | POA: Diagnosis not present

## 2017-06-25 DIAGNOSIS — E669 Obesity, unspecified: Secondary | ICD-10-CM | POA: Diagnosis not present

## 2017-06-25 DIAGNOSIS — K921 Melena: Secondary | ICD-10-CM | POA: Diagnosis not present

## 2017-06-25 DIAGNOSIS — I739 Peripheral vascular disease, unspecified: Secondary | ICD-10-CM | POA: Diagnosis not present

## 2017-06-25 DIAGNOSIS — D649 Anemia, unspecified: Secondary | ICD-10-CM | POA: Diagnosis not present

## 2017-06-25 NOTE — Telephone Encounter (Signed)
New Message:      Amber calling from [facility] to confirm that Dr. Harrington Challenger will be the one signing the orders for this pt. Pls fax previous orders to [phone]

## 2017-06-25 NOTE — Telephone Encounter (Signed)
[place] Navigator with [facility] calling for orders to see patient for a SW consult. Call back [phone] Wallace Cullens, RN

## 2017-06-26 ENCOUNTER — Other Ambulatory Visit: Payer: Self-pay

## 2017-06-26 ENCOUNTER — Other Ambulatory Visit: Payer: Medicare Other

## 2017-06-26 ENCOUNTER — Ambulatory Visit: Payer: Medicare Other | Admitting: Family Medicine

## 2017-06-26 ENCOUNTER — Ambulatory Visit: Payer: Medicare Other | Admitting: Cardiology

## 2017-06-26 MED ORDER — METOPROLOL SUCCINATE ER 25 MG PO TB24
12.50 | ORAL_TABLET | ORAL | Status: DC
Start: 2017-06-29 — End: 2017-06-26

## 2017-06-26 MED ORDER — ALLOPURINOL 100 MG PO TABS
100.00 | ORAL_TABLET | ORAL | Status: DC
Start: 2017-06-28 — End: 2017-06-26

## 2017-06-26 MED ORDER — TORSEMIDE 10 MG PO TABS
60.00 | ORAL_TABLET | ORAL | Status: DC
Start: 2017-06-28 — End: 2017-06-26

## 2017-06-26 MED ORDER — SPIRONOLACTONE 25 MG PO TABS
25.00 | ORAL_TABLET | ORAL | Status: DC
Start: 2017-06-29 — End: 2017-06-26

## 2017-06-26 MED ORDER — PEG 3350 17 GM/SCOOP PO POWD
238.00 g | ORAL | Status: DC
Start: 2017-06-26 — End: 2017-06-26

## 2017-06-26 MED ORDER — FLUTICASONE FUROATE-VILANTEROL 100-25 MCG/INH IN AEPB
1.00 | INHALATION_SPRAY | RESPIRATORY_TRACT | Status: DC
Start: 2017-06-29 — End: 2017-06-26

## 2017-06-26 MED ORDER — ISOSORBIDE MONONITRATE ER 30 MG PO TB24
15.00 | ORAL_TABLET | ORAL | Status: DC
Start: 2017-06-29 — End: 2017-06-26

## 2017-06-26 MED ORDER — HYDRALAZINE HCL 25 MG PO TABS
12.50 | ORAL_TABLET | ORAL | Status: DC
Start: 2017-06-28 — End: 2017-06-26

## 2017-06-26 MED ORDER — ALBUTEROL SULFATE HFA 108 (90 BASE) MCG/ACT IN AERS
2.00 | INHALATION_SPRAY | RESPIRATORY_TRACT | Status: DC
Start: ? — End: 2017-06-26

## 2017-06-26 NOTE — Patient Outreach (Signed)
Care coordination: Incoming call from [facility] social worker, Eduard Clos who states she got a call from patients sister who states that patient is [facility] for a GI bleed.   PLAN: Appointment cancelled for home visit tomorrow.  Tomasa Rand, RN, BSN, CEN [facility] [employer] [phone]

## 2017-06-26 NOTE — Telephone Encounter (Signed)
Please give verbal okay for SW consult.  Thanks!  JW

## 2017-06-26 NOTE — Telephone Encounter (Signed)
Verbal given for this. Katharina Caper, April D, [place]

## 2017-06-27 ENCOUNTER — Ambulatory Visit: Payer: Self-pay

## 2017-06-28 ENCOUNTER — Telehealth: Payer: Self-pay | Admitting: Gastroenterology

## 2017-06-28 ENCOUNTER — Telehealth: Payer: Self-pay | Admitting: Internal Medicine

## 2017-06-28 MED ORDER — SODIUM CHLORIDE 0.9 % IJ SOLN
5.00 | INTRAMUSCULAR | Status: DC
Start: 2017-06-28 — End: 2017-06-28

## 2017-06-28 NOTE — Telephone Encounter (Signed)
Pt sister calling for rn   Pt c/o swelling: STAT is pt has developed SOB within 24 hours  1) How much weight have you gained and in what time span? 10 pounds, 06/25/2017  2) If swelling, where is the swelling located? Legs and stomach   3) Are you currently taking a fluid pill? yes  4) Are you currently SOB? yes  5) Do you have a log of your daily weights (if so, list)? 280-290  6) Have you gained 3 pounds in a day or 5 pounds in a week?  10 pounds in 4 days  7) Have you traveled recently? no, pt recently hospitalized in [place]

## 2017-06-28 NOTE — Telephone Encounter (Signed)
Wife states pt was admitted to [facility] hospital with rectal bleeding. Colon and EGD were done yesterday. Pt was on blood thinners. Pt was discharged today and told to follow-up with his GI doctor and that he may need a capsule endo to examine the small bowel. Pt scheduled to see Tye Savoy NP 07/04/17@2 :30pm. Pt aware of appt.

## 2017-06-28 NOTE — Telephone Encounter (Addendum)
Sunday pt fell. Was weak. Puddle of blood on bed. Ems came. Pt told them he bumped himself with urinal and so they didn't take him to hospital.    Monday blood on floor sitting on side of bed.  Called 911.Took him to [facility] colon and endo per patient's sister.  Nothing at large intestine or stomach.  rec f/u with his GI to get pill/camera. Taken off blood thinner. Wanted to discharge yesterday.  Still weak. Hadn't been out of bed for 3 days. Ended up being kept until today. Now sent home.   Still weak.   Weight before hospital Monday 280.4.  Today 293 at hospital.   I question the difference in the scales.  Legs swollen above knees and in abdomen.  No big change in his SOB.  Has dc instructions to f/u with cardiology ASAP. Scheduled with Dr. Harrington Challenger tomorrow AM.  Durward Fortes to bring dc instructions.  Advised to schedule f/u with GI asap as well.   Instructed to call EMS any changes in symptoms or further bleeding in the meantime.

## 2017-06-28 NOTE — Telephone Encounter (Signed)
Paperwork in Dr. Alan Ripper mailbox to be signed.  All was signed today and given to medical records to be faxed this morning back to [facility].

## 2017-06-28 NOTE — Telephone Encounter (Signed)
Sent to triage  Pt sister calling for rn   Pt c/o swelling: STAT is pt has developed SOB within 24 hours  1. How much weight have you gained and in what time span? 10 pounds, 06/25/2017  2. If swelling, where is the swelling located? Legs and stomach   3. Are you currently taking a fluid pill? yes  4. Are you currently SOB? yes  5. Do you have a log of your daily weights (if so, list)? 280-290  6. Have you gained 3 pounds in a day or 5 pounds in a week?  10 pounds in 4 days  7. Have you traveled recently? no, pt recently hospitalized in [place]

## 2017-06-28 NOTE — Telephone Encounter (Addendum)
Took call from Pt's sister.  Sister is concerned about Pt, Pt discharged today from [facility].  Per sister Pt had rectal bleeding on Monday and they took him to hospital for evaluation.  Pt had GI work up and no obvious source of bleeding was found.  Pt was advised to discontinue Xarelto at this time.  Per sister Pt weight was 280 pounds on Monday 06/25/2017 and Pt weight this am at hospital was 293 pounds 06/28/2017.  Sister states Pt has increasing edema.  Sister concerned about her and her husbands ability to care for patient at home after 3 day hospitalization.  Spoke with Pt's cardiologist's nurse, appointment available for tomorrow AM.  Pt nurse to call.

## 2017-06-29 ENCOUNTER — Ambulatory Visit (INDEPENDENT_AMBULATORY_CARE_PROVIDER_SITE_OTHER): Payer: Medicare Other | Admitting: Internal Medicine

## 2017-06-29 ENCOUNTER — Encounter: Payer: Self-pay | Admitting: Internal Medicine

## 2017-06-29 VITALS — BP 128/70 | HR 79 | Ht 75.0 in | Wt 289.0 lb

## 2017-06-29 DIAGNOSIS — I739 Peripheral vascular disease, unspecified: Secondary | ICD-10-CM | POA: Diagnosis not present

## 2017-06-29 DIAGNOSIS — N183 Chronic kidney disease, stage 3 unspecified: Secondary | ICD-10-CM

## 2017-06-29 DIAGNOSIS — I4819 Other persistent atrial fibrillation: Secondary | ICD-10-CM

## 2017-06-29 DIAGNOSIS — I2510 Atherosclerotic heart disease of native coronary artery without angina pectoris: Secondary | ICD-10-CM

## 2017-06-29 DIAGNOSIS — I481 Persistent atrial fibrillation: Secondary | ICD-10-CM

## 2017-06-29 DIAGNOSIS — I4891 Unspecified atrial fibrillation: Secondary | ICD-10-CM

## 2017-06-29 DIAGNOSIS — I5042 Chronic combined systolic (congestive) and diastolic (congestive) heart failure: Secondary | ICD-10-CM

## 2017-06-29 NOTE — Patient Instructions (Addendum)
Your physician recommends that you continue on your current medications as directed. Please refer to the Current Medication list given to you today.  STAY OFF [place]  Your physician recommends that you return for lab work TODAY (bmet, cbc, bnp)  HOME CARE NURSING AND PHYSICAL THERAPY ARE RECOMMENDED BY DR. Harrington Challenger.

## 2017-06-30 DIAGNOSIS — D631 Anemia in chronic kidney disease: Secondary | ICD-10-CM | POA: Diagnosis not present

## 2017-06-30 DIAGNOSIS — N183 Chronic kidney disease, stage 3 (moderate): Secondary | ICD-10-CM | POA: Diagnosis not present

## 2017-06-30 DIAGNOSIS — I5043 Acute on chronic combined systolic (congestive) and diastolic (congestive) heart failure: Secondary | ICD-10-CM | POA: Diagnosis not present

## 2017-06-30 DIAGNOSIS — I2510 Atherosclerotic heart disease of native coronary artery without angina pectoris: Secondary | ICD-10-CM | POA: Diagnosis not present

## 2017-06-30 DIAGNOSIS — J441 Chronic obstructive pulmonary disease with (acute) exacerbation: Secondary | ICD-10-CM | POA: Diagnosis not present

## 2017-06-30 DIAGNOSIS — I130 Hypertensive heart and chronic kidney disease with heart failure and stage 1 through stage 4 chronic kidney disease, or unspecified chronic kidney disease: Secondary | ICD-10-CM | POA: Diagnosis not present

## 2017-06-30 LAB — BASIC METABOLIC PANEL
BUN/Creatinine Ratio: 24 (ref 10–24)
BUN: 55 mg/dL — ABNORMAL HIGH (ref 8–27)
CHLORIDE: 95 mmol/L — AB (ref 96–106)
CO2: 24 mmol/L (ref 20–29)
Calcium: 9.3 mg/dL (ref 8.6–10.2)
Creatinine, Ser: 2.3 mg/dL — ABNORMAL HIGH (ref 0.76–1.27)
GFR calc Af Amer: 31 mL/min/{1.73_m2} — ABNORMAL LOW (ref >59)
GFR calc non Af Amer: 27 mL/min/{1.73_m2} — ABNORMAL LOW (ref >59)
GLUCOSE: 145 mg/dL — AB (ref 65–99)
Potassium: 3.8 mmol/L (ref 3.5–5.2)
SODIUM: 136 mmol/L (ref 134–144)

## 2017-06-30 LAB — CBC
Hematocrit: 27.5 % — ABNORMAL LOW (ref 37.5–51.0)
Hemoglobin: 8.8 g/dL — ABNORMAL LOW (ref 13.0–17.7)
MCH: 31.1 pg (ref 26.6–33.0)
MCHC: 32 g/dL (ref 31.5–35.7)
MCV: 97 fL (ref 79–97)
PLATELETS: 189 10*3/uL (ref 150–379)
RBC: 2.83 x10E6/uL — ABNORMAL LOW (ref 4.14–5.80)
RDW: 15.7 % — AB (ref 12.3–15.4)
WBC: 6.5 10*3/uL (ref 3.4–10.8)

## 2017-06-30 LAB — PRO B NATRIURETIC PEPTIDE: NT-PRO BNP: 11059 pg/mL — AB (ref 0–486)

## 2017-07-02 ENCOUNTER — Other Ambulatory Visit: Payer: Self-pay | Admitting: *Deleted

## 2017-07-02 ENCOUNTER — Telehealth: Payer: Self-pay

## 2017-07-02 DIAGNOSIS — I5042 Chronic combined systolic (congestive) and diastolic (congestive) heart failure: Secondary | ICD-10-CM

## 2017-07-02 MED ORDER — METOLAZONE 2.5 MG PO TABS: ORAL_TABLET | ORAL | 3 refills | Status: DC

## 2017-07-02 NOTE — Telephone Encounter (Signed)
Please ok with verbal order.

## 2017-07-02 NOTE — Progress Notes (Signed)
Notes recorded by Fay Records, MD on 07/02/2017 at 8:40 AM EDT Hgb is stable  Until pill study done needs to stay off o Xarelto FLuid is up  I would reocmm taking Zaroxylyn 3x per wk F/U BMET and BNP in 2 wks   Ask pt where pill study being done.  -----------------------  Notes recorded by Rodman Key, RN on 07/02/2017 at 9:45 AM EDT Patient's sister aware (DPR) to continue to hold Eliquis. Has appt in 2 days with [facility], Tye Savoy, PA-C   Will increase metolazone to 2.5 mg three times per week for 2 weeks.  Has appointment in 2 weeks (5/28) with Dr. Harrington Challenger. Will have lab work at time of visit.

## 2017-07-02 NOTE — Telephone Encounter (Signed)
Louann with [facility] calling for verbal order to evaluate patient. Pt was hospitalized following initial consult, needing ok to go evaluate again following d/c. Call back for VO [phone] Wallace Cullens, RN

## 2017-07-02 NOTE — Telephone Encounter (Signed)
Gave the verbal ok per Dr. Mingo Amber to [place] with [facility]. Katharina Caper, Koralynn Greenspan D, [place]

## 2017-07-03 DIAGNOSIS — D631 Anemia in chronic kidney disease: Secondary | ICD-10-CM | POA: Diagnosis not present

## 2017-07-03 DIAGNOSIS — J441 Chronic obstructive pulmonary disease with (acute) exacerbation: Secondary | ICD-10-CM | POA: Diagnosis not present

## 2017-07-03 DIAGNOSIS — I130 Hypertensive heart and chronic kidney disease with heart failure and stage 1 through stage 4 chronic kidney disease, or unspecified chronic kidney disease: Secondary | ICD-10-CM | POA: Diagnosis not present

## 2017-07-03 DIAGNOSIS — I2510 Atherosclerotic heart disease of native coronary artery without angina pectoris: Secondary | ICD-10-CM | POA: Diagnosis not present

## 2017-07-03 DIAGNOSIS — N183 Chronic kidney disease, stage 3 (moderate): Secondary | ICD-10-CM | POA: Diagnosis not present

## 2017-07-03 DIAGNOSIS — I5043 Acute on chronic combined systolic (congestive) and diastolic (congestive) heart failure: Secondary | ICD-10-CM | POA: Diagnosis not present

## 2017-07-04 ENCOUNTER — Encounter: Payer: Self-pay | Admitting: Nurse Practitioner

## 2017-07-04 ENCOUNTER — Other Ambulatory Visit: Payer: Self-pay

## 2017-07-04 ENCOUNTER — Ambulatory Visit (INDEPENDENT_AMBULATORY_CARE_PROVIDER_SITE_OTHER): Payer: Medicare Other | Admitting: Nurse Practitioner

## 2017-07-04 VITALS — BP 124/52 | HR 71 | Ht 75.0 in | Wt 284.0 lb

## 2017-07-04 DIAGNOSIS — K922 Gastrointestinal hemorrhage, unspecified: Secondary | ICD-10-CM | POA: Diagnosis not present

## 2017-07-04 DIAGNOSIS — D649 Anemia, unspecified: Secondary | ICD-10-CM | POA: Diagnosis not present

## 2017-07-04 DIAGNOSIS — I2510 Atherosclerotic heart disease of native coronary artery without angina pectoris: Secondary | ICD-10-CM

## 2017-07-04 DIAGNOSIS — Z7901 Long term (current) use of anticoagulants: Secondary | ICD-10-CM | POA: Diagnosis not present

## 2017-07-04 NOTE — Patient Instructions (Signed)
If you are age 77 or older, your body mass index should be between 23-30. Your Body mass index is 35.5 kg/m. If this is out of the aforementioned range listed, please consider follow up with your Primary Care Provider.  If you are age 60 or younger, your body mass index should be between 19-25. Your Body mass index is 35.5 kg/m. If this is out of the aformentioned range listed, please consider follow up with your Primary Care Provider.   Will call you about capsule study.  Thank you for choosing me and [facility].   Tye Savoy, NP

## 2017-07-04 NOTE — Progress Notes (Signed)
IMPRESSION and PLAN:    77 yo male with multiple medical problems admitted to [facility] 06/27/17 for painless hematochezia on Xarelto.  Overall hgb fell almost 3 grams from baseline of around 11 down to 8.3. Colonoscopy with TI intubation for 5cm was normal, only large hemorrhoids found. EGD unremarkable. ? Diverticular bleed though no diverticulums seen on exam and no CT scans in system to tell if he has them  -Xarelto remains on hold pending completion of GI evaluation. [facility] recommended Endo capsule but I'm not sure it would give [place] the answer of why he bled acutely. I will discuss this further with Dr. Fuller Plan, patient's primary GI. Patient has asked that we get back with his sister regarding the plan - [phone]  Terry Martinez      HPI:    Chief Complaint: recent GI bleeding   Patient is a 77 year old male known to Dr. Fuller Plan.  He has history adenomatous colon polyps.  He has multiple medical problems not limited to CKD IV, COPD, vascular disease, chronic systolic and diastolic heart failure, CAD / history of non-STEMI / stents, AFib on Xarelto.   Last week patient woke up and found a pool of bright red blood in the bed. Went to ED at [facility] and admitted for further evaluation. Hgb fell to 8.3 from baseline of ~  11 . EGD and colonoscopy basically unrevealing except for hemorrhoids. Patient had not been having any constipation or prior hemorrhoidal bleeding at home. Xarelto still on hold.    EGD 06/27/17 FINDINGS:  Esophagus: Appeared normal with no lesions. The GE junction and Z-line were at 40 cm.   Stomach: No hiatal hernia seen on retroflexion.A few erosions seen in the antrum, biopsies done for Hpylori from the antrum / body  Duodenum: The pylorus was patent. The first and second part of the duodenum were examined and appeared normal..    Colonoscopy INSTRUMENT: Olympus CF HQ190L video colonoscope  PROCEDURE: Informed consent was obtained before the  procedure which included the possible risks and any possible alternative treatments. An examination was done before the procedure and the patient was felt to be appropriate for the procedure and anesthesia type. A Time Out was performed verifying the correct patient and procedure(s). After adequate anesthesia was obtained a rectal examination was performed. The colonoscope was inserted into the rectum and guided around to the cecum without difficulty. The colon was then inflated and examined in a systematic fashion during slow withdrawal of the instrument. At least 2 passes were made around each turn. A retroflex maneuver was done in the rectum. The colon was decompressed and the colonoscope was removed. The patient tolerated the procedure well. Patient was transferred to recovery in stable condition.  FINDINGS:   Colon: The Prep was good , the TI was intubated for 5 cm and was normal The mucosa appeared normal with no lesions or polyps.  Large internal hemorrhoids seen on retroflexion.  IMPRESSION:   1. See above findings.  PLAN:   1. Routine post colonoscopy orders 2. Routine post EGD orders 3. No old or fresh blood seen, patient said the bleeding stopped yesterday. ? SB source 4.Hb is stable, ok to d/c he needs to f/u with his local GI to schedule a SB capsule endoscopy, would hold off restarting Xarelo until seen by his local GI, sign off    Review of systems:       Past Medical History:  Diagnosis Date  . Acute  on chronic combined systolic and diastolic CHF (congestive heart failure) ([place]) 01/26/2017  . Anemia    hx low iron  . Aortic insufficiency 01/26/2017  . Arthritis    "hands" (2/262018)  . Basal cell carcinoma    "left side of my face"  . Charcot's arthropathy   . Chronic combined systolic and diastolic CHF (congestive heart failure) ([place])   . Chronic pain   . CKD (chronic kidney disease), stage III ([place]) 01/26/2017  . Constipation   . COPD (chronic obstructive pulmonary  disease) ([place])   . Coronary artery disease   . DVT, lower extremity ([place])    many years  . Dyspnea    with exertion  . Family history of adverse reaction to anesthesia    sister has difficulty waking up  . Gout   . Heart murmur   . History of blood transfusion 1960s   "related to being cut up w/barbed wire"  . History of kidney stones   . Hyperlipidemia   . Hypertension   . Incisional hernia    x 2  . Myocardial infarction ([place])    3 stents  . Osteomyelitis of toe of left foot ([place])   . Peripheral artery disease ([place])   . Permanent atrial fibrillation ([place])   . Pneumonia   . 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 Subclavian artery stenosis, left ([place])    [name] 04/17/2016    Patient's surgical history, family medical history, social history, medications and allergies were all reviewed in Epic    Physical Exam:     BP (!) 124/52   Pulse 71   Ht 6\' 3"  (1.905 m)   Wt 284 lb (128.8 kg)   BMI 35.50 kg/m   GENERAL: pleasant obese male with walker in NAD PSYCH: :, cooperative, normal affect EENT:  conjunctiva pink, mucous membranes moist, neck supple without masses CARDIAC:  RRR, no murmur heard, 3+ BLE PULM: Normal respiratory effort ABDOMEN:  Exam in chair. Abdomen obese,  normal bowel sounds NEURO: Alert and oriented x 3, no focal neurologic deficits   Tye Savoy , NP 07/04/2017, 2:25 PM

## 2017-07-05 ENCOUNTER — Encounter (INDEPENDENT_AMBULATORY_CARE_PROVIDER_SITE_OTHER): Payer: Self-pay | Admitting: Orthopedic Surgery

## 2017-07-05 ENCOUNTER — Ambulatory Visit (INDEPENDENT_AMBULATORY_CARE_PROVIDER_SITE_OTHER): Payer: Medicare Other | Admitting: Orthopedic Surgery

## 2017-07-05 ENCOUNTER — Telehealth: Payer: Self-pay | Admitting: Internal Medicine

## 2017-07-05 DIAGNOSIS — I2510 Atherosclerotic heart disease of native coronary artery without angina pectoris: Secondary | ICD-10-CM | POA: Diagnosis not present

## 2017-07-05 DIAGNOSIS — Z89422 Acquired absence of other left toe(s): Secondary | ICD-10-CM

## 2017-07-05 DIAGNOSIS — Z89421 Acquired absence of other right toe(s): Secondary | ICD-10-CM | POA: Diagnosis not present

## 2017-07-05 DIAGNOSIS — I872 Venous insufficiency (chronic) (peripheral): Secondary | ICD-10-CM

## 2017-07-05 NOTE — Telephone Encounter (Signed)
Left detailed message on secure line per [facility]. Advised ok to start PT 2x wk for 2 wks.

## 2017-07-05 NOTE — Telephone Encounter (Signed)
New message    Gerald Stabs from [facility] calling to request order for PT twice a week for 2 weeks/ Please call [phone]
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
## 2017-07-05 NOTE — Patient Outreach (Signed)
Transition of care: Placed call to patient to determine if discharged home. Spoke with sister Garald Braver who states patient was discharged on 06/29/2017.  Sister reports that patient is doing well. No recent bleeding. Unknown site of bleeding. Currently off Xarelto  Sister reports today's weight of 280.2 pounds.   Sister report patient has weak legs.   PLANS: offered home visit for 07/09/2017 and sister has accepted.   Transition of care will continue on a weekly basis.  Tomasa Rand, RN, BSN, CEN [facility] [employer] [phone]

## 2017-07-06 ENCOUNTER — Encounter: Payer: Self-pay | Admitting: Nurse Practitioner

## 2017-07-09 ENCOUNTER — Other Ambulatory Visit: Payer: Self-pay

## 2017-07-09 DIAGNOSIS — I2510 Atherosclerotic heart disease of native coronary artery without angina pectoris: Secondary | ICD-10-CM | POA: Diagnosis not present

## 2017-07-09 DIAGNOSIS — I5043 Acute on chronic combined systolic (congestive) and diastolic (congestive) heart failure: Secondary | ICD-10-CM | POA: Diagnosis not present

## 2017-07-09 DIAGNOSIS — N183 Chronic kidney disease, stage 3 (moderate): Secondary | ICD-10-CM | POA: Diagnosis not present

## 2017-07-09 DIAGNOSIS — D631 Anemia in chronic kidney disease: Secondary | ICD-10-CM | POA: Diagnosis not present

## 2017-07-09 DIAGNOSIS — J441 Chronic obstructive pulmonary disease with (acute) exacerbation: Secondary | ICD-10-CM | POA: Diagnosis not present

## 2017-07-09 DIAGNOSIS — I130 Hypertensive heart and chronic kidney disease with heart failure and stage 1 through stage 4 chronic kidney disease, or unspecified chronic kidney disease: Secondary | ICD-10-CM | POA: Diagnosis not present

## 2017-07-09 NOTE — Patient Outreach (Signed)
[facility]) Care Management   07/09/2017  Terry Martinez. 05-16-1940 [id number]  Terry Martinez. is an 77 y.o. male  Subjective: Patient reports that he is doing much better now that he has the fluid off. Reports feeling good.  Weight range 275 pounds to 282.2 pounds.  Following low salt diet. Sister manages diet and medications. Patient reports no recent bleeding. Remains off blood thinner and has a follow up planned with primary MD tomorrow. Patient is active with [place] and PT.  Patient using rolling walker. Patient and sister verbalize patient has follow ups planned with primary MD, cardiology, nephrologist, and podiatry.  Objective:  Awake and alert. Walking well with walker.  Today's Vitals   07/09/17 0913 07/09/17 0918  BP: (!) 116/52   Pulse: 70   Resp: 18   SpO2: 98%   Weight: 276 lb 1.6 oz (125.2 kg)   Height: 1.905 m ('6\' 3"'$ )   PainSc:  3    Review of Systems  Constitutional: Positive for malaise/fatigue.  HENT: Negative.   Eyes:       Wearing glasses  Respiratory: Positive for sputum production.   Cardiovascular: Positive for leg swelling.  Gastrointestinal: Positive for constipation.  Genitourinary: Positive for frequency.  Musculoskeletal: Positive for falls.  Skin:       Skin tear to the right forearm.  Neurological: Negative.   Endo/Heme/Allergies:       Off blood thinner    Physical Exam  Constitutional: He is oriented to person, place, and time. He appears well-developed and well-nourished.  Cardiovascular: Normal rate and intact distal pulses.  Irregular rhythm  Respiratory: Effort normal and breath sounds normal.  GI: Soft. Bowel sounds are normal.  Musculoskeletal: Normal range of motion. He exhibits edema.  2 plus edema,  Missing right great toe and right little toe (5th toe) Left foot  Missing 4th toe.  Neurological: He is alert and oriented to person, place, and time.  Skin: Skin is warm and dry.   Right bottom of foot with open area the size of a pencil eraser, no drainage.     Encounter Medications:   Outpatient Encounter Medications as of 07/09/2017  Medication Sig Note  . acetaminophen (TYLENOL) 500 MG tablet Take 1,000 mg by mouth every 8 (eight) hours as needed for moderate pain or headache.    . albuterol (PROVENTIL HFA;VENTOLIN HFA) 108 (90 BASE) MCG/ACT inhaler Inhale 2 puffs into the lungs every 6 (six) hours as needed for wheezing or shortness of breath.   . allopurinol (ZYLOPRIM) 100 MG tablet TAKE ONE TABLET BY MOUTH ONCE DAILY IN THE EVENING (Patient taking differently: Take 100 mg by mouth daily. )   . budesonide-formoterol (SYMBICORT) 160-4.5 MCG/ACT inhaler Inhale 2 puffs into the lungs 2 (two) times daily.   . Carboxymethylcellul-Glycerin (LUBRICATING EYE DROPS OP) Place 1 drop into both eyes 4 (four) times daily as needed (dry eyes).    . Cholecalciferol (VITAMIN D-3 PO) Take 1 tablet by mouth every morning.    . hydrALAZINE (APRESOLINE) 25 MG tablet Take 0.5 tablets (12.5 mg total) by mouth every 8 (eight) hours.   . isosorbide mononitrate (IMDUR) 30 MG 24 hr tablet Take 0.5 tablets (15 mg total) by mouth daily.   . metolazone (ZAROXOLYN) 2.5 MG tablet Take one tablet 3 times per week, 30 minutes before torsemide for swelling   . metoprolol succinate (TOPROL-XL) 25 MG 24 hr tablet Take 0.5 tablets (12.5 mg total) by mouth daily.   [name]  metroNIDAZOLE (METROGEL) 1 % gel Apply 1 application topically daily as needed (SKIN IRRITATION/ROSACEA.).    [name] mometasone-formoterol (DULERA) 100-5 MCG/ACT AERO Inhale 2 puffs into the lungs 2 (two) times daily. 05/15/2017: Has sample from hospital. Reports this medication not covered by insurance  . polyethylene glycol powder (GLYCOLAX/MIRALAX) powder DISSOLVE 17G (1 CAPFUL) OF POWDER IN 8 OUNCES OF LIQUID AND DRINK 2 TIMES PER DAY AS NEEDED FOR MILD CONSTIPATION (Patient taking differently: DISSOLVE 17G (1 CAPFUL) OF POWDER IN 8 OUNCES OF  LIQUID AND DRINK Daily 3/4 of a cap ( Must Have every day))   . potassium chloride SA (K-DUR,KLOR-CON) 20 MEQ tablet Take 3 tablets (60 mEq total) by mouth 2 (two) times daily.   . pravastatin (PRAVACHOL) 40 MG tablet Take 40 mg by mouth daily.   [name] spironolactone (ALDACTONE) 25 MG tablet Take 1 tablet (25 mg total) by mouth daily.   [name] torsemide (DEMADEX) 20 MG tablet Take 3 tablets (60 mg total) by mouth 2 (two) times daily.   . vitamin C (ASCORBIC ACID) 500 MG tablet Take 500 mg by mouth daily.   [name] ipratropium-albuterol (DUONEB) 0.5-2.5 (3) MG/3ML SOLN Take 3 mLs by nebulization every 6 (six) hours as needed (wheezing). Dx:J44.1 (Patient not taking: Reported on 07/09/2017) 05/21/2017: Has nebulizer and is awaiting respiratory therapy to show patient how to use.   . nitroGLYCERIN (NITROSTAT) 0.4 MG SL tablet Place 1 tablet (0.4 mg total) under the tongue every 5 (five) minutes as needed for chest pain. (Patient not taking: Reported on 07/09/2017)   . Rivaroxaban (XARELTO) 15 MG TABS tablet Take 1 tablet (15 mg total) by mouth daily with supper. (Patient not taking: Reported on 07/04/2017)    No facility-administered encounter medications on file as of 07/09/2017.     Functional Status:   In your present state of health, do you have any difficulty performing the following activities: 07/09/2017 06/07/2017  Hearing? N N  Vision? N N  Difficulty concentrating or making decisions? N N  Walking or climbing stairs? Y Y  Dressing or bathing? N N  Doing errands, shopping? [name] Donning  Preparing Food and eating ? N -  Using the Toilet? N -  In the past six months, have you accidently leaked urine? - -  Do you have problems with loss of bowel control? N -  Managing your Medications? Y -  Comment sister manages medications. -  Managing your Finances? N -  Housekeeping or managing your Housekeeping? N -  Comment sister manages housekeeping -  Some recent data might be hidden    Fall/Depression Screening:     Fall Risk  07/09/2017 05/21/2017 05/21/2017  Falls in the past year? Yes No No  Number falls in past yr: 1 - -  Injury with Fall? No - -  Risk for fall due to : History of fall(s) - -  Follow up Falls evaluation completed;Falls prevention discussed - -   PHQ 2/9 Scores 07/09/2017 05/21/2017 05/21/2017 11/08/2016 10/18/2016 11/16/2015 07/27/2015  PHQ - 2 Score 0 0 0 0 0 0 0  PHQ- 9 Score - - - - - 0 -    Assessment:   (1) reviewed transition of care program. Consent on file.  (2) Heart failure: Patient continues to weigh daily, take medications as prescribed and follow low salt diet (3) skin concerns:  Skin tear to the right forearm and pressure ulcer to the right bottom of the foot. (4)active with home health (5)complains of being tired (6)follow  up appointment planned with transportation confirmed. (7)no active bleeding/ off blood thinner  Plan:  (1) next contact planned for 1 week via phone. Next home visit planned for June 14th. (2)Reviewed heart failure zones with patient and sister. Reviewed when to call MD. Reviewed importance of taking all medications as prescribed and following low salt diet.  (3)Reviewed signs of infection. Encouraged patient to discuss wounds with MD tomorrow at office visit.  (4) encouraged patient to do all PT exercises and be active.  (5) encouraged patient to discuss with MD. Reviewed signs of bleeding with patient. (6) encouraged patient to keep all pending appointments. (7) reviewed signs of bleeding. Encouraged patient to follow up about plans with the blood thinner.   This note sent to MD.  Patient has good family support at home.  THN CM Care Plan Problem One     Most Recent Value  Care Plan Problem One  Recent admission for COPD and CHF  Role Documenting the Problem One  Care Management Coordinator  Care Plan for Problem One  Active  THN Long Term Goal   Patient will report no readmissions for heart failure or COPD in the next 91 days.   THN Long Term  Goal Start Date  07/12/17 Sudie Grumbling restarted due to readmission]  Interventions for Problem One Long Term Goal  Reviewed COPD zones and Heart failure zones with patient.  Home visit completed.   THN CM Short Term Goal #1   Patient will verbalize arrival of nebulizer and rolling walker in the next 2 days.   THN CM Short Term Goal #1 Start Date  05/15/17  [facility] CM Short Term Goal #1 Met Date  05/21/17  THN CM Short Term Goal #2   Patient will verbalize weighing daily for the next 30 days.   THN CM Short Term Goal #2 Start Date  06/20/17 [date restarted. ]  THN CM Short Term Goal #2 Met Date  06/26/17  [facility] CM Short Term Goal #3  Sister will reports the arrival in the next 7 days.   THN CM Short Term Goal #3 Start Date  06/20/17  THN CM Short Term Goal #3 Met Date  07/05/17  THN CM Short Term Goal #4  Patient will report no new bleeding in the next 30 days.  THN CM Short Term Goal #4 Start Date  07/05/17  Interventions for Short Term Goal #4  Home visit completed. Reviewed signs of bleeding.     [facility] CM Care Plan Problem Two     Most Recent Value  Care Plan Problem Two  Alteration in skin intergrity related to wounds  Role Documenting the Problem Two  Care Management Coordinator  Care Plan for Problem Two  Active  THN CM Short Term Goal #1   Patient will report increased healing to right foot and right forearm wounds in the next 30 days.  THN CM Short Term Goal #1 Start Date  07/09/17  Interventions for Short Term Goal #2   Reviewed concerns with patient. Encouraged patient to discuss with MD at office visit tomorrow.  Encouraged sister and patient to observe closely for infection      Tomasa Rand, RN, BSN, [facility] [employer] [phone]

## 2017-07-09 NOTE — Progress Notes (Signed)
Reviewed and agree with initial management plan.  Jad Johansson T. Aster Screws, MD FACG 

## 2017-07-10 ENCOUNTER — Telehealth: Payer: Self-pay

## 2017-07-10 ENCOUNTER — Other Ambulatory Visit: Payer: Self-pay

## 2017-07-10 ENCOUNTER — Ambulatory Visit: Payer: Medicare Other | Admitting: Podiatry

## 2017-07-10 ENCOUNTER — Other Ambulatory Visit: Payer: Medicare Other | Admitting: Orthotics

## 2017-07-10 ENCOUNTER — Ambulatory Visit (INDEPENDENT_AMBULATORY_CARE_PROVIDER_SITE_OTHER): Payer: Medicare Other | Admitting: Family Medicine

## 2017-07-10 ENCOUNTER — Encounter: Payer: Self-pay | Admitting: Family Medicine

## 2017-07-10 DIAGNOSIS — I5043 Acute on chronic combined systolic (congestive) and diastolic (congestive) heart failure: Secondary | ICD-10-CM | POA: Diagnosis not present

## 2017-07-10 DIAGNOSIS — S51811A Laceration without foreign body of right forearm, initial encounter: Secondary | ICD-10-CM

## 2017-07-10 DIAGNOSIS — I5042 Chronic combined systolic (congestive) and diastolic (congestive) heart failure: Secondary | ICD-10-CM

## 2017-07-10 DIAGNOSIS — N184 Chronic kidney disease, stage 4 (severe): Secondary | ICD-10-CM | POA: Diagnosis not present

## 2017-07-10 DIAGNOSIS — I4821 Permanent atrial fibrillation: Secondary | ICD-10-CM

## 2017-07-10 DIAGNOSIS — J441 Chronic obstructive pulmonary disease with (acute) exacerbation: Secondary | ICD-10-CM | POA: Diagnosis not present

## 2017-07-10 DIAGNOSIS — N183 Chronic kidney disease, stage 3 (moderate): Secondary | ICD-10-CM | POA: Diagnosis not present

## 2017-07-10 DIAGNOSIS — I2510 Atherosclerotic heart disease of native coronary artery without angina pectoris: Secondary | ICD-10-CM

## 2017-07-10 DIAGNOSIS — I482 Chronic atrial fibrillation: Secondary | ICD-10-CM | POA: Diagnosis present

## 2017-07-10 DIAGNOSIS — I130 Hypertensive heart and chronic kidney disease with heart failure and stage 1 through stage 4 chronic kidney disease, or unspecified chronic kidney disease: Secondary | ICD-10-CM | POA: Diagnosis not present

## 2017-07-10 DIAGNOSIS — D631 Anemia in chronic kidney disease: Secondary | ICD-10-CM | POA: Diagnosis not present

## 2017-07-10 NOTE — Telephone Encounter (Signed)
Melonie informed. Nafis Farnan, Salome Spotted, CMA
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
## 2017-07-10 NOTE — Telephone Encounter (Signed)
Melonie- PT [facility] calling for continued orders to see pt 2x week for 6 weeks. Call back for VO [phone] Wallace Cullens, RN

## 2017-07-10 NOTE — Patient Instructions (Signed)
It was good to see you today.  Keep wrapping the arm as you have been.  I have sent in [place] for you.  Use this for the next 10 days.    Make an appointment on the way out to see Dr. Valentina Lucks in pharmacy clinic for PFTs.    Come back and see me in about 6 weeks to make sure everything is going well.

## 2017-07-10 NOTE — Telephone Encounter (Signed)
Please give ok for verbal orders
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
## 2017-07-12 ENCOUNTER — Encounter: Payer: Self-pay | Admitting: Family Medicine

## 2017-07-12 DIAGNOSIS — J441 Chronic obstructive pulmonary disease with (acute) exacerbation: Secondary | ICD-10-CM | POA: Diagnosis not present

## 2017-07-12 DIAGNOSIS — I5043 Acute on chronic combined systolic (congestive) and diastolic (congestive) heart failure: Secondary | ICD-10-CM | POA: Diagnosis not present

## 2017-07-12 DIAGNOSIS — D631 Anemia in chronic kidney disease: Secondary | ICD-10-CM | POA: Diagnosis not present

## 2017-07-12 DIAGNOSIS — I2510 Atherosclerotic heart disease of native coronary artery without angina pectoris: Secondary | ICD-10-CM | POA: Diagnosis not present

## 2017-07-12 DIAGNOSIS — I130 Hypertensive heart and chronic kidney disease with heart failure and stage 1 through stage 4 chronic kidney disease, or unspecified chronic kidney disease: Secondary | ICD-10-CM | POA: Diagnosis not present

## 2017-07-12 DIAGNOSIS — N183 Chronic kidney disease, stage 3 (moderate): Secondary | ICD-10-CM | POA: Diagnosis not present

## 2017-07-12 MED ORDER — MUPIROCIN CALCIUM 2 % EX CREA: 1.0000 "application " | TOPICAL_CREAM | Freq: Two times a day (BID) | CUTANEOUS | 1 refills | Status: AC

## 2017-07-12 NOTE — Assessment & Plan Note (Signed)
Patient states he has never been told before the past he has COPD.  He was on [facility] after leaving the hospital and switch to Symbicort. -I recommended he follow-up with our pharmacy clinic here to have PFTs performed.  Most likely does have COPD as he is a long-term smoker but again this is never been diagnosed.  As his dyspnea is usually related to his cardiovascular status will be good to know about his lung function.

## 2017-07-12 NOTE — Assessment & Plan Note (Signed)
Needs to be back on his anticoagulant but there is a question of lower GI bleed.  He is waiting to hear back from his gastroenterologist regarding this.  He actually has regular rate and rhythm on my exam today.

## 2017-07-12 NOTE — Assessment & Plan Note (Signed)
Exacerbation was recent for his most recent hospitalization.  He is on 3 times a week Zaroxolyn plus torsemide.  He has follow-up with cardiology later this week.  They will revisit whether he continues to need his metolazone or can just get by on torsemide. -Currently is doing well.  His lungs are clear.  From report it sounds like his lower extremity edema is also improving.

## 2017-07-12 NOTE — Assessment & Plan Note (Signed)
This is previously limited how much we diuresis we can achieve.  For now we will continue him on his torsemide plus his metolazone.  Is having good diuresis per report.

## 2017-07-12 NOTE — Assessment & Plan Note (Signed)
New wound from last one.  -Gave many of the same instructions as last time including antibacterial ointment and nonstick gauze which is applied here in clinic.  His sister is changing his dressing daily.  He also has home health nursing.  This is improving according to him.  Present for about a week.  Follow-up if no improvement in the next 2 weeks.  Red flags for infection provided.

## 2017-07-13 ENCOUNTER — Ambulatory Visit (INDEPENDENT_AMBULATORY_CARE_PROVIDER_SITE_OTHER): Payer: Medicare Other | Admitting: Pharmacist

## 2017-07-13 ENCOUNTER — Encounter: Payer: Self-pay | Admitting: Pharmacist

## 2017-07-13 DIAGNOSIS — J441 Chronic obstructive pulmonary disease with (acute) exacerbation: Secondary | ICD-10-CM

## 2017-07-13 DIAGNOSIS — N183 Chronic kidney disease, stage 3 (moderate): Secondary | ICD-10-CM | POA: Diagnosis not present

## 2017-07-13 DIAGNOSIS — I5043 Acute on chronic combined systolic (congestive) and diastolic (congestive) heart failure: Secondary | ICD-10-CM | POA: Diagnosis not present

## 2017-07-13 DIAGNOSIS — D631 Anemia in chronic kidney disease: Secondary | ICD-10-CM | POA: Diagnosis not present

## 2017-07-13 DIAGNOSIS — I2510 Atherosclerotic heart disease of native coronary artery without angina pectoris: Secondary | ICD-10-CM

## 2017-07-13 DIAGNOSIS — I130 Hypertensive heart and chronic kidney disease with heart failure and stage 1 through stage 4 chronic kidney disease, or unspecified chronic kidney disease: Secondary | ICD-10-CM | POA: Diagnosis not present

## 2017-07-13 MED ORDER — FLUTICASONE-UMECLIDIN-VILANT 100-62.5-25 MCG/INH IN AEPB: 1.0000 | INHALATION_SPRAY | Freq: Every day | RESPIRATORY_TRACT | 0 refills | Status: DC

## 2017-07-13 NOTE — Assessment & Plan Note (Signed)
Patient has been experiencing dyspnea with recent hospitalization and taking albuterol PRN, Symbicort, Dulera, and PRN DuoNeb. Spirometry evaluation reveals Severe obstruction.  Post nebulized albuterol tx revealed no reversibility, indicative of non-reversible obstructive lung disease.  Spirometry corresponding to GOLD Grade 3 per FEV1%.  GOLD Treatment Group D based on CAT score and exacerbations in the last year.  Discontinue Symbicort and Dulera at this time.  -Begin Trelegy Ellipta - 1 inhalation daily.  Following instruction patient was able to demonstrate good technique.   -Educated patient on purpose, proper use, potential adverse effects including dry mouth. -Reviewed results of pulmonary function tests.  Pt verbalized understanding of results and education.   -Continued PRN albuterol and DuoNeb, as pt finds relief with these treatments.

## 2017-07-13 NOTE — Patient Instructions (Addendum)
It was nice seeing you today!  1. START Trelegy Ellipta - 1 inhalation daily.  2. STOP your other inhalers (Symbicort and Dulera).  3. Continue your albuterol inhaler and DuoNeb nebulizer only when you need it.  Please follow-up with [place] in the pharmacy clinic in 3 weeks.
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
## 2017-07-14 DIAGNOSIS — D631 Anemia in chronic kidney disease: Secondary | ICD-10-CM | POA: Diagnosis not present

## 2017-07-14 DIAGNOSIS — N183 Chronic kidney disease, stage 3 (moderate): Secondary | ICD-10-CM | POA: Diagnosis not present

## 2017-07-14 DIAGNOSIS — I739 Peripheral vascular disease, unspecified: Secondary | ICD-10-CM | POA: Diagnosis not present

## 2017-07-14 DIAGNOSIS — I50813 Acute on chronic right heart failure: Secondary | ICD-10-CM | POA: Diagnosis not present

## 2017-07-14 DIAGNOSIS — E785 Hyperlipidemia, unspecified: Secondary | ICD-10-CM | POA: Diagnosis not present

## 2017-07-14 DIAGNOSIS — I5043 Acute on chronic combined systolic (congestive) and diastolic (congestive) heart failure: Secondary | ICD-10-CM | POA: Diagnosis not present

## 2017-07-14 DIAGNOSIS — M109 Gout, unspecified: Secondary | ICD-10-CM | POA: Diagnosis not present

## 2017-07-14 DIAGNOSIS — I130 Hypertensive heart and chronic kidney disease with heart failure and stage 1 through stage 4 chronic kidney disease, or unspecified chronic kidney disease: Secondary | ICD-10-CM | POA: Diagnosis not present

## 2017-07-14 DIAGNOSIS — I2510 Atherosclerotic heart disease of native coronary artery without angina pectoris: Secondary | ICD-10-CM | POA: Diagnosis not present

## 2017-07-14 DIAGNOSIS — Z7901 Long term (current) use of anticoagulants: Secondary | ICD-10-CM | POA: Diagnosis not present

## 2017-07-14 DIAGNOSIS — I4891 Unspecified atrial fibrillation: Secondary | ICD-10-CM | POA: Diagnosis not present

## 2017-07-14 DIAGNOSIS — J441 Chronic obstructive pulmonary disease with (acute) exacerbation: Secondary | ICD-10-CM | POA: Diagnosis not present

## 2017-07-14 DIAGNOSIS — K922 Gastrointestinal hemorrhage, unspecified: Secondary | ICD-10-CM | POA: Diagnosis not present

## 2017-07-14 DIAGNOSIS — Z7951 Long term (current) use of inhaled steroids: Secondary | ICD-10-CM | POA: Diagnosis not present

## 2017-07-16 DIAGNOSIS — I2510 Atherosclerotic heart disease of native coronary artery without angina pectoris: Secondary | ICD-10-CM | POA: Diagnosis not present

## 2017-07-16 DIAGNOSIS — I130 Hypertensive heart and chronic kidney disease with heart failure and stage 1 through stage 4 chronic kidney disease, or unspecified chronic kidney disease: Secondary | ICD-10-CM | POA: Diagnosis not present

## 2017-07-16 DIAGNOSIS — N183 Chronic kidney disease, stage 3 (moderate): Secondary | ICD-10-CM | POA: Diagnosis not present

## 2017-07-16 DIAGNOSIS — I50813 Acute on chronic right heart failure: Secondary | ICD-10-CM | POA: Diagnosis not present

## 2017-07-16 DIAGNOSIS — I5043 Acute on chronic combined systolic (congestive) and diastolic (congestive) heart failure: Secondary | ICD-10-CM | POA: Diagnosis not present

## 2017-07-16 DIAGNOSIS — K922 Gastrointestinal hemorrhage, unspecified: Secondary | ICD-10-CM | POA: Diagnosis not present

## 2017-07-17 ENCOUNTER — Other Ambulatory Visit: Payer: Self-pay

## 2017-07-17 ENCOUNTER — Ambulatory Visit: Payer: Medicare Other | Admitting: Orthotics

## 2017-07-17 ENCOUNTER — Encounter: Payer: Self-pay | Admitting: Podiatry

## 2017-07-17 ENCOUNTER — Encounter: Payer: Self-pay | Admitting: Internal Medicine

## 2017-07-17 ENCOUNTER — Ambulatory Visit (INDEPENDENT_AMBULATORY_CARE_PROVIDER_SITE_OTHER): Payer: Medicare Other | Admitting: Internal Medicine

## 2017-07-17 ENCOUNTER — Telehealth: Payer: Self-pay

## 2017-07-17 ENCOUNTER — Ambulatory Visit (INDEPENDENT_AMBULATORY_CARE_PROVIDER_SITE_OTHER): Payer: Medicare Other | Admitting: Podiatry

## 2017-07-17 VITALS — BP 122/58 | HR 71 | Ht 75.0 in | Wt 277.8 lb

## 2017-07-17 DIAGNOSIS — N183 Chronic kidney disease, stage 3 unspecified: Secondary | ICD-10-CM

## 2017-07-17 DIAGNOSIS — Z89419 Acquired absence of unspecified great toe: Secondary | ICD-10-CM

## 2017-07-17 DIAGNOSIS — I481 Persistent atrial fibrillation: Secondary | ICD-10-CM | POA: Diagnosis not present

## 2017-07-17 DIAGNOSIS — M79674 Pain in right toe(s): Secondary | ICD-10-CM | POA: Diagnosis not present

## 2017-07-17 DIAGNOSIS — I5042 Chronic combined systolic (congestive) and diastolic (congestive) heart failure: Secondary | ICD-10-CM

## 2017-07-17 DIAGNOSIS — R059 Cough, unspecified: Secondary | ICD-10-CM

## 2017-07-17 DIAGNOSIS — I2510 Atherosclerotic heart disease of native coronary artery without angina pectoris: Secondary | ICD-10-CM

## 2017-07-17 DIAGNOSIS — I739 Peripheral vascular disease, unspecified: Secondary | ICD-10-CM

## 2017-07-17 DIAGNOSIS — R05 Cough: Secondary | ICD-10-CM

## 2017-07-17 DIAGNOSIS — M79675 Pain in left toe(s): Secondary | ICD-10-CM | POA: Diagnosis not present

## 2017-07-17 DIAGNOSIS — B351 Tinea unguium: Secondary | ICD-10-CM

## 2017-07-17 DIAGNOSIS — I4819 Other persistent atrial fibrillation: Secondary | ICD-10-CM

## 2017-07-17 LAB — BASIC METABOLIC PANEL WITH GFR
BUN/Creatinine Ratio: 25 — ABNORMAL HIGH (ref 10–24)
BUN: 61 mg/dL — ABNORMAL HIGH (ref 8–27)
CO2: 22 mmol/L (ref 20–29)
Calcium: 9.3 mg/dL (ref 8.6–10.2)
Chloride: 95 mmol/L — ABNORMAL LOW (ref 96–106)
Creatinine, Ser: 2.42 mg/dL — ABNORMAL HIGH (ref 0.76–1.27)
GFR calc Af Amer: 29 mL/min/{1.73_m2} — ABNORMAL LOW
GFR calc non Af Amer: 25 mL/min/{1.73_m2} — ABNORMAL LOW
Glucose: 106 mg/dL — ABNORMAL HIGH (ref 65–99)
Potassium: 4.4 mmol/L (ref 3.5–5.2)
Sodium: 136 mmol/L (ref 134–144)

## 2017-07-17 LAB — PRO B NATRIURETIC PEPTIDE: NT-Pro BNP: 13891 pg/mL — ABNORMAL HIGH (ref 0–486)

## 2017-07-17 LAB — CBC
Hematocrit: 28.4 % — ABNORMAL LOW (ref 37.5–51.0)
Hemoglobin: 8.8 g/dL — ABNORMAL LOW (ref 13.0–17.7)
MCH: 28.8 pg (ref 26.6–33.0)
MCHC: 31 g/dL — ABNORMAL LOW (ref 31.5–35.7)
MCV: 93 fL (ref 79–97)
Platelets: 143 10*3/uL — ABNORMAL LOW (ref 150–450)
RBC: 3.06 x10E6/uL — ABNORMAL LOW (ref 4.14–5.80)
RDW: 15.9 % — ABNORMAL HIGH (ref 12.3–15.4)
WBC: 6 10*3/uL (ref 3.4–10.8)

## 2017-07-17 MED ORDER — TORSEMIDE 20 MG PO TABS: 60.0000 mg | ORAL_TABLET | Freq: Two times a day (BID) | ORAL | 11 refills | Status: DC

## 2017-07-17 NOTE — Patient Instructions (Signed)
Your physician recommends that you continue on your current medications as directed. Please refer to the Current Medication list given to you today.  Your physician recommends that you return for lab work in: today (BMET, BNP, CBC)  Your physician recommends that you schedule a follow-up appointment in: 6-8 [place].

## 2017-07-17 NOTE — Patient Outreach (Signed)
Transition of care: Placed call to patient and spoke with sister Garald Braver.  Mrs. Terry Martinez reports that patient had 3 doctors appointments today and he is exhausted. Reports patient continues to be weak.  Reports he is following his low salt diet and weighing daily.  Reports no resolution about blood thinners yet. Patient discussed with primary MD last week and cardiology today.  Sister reports cardiologist will call her back with directions.  PLAN: will continue weekly transition of care calls.  Tomasa Rand, RN, BSN, CEN [facility] [employer] [phone]
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
## 2017-07-17 NOTE — Telephone Encounter (Signed)
Amber- RN with [facility] calling for verbal orders to continue nursing services. Call back [phone] Wallace Cullens, RN

## 2017-07-17 NOTE — Progress Notes (Signed)
Needs to return to p/up shoes due to PCP not signing off on docs.

## 2017-07-18 DIAGNOSIS — I50813 Acute on chronic right heart failure: Secondary | ICD-10-CM | POA: Diagnosis not present

## 2017-07-18 DIAGNOSIS — K922 Gastrointestinal hemorrhage, unspecified: Secondary | ICD-10-CM | POA: Diagnosis not present

## 2017-07-18 DIAGNOSIS — N183 Chronic kidney disease, stage 3 (moderate): Secondary | ICD-10-CM | POA: Diagnosis not present

## 2017-07-18 DIAGNOSIS — I130 Hypertensive heart and chronic kidney disease with heart failure and stage 1 through stage 4 chronic kidney disease, or unspecified chronic kidney disease: Secondary | ICD-10-CM | POA: Diagnosis not present

## 2017-07-18 DIAGNOSIS — I5043 Acute on chronic combined systolic (congestive) and diastolic (congestive) heart failure: Secondary | ICD-10-CM | POA: Diagnosis not present

## 2017-07-18 DIAGNOSIS — I2510 Atherosclerotic heart disease of native coronary artery without angina pectoris: Secondary | ICD-10-CM | POA: Diagnosis not present

## 2017-07-18 NOTE — Telephone Encounter (Signed)
Contacted Terry Martinez and verbal orders given per Dr. Mingo Terry Martinez. Terry Martinez, Terry Martinez D, [place]

## 2017-07-18 NOTE — Progress Notes (Signed)
Subjective: 77 y.o. returns the office today for elongated, thickened toenails which he cannot trim himself . Denies any redness or drainage around the nails. He is also awaiting diabetic shoes. Denies any acute changes since last appointment and no new complaints today. Denies any systemic complaints such as fevers, chills, nausea, vomiting.   PCP: Alveda Reasons, MD   Objective: AAO 3, NAD DP/PT pulses decreased, CRT less than 3 seconds Protective sensation decreased with Derrel Nip monofilament History of multiple digital amputations.  This is of the left fourth, right first, fifth rays.  There is prominence of metatarsal heads plantarly. Nails hypertrophic, dystrophic, elongated, brittle, discolored 7. There is tenderness overlying the nails of the remaining digits bilaterally. There is no surrounding erythema or drainage along the nail sites. There is an ulceration on the right foot submetatarsal 2 area measuring 1 x 0.8 cm with granular wound base.  There is no surrounding erythema, ascending cellulitis.  There is no fluctuation or crepitation there is no malodor.  No clinical signs of infection.  Upon asking about this he states that he has seen Dr. Sharol Given  and he is under the care of Dr. Sharol Given for this. No open lesions or pre-ulcerative lesions are identified. No other areas of tenderness bilateral lower extremities. No overlying edema, erythema, increased warmth. No pain with calf compression, swelling, warmth, erythema.  Assessment: Patient presents with symptomatic onychomycosis; multiple digital amputations with prominent metatarsal heads  Plan: -Treatment options including alternatives, risks, complications were discussed -Nails sharply debrided 7 without complication/bleeding. -Ulceration appears to be noninfected.  He is under the care of Dr. Sharol Given for this he reports. Will defer treatment to him.  -Discussed daily foot inspection. If there are any changes, to call the  office immediately.  -Follow-up in 3 months or sooner if any problems are to arise. In the meantime, encouraged to call the office with any questions, concerns, changes symptoms.  Celesta Gentile, DPM   *After he left it looks like he last saw Dr. Sharol Given on 5/16. Per his note there was no open lesion and has a 3 month follow up with him. I will contact him to make sure he seen either by Dr. Sharol Given or myself for this.

## 2017-07-18 NOTE — Telephone Encounter (Signed)
Please ok verbal orders.

## 2017-07-19 ENCOUNTER — Other Ambulatory Visit: Payer: Self-pay | Admitting: *Deleted

## 2017-07-19 ENCOUNTER — Telehealth: Payer: Self-pay | Admitting: Internal Medicine

## 2017-07-19 DIAGNOSIS — I2510 Atherosclerotic heart disease of native coronary artery without angina pectoris: Secondary | ICD-10-CM | POA: Diagnosis not present

## 2017-07-19 DIAGNOSIS — I50813 Acute on chronic right heart failure: Secondary | ICD-10-CM | POA: Diagnosis not present

## 2017-07-19 DIAGNOSIS — K922 Gastrointestinal hemorrhage, unspecified: Secondary | ICD-10-CM | POA: Diagnosis not present

## 2017-07-19 DIAGNOSIS — I130 Hypertensive heart and chronic kidney disease with heart failure and stage 1 through stage 4 chronic kidney disease, or unspecified chronic kidney disease: Secondary | ICD-10-CM | POA: Diagnosis not present

## 2017-07-19 DIAGNOSIS — I5043 Acute on chronic combined systolic (congestive) and diastolic (congestive) heart failure: Secondary | ICD-10-CM | POA: Diagnosis not present

## 2017-07-19 DIAGNOSIS — N183 Chronic kidney disease, stage 3 (moderate): Secondary | ICD-10-CM | POA: Diagnosis not present

## 2017-07-19 MED ORDER — TORSEMIDE 20 MG PO TABS: 60.0000 mg | ORAL_TABLET | Freq: Two times a day (BID) | ORAL | 3 refills | Status: AC

## 2017-07-19 NOTE — Telephone Encounter (Signed)
New Message    *STAT* If patient is at the pharmacy, call can be transferred to refill team.   1. Which medications need to be refilled? (please list name of each medication and dose if known) torsemide (DEMADEX) 20 MG tablet  2. Which pharmacy/location (including street and city if local pharmacy) is medication to be sent to?  [place] [address]  3. Do they need a 30 day or 90 day supply?   Pt is at the pharmacy

## 2017-07-19 NOTE — Telephone Encounter (Signed)
Order was put in but was on class "no print", quantity was also incorrect. I have corrected the order and sent to the pharmacy.

## 2017-07-23 ENCOUNTER — Telehealth: Payer: Self-pay

## 2017-07-23 ENCOUNTER — Telehealth: Payer: Self-pay | Admitting: *Deleted

## 2017-07-23 DIAGNOSIS — I5042 Chronic combined systolic (congestive) and diastolic (congestive) heart failure: Secondary | ICD-10-CM

## 2017-07-23 DIAGNOSIS — N183 Chronic kidney disease, stage 3 (moderate): Secondary | ICD-10-CM | POA: Diagnosis not present

## 2017-07-23 DIAGNOSIS — I5043 Acute on chronic combined systolic (congestive) and diastolic (congestive) heart failure: Secondary | ICD-10-CM | POA: Diagnosis not present

## 2017-07-23 DIAGNOSIS — I50813 Acute on chronic right heart failure: Secondary | ICD-10-CM | POA: Diagnosis not present

## 2017-07-23 DIAGNOSIS — K922 Gastrointestinal hemorrhage, unspecified: Secondary | ICD-10-CM | POA: Diagnosis not present

## 2017-07-23 DIAGNOSIS — I130 Hypertensive heart and chronic kidney disease with heart failure and stage 1 through stage 4 chronic kidney disease, or unspecified chronic kidney disease: Secondary | ICD-10-CM | POA: Diagnosis not present

## 2017-07-23 DIAGNOSIS — I2510 Atherosclerotic heart disease of native coronary artery without angina pectoris: Secondary | ICD-10-CM | POA: Diagnosis not present

## 2017-07-23 MED ORDER — METOLAZONE 2.5 MG PO TABS: ORAL_TABLET | ORAL | 3 refills | Status: DC

## 2017-07-23 NOTE — Telephone Encounter (Signed)
Faxed and placed in nurse box

## 2017-07-23 NOTE — Telephone Encounter (Signed)
Spoke with patient's sister. She is aware pt is to change metolazone to every Mon and Thurs Keep track of weights an watch salt. Will come for labs on 6/13 (will be nearby for another appointment)

## 2017-07-23 NOTE — Telephone Encounter (Signed)
Faxed order received from [facility] for [facility] Certification and Plan of Care. Orders placed in PCP's box for signature. Please return to nurse clinic when complete.   Danley Danker, RN [facility] RN)

## 2017-07-23 NOTE — Telephone Encounter (Signed)
-----   Message from [place], MD sent at 07/19/2017 11:24 PM EDT ----- CBC is stable   Hgb 8.8 FLuid is still up With kidney function would back down on  Zaroxylyn to 2x per wk   Keep track of wts F/U BMET and BNP in 2 wks    Watch salt   Weigh

## 2017-07-24 ENCOUNTER — Other Ambulatory Visit: Payer: Self-pay

## 2017-07-24 ENCOUNTER — Telehealth: Payer: Self-pay

## 2017-07-24 ENCOUNTER — Other Ambulatory Visit: Payer: Self-pay | Admitting: Cardiology

## 2017-07-24 DIAGNOSIS — I2510 Atherosclerotic heart disease of native coronary artery without angina pectoris: Secondary | ICD-10-CM | POA: Diagnosis not present

## 2017-07-24 DIAGNOSIS — D649 Anemia, unspecified: Secondary | ICD-10-CM

## 2017-07-24 DIAGNOSIS — I5043 Acute on chronic combined systolic (congestive) and diastolic (congestive) heart failure: Secondary | ICD-10-CM | POA: Diagnosis not present

## 2017-07-24 DIAGNOSIS — I130 Hypertensive heart and chronic kidney disease with heart failure and stage 1 through stage 4 chronic kidney disease, or unspecified chronic kidney disease: Secondary | ICD-10-CM | POA: Diagnosis not present

## 2017-07-24 DIAGNOSIS — N183 Chronic kidney disease, stage 3 (moderate): Secondary | ICD-10-CM | POA: Diagnosis not present

## 2017-07-24 DIAGNOSIS — I50813 Acute on chronic right heart failure: Secondary | ICD-10-CM | POA: Diagnosis not present

## 2017-07-24 DIAGNOSIS — K922 Gastrointestinal hemorrhage, unspecified: Secondary | ICD-10-CM

## 2017-07-24 NOTE — Telephone Encounter (Signed)
Signed orders faxed to [facility] at [phone]. Danley Danker, RN [facility] RN)

## 2017-07-24 NOTE — Patient Outreach (Signed)
Transition of care.  Today's Vitals   07/24/17 0953  Weight: 266 lb (120.7 kg)   Placed call to patient and spoke with sister. Garald Braver who reports that patient is doing well. Reports weight is the lowest its been in a long time. Reports swelling is almost gone. Reports patient continues to be off blood thinner. Sister is waiting on direction from MD office. Sister reports skin tear is healing up nicely. Continues to use walker and be active with home health nursing ( coming today) and PT ( saw patient yesterday).  Sister reports patient is limiting salt. Sister states cardiology called her yesterday and decrease metoazone to 2 times per week due to kidney function.  Sister reports patient has follow up appointment with nephrologist on 08/02/2017  PLAN:  Will continue weekly transition of care calls. Encouraged low salt diet and daily weights. Reviewed heart failure zones and when to call MD.  Tomasa Rand, RN, BSN, CEN [place] Coordinator [phone]

## 2017-07-24 NOTE — Telephone Encounter (Signed)
-----   Message from Ladene Artist, MD sent at 07/23/2017  4:23 PM EDT ----- Reviewed chart after receiving Dr. Alan Ripper office note.  OK to resume anticoagulation and monitor for rebleeding.  Schedule capsule endoscopy to further evaluate for bleeding source.

## 2017-07-24 NOTE — Telephone Encounter (Signed)
Patient has been scheduled at [facility] for 08/07/17.  I spoke with his sister.  New instructions mailed.  His primary care is trying to decide which anticoagulation med to resume.

## 2017-07-25 ENCOUNTER — Telehealth: Payer: Self-pay | Admitting: Internal Medicine

## 2017-07-25 DIAGNOSIS — K922 Gastrointestinal hemorrhage, unspecified: Secondary | ICD-10-CM | POA: Diagnosis not present

## 2017-07-25 DIAGNOSIS — I130 Hypertensive heart and chronic kidney disease with heart failure and stage 1 through stage 4 chronic kidney disease, or unspecified chronic kidney disease: Secondary | ICD-10-CM | POA: Diagnosis not present

## 2017-07-25 DIAGNOSIS — I4821 Permanent atrial fibrillation: Secondary | ICD-10-CM

## 2017-07-25 DIAGNOSIS — I2510 Atherosclerotic heart disease of native coronary artery without angina pectoris: Secondary | ICD-10-CM | POA: Diagnosis not present

## 2017-07-25 DIAGNOSIS — I50813 Acute on chronic right heart failure: Secondary | ICD-10-CM | POA: Diagnosis not present

## 2017-07-25 DIAGNOSIS — N183 Chronic kidney disease, stage 3 (moderate): Secondary | ICD-10-CM | POA: Diagnosis not present

## 2017-07-25 DIAGNOSIS — I5043 Acute on chronic combined systolic (congestive) and diastolic (congestive) heart failure: Secondary | ICD-10-CM | POA: Diagnosis not present

## 2017-07-25 MED ORDER — RIVAROXABAN 15 MG PO TABS: 15.0000 mg | ORAL_TABLET | Freq: Every day | ORAL | 6 refills | Status: DC

## 2017-07-25 MED ORDER — RIVAROXABAN 15 MG PO TABS: 15.0000 mg | ORAL_TABLET | Freq: Every day | ORAL | 1 refills | Status: DC

## 2017-07-25 NOTE — Addendum Note (Signed)
Addended by: Rodman Key on: 07/25/2017 11:02 AM   Modules accepted: Orders

## 2017-07-25 NOTE — Telephone Encounter (Signed)
Received message from Lucio Edward (GI) Pt is going to be contacted from GI department to sched capsule endosopy He said that it was OK to resume anticoagulation  WIll need to keep close follow up of Hgb and look for bleeding Resume    Set up for CBC in 3 weeks

## 2017-07-25 NOTE — Addendum Note (Signed)
Addended by: Erskine Emery on: 07/25/2017 10:39 AM   Modules accepted: Orders

## 2017-07-25 NOTE — Telephone Encounter (Signed)
Xarelto 15 mg once daily has been reordered. Confirmed with patient's sister (DPR) that he has these tablets at home, prescription has refills until 05/2018. Pt will restart Xarelto today and come for CBC on 08/17/17.  (appt nearby same day) Advised patient needs to monitor closely for signs of bleeding.

## 2017-07-26 ENCOUNTER — Encounter: Payer: Self-pay | Admitting: Family Medicine

## 2017-07-30 ENCOUNTER — Encounter (HOSPITAL_COMMUNITY): Payer: Self-pay | Admitting: *Deleted

## 2017-07-30 ENCOUNTER — Other Ambulatory Visit: Payer: Self-pay

## 2017-07-30 ENCOUNTER — Ambulatory Visit (INDEPENDENT_AMBULATORY_CARE_PROVIDER_SITE_OTHER): Payer: Medicare Other | Admitting: Orthopedic Surgery

## 2017-07-30 ENCOUNTER — Encounter (INDEPENDENT_AMBULATORY_CARE_PROVIDER_SITE_OTHER): Payer: Self-pay | Admitting: Orthopedic Surgery

## 2017-07-30 ENCOUNTER — Emergency Department (HOSPITAL_COMMUNITY)
Admission: EM | Admit: 2017-07-30 | Discharge: 2017-07-31 | Disposition: A | Discharge status: Home / Self Care | Payer: Medicare Other | Attending: Emergency Medicine | Admitting: Emergency Medicine

## 2017-07-30 VITALS — Ht 75.0 in | Wt 266.0 lb

## 2017-07-30 DIAGNOSIS — Z5321 Procedure and treatment not carried out due to patient leaving prior to being seen by health care provider: Secondary | ICD-10-CM | POA: Diagnosis not present

## 2017-07-30 DIAGNOSIS — I5043 Acute on chronic combined systolic (congestive) and diastolic (congestive) heart failure: Secondary | ICD-10-CM | POA: Diagnosis not present

## 2017-07-30 DIAGNOSIS — I50813 Acute on chronic right heart failure: Secondary | ICD-10-CM | POA: Diagnosis not present

## 2017-07-30 DIAGNOSIS — L97512 Non-pressure chronic ulcer of other part of right foot with fat layer exposed: Secondary | ICD-10-CM

## 2017-07-30 DIAGNOSIS — K625 Hemorrhage of anus and rectum: Secondary | ICD-10-CM | POA: Insufficient documentation

## 2017-07-30 DIAGNOSIS — N183 Chronic kidney disease, stage 3 (moderate): Secondary | ICD-10-CM | POA: Diagnosis not present

## 2017-07-30 DIAGNOSIS — I130 Hypertensive heart and chronic kidney disease with heart failure and stage 1 through stage 4 chronic kidney disease, or unspecified chronic kidney disease: Secondary | ICD-10-CM | POA: Diagnosis not present

## 2017-07-30 DIAGNOSIS — K922 Gastrointestinal hemorrhage, unspecified: Secondary | ICD-10-CM | POA: Diagnosis not present

## 2017-07-30 DIAGNOSIS — I872 Venous insufficiency (chronic) (peripheral): Secondary | ICD-10-CM | POA: Diagnosis not present

## 2017-07-30 DIAGNOSIS — I2510 Atherosclerotic heart disease of native coronary artery without angina pectoris: Secondary | ICD-10-CM | POA: Diagnosis not present

## 2017-07-30 LAB — CBC
HCT: 30.5 % — ABNORMAL LOW (ref 39.0–52.0)
HEMOGLOBIN: 9.1 g/dL — AB (ref 13.0–17.0)
MCH: 27.7 pg (ref 26.0–34.0)
MCHC: 29.8 g/dL — ABNORMAL LOW (ref 30.0–36.0)
MCV: 93 fL (ref 78.0–100.0)
Platelets: 187 10*3/uL (ref 150–400)
RBC: 3.28 MIL/uL — AB (ref 4.22–5.81)
RDW: 16.6 % — ABNORMAL HIGH (ref 11.5–15.5)
WBC: 8 10*3/uL (ref 4.0–10.5)

## 2017-07-30 LAB — ABO/RH: ABO/RH(D): O POS

## 2017-07-30 LAB — COMPREHENSIVE METABOLIC PANEL
ALK PHOS: 107 U/L (ref 38–126)
ALT: 13 U/L — ABNORMAL LOW (ref 17–63)
ANION GAP: 11 (ref 5–15)
AST: 18 U/L (ref 15–41)
Albumin: 3.8 g/dL (ref 3.5–5.0)
BILIRUBIN TOTAL: 1.5 mg/dL — AB (ref 0.3–1.2)
BUN: 54 mg/dL — ABNORMAL HIGH (ref 6–20)
CALCIUM: 9.4 mg/dL (ref 8.9–10.3)
CO2: 26 mmol/L (ref 22–32)
Chloride: 96 mmol/L — ABNORMAL LOW (ref 101–111)
Creatinine, Ser: 2.58 mg/dL — ABNORMAL HIGH (ref 0.61–1.24)
GFR calc Af Amer: 26 mL/min — ABNORMAL LOW (ref >60)
GFR calc non Af Amer: 23 mL/min — ABNORMAL LOW (ref >60)
Glucose, Bld: 108 mg/dL — ABNORMAL HIGH (ref 65–99)
Potassium: 3.7 mmol/L (ref 3.5–5.1)
Sodium: 133 mmol/L — ABNORMAL LOW (ref 135–145)
TOTAL PROTEIN: 7.7 g/dL (ref 6.5–8.1)

## 2017-07-30 LAB — TYPE AND SCREEN
ABO/RH(D): O POS
ANTIBODY SCREEN: NEGATIVE

## 2017-07-30 MED ORDER — DOXYCYCLINE HYCLATE 100 MG PO TABS: 100.0000 mg | ORAL_TABLET | Freq: Two times a day (BID) | ORAL | 0 refills | Status: DC

## 2017-07-30 NOTE — ED Triage Notes (Signed)
Pt reports bright red rectal bleeding x 1 today at 1700, midline abdominal pain, no n/v. Pt denies dizziness, cp, or sob. Pt had gi bleed 3 weeks ago and evaluated for the same. Xarelto was restarted on Thursday.   Pt/family aware to notify nurse first of any change in condition or further episodes of bleeding.

## 2017-07-30 NOTE — Patient Outreach (Signed)
Transition of care:  Placed call to patient and spoke with sister, [name]. Sister reports that patient saw Dr.Duda today and the he has a pressure ulcer on the bottom of the right foot. Sister described ulcer as the size of a 50 cent piece with hole in the middle the size of a pencil eraser down to the bone. Sister is concerned that patient will lose foot. Patient already has had toes amputated from this foot already.  Sister reports patient has follow up GI study planned in 1 week.  Patient has nephrology follow up planned for 08/02/2017.  Sister reports home health nurse coming tomorrow to see patient.   PLAN: home visit already planned for this week. Reminded sister of pending appointment and she confirmed.   Tomasa Rand, RN, BSN, CEN [facility] [employer] [phone]
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
## 2017-07-31 ENCOUNTER — Ambulatory Visit: Payer: Self-pay

## 2017-07-31 ENCOUNTER — Telehealth: Payer: Self-pay | Admitting: Internal Medicine

## 2017-07-31 ENCOUNTER — Telehealth: Payer: Self-pay | Admitting: Gastroenterology

## 2017-07-31 NOTE — Telephone Encounter (Signed)
Spoke with Pt's wife  Stop Xarelto.  Discuss with GI  She called GI   Waiting to hear back  BMET yesterday  Cr is increased   REcomm:  Metalozone only 1x per wk  WIll see Dr Lorrene Reid next week  THursday  Get labs then \

## 2017-07-31 NOTE — Telephone Encounter (Signed)
Patient sister states pt was just put back on BT xarelto last Thursday and yesterday 6.10.19 pt started passing a lot of blood and went to the ER. Pt sister wants to know if pt should stop taking BT till his appt with Dr.Stark on 6.18.19.

## 2017-07-31 NOTE — Telephone Encounter (Signed)
Nevin Bloodgood see phone notes from Cardiology from today as well.  GI bleeding after starting xeralto.  Capsule is scheduled for next week. From a GI standpoint please advise on holding xeralto.  Dr. Harrington Challenger from cardiology is also reviewing.

## 2017-07-31 NOTE — Telephone Encounter (Signed)
New Message   Pt c/o medication issue:  1. Name of Medication: Xarelto  2. How are you currently taking this medication (dosage and times per day)? Take 1 tablet (15 mg total) by mouth daily with supper.  3. Are you having a reaction (difficulty breathing--STAT)? yes  4. What is your medication issue? Pt is having blood in stool again and they want to know what they should do about taking the Xarelto

## 2017-07-31 NOTE — Telephone Encounter (Signed)
Left message for patient to call back  

## 2017-07-31 NOTE — ED Notes (Signed)
Follow up call made will follow up w pcp or return to ed  07/31/17  0845 s Mariela Rex rn

## 2017-07-31 NOTE — Telephone Encounter (Signed)
Blood in toilet yesterday. A lot of blood, bright red x 1.He asked to be taken to ER. After sitting for 6 hours and didn't see provider, they left.  Skipped xarelto yesterday due to bleeding episode.  Weight continues to decrease. Today is 261, still a lot of swelling in feet and legs. abd is not swollen. Saw Dr. Sharol Given yesterday for sore on right foot under toes with a hole in it and it goes all the way to the bone.  Started on antibiotics.  Patient's sister wants to know if pt should continue Xarelto or hold for now.  Having capsule study on 6/18. Advised I would forward to Dr. Harrington Challenger for recommendations on Xarelto. Also advised she should contact GI to inform and ask for their recommendation on taking Xarelto since another bleeding episode has occurred.

## 2017-07-31 NOTE — ED Notes (Signed)
Called Pt for vitals no answer. 

## 2017-08-01 ENCOUNTER — Telehealth (INDEPENDENT_AMBULATORY_CARE_PROVIDER_SITE_OTHER): Payer: Self-pay | Admitting: Radiology

## 2017-08-01 DIAGNOSIS — I5043 Acute on chronic combined systolic (congestive) and diastolic (congestive) heart failure: Secondary | ICD-10-CM | POA: Diagnosis not present

## 2017-08-01 DIAGNOSIS — I130 Hypertensive heart and chronic kidney disease with heart failure and stage 1 through stage 4 chronic kidney disease, or unspecified chronic kidney disease: Secondary | ICD-10-CM | POA: Diagnosis not present

## 2017-08-01 DIAGNOSIS — I50813 Acute on chronic right heart failure: Secondary | ICD-10-CM | POA: Diagnosis not present

## 2017-08-01 DIAGNOSIS — I2510 Atherosclerotic heart disease of native coronary artery without angina pectoris: Secondary | ICD-10-CM | POA: Diagnosis not present

## 2017-08-01 DIAGNOSIS — N183 Chronic kidney disease, stage 3 (moderate): Secondary | ICD-10-CM | POA: Diagnosis not present

## 2017-08-01 DIAGNOSIS — K922 Gastrointestinal hemorrhage, unspecified: Secondary | ICD-10-CM | POA: Diagnosis not present

## 2017-08-01 NOTE — Telephone Encounter (Signed)
I spoke with the patient's sister and he has not had any further bleeding.  She states she has instructions from Cardiology to hold [facility].  She will have him keep his appt for 08/07/17 capsule endoscopy. . They understand to return to the ED for any more GI bleeding.

## 2017-08-01 NOTE — Telephone Encounter (Signed)
Santiago Glad called, and she is the [facility] RN who sees patient for CHF now.  She says patient now has wound that Dr Sharol Given saw him for on Monday, and she needs orders for this if they are to treat the wound as well.  You can call verbal orders to her if you want.

## 2017-08-01 NOTE — Telephone Encounter (Signed)
Sheri, his hgb wasn't down in the ED despite the bleeding. We don't know what is bleeding at this point but if bleeding "a lot" then his hgb will surely drop. He should probably hold the xarelto but Cardiology can let us know the risk of doing so.

## 2017-08-02 ENCOUNTER — Other Ambulatory Visit: Payer: Medicare Other

## 2017-08-02 DIAGNOSIS — I5042 Chronic combined systolic (congestive) and diastolic (congestive) heart failure: Secondary | ICD-10-CM | POA: Diagnosis not present

## 2017-08-02 DIAGNOSIS — L97519 Non-pressure chronic ulcer of other part of right foot with unspecified severity: Secondary | ICD-10-CM | POA: Diagnosis not present

## 2017-08-02 DIAGNOSIS — D631 Anemia in chronic kidney disease: Secondary | ICD-10-CM | POA: Diagnosis not present

## 2017-08-02 DIAGNOSIS — I739 Peripheral vascular disease, unspecified: Secondary | ICD-10-CM | POA: Diagnosis not present

## 2017-08-02 DIAGNOSIS — I129 Hypertensive chronic kidney disease with stage 1 through stage 4 chronic kidney disease, or unspecified chronic kidney disease: Secondary | ICD-10-CM | POA: Diagnosis not present

## 2017-08-02 DIAGNOSIS — N184 Chronic kidney disease, stage 4 (severe): Secondary | ICD-10-CM | POA: Diagnosis not present

## 2017-08-02 DIAGNOSIS — Z95828 Presence of other vascular implants and grafts: Secondary | ICD-10-CM | POA: Diagnosis not present

## 2017-08-02 DIAGNOSIS — K922 Gastrointestinal hemorrhage, unspecified: Secondary | ICD-10-CM | POA: Diagnosis not present

## 2017-08-02 DIAGNOSIS — E875 Hyperkalemia: Secondary | ICD-10-CM | POA: Diagnosis not present

## 2017-08-02 DIAGNOSIS — N183 Chronic kidney disease, stage 3 (moderate): Secondary | ICD-10-CM | POA: Diagnosis not present

## 2017-08-02 NOTE — Telephone Encounter (Signed)
Advised pt started on doxycycline.  He is to wash the foot with dial soap and water dry dressing and compression sock round the clock.

## 2017-08-03 ENCOUNTER — Ambulatory Visit (INDEPENDENT_AMBULATORY_CARE_PROVIDER_SITE_OTHER): Payer: Medicare Other | Admitting: Family Medicine

## 2017-08-03 ENCOUNTER — Other Ambulatory Visit: Payer: Self-pay

## 2017-08-03 ENCOUNTER — Encounter: Payer: Self-pay | Admitting: Family Medicine

## 2017-08-03 DIAGNOSIS — N184 Chronic kidney disease, stage 4 (severe): Secondary | ICD-10-CM | POA: Diagnosis not present

## 2017-08-03 DIAGNOSIS — J441 Chronic obstructive pulmonary disease with (acute) exacerbation: Secondary | ICD-10-CM

## 2017-08-03 DIAGNOSIS — I482 Chronic atrial fibrillation: Secondary | ICD-10-CM | POA: Diagnosis not present

## 2017-08-03 DIAGNOSIS — I5042 Chronic combined systolic (congestive) and diastolic (congestive) heart failure: Secondary | ICD-10-CM

## 2017-08-03 DIAGNOSIS — I4821 Permanent atrial fibrillation: Secondary | ICD-10-CM

## 2017-08-03 DIAGNOSIS — I2510 Atherosclerotic heart disease of native coronary artery without angina pectoris: Secondary | ICD-10-CM | POA: Diagnosis not present

## 2017-08-03 DIAGNOSIS — E11610 Type 2 diabetes mellitus with diabetic neuropathic arthropathy: Secondary | ICD-10-CM

## 2017-08-03 DIAGNOSIS — Z7901 Long term (current) use of anticoagulants: Secondary | ICD-10-CM | POA: Diagnosis not present

## 2017-08-03 MED ORDER — FLUTICASONE-UMECLIDIN-VILANT 100-62.5-25 MCG/INH IN AEPB: 1.0000 | INHALATION_SPRAY | Freq: Every day | RESPIRATORY_TRACT | 3 refills | Status: DC

## 2017-08-03 MED ORDER — BUDESONIDE-FORMOTEROL FUMARATE 80-4.5 MCG/ACT IN AERO: 2.0000 | INHALATION_SPRAY | RESPIRATORY_TRACT | 3 refills | Status: AC | PRN

## 2017-08-03 NOTE — Patient Outreach (Signed)
[facility]) Care Management   08/03/2017  Terry Martinez. 04/20/1940 [id number]  Terry Martinez Terry Martinez. is an 77 y.o. male  Subjective: Patient reports he is feeling better. States he is at his lowest weight in a very long time. Reports he continues to have swelling in the lower legs. Reports pressure ulcer to the right foot is getting better since being on antibiotics for 4 days. Reports 1 episodes of rectal bleeding this week. Went to the ED and stayed for hours and left without being seen, Patient reports that he saw the kidney doctor yesterday and will see Primary MD today. Reports he needs his paperwork signed to get his new shoes.    Objective:  Awake and alert. Walking with walker.  Today's Vitals   08/03/17 0902 08/03/17 0905  BP: 122/64   Pulse: 81   Resp: 18   SpO2: 97%   Weight: 263 lb (119.3 kg)   PainSc:  0-No pain   Review of Systems  Constitutional: Negative.   HENT: Negative.   Eyes: Negative.   Respiratory: Positive for shortness of breath.   Cardiovascular: Positive for leg swelling. Negative for claudication.  Gastrointestinal: Negative.   Genitourinary: Negative.   Musculoskeletal: Negative.   Skin:       Right toe pressure sore  Neurological: Negative.   Endo/Heme/Allergies: Bruises/bleeds easily.  Psychiatric/Behavioral: Negative.     Physical Exam  Constitutional: He is oriented to person, place, and time. He appears well-developed and well-nourished.  Cardiovascular: Normal rate, normal heart sounds and intact distal pulses.  Respiratory: Effort normal and breath sounds normal.  GI: Soft. Bowel sounds are normal.  Musculoskeletal: Normal range of motion. He exhibits edema.  3 plus edema  Neurological: He is alert and oriented to person, place, and time.  Skin: Skin is warm and dry.  Right foot with a dime size wound underneath toes. No drainage. Pink center. Wearing medical socks per Dr. Sharol Given. Missing right great and 2nd toe, missing  left 4th toe,  Psychiatric: He has a normal mood and affect. His behavior is normal. Judgment and thought content normal.    Encounter Medications:   Outpatient Encounter Medications as of 08/03/2017  Medication Sig Note  . acetaminophen (TYLENOL) 500 MG tablet Take 1,000 mg by mouth every 8 (eight) hours as needed for moderate pain or headache.    . allopurinol (ZYLOPRIM) 100 MG tablet TAKE ONE TABLET BY MOUTH ONCE DAILY IN THE EVENING (Patient taking differently: Take 100 mg by mouth daily. )   . Budesonide-Formoterol Fumarate (SYMBICORT IN) Inhale 2 puffs into the lungs as needed (COPD).   . Carboxymethylcellul-Glycerin (LUBRICATING EYE DROPS OP) Place 1 drop into both eyes 4 (four) times daily as needed (dry eyes).    . Cholecalciferol (VITAMIN D-3 PO) Take 1 tablet by mouth every morning.    [name] doxycycline (VIBRA-TABS) 100 MG tablet Take 1 tablet (100 mg total) by mouth 2 (two) times daily.   . Fluticasone-Umeclidin-Vilant (TRELEGY ELLIPTA) 100-62.5-25 MCG/INH AEPB Inhale 1 Dose into the lungs daily.   . hydrALAZINE (APRESOLINE) 25 MG tablet Take 0.5 tablets (12.5 mg total) by mouth every 8 (eight) hours.   . metolazone (ZAROXOLYN) 2.5 MG tablet Take one tablet 2 times per week, 30 minutes before torsemide for swelling 08/03/2017: Taking once a week on Mondays.   . metroNIDAZOLE (METROGEL) 1 % gel Apply 1 application topically daily as needed (SKIN IRRITATION/ROSACEA.).    [name] mupirocin cream (BACTROBAN) 2 % Apply 1 application topically  2 (two) times daily.   . polyethylene glycol powder (GLYCOLAX/MIRALAX) powder DISSOLVE 17G (1 CAPFUL) OF POWDER IN 8 OUNCES OF LIQUID AND DRINK 2 TIMES PER DAY AS NEEDED FOR MILD CONSTIPATION (Patient taking differently: DISSOLVE 17G (1 CAPFUL) OF POWDER IN 8 OUNCES OF LIQUID AND DRINK Daily 3/4 of a cap ( Must Have every day))   . potassium chloride SA (K-DUR,KLOR-CON) 20 MEQ tablet Take 3 tablets (60 mEq total) by mouth 2 (two) times daily.   . pravastatin  (PRAVACHOL) 40 MG tablet Take 40 mg by mouth daily.   . pravastatin (PRAVACHOL) 40 MG tablet TAKE 1 TABLET BY MOUTH ONCE DAILY AT  6PM   . spironolactone (ALDACTONE) 25 MG tablet Take 1 tablet (25 mg total) by mouth daily.   [name] torsemide (DEMADEX) 20 MG tablet Take 3 tablets (60 mg total) by mouth 2 (two) times daily.   . vitamin C (ASCORBIC ACID) 500 MG tablet Take 500 mg by mouth daily.   . metoprolol succinate (TOPROL-XL) 25 MG 24 hr tablet Take 0.5 tablets (12.5 mg total) by mouth daily.   . nitroGLYCERIN (NITROSTAT) 0.4 MG SL tablet Place 1 tablet (0.4 mg total) under the tongue every 5 (five) minutes as needed for chest pain. (Patient not taking: Reported on 08/03/2017)   . Rivaroxaban (XARELTO) 15 MG TABS tablet Take 1 tablet (15 mg total) by mouth daily with supper. (Patient not taking: Reported on 08/03/2017)    No facility-administered encounter medications on file as of 08/03/2017.     Functional Status:   In your present state of health, do you have any difficulty performing the following activities: 07/09/2017 06/07/2017  Hearing? N N  Vision? N N  Difficulty concentrating or making decisions? N N  Walking or climbing stairs? Y Y  Dressing or bathing? N N  Doing errands, shopping? [name] Donning  Preparing Food and eating ? N -  Using the Toilet? N -  In the past six months, have you accidently leaked urine? - -  Do you have problems with loss of bowel control? N -  Managing your Medications? Y -  Comment sister manages medications. -  Managing your Finances? N -  Housekeeping or managing your Housekeeping? N -  Comment sister manages housekeeping -  Some recent data might be hidden    Fall/Depression Screening:    Fall Risk  07/10/2017 07/09/2017 05/21/2017  Falls in the past year? Yes Yes No  Number falls in past yr: 1 1 -  Injury with Fall? No No -  Risk for fall due to : - History of fall(s) -  Follow up - Falls evaluation completed;Falls prevention discussed -   PHQ 2/9 Scores  07/10/2017 07/09/2017 05/21/2017 05/21/2017 11/08/2016 10/18/2016 11/16/2015  PHQ - 2 Score 0 0 0 0 0 0 0  PHQ- 9 Score - - - - - - 0    Assessment:   (1) heart failure: decreased weight but still has 3 plus edema to legs. Legs are very tight and swollen. Wear black compression socks.  Patient is following low salt diet and taking medications as prescribed. Reviewed daily weight log. (2) healing pressure ulcer to the right foot. Wear compression socks per MD order. No drainage to wound. Right forearm skin tear healed.  (3) COPD: is currently taking all medications as prescribed.  (4) rectal  Bleeding: Currently off anticoagulants after episode of rectal bleeding this week. Has endo planned for next week.  Plan:  (1)encouraged patient and sister  to continue to follow plan of care. Reviewed heart failure zones and when to call MD. Encouraged sister to discuss fluid overload with MD today at office visit (2) reviewed reasons to call MD for increased redness, fever, or drainage. Encouraged patient to continue antibiotic and wear compression socks.  (3) Reviewed plan of care and COPD zones. (4) reviewed signs of bleeding and pending follow up appointments.   THN CM Care Plan Problem One     Most Recent Value  Care Plan Problem One  Recent admission for COPD and CHF  Role Documenting the Problem One  Care Management Coordinator  Care Plan for Problem One  Active  THN Long Term Goal   Patient will report no readmissions for heart failure or COPD in the next 91 days.   THN Long Term Goal Start Date  07/12/17 Sudie Grumbling restarted due to readmission]  Interventions for Problem One Long Term Goal  home visit done. reviewed progress and daily weights  THN CM Short Term Goal #1   Patient will verbalize arrival of nebulizer and rolling walker in the next 2 days.   THN CM Short Term Goal #1 Start Date  05/15/17  [facility] CM Short Term Goal #1 Met Date  05/21/17  THN CM Short Term Goal #2   Patient will verbalize weighing  daily for the next 30 days.   THN CM Short Term Goal #2 Start Date  06/20/17 [date restarted. ]  THN CM Short Term Goal #2 Met Date  06/26/17  [facility] CM Short Term Goal #3  Sister will reports the arrival in the next 7 days.   THN CM Short Term Goal #3 Start Date  06/20/17  THN CM Short Term Goal #3 Met Date  07/05/17  THN CM Short Term Goal #4  Patient will report no new bleeding in the next 30 days.  THN CM Short Term Goal #4 Start Date  07/05/17  Interventions for Short Term Goal #4  Reviewed signs of bleeding and plan if this occurs. Reviewed pending ENDO exam     [facility] CM Care Plan Problem Two     Most Recent Value  Care Plan Problem Two  Alteration in skin intergrity related to wounds  Role Documenting the Problem Two  Care Management Coordinator  Care Plan for Problem Two  Active  THN CM Short Term Goal #1   Patient will report increased healing to right foot and right forearm wounds in the next 30 days.  THN CM Short Term Goal #1 Start Date  07/09/17  Interventions for Short Term Goal #2   Reasessed right foot, encouraged use of new shoes if paper work signed today. Right forearm healed.      Next Transition of care call in 1 week.  Tomasa Rand, RN, BSN, CEN [facility] [employer] [phone]

## 2017-08-03 NOTE — Assessment & Plan Note (Signed)
Xarelto currently being held.  His capsule endoscopy scheduled for next week.  Has had ongoing bleeding with anticoagulation.  May need to hold indefinitely.

## 2017-08-03 NOTE — Patient Instructions (Signed)
It was good to see you again today.  I have refilled your COPD medications.  Make sure to follow-up with Dr. Sharol Given and keep taking doxycycline as you have been.  I will fill out the paperwork for your shoes for your feet.  Come back and see me in about 3 months.

## 2017-08-03 NOTE — Assessment & Plan Note (Signed)
Agree with Trelegy and Symbicort based on severe obstruction found on spirometry.  Grade 3 gold criteria for COPD.  He is doing very well with this.

## 2017-08-03 NOTE — Assessment & Plan Note (Signed)
Saw nephrologist yesterday.  Told he was doing well.  Kidney disease will likely eventually limit diuretic usage/efficacy.
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
## 2017-08-03 NOTE — Assessment & Plan Note (Signed)
Bilateral lower extremity edema noted.  He is not taking his metolazone or his diuretic today or yesterday.  He was out all day yesterday and has come in to see me this morning.  He will take his diuretic when he gets home today.  Did not want to have an accident of urinary incontinence in public. -He is now only on once weekly metolazone which is reduced due to changes in his GFR.

## 2017-08-03 NOTE — Assessment & Plan Note (Signed)
Holding due to bleeding.

## 2017-08-03 NOTE — Assessment & Plan Note (Signed)
No actual diagnosis of diabetes. Followed by orthopedics for this.  On doxy.  Probed foot to bone.  Plan is for him to FU with Ortho in 2 weeks, appt already scheduled.

## 2017-08-06 ENCOUNTER — Other Ambulatory Visit: Payer: Self-pay

## 2017-08-06 ENCOUNTER — Telehealth: Payer: Self-pay | Admitting: Internal Medicine

## 2017-08-06 DIAGNOSIS — J441 Chronic obstructive pulmonary disease with (acute) exacerbation: Secondary | ICD-10-CM

## 2017-08-06 DIAGNOSIS — I5043 Acute on chronic combined systolic (congestive) and diastolic (congestive) heart failure: Secondary | ICD-10-CM | POA: Diagnosis not present

## 2017-08-06 DIAGNOSIS — N183 Chronic kidney disease, stage 3 (moderate): Secondary | ICD-10-CM | POA: Diagnosis not present

## 2017-08-06 DIAGNOSIS — K922 Gastrointestinal hemorrhage, unspecified: Secondary | ICD-10-CM | POA: Diagnosis not present

## 2017-08-06 DIAGNOSIS — I2510 Atherosclerotic heart disease of native coronary artery without angina pectoris: Secondary | ICD-10-CM | POA: Diagnosis not present

## 2017-08-06 DIAGNOSIS — I130 Hypertensive heart and chronic kidney disease with heart failure and stage 1 through stage 4 chronic kidney disease, or unspecified chronic kidney disease: Secondary | ICD-10-CM | POA: Diagnosis not present

## 2017-08-06 DIAGNOSIS — I50813 Acute on chronic right heart failure: Secondary | ICD-10-CM | POA: Diagnosis not present

## 2017-08-06 MED ORDER — FLUTICASONE-UMECLIDIN-VILANT 100-62.5-25 MCG/INH IN AEPB: 1.0000 | INHALATION_SPRAY | Freq: Every day | RESPIRATORY_TRACT | 3 refills | Status: AC

## 2017-08-06 NOTE — Telephone Encounter (Signed)
Message is from [place] at [employer]. Will forward to Dr. Harrington Challenger as update/FYI.

## 2017-08-06 NOTE — Telephone Encounter (Signed)
Pt saw Dr Lorrene Reid 6.13.19 and the creatinine was 2.2 with reduction in metolazone and potassium was 4.1 and they will set the pt up for IV Sera heme for low hemoglobin and [place]

## 2017-08-07 ENCOUNTER — Encounter: Payer: Self-pay | Admitting: Gastroenterology

## 2017-08-07 ENCOUNTER — Ambulatory Visit (HOSPITAL_COMMUNITY)
Admission: RE | Admit: 2017-08-07 | Discharge: 2017-08-07 | Disposition: A | Discharge status: Home / Self Care | Payer: Medicare Other | Source: Ambulatory Visit | Attending: Gastroenterology | Admitting: Gastroenterology

## 2017-08-07 ENCOUNTER — Other Ambulatory Visit: Payer: Self-pay

## 2017-08-07 ENCOUNTER — Encounter (HOSPITAL_COMMUNITY)
Admission: RE | Disposition: A | Discharge status: Home / Self Care | Payer: Self-pay | Source: Ambulatory Visit | Attending: Gastroenterology

## 2017-08-07 DIAGNOSIS — K649 Unspecified hemorrhoids: Secondary | ICD-10-CM | POA: Insufficient documentation

## 2017-08-07 DIAGNOSIS — K921 Melena: Secondary | ICD-10-CM | POA: Diagnosis not present

## 2017-08-07 DIAGNOSIS — K317 Polyp of stomach and duodenum: Secondary | ICD-10-CM | POA: Insufficient documentation

## 2017-08-07 DIAGNOSIS — Z7901 Long term (current) use of anticoagulants: Secondary | ICD-10-CM | POA: Insufficient documentation

## 2017-08-07 HISTORY — PX: GIVENS CAPSULE STUDY: SHX5432

## 2017-08-07 SURGERY — IMAGING PROCEDURE, GI TRACT, INTRALUMINAL, VIA CAPSULE
Anesthesia: LOCAL

## 2017-08-07 SURGICAL SUPPLY — 1 items: TOWEL COTTON PACK 4EA (MISCELLANEOUS) ×4 IMPLANT

## 2017-08-07 NOTE — Patient Outreach (Signed)
Transition of care call: Today's Vitals   08/07/17 1631  Weight: 262 lb (118.8 kg)    Placed call to patient and spoke wuith sister, [name]. Sister reports that patient is doing well.. Reports patient had the GI capsule placed today for bleeding.  Reports he tolerated that well. States wound to foot is healing. Denies any other new concerns today. Patient has follow up planned with Dr. Sharol Given next week.  Sister reports patient continues to have swelling. No recent weight gain.   PLAN: Patient has completed transition of care calls and no readmission in 30 days.  Will plan to follow up with patient in 3 weeks. Encouraged sister to call me sooner if needed.  Tomasa Rand, RN, BSN, CEN [facility] [employer] [phone]

## 2017-08-07 NOTE — Progress Notes (Signed)
Patient ingested givens capsule at 0800 today. Will return equipment to [facility] admitting 08/08/17. Verbal and written instructions given. Patient expresses understanding and agrees.

## 2017-08-08 ENCOUNTER — Encounter (HOSPITAL_COMMUNITY): Payer: Self-pay | Admitting: Gastroenterology

## 2017-08-08 DIAGNOSIS — I5043 Acute on chronic combined systolic (congestive) and diastolic (congestive) heart failure: Secondary | ICD-10-CM | POA: Diagnosis not present

## 2017-08-08 DIAGNOSIS — N183 Chronic kidney disease, stage 3 (moderate): Secondary | ICD-10-CM | POA: Diagnosis not present

## 2017-08-08 DIAGNOSIS — I2510 Atherosclerotic heart disease of native coronary artery without angina pectoris: Secondary | ICD-10-CM | POA: Diagnosis not present

## 2017-08-08 DIAGNOSIS — K922 Gastrointestinal hemorrhage, unspecified: Secondary | ICD-10-CM | POA: Diagnosis not present

## 2017-08-08 DIAGNOSIS — I130 Hypertensive heart and chronic kidney disease with heart failure and stage 1 through stage 4 chronic kidney disease, or unspecified chronic kidney disease: Secondary | ICD-10-CM | POA: Diagnosis not present

## 2017-08-08 DIAGNOSIS — I50813 Acute on chronic right heart failure: Secondary | ICD-10-CM | POA: Diagnosis not present

## 2017-08-09 DIAGNOSIS — I5043 Acute on chronic combined systolic (congestive) and diastolic (congestive) heart failure: Secondary | ICD-10-CM | POA: Diagnosis not present

## 2017-08-09 DIAGNOSIS — K922 Gastrointestinal hemorrhage, unspecified: Secondary | ICD-10-CM | POA: Diagnosis not present

## 2017-08-09 DIAGNOSIS — I2510 Atherosclerotic heart disease of native coronary artery without angina pectoris: Secondary | ICD-10-CM | POA: Diagnosis not present

## 2017-08-09 DIAGNOSIS — I50813 Acute on chronic right heart failure: Secondary | ICD-10-CM | POA: Diagnosis not present

## 2017-08-09 DIAGNOSIS — I130 Hypertensive heart and chronic kidney disease with heart failure and stage 1 through stage 4 chronic kidney disease, or unspecified chronic kidney disease: Secondary | ICD-10-CM | POA: Diagnosis not present

## 2017-08-09 DIAGNOSIS — N183 Chronic kidney disease, stage 3 (moderate): Secondary | ICD-10-CM | POA: Diagnosis not present

## 2017-08-13 DIAGNOSIS — I2510 Atherosclerotic heart disease of native coronary artery without angina pectoris: Secondary | ICD-10-CM | POA: Diagnosis not present

## 2017-08-13 DIAGNOSIS — N189 Chronic kidney disease, unspecified: Secondary | ICD-10-CM | POA: Diagnosis not present

## 2017-08-13 DIAGNOSIS — I130 Hypertensive heart and chronic kidney disease with heart failure and stage 1 through stage 4 chronic kidney disease, or unspecified chronic kidney disease: Secondary | ICD-10-CM | POA: Diagnosis not present

## 2017-08-13 DIAGNOSIS — I50813 Acute on chronic right heart failure: Secondary | ICD-10-CM | POA: Diagnosis not present

## 2017-08-13 DIAGNOSIS — D631 Anemia in chronic kidney disease: Secondary | ICD-10-CM | POA: Diagnosis not present

## 2017-08-13 DIAGNOSIS — N183 Chronic kidney disease, stage 3 (moderate): Secondary | ICD-10-CM | POA: Diagnosis not present

## 2017-08-13 DIAGNOSIS — I5043 Acute on chronic combined systolic (congestive) and diastolic (congestive) heart failure: Secondary | ICD-10-CM | POA: Diagnosis not present

## 2017-08-13 DIAGNOSIS — K922 Gastrointestinal hemorrhage, unspecified: Secondary | ICD-10-CM | POA: Diagnosis not present

## 2017-08-14 ENCOUNTER — Telehealth: Payer: Self-pay

## 2017-08-14 DIAGNOSIS — I5043 Acute on chronic combined systolic (congestive) and diastolic (congestive) heart failure: Secondary | ICD-10-CM | POA: Diagnosis not present

## 2017-08-14 DIAGNOSIS — I50813 Acute on chronic right heart failure: Secondary | ICD-10-CM | POA: Diagnosis not present

## 2017-08-14 DIAGNOSIS — I2510 Atherosclerotic heart disease of native coronary artery without angina pectoris: Secondary | ICD-10-CM | POA: Diagnosis not present

## 2017-08-14 DIAGNOSIS — I130 Hypertensive heart and chronic kidney disease with heart failure and stage 1 through stage 4 chronic kidney disease, or unspecified chronic kidney disease: Secondary | ICD-10-CM | POA: Diagnosis not present

## 2017-08-14 DIAGNOSIS — N183 Chronic kidney disease, stage 3 (moderate): Secondary | ICD-10-CM | POA: Diagnosis not present

## 2017-08-14 DIAGNOSIS — K922 Gastrointestinal hemorrhage, unspecified: Secondary | ICD-10-CM | POA: Diagnosis not present

## 2017-08-14 NOTE — Telephone Encounter (Signed)
Letter from [facility] was given to me from Dr. Mingo Amber along with a "Statement of Certifying Physician for Therapeutic Shoes" and an "Information from Medical Records of In-Person Visit". These two forms along with office notes from 08/03/2017 were faxed per letter to [phone]. Ottis Stain, CMA

## 2017-08-15 DIAGNOSIS — I5043 Acute on chronic combined systolic (congestive) and diastolic (congestive) heart failure: Secondary | ICD-10-CM | POA: Diagnosis not present

## 2017-08-15 DIAGNOSIS — I50813 Acute on chronic right heart failure: Secondary | ICD-10-CM | POA: Diagnosis not present

## 2017-08-15 DIAGNOSIS — K922 Gastrointestinal hemorrhage, unspecified: Secondary | ICD-10-CM | POA: Diagnosis not present

## 2017-08-15 DIAGNOSIS — N183 Chronic kidney disease, stage 3 (moderate): Secondary | ICD-10-CM | POA: Diagnosis not present

## 2017-08-15 DIAGNOSIS — I130 Hypertensive heart and chronic kidney disease with heart failure and stage 1 through stage 4 chronic kidney disease, or unspecified chronic kidney disease: Secondary | ICD-10-CM | POA: Diagnosis not present

## 2017-08-15 DIAGNOSIS — I2510 Atherosclerotic heart disease of native coronary artery without angina pectoris: Secondary | ICD-10-CM | POA: Diagnosis not present

## 2017-08-16 ENCOUNTER — Ambulatory Visit: Payer: Medicare Other | Admitting: Orthotics

## 2017-08-16 ENCOUNTER — Ambulatory Visit (INDEPENDENT_AMBULATORY_CARE_PROVIDER_SITE_OTHER): Payer: Medicare Other | Admitting: Orthopedic Surgery

## 2017-08-16 ENCOUNTER — Encounter (INDEPENDENT_AMBULATORY_CARE_PROVIDER_SITE_OTHER): Payer: Self-pay | Admitting: Orthopedic Surgery

## 2017-08-16 ENCOUNTER — Other Ambulatory Visit: Payer: Medicare Other | Admitting: *Deleted

## 2017-08-16 ENCOUNTER — Telehealth: Payer: Self-pay

## 2017-08-16 DIAGNOSIS — T189XXA Foreign body of alimentary tract, part unspecified, initial encounter: Secondary | ICD-10-CM

## 2017-08-16 DIAGNOSIS — M79675 Pain in left toe(s): Secondary | ICD-10-CM

## 2017-08-16 DIAGNOSIS — M216X9 Other acquired deformities of unspecified foot: Secondary | ICD-10-CM

## 2017-08-16 DIAGNOSIS — I739 Peripheral vascular disease, unspecified: Secondary | ICD-10-CM

## 2017-08-16 DIAGNOSIS — Z89419 Acquired absence of unspecified great toe: Secondary | ICD-10-CM

## 2017-08-16 DIAGNOSIS — I4821 Permanent atrial fibrillation: Secondary | ICD-10-CM

## 2017-08-16 DIAGNOSIS — I2510 Atherosclerotic heart disease of native coronary artery without angina pectoris: Secondary | ICD-10-CM

## 2017-08-16 DIAGNOSIS — M86171 Other acute osteomyelitis, right ankle and foot: Secondary | ICD-10-CM

## 2017-08-16 DIAGNOSIS — M79674 Pain in right toe(s): Secondary | ICD-10-CM

## 2017-08-16 DIAGNOSIS — I482 Chronic atrial fibrillation: Secondary | ICD-10-CM | POA: Diagnosis not present

## 2017-08-16 LAB — CBC
HEMATOCRIT: 27.3 % — AB (ref 37.5–51.0)
Hemoglobin: 8.8 g/dL — ABNORMAL LOW (ref 13.0–17.7)
MCH: 27.8 pg (ref 26.6–33.0)
MCHC: 32.2 g/dL (ref 31.5–35.7)
MCV: 86 fL (ref 79–97)
Platelets: 143 10*3/uL — ABNORMAL LOW (ref 150–450)
RBC: 3.17 x10E6/uL — AB (ref 4.14–5.80)
RDW: 17.2 % — ABNORMAL HIGH (ref 12.3–15.4)
WBC: 5.9 10*3/uL (ref 3.4–10.8)

## 2017-08-16 NOTE — Telephone Encounter (Signed)
-----   Message from Alfredia Ferguson, PA-C sent at 08/14/2017  2:56 PM EDT ----- Regarding: Capsule Capsule ready to review   It was incomplete - so  Anaily Ashbaugh - please call pt, and if has not passed the capsule have him come for KUB tomorrow
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
## 2017-08-16 NOTE — Telephone Encounter (Signed)
Wife and patient notified of the results.  He has not seen capsule.  He will come for a KUB in the am.  Follow up arrranged with Nicoletta Ba PA to discuss next steps 09/06/17

## 2017-08-16 NOTE — Progress Notes (Signed)
Patient here to p/u L5000 toe filler right and diabetic shoes

## 2017-08-16 NOTE — Telephone Encounter (Signed)
Left message for patient to call back  

## 2017-08-17 ENCOUNTER — Ambulatory Visit (INDEPENDENT_AMBULATORY_CARE_PROVIDER_SITE_OTHER)
Admission: RE | Admit: 2017-08-17 | Discharge: 2017-08-17 | Disposition: A | Discharge status: Home / Self Care | Payer: Medicare Other | Source: Ambulatory Visit | Attending: Gastroenterology | Admitting: Gastroenterology

## 2017-08-17 ENCOUNTER — Other Ambulatory Visit: Payer: Medicare Other

## 2017-08-17 ENCOUNTER — Ambulatory Visit: Payer: Medicare Other | Admitting: Family Medicine

## 2017-08-17 DIAGNOSIS — Z13810 Encounter for screening for upper gastrointestinal disorder: Secondary | ICD-10-CM | POA: Diagnosis not present

## 2017-08-17 DIAGNOSIS — T189XXA Foreign body of alimentary tract, part unspecified, initial encounter: Secondary | ICD-10-CM

## 2017-08-20 DIAGNOSIS — N183 Chronic kidney disease, stage 3 (moderate): Secondary | ICD-10-CM | POA: Diagnosis not present

## 2017-08-20 DIAGNOSIS — K922 Gastrointestinal hemorrhage, unspecified: Secondary | ICD-10-CM | POA: Diagnosis not present

## 2017-08-20 DIAGNOSIS — I5043 Acute on chronic combined systolic (congestive) and diastolic (congestive) heart failure: Secondary | ICD-10-CM | POA: Diagnosis not present

## 2017-08-20 DIAGNOSIS — I2510 Atherosclerotic heart disease of native coronary artery without angina pectoris: Secondary | ICD-10-CM | POA: Diagnosis not present

## 2017-08-20 DIAGNOSIS — I130 Hypertensive heart and chronic kidney disease with heart failure and stage 1 through stage 4 chronic kidney disease, or unspecified chronic kidney disease: Secondary | ICD-10-CM | POA: Diagnosis not present

## 2017-08-20 DIAGNOSIS — I50813 Acute on chronic right heart failure: Secondary | ICD-10-CM | POA: Diagnosis not present

## 2017-08-21 DIAGNOSIS — I130 Hypertensive heart and chronic kidney disease with heart failure and stage 1 through stage 4 chronic kidney disease, or unspecified chronic kidney disease: Secondary | ICD-10-CM | POA: Diagnosis not present

## 2017-08-21 DIAGNOSIS — N183 Chronic kidney disease, stage 3 (moderate): Secondary | ICD-10-CM | POA: Diagnosis not present

## 2017-08-21 DIAGNOSIS — I5043 Acute on chronic combined systolic (congestive) and diastolic (congestive) heart failure: Secondary | ICD-10-CM | POA: Diagnosis not present

## 2017-08-21 DIAGNOSIS — K922 Gastrointestinal hemorrhage, unspecified: Secondary | ICD-10-CM | POA: Diagnosis not present

## 2017-08-21 DIAGNOSIS — I50813 Acute on chronic right heart failure: Secondary | ICD-10-CM | POA: Diagnosis not present

## 2017-08-21 DIAGNOSIS — I2510 Atherosclerotic heart disease of native coronary artery without angina pectoris: Secondary | ICD-10-CM | POA: Diagnosis not present

## 2017-08-22 DIAGNOSIS — I50813 Acute on chronic right heart failure: Secondary | ICD-10-CM | POA: Diagnosis not present

## 2017-08-22 DIAGNOSIS — K922 Gastrointestinal hemorrhage, unspecified: Secondary | ICD-10-CM | POA: Diagnosis not present

## 2017-08-22 DIAGNOSIS — N183 Chronic kidney disease, stage 3 (moderate): Secondary | ICD-10-CM | POA: Diagnosis not present

## 2017-08-22 DIAGNOSIS — I130 Hypertensive heart and chronic kidney disease with heart failure and stage 1 through stage 4 chronic kidney disease, or unspecified chronic kidney disease: Secondary | ICD-10-CM | POA: Diagnosis not present

## 2017-08-22 DIAGNOSIS — I2510 Atherosclerotic heart disease of native coronary artery without angina pectoris: Secondary | ICD-10-CM | POA: Diagnosis not present

## 2017-08-22 DIAGNOSIS — I5043 Acute on chronic combined systolic (congestive) and diastolic (congestive) heart failure: Secondary | ICD-10-CM | POA: Diagnosis not present

## 2017-08-27 DIAGNOSIS — I2510 Atherosclerotic heart disease of native coronary artery without angina pectoris: Secondary | ICD-10-CM | POA: Diagnosis not present

## 2017-08-27 DIAGNOSIS — N183 Chronic kidney disease, stage 3 (moderate): Secondary | ICD-10-CM | POA: Diagnosis not present

## 2017-08-27 DIAGNOSIS — I50813 Acute on chronic right heart failure: Secondary | ICD-10-CM | POA: Diagnosis not present

## 2017-08-27 DIAGNOSIS — I130 Hypertensive heart and chronic kidney disease with heart failure and stage 1 through stage 4 chronic kidney disease, or unspecified chronic kidney disease: Secondary | ICD-10-CM | POA: Diagnosis not present

## 2017-08-27 DIAGNOSIS — K922 Gastrointestinal hemorrhage, unspecified: Secondary | ICD-10-CM | POA: Diagnosis not present

## 2017-08-27 DIAGNOSIS — I5043 Acute on chronic combined systolic (congestive) and diastolic (congestive) heart failure: Secondary | ICD-10-CM | POA: Diagnosis not present

## 2017-08-28 ENCOUNTER — Other Ambulatory Visit (INDEPENDENT_AMBULATORY_CARE_PROVIDER_SITE_OTHER): Payer: Self-pay | Admitting: Orthopedic Surgery

## 2017-08-28 ENCOUNTER — Other Ambulatory Visit: Payer: Self-pay

## 2017-08-28 ENCOUNTER — Encounter (HOSPITAL_COMMUNITY): Payer: Self-pay | Admitting: *Deleted

## 2017-08-28 DIAGNOSIS — M86271 Subacute osteomyelitis, right ankle and foot: Secondary | ICD-10-CM

## 2017-08-28 NOTE — Progress Notes (Signed)
   08/28/17 1609  OBSTRUCTIVE SLEEP APNEA  Have you ever been diagnosed with sleep apnea through a sleep study? No  Do you snore loudly (loud enough to be heard through closed doors)?  1  Do you often feel tired, fatigued, or sleepy during the daytime (such as falling asleep during driving or talking to someone)? 1  Has anyone observed you stop breathing during your sleep? 1  Do you have, or are you being treated for high blood pressure? 1  BMI more than 35 kg/m2? 0  Age > 11 (1-yes) 1  Male Gender (Yes=1) 1  Obstructive Sleep Apnea Score 6

## 2017-08-29 ENCOUNTER — Encounter (HOSPITAL_COMMUNITY): Payer: Self-pay

## 2017-08-29 ENCOUNTER — Inpatient Hospital Stay (HOSPITAL_COMMUNITY)
Admission: RE | Admit: 2017-08-29 | Discharge: 2017-09-03 | DRG: 475 | Disposition: A | Discharge status: Skilled Nursing Facility | Payer: Medicare Other | Attending: Orthopedic Surgery | Admitting: Orthopedic Surgery

## 2017-08-29 ENCOUNTER — Ambulatory Visit (HOSPITAL_COMMUNITY): Payer: Medicare Other | Admitting: Anesthesiology

## 2017-08-29 ENCOUNTER — Encounter (HOSPITAL_COMMUNITY)
Admission: RE | Disposition: A | Discharge status: Skilled Nursing Facility | Payer: Self-pay | Source: Home / Self Care | Attending: Orthopedic Surgery

## 2017-08-29 DIAGNOSIS — I2510 Atherosclerotic heart disease of native coronary artery without angina pectoris: Secondary | ICD-10-CM | POA: Diagnosis not present

## 2017-08-29 DIAGNOSIS — M869 Osteomyelitis, unspecified: Secondary | ICD-10-CM | POA: Diagnosis not present

## 2017-08-29 DIAGNOSIS — Z955 Presence of coronary angioplasty implant and graft: Secondary | ICD-10-CM

## 2017-08-29 DIAGNOSIS — L97509 Non-pressure chronic ulcer of other part of unspecified foot with unspecified severity: Secondary | ICD-10-CM | POA: Diagnosis present

## 2017-08-29 DIAGNOSIS — J449 Chronic obstructive pulmonary disease, unspecified: Secondary | ICD-10-CM | POA: Diagnosis not present

## 2017-08-29 DIAGNOSIS — L97519 Non-pressure chronic ulcer of other part of right foot with unspecified severity: Secondary | ICD-10-CM | POA: Diagnosis not present

## 2017-08-29 DIAGNOSIS — E1151 Type 2 diabetes mellitus with diabetic peripheral angiopathy without gangrene: Secondary | ICD-10-CM | POA: Diagnosis not present

## 2017-08-29 DIAGNOSIS — E1122 Type 2 diabetes mellitus with diabetic chronic kidney disease: Secondary | ICD-10-CM | POA: Diagnosis not present

## 2017-08-29 DIAGNOSIS — Z89422 Acquired absence of other left toe(s): Secondary | ICD-10-CM

## 2017-08-29 DIAGNOSIS — E11621 Type 2 diabetes mellitus with foot ulcer: Secondary | ICD-10-CM | POA: Diagnosis not present

## 2017-08-29 DIAGNOSIS — M86171 Other acute osteomyelitis, right ankle and foot: Principal | ICD-10-CM | POA: Diagnosis present

## 2017-08-29 DIAGNOSIS — E11610 Type 2 diabetes mellitus with diabetic neuropathic arthropathy: Secondary | ICD-10-CM | POA: Diagnosis not present

## 2017-08-29 DIAGNOSIS — M86271 Subacute osteomyelitis, right ankle and foot: Secondary | ICD-10-CM

## 2017-08-29 DIAGNOSIS — I130 Hypertensive heart and chronic kidney disease with heart failure and stage 1 through stage 4 chronic kidney disease, or unspecified chronic kidney disease: Secondary | ICD-10-CM | POA: Diagnosis not present

## 2017-08-29 DIAGNOSIS — Z9841 Cataract extraction status, right eye: Secondary | ICD-10-CM

## 2017-08-29 DIAGNOSIS — G629 Polyneuropathy, unspecified: Secondary | ICD-10-CM | POA: Diagnosis not present

## 2017-08-29 DIAGNOSIS — E785 Hyperlipidemia, unspecified: Secondary | ICD-10-CM | POA: Diagnosis not present

## 2017-08-29 DIAGNOSIS — I482 Chronic atrial fibrillation: Secondary | ICD-10-CM | POA: Diagnosis present

## 2017-08-29 DIAGNOSIS — I5042 Chronic combined systolic (congestive) and diastolic (congestive) heart failure: Secondary | ICD-10-CM | POA: Diagnosis not present

## 2017-08-29 DIAGNOSIS — Z89431 Acquired absence of right foot: Secondary | ICD-10-CM

## 2017-08-29 DIAGNOSIS — Z87891 Personal history of nicotine dependence: Secondary | ICD-10-CM

## 2017-08-29 DIAGNOSIS — E1169 Type 2 diabetes mellitus with other specified complication: Secondary | ICD-10-CM | POA: Diagnosis not present

## 2017-08-29 DIAGNOSIS — Z961 Presence of intraocular lens: Secondary | ICD-10-CM | POA: Diagnosis present

## 2017-08-29 DIAGNOSIS — Z9842 Cataract extraction status, left eye: Secondary | ICD-10-CM

## 2017-08-29 DIAGNOSIS — Z89411 Acquired absence of right great toe: Secondary | ICD-10-CM

## 2017-08-29 DIAGNOSIS — I252 Old myocardial infarction: Secondary | ICD-10-CM

## 2017-08-29 DIAGNOSIS — Z888 Allergy status to other drugs, medicaments and biological substances status: Secondary | ICD-10-CM

## 2017-08-29 HISTORY — DX: Melena: K92.1

## 2017-08-29 HISTORY — PX: AMPUTATION: SHX166

## 2017-08-29 LAB — CBC
HEMATOCRIT: 31.3 % — AB (ref 39.0–52.0)
Hemoglobin: 9.4 g/dL — ABNORMAL LOW (ref 13.0–17.0)
MCH: 28 pg (ref 26.0–34.0)
MCHC: 30 g/dL (ref 30.0–36.0)
MCV: 93.2 fL (ref 78.0–100.0)
Platelets: 162 10*3/uL (ref 150–400)
RBC: 3.36 MIL/uL — ABNORMAL LOW (ref 4.22–5.81)
RDW: 20.3 % — AB (ref 11.5–15.5)
WBC: 6 10*3/uL (ref 4.0–10.5)

## 2017-08-29 LAB — SURGICAL PCR SCREEN
MRSA, PCR: NEGATIVE
STAPHYLOCOCCUS AUREUS: NEGATIVE

## 2017-08-29 LAB — BASIC METABOLIC PANEL
ANION GAP: 12 (ref 5–15)
BUN: 43 mg/dL — ABNORMAL HIGH (ref 8–23)
CALCIUM: 9 mg/dL (ref 8.9–10.3)
CO2: 24 mmol/L (ref 22–32)
CREATININE: 1.82 mg/dL — AB (ref 0.61–1.24)
Chloride: 98 mmol/L (ref 98–111)
GFR calc non Af Amer: 34 mL/min — ABNORMAL LOW (ref >60)
GFR, EST AFRICAN AMERICAN: 40 mL/min — AB (ref >60)
Glucose, Bld: 94 mg/dL (ref 70–99)
Potassium: 4.3 mmol/L (ref 3.5–5.1)
SODIUM: 134 mmol/L — AB (ref 135–145)

## 2017-08-29 LAB — PROTIME-INR
INR: 1.3
Prothrombin Time: 16.1 seconds — ABNORMAL HIGH (ref 11.4–15.2)

## 2017-08-29 SURGERY — AMPUTATION, FOOT, RAY
Anesthesia: General | Laterality: Right

## 2017-08-29 MED ORDER — MEPERIDINE HCL 50 MG/ML IJ SOLN: 6.2500 mg | INTRAMUSCULAR | Status: DC | PRN

## 2017-08-29 MED ORDER — METOCLOPRAMIDE HCL 5 MG/ML IJ SOLN: 5.0000 mg | Freq: Three times a day (TID) | INTRAMUSCULAR | Status: DC | PRN

## 2017-08-29 MED ORDER — FENTANYL CITRATE (PF) 100 MCG/2ML IJ SOLN: 25.0000 ug | INTRAMUSCULAR | Status: DC | PRN

## 2017-08-29 MED ORDER — ONDANSETRON HCL 4 MG/2ML IJ SOLN: 4.0000 mg | Freq: Four times a day (QID) | INTRAMUSCULAR | Status: DC | PRN

## 2017-08-29 MED ORDER — LIDOCAINE HCL (CARDIAC) PF 100 MG/5ML IV SOSY
PREFILLED_SYRINGE | INTRAVENOUS | Status: DC | PRN
  Administered 2017-08-29: 100 mg via INTRAVENOUS

## 2017-08-29 MED ORDER — OXYCODONE HCL 5 MG PO TABS
10.0000 mg | ORAL_TABLET | ORAL | Status: DC | PRN
  Administered 2017-08-30 (×2): 15 mg via ORAL
  Administered 2017-08-31 (×2): 10 mg via ORAL
  Administered 2017-09-01: 15 mg via ORAL
  Administered 2017-09-01: 10 mg via ORAL
  Filled 2017-08-29 (×3): qty 3

## 2017-08-29 MED ORDER — OXYCODONE HCL 5 MG PO TABS
5.0000 mg | ORAL_TABLET | ORAL | Status: DC | PRN
  Administered 2017-08-29 – 2017-09-02 (×3): 10 mg via ORAL
  Filled 2017-08-29 (×6): qty 2

## 2017-08-29 MED ORDER — FENTANYL CITRATE (PF) 250 MCG/5ML IJ SOLN
INTRAMUSCULAR | Status: DC | PRN
  Administered 2017-08-29: 50 ug via INTRAVENOUS

## 2017-08-29 MED ORDER — METHOCARBAMOL 500 MG PO TABS
500.0000 mg | ORAL_TABLET | Freq: Four times a day (QID) | ORAL | Status: DC | PRN
  Administered 2017-08-29 – 2017-09-02 (×6): 500 mg via ORAL
  Filled 2017-08-29 (×6): qty 1

## 2017-08-29 MED ORDER — ACETAMINOPHEN 325 MG PO TABS
325.0000 mg | ORAL_TABLET | Freq: Four times a day (QID) | ORAL | Status: DC | PRN
  Administered 2017-08-29 – 2017-09-03 (×2): 650 mg via ORAL
  Filled 2017-08-29 (×2): qty 2

## 2017-08-29 MED ORDER — 0.9 % SODIUM CHLORIDE (POUR BTL) OPTIME
TOPICAL | Status: DC | PRN
  Administered 2017-08-29: 1000 mL

## 2017-08-29 MED ORDER — PROPOFOL 10 MG/ML IV BOLUS
INTRAVENOUS | Status: DC | PRN
  Administered 2017-08-29: 170 mg via INTRAVENOUS

## 2017-08-29 MED ORDER — BISACODYL 10 MG RE SUPP: 10.0000 mg | Freq: Every day | RECTAL | Status: DC | PRN

## 2017-08-29 MED ORDER — MIDAZOLAM HCL 2 MG/2ML IJ SOLN
INTRAMUSCULAR | Status: AC
  Filled 2017-08-29: qty 2

## 2017-08-29 MED ORDER — FLUTICASONE-UMECLIDIN-VILANT 100-62.5-25 MCG/INH IN AEPB: 1.0000 | INHALATION_SPRAY | Freq: Every day | RESPIRATORY_TRACT | Status: DC

## 2017-08-29 MED ORDER — CEFAZOLIN SODIUM-DEXTROSE 2-4 GM/100ML-% IV SOLN
2.0000 g | INTRAVENOUS | Status: AC
  Administered 2017-08-29: 2 g via INTRAVENOUS

## 2017-08-29 MED ORDER — HYDROMORPHONE HCL 1 MG/ML IJ SOLN
0.5000 mg | INTRAMUSCULAR | Status: DC | PRN
  Administered 2017-08-30: 1 mg via INTRAVENOUS
  Filled 2017-08-29: qty 1

## 2017-08-29 MED ORDER — METOCLOPRAMIDE HCL 5 MG/ML IJ SOLN: 10.0000 mg | Freq: Once | INTRAMUSCULAR | Status: DC | PRN

## 2017-08-29 MED ORDER — ASPIRIN EC 325 MG PO TBEC
325.0000 mg | DELAYED_RELEASE_TABLET | Freq: Every day | ORAL | Status: DC
  Administered 2017-08-29 – 2017-09-03 (×6): 325 mg via ORAL
  Filled 2017-08-29 (×6): qty 1

## 2017-08-29 MED ORDER — DOCUSATE SODIUM 100 MG PO CAPS
100.0000 mg | ORAL_CAPSULE | Freq: Two times a day (BID) | ORAL | Status: DC
  Administered 2017-08-29 – 2017-09-03 (×10): 100 mg via ORAL
  Filled 2017-08-29 (×10): qty 1

## 2017-08-29 MED ORDER — CEFAZOLIN SODIUM-DEXTROSE 2-4 GM/100ML-% IV SOLN
2.0000 g | Freq: Four times a day (QID) | INTRAVENOUS | Status: AC
  Administered 2017-08-29 – 2017-08-30 (×3): 2 g via INTRAVENOUS
  Filled 2017-08-29 (×3): qty 100

## 2017-08-29 MED ORDER — HYDRALAZINE HCL 25 MG PO TABS
12.5000 mg | ORAL_TABLET | Freq: Three times a day (TID) | ORAL | Status: DC
  Administered 2017-08-29 – 2017-09-03 (×13): 12.5 mg via ORAL
  Filled 2017-08-29 (×14): qty 1

## 2017-08-29 MED ORDER — MOMETASONE FURO-FORMOTEROL FUM 100-5 MCG/ACT IN AERO: 2.0000 | INHALATION_SPRAY | Freq: Two times a day (BID) | RESPIRATORY_TRACT | Status: DC

## 2017-08-29 MED ORDER — FLUTICASONE FUROATE-VILANTEROL 100-25 MCG/INH IN AEPB
1.0000 | INHALATION_SPRAY | Freq: Every day | RESPIRATORY_TRACT | Status: DC
  Administered 2017-08-30 – 2017-09-03 (×5): 1 via RESPIRATORY_TRACT
  Filled 2017-08-29: qty 28

## 2017-08-29 MED ORDER — SODIUM CHLORIDE 0.9 % IV SOLN: INTRAVENOUS | Status: DC

## 2017-08-29 MED ORDER — METOPROLOL SUCCINATE ER 25 MG PO TB24
12.5000 mg | ORAL_TABLET | Freq: Every day | ORAL | Status: DC
  Administered 2017-08-30 – 2017-09-03 (×5): 12.5 mg via ORAL
  Filled 2017-08-29 (×5): qty 1

## 2017-08-29 MED ORDER — SODIUM CHLORIDE 0.9 % IV SOLN
INTRAVENOUS | Status: DC
  Administered 2017-08-29: 10 mL/h via INTRAVENOUS

## 2017-08-29 MED ORDER — ONDANSETRON HCL 4 MG PO TABS: 4.0000 mg | ORAL_TABLET | Freq: Four times a day (QID) | ORAL | Status: DC | PRN

## 2017-08-29 MED ORDER — EPHEDRINE SULFATE 50 MG/ML IJ SOLN
INTRAMUSCULAR | Status: DC | PRN
  Administered 2017-08-29 (×2): 5 mg via INTRAVENOUS

## 2017-08-29 MED ORDER — ISOSORBIDE MONONITRATE ER 30 MG PO TB24
15.0000 mg | ORAL_TABLET | Freq: Every day | ORAL | Status: DC
  Administered 2017-08-30 – 2017-09-03 (×5): 15 mg via ORAL
  Filled 2017-08-29 (×5): qty 1

## 2017-08-29 MED ORDER — METHOCARBAMOL 1000 MG/10ML IJ SOLN
500.0000 mg | Freq: Four times a day (QID) | INTRAVENOUS | Status: DC | PRN
  Filled 2017-08-29: qty 5

## 2017-08-29 MED ORDER — METOCLOPRAMIDE HCL 5 MG PO TABS: 5.0000 mg | ORAL_TABLET | Freq: Three times a day (TID) | ORAL | Status: DC | PRN

## 2017-08-29 MED ORDER — POTASSIUM CHLORIDE CRYS ER 20 MEQ PO TBCR
60.0000 meq | EXTENDED_RELEASE_TABLET | Freq: Two times a day (BID) | ORAL | Status: DC
  Administered 2017-08-29 – 2017-09-03 (×11): 60 meq via ORAL
  Filled 2017-08-29 (×11): qty 3

## 2017-08-29 MED ORDER — POLYETHYLENE GLYCOL 3350 17 G PO PACK: 17.0000 g | PACK | Freq: Every day | ORAL | Status: DC | PRN

## 2017-08-29 MED ORDER — ALLOPURINOL 100 MG PO TABS
100.0000 mg | ORAL_TABLET | Freq: Every evening | ORAL | Status: DC
  Administered 2017-08-29 – 2017-09-02 (×5): 100 mg via ORAL
  Filled 2017-08-29 (×5): qty 1

## 2017-08-29 MED ORDER — CEFAZOLIN SODIUM-DEXTROSE 2-4 GM/100ML-% IV SOLN
INTRAVENOUS | Status: AC
  Filled 2017-08-29: qty 100

## 2017-08-29 MED ORDER — SODIUM CHLORIDE 0.9 % IV SOLN
INTRAVENOUS | Status: DC
  Administered 2017-08-29: 09:00:00 via INTRAVENOUS

## 2017-08-29 MED ORDER — FENTANYL CITRATE (PF) 250 MCG/5ML IJ SOLN
INTRAMUSCULAR | Status: AC
  Filled 2017-08-29: qty 5

## 2017-08-29 MED ORDER — NITROGLYCERIN 0.4 MG SL SUBL: 0.4000 mg | SUBLINGUAL_TABLET | SUBLINGUAL | Status: DC | PRN

## 2017-08-29 MED ORDER — TORSEMIDE 20 MG PO TABS
60.0000 mg | ORAL_TABLET | Freq: Two times a day (BID) | ORAL | Status: DC
  Administered 2017-08-29 – 2017-09-03 (×10): 60 mg via ORAL
  Filled 2017-08-29 (×10): qty 3

## 2017-08-29 MED ORDER — SPIRONOLACTONE 25 MG PO TABS
25.0000 mg | ORAL_TABLET | Freq: Every day | ORAL | Status: DC
  Administered 2017-08-29 – 2017-09-03 (×6): 25 mg via ORAL
  Filled 2017-08-29 (×6): qty 1

## 2017-08-29 MED ORDER — ONDANSETRON HCL 4 MG/2ML IJ SOLN
INTRAMUSCULAR | Status: DC | PRN
  Administered 2017-08-29: 4 mg via INTRAVENOUS

## 2017-08-29 MED ORDER — MAGNESIUM CITRATE PO SOLN
1.0000 | Freq: Once | ORAL | Status: AC | PRN
  Administered 2017-08-30: 1 via ORAL
  Filled 2017-08-29: qty 296

## 2017-08-29 MED ORDER — PROPOFOL 10 MG/ML IV BOLUS
INTRAVENOUS | Status: AC
  Filled 2017-08-29: qty 40

## 2017-08-29 MED ORDER — METOLAZONE 2.5 MG PO TABS
2.5000 mg | ORAL_TABLET | ORAL | Status: DC
  Administered 2017-09-03: 2.5 mg via ORAL
  Filled 2017-08-29: qty 1

## 2017-08-29 MED ORDER — CHLORHEXIDINE GLUCONATE 4 % EX LIQD: 60.0000 mL | Freq: Once | CUTANEOUS | Status: DC

## 2017-08-29 SURGICAL SUPPLY — 29 items
BLADE SAW SGTL MED 73X18.5 STR (BLADE) IMPLANT
BLADE SURG 21 STRL SS (BLADE) ×3 IMPLANT
BNDG COHESIVE 4X5 TAN STRL (GAUZE/BANDAGES/DRESSINGS) IMPLANT
BNDG GAUZE ELAST 4 BULKY (GAUZE/BANDAGES/DRESSINGS) IMPLANT
COVER SURGICAL LIGHT HANDLE (MISCELLANEOUS) ×3 IMPLANT
DRAPE U-SHAPE 47X51 STRL (DRAPES) ×3 IMPLANT
DRSG ADAPTIC 3X8 NADH LF (GAUZE/BANDAGES/DRESSINGS) IMPLANT
DRSG PAD ABDOMINAL 8X10 ST (GAUZE/BANDAGES/DRESSINGS) ×6 IMPLANT
DURAPREP 26ML APPLICATOR (WOUND CARE) ×3 IMPLANT
ELECT REM PT RETURN 9FT ADLT (ELECTROSURGICAL) ×3
ELECTRODE REM PT RTRN 9FT ADLT (ELECTROSURGICAL) ×1 IMPLANT
GAUZE SPONGE 4X4 12PLY STRL (GAUZE/BANDAGES/DRESSINGS) IMPLANT
GLOVE BIOGEL PI IND STRL 9 (GLOVE) ×1 IMPLANT
GLOVE BIOGEL PI INDICATOR 9 (GLOVE) ×2
GLOVE SURG ORTHO 9.0 STRL STRW (GLOVE) ×3 IMPLANT
GOWN STRL REUS W/ TWL XL LVL3 (GOWN DISPOSABLE) ×2 IMPLANT
GOWN STRL REUS W/TWL XL LVL3 (GOWN DISPOSABLE) ×4
KIT BASIN OR (CUSTOM PROCEDURE TRAY) ×3 IMPLANT
KIT PREVENA INCISION MGT 13 (CANNISTER) ×3 IMPLANT
KIT TURNOVER KIT B (KITS) ×3 IMPLANT
NS IRRIG 1000ML POUR BTL (IV SOLUTION) ×3 IMPLANT
PACK ORTHO EXTREMITY (CUSTOM PROCEDURE TRAY) ×3 IMPLANT
PAD ARMBOARD 7.5X6 YLW CONV (MISCELLANEOUS) ×6 IMPLANT
STOCKINETTE IMPERVIOUS LG (DRAPES) IMPLANT
SUT ETHILON 2 0 PSLX (SUTURE) ×6 IMPLANT
TOWEL OR 17X26 10 PK STRL BLUE (TOWEL DISPOSABLE) ×3 IMPLANT
TUBE CONNECTING 12'X1/4 (SUCTIONS) ×1
TUBE CONNECTING 12X1/4 (SUCTIONS) ×2 IMPLANT
YANKAUER SUCT BULB TIP NO VENT (SUCTIONS) ×3 IMPLANT

## 2017-08-29 NOTE — Op Note (Signed)
08/29/2017  11:55 AM  PATIENT:  Terry Martinez.    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 is a  77 y.o. 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    The risks benefits and alternatives were discussed with the patient preoperatively including but not limited to the risks of infection, bleeding, nerve injury, cardiopulmonary complications, the need for revision surgery, among others, and the patient was willing to proceed.  OPERATIVE IMPLANTS: Praveena wound VAC  @ENCIMAGES @  OPERATIVE FINDINGS: Good petechial bleeding no deep abscess  OPERATIVE PROCEDURE: Patient was brought the operating room and underwent a general anesthetic.  After adequate levels of anesthesia were obtained patient's right lower extremity was prepped using DuraPrep draped into a sterile field a timeout was called.  A fishmouth incision was made just proximal to the plantar ulcer.  This was carried sharply down to bone a transmetatarsal amputation was performed with an oscillating saw.  Electrocautery was used for hemostasis.  The wound was irrigated with normal saline.  The incision was closed using 2-0 nylon.  A Praveena VAC was applied this had a good suction fit patient was extubated taken the PACU in stable condition.   DISCHARGE PLANNING:  Antibiotic duration: Continue IV antibiotics for 24 hours  Weightbearing: Touchdown weightbearing on the right  Pain medication: Opioid pathway ordered  Dressing care/ Wound VAC: Continue wound VAC for 1 week  Ambulatory devices: Walker  Discharge to: Home tomorrow  Follow-up: In the office 1 week post  operative.

## 2017-08-29 NOTE — Anesthesia Procedure Notes (Signed)
Procedure Name: LMA Insertion Date/Time: 08/29/2017 11:07 AM Performed by: Mariea Clonts, CRNA Pre-anesthesia Checklist: Patient identified, Emergency Drugs available, Suction available and Patient being monitored Patient Re-evaluated:Patient Re-evaluated prior to induction Oxygen Delivery Method: Circle System Utilized Preoxygenation: Pre-oxygenation with 100% oxygen Induction Type: IV induction Ventilation: Mask ventilation without difficulty LMA: LMA inserted LMA Size: 5.0 Number of attempts: 1 Airway Equipment and Method: Bite block Placement Confirmation: positive ETCO2 Tube secured with: Tape Dental Injury: Teeth and Oropharynx as per pre-operative assessment

## 2017-08-29 NOTE — Progress Notes (Signed)
Orthopedic Tech Progress Note Patient Details:  Terry Martinez. Dec 08, 1940 [id number]  Ortho Devices Type of Ortho Device: Postop shoe/boot Ortho Device/Splint Location: rle Ortho Device/Splint Interventions: Application   Post Interventions Patient Tolerated: Well Instructions Provided: Care of device   Hildred Priest 08/29/2017, 3:50 PM
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
## 2017-08-29 NOTE — Transfer of Care (Signed)
Immediate Anesthesia Transfer of Care Note  Patient: Terry Martinez.  Procedure(s) Performed: RIGHT TRANSMETATARSAL AMPUTATION (Right )  Patient Location: PACU  Anesthesia Type:General  Level of Consciousness: drowsy  Airway & Oxygen Therapy: Patient Spontanous Breathing and Patient connected to nasal cannula oxygen  Post-op Assessment: Report given to RN  Post vital signs: Reviewed and stable  Last Vitals:  Vitals Value Taken Time  BP    Temp    Pulse 66 08/29/2017 11:45 AM  Resp    SpO2 99 % 08/29/2017 11:45 AM  Vitals shown include unvalidated device data.  Last Pain:  Vitals:   08/29/17 0807  TempSrc: Oral         Complications: No apparent anesthesia complications

## 2017-08-29 NOTE — Patient Outreach (Signed)
Telephone assessment:  Placed call to patient to follow up on progress. Spoke with sister Garald Braver who reports that wound on foot is worse and patient with have an amputation on 08/29/2017 by Dr. Sharol Given.   Sister reports legs and feet remain swollen. Sister reports that patient has accepted the amputation. Sister is concerned if patient will be able to ambulate post surgery and if she will be able to take care of him a home.   PLAN: I suggested sister talk to Dr. Sharol Given post surgery to determine if she would be able to take care of him at home. Suggested sister speak to case manager or social worker while patient is admitted to determine plan of care post op. Sister agreed. Will continue to follow up with patient post surgery.  Tomasa Rand, RN, BSN, CEN [facility] [employer] [phone]

## 2017-08-29 NOTE — Progress Notes (Addendum)
Pt arrived to room 5N13 after surgery. Family at bedside. Received report from Remo Lipps, [place] in PACU. See assessment. Will continue to monitor.

## 2017-08-29 NOTE — Plan of Care (Signed)
  Problem: Pain Managment: Goal: General experience of comfort will improve Outcome: Progressing   Problem: Safety: Goal: Ability to remain free from injury will improve Outcome: Progressing   

## 2017-08-29 NOTE — Anesthesia Preprocedure Evaluation (Addendum)
Anesthesia Evaluation  Patient identified by MRN, date of birth, ID band Patient awake    Reviewed: Allergy & Precautions, NPO status , Patient's Chart, lab work & pertinent test results  Airway Mallampati: III  TM Distance: >3 FB     Dental  (+) Edentulous Upper, Edentulous Lower   Pulmonary COPD, former smoker,    breath sounds clear to auscultation       Cardiovascular hypertension, Pt. on medications + CAD, + Past MI, + Peripheral Vascular Disease and +CHF  + dysrhythmias Atrial Fibrillation + Valvular Problems/Murmurs AI  Rhythm:Irregular Rate:Normal     Neuro/Psych    GI/Hepatic   Endo/Other    Renal/GU Renal InsufficiencyRenal disease     Musculoskeletal   Abdominal (+) + obese,   Peds  Hematology   Anesthesia Other Findings   Reproductive/Obstetrics                            Anesthesia Physical  Anesthesia Plan  ASA: III  Anesthesia Plan: General   Post-op Pain Management:    Induction: Intravenous  PONV Risk Score and Plan: 2 and Ondansetron and Treatment may vary due to age or medical condition  Airway Management Planned: LMA  Additional Equipment:   Intra-op Plan:   Post-operative Plan:   Informed Consent: I have reviewed the patients History and Physical, chart, labs and discussed the procedure including the risks, benefits and alternatives for the proposed anesthesia with the patient or authorized representative who has indicated his/her understanding and acceptance.   Dental advisory given  Plan Discussed with: CRNA and Anesthesiologist  Anesthesia Plan Comments:        Anesthesia Quick Evaluation

## 2017-08-30 ENCOUNTER — Encounter (HOSPITAL_COMMUNITY): Payer: Self-pay | Admitting: Orthopedic Surgery

## 2017-08-30 ENCOUNTER — Telehealth: Payer: Self-pay | Admitting: Internal Medicine

## 2017-08-30 DIAGNOSIS — E11610 Type 2 diabetes mellitus with diabetic neuropathic arthropathy: Secondary | ICD-10-CM | POA: Diagnosis present

## 2017-08-30 DIAGNOSIS — L97519 Non-pressure chronic ulcer of other part of right foot with unspecified severity: Secondary | ICD-10-CM | POA: Diagnosis present

## 2017-08-30 DIAGNOSIS — I5042 Chronic combined systolic (congestive) and diastolic (congestive) heart failure: Secondary | ICD-10-CM | POA: Diagnosis present

## 2017-08-30 DIAGNOSIS — Z961 Presence of intraocular lens: Secondary | ICD-10-CM | POA: Diagnosis present

## 2017-08-30 DIAGNOSIS — E1122 Type 2 diabetes mellitus with diabetic chronic kidney disease: Secondary | ICD-10-CM | POA: Diagnosis present

## 2017-08-30 DIAGNOSIS — G629 Polyneuropathy, unspecified: Secondary | ICD-10-CM | POA: Diagnosis present

## 2017-08-30 DIAGNOSIS — Z888 Allergy status to other drugs, medicaments and biological substances status: Secondary | ICD-10-CM | POA: Diagnosis not present

## 2017-08-30 DIAGNOSIS — R41841 Cognitive communication deficit: Secondary | ICD-10-CM | POA: Diagnosis not present

## 2017-08-30 DIAGNOSIS — I2510 Atherosclerotic heart disease of native coronary artery without angina pectoris: Secondary | ICD-10-CM | POA: Diagnosis present

## 2017-08-30 DIAGNOSIS — M86271 Subacute osteomyelitis, right ankle and foot: Secondary | ICD-10-CM | POA: Diagnosis not present

## 2017-08-30 DIAGNOSIS — E11621 Type 2 diabetes mellitus with foot ulcer: Secondary | ICD-10-CM | POA: Diagnosis present

## 2017-08-30 DIAGNOSIS — Z89411 Acquired absence of right great toe: Secondary | ICD-10-CM | POA: Diagnosis not present

## 2017-08-30 DIAGNOSIS — E1169 Type 2 diabetes mellitus with other specified complication: Secondary | ICD-10-CM | POA: Diagnosis present

## 2017-08-30 DIAGNOSIS — L97509 Non-pressure chronic ulcer of other part of unspecified foot with unspecified severity: Secondary | ICD-10-CM | POA: Diagnosis present

## 2017-08-30 DIAGNOSIS — M86171 Other acute osteomyelitis, right ankle and foot: Secondary | ICD-10-CM | POA: Diagnosis present

## 2017-08-30 DIAGNOSIS — Z955 Presence of coronary angioplasty implant and graft: Secondary | ICD-10-CM | POA: Diagnosis not present

## 2017-08-30 DIAGNOSIS — Z9841 Cataract extraction status, right eye: Secondary | ICD-10-CM | POA: Diagnosis not present

## 2017-08-30 DIAGNOSIS — I252 Old myocardial infarction: Secondary | ICD-10-CM | POA: Diagnosis not present

## 2017-08-30 DIAGNOSIS — Z87891 Personal history of nicotine dependence: Secondary | ICD-10-CM | POA: Diagnosis not present

## 2017-08-30 DIAGNOSIS — R2689 Other abnormalities of gait and mobility: Secondary | ICD-10-CM | POA: Diagnosis not present

## 2017-08-30 DIAGNOSIS — I10 Essential (primary) hypertension: Secondary | ICD-10-CM | POA: Diagnosis not present

## 2017-08-30 DIAGNOSIS — I482 Chronic atrial fibrillation: Secondary | ICD-10-CM | POA: Diagnosis present

## 2017-08-30 DIAGNOSIS — Z89422 Acquired absence of other left toe(s): Secondary | ICD-10-CM | POA: Diagnosis not present

## 2017-08-30 DIAGNOSIS — J449 Chronic obstructive pulmonary disease, unspecified: Secondary | ICD-10-CM | POA: Diagnosis present

## 2017-08-30 DIAGNOSIS — M6281 Muscle weakness (generalized): Secondary | ICD-10-CM | POA: Diagnosis not present

## 2017-08-30 DIAGNOSIS — E1151 Type 2 diabetes mellitus with diabetic peripheral angiopathy without gangrene: Secondary | ICD-10-CM | POA: Diagnosis present

## 2017-08-30 DIAGNOSIS — Z7401 Bed confinement status: Secondary | ICD-10-CM | POA: Diagnosis not present

## 2017-08-30 DIAGNOSIS — M2550 Pain in unspecified joint: Secondary | ICD-10-CM | POA: Diagnosis not present

## 2017-08-30 DIAGNOSIS — Z9842 Cataract extraction status, left eye: Secondary | ICD-10-CM | POA: Diagnosis not present

## 2017-08-30 DIAGNOSIS — Z89431 Acquired absence of right foot: Secondary | ICD-10-CM | POA: Diagnosis not present

## 2017-08-30 DIAGNOSIS — R488 Other symbolic dysfunctions: Secondary | ICD-10-CM | POA: Diagnosis not present

## 2017-08-30 DIAGNOSIS — I130 Hypertensive heart and chronic kidney disease with heart failure and stage 1 through stage 4 chronic kidney disease, or unspecified chronic kidney disease: Secondary | ICD-10-CM | POA: Diagnosis present

## 2017-08-30 NOTE — Progress Notes (Signed)
Patient is concerned about not being on the routine dose of lasix 60mg  in am and 60 mg in pm. Patient reports having muscle cramps and was informed that he is taking his potassium as prescribed.

## 2017-08-30 NOTE — Telephone Encounter (Signed)
New Message    Patient is in the hospital. Sister called to reschedule appointment. Appt. Made for 11/22/2017, sister request a call back from nurse because appt time is not acceptable.

## 2017-08-30 NOTE — Evaluation (Signed)
Physical Therapy Evaluation Patient Details Name: Terry Martinez. MRN: [id number] DOB: 1940-11-06 Today's Date: 08/30/2017   History of Present Illness  Pt is a 77 y/o male s/p R transmetatarsal amputation. PMH including but not limited to CHF, CKD, COPD, HTN and PAD.    Clinical Impression  Pt presented supine in bed with HOB elevated, awake and willing to participate in therapy session. Prior to admission, pt reported that he ambulated with use of RW and was independent with ADLs. Pt lives with his sister and brother-in-law who cannot physically assist him due to health reasons. Pt currently requires min guard for bed mobility, min-mod A for sit<>stand and min A x2 for safety with stand-pivot transfers with RW. Pt with difficulty maintaining TDWB R LE throughout despite cueing and demonstration. Pt would continue to benefit from skilled physical therapy services at this time while admitted and after d/c to address the below listed limitations in order to improve overall safety and independence with functional mobility.     Follow Up Recommendations SNF    Equipment Recommendations  None recommended by PT    Recommendations for Other Services       Precautions / Restrictions Precautions Precautions: Fall Precaution Comments: wound VAC R foot Restrictions Weight Bearing Restrictions: Yes RLE Weight Bearing: Touchdown weight bearing Other Position/Activity Restrictions: with post-op shoe      Mobility  Bed Mobility Overal bed mobility: Needs Assistance Bed Mobility: Supine to Sit     Supine to sit: Min guard     General bed mobility comments: increased time and effort, min guard for safety  Transfers Overall transfer level: Needs assistance Equipment used: Rolling walker (2 wheeled) Transfers: Sit to/from [facility] Sit to Stand: Mod assist;Min assist;From elevated surface Stand pivot transfers: Min assist;+2 physical assistance;+2 safety/equipment       General transfer comment: increased time and effort, multiple attempts from standard bed height; pt required bed in significantly elevated height to achieve standing position with RW; PT provided demonstration and cueing prior to attempt for technique  Ambulation/Gait             General Gait Details: pt unable to maintain WB precautions; therefore did not pursue further gait training at this time  Stairs            Wheelchair Mobility    Modified Rankin (Stroke Patients Only)       Balance Overall balance assessment: Needs assistance Sitting-balance support: Feet supported Sitting balance-Leahy Scale: Good     Standing balance support: During functional activity;Bilateral upper extremity supported Standing balance-Leahy Scale: Poor                               Pertinent Vitals/Pain Pain Assessment: No/denies pain    Home Living Family/patient expects to be discharged to:: Skilled nursing facility                 Additional Comments: pt lives with his sister and brother-in-law who are not in the best health and cannot provide physical assistance to pt    Prior Function Level of Independence: Independent with assistive device(s)         Comments: ambulates with RW     Hand Dominance        Extremity/Trunk Assessment   Upper Extremity Assessment Upper Extremity Assessment: Overall WFL for tasks assessed    Lower Extremity Assessment Lower Extremity Assessment: Generalized weakness;RLE deficits/detail RLE Deficits /  Details: wound VAC in place on R foot; pt with difficulty maintaining TDWB throughout with cueing       Communication   Communication: No difficulties  Cognition Arousal/Alertness: Awake/alert Behavior During Therapy: Impulsive Overall Cognitive Status: Impaired/Different from baseline Area of Impairment: Attention;Following commands;Safety/judgement;Problem solving                   Current  Attention Level: Sustained   Following Commands: Follows one step commands with increased time;Follows one step commands consistently;Follows multi-step commands with increased time Safety/Judgement: Decreased awareness of safety;Decreased awareness of deficits   Problem Solving: Requires verbal cues;Requires tactile cues        General Comments      Exercises     Assessment/Plan    PT Assessment Patient needs continued PT services  PT Problem List Decreased strength;Decreased balance;Decreased mobility;Decreased activity tolerance;Decreased coordination;Decreased safety awareness;Decreased knowledge of use of DME;Decreased knowledge of precautions;Pain       PT Treatment Interventions DME instruction;Gait training;Stair training;Functional mobility training;Therapeutic activities;Therapeutic exercise;Balance training;Neuromuscular re-education;Patient/family education    PT Goals (Current goals can be found in the Care Plan section)  Acute Rehab PT Goals Patient Stated Goal: return home PT Goal Formulation: With patient Time For Goal Achievement: 09/13/17 Potential to Achieve Goals: Good    Frequency Min 5X/week   Barriers to discharge        Co-evaluation               AM-PAC PT "6 Clicks" Daily Activity  Outcome Measure Difficulty turning over in bed (including adjusting bedclothes, sheets and blankets)?: A Lot Difficulty moving from lying on back to sitting on the side of the bed? : A Lot Difficulty sitting down on and standing up from a chair with arms (e.g., wheelchair, bedside commode, etc,.)?: Unable Help needed moving to and from a bed to chair (including a wheelchair)?: A Little Help needed walking in hospital room?: A Lot Help needed climbing 3-5 steps with a railing? : Total 6 Click Score: 11    End of Session Equipment Utilized During Treatment: Gait belt Activity Tolerance: Patient limited by fatigue Patient left: in chair;with call bell/phone  within reach;with family/visitor present Nurse Communication: Mobility status PT Visit Diagnosis: Other abnormalities of gait and mobility (R26.89)    Time: 8676-7209 PT Time Calculation (min) (ACUTE ONLY): 25 min   Charges:   PT Evaluation $PT Eval Moderate Complexity: 1 Mod PT Treatments $Therapeutic Activity: 8-22 mins   PT G Codes:        [place], PT, DPT [place] 08/30/2017, 11:55 AM

## 2017-08-30 NOTE — NC FL2 (Signed)
[place] LEVEL OF CARE SCREENING TOOL     IDENTIFICATION  Patient Name: Terry Martinez. Birthdate: 08-04-1940 Sex: male Admission Date (Current Location): 08/29/2017  [facility] and [place] Number:  Herbalist and Address:  [facility] [place] [address], [place], [place] [address]      Provider Number: [id number]  Attending Physician Name and Address:  Newt Minion, MD  Relative Name and Phone Number:  Garald Braver, sister, [phone]    Current Level of Care: Hospital Recommended Level of Care: [place] Prior Approval Number:    Date Approved/Denied:   PASRR Number: [id number]  Discharge Plan: SNF    Current Diagnoses: Patient Active Problem List   Diagnosis Date Noted  . S/P transmetatarsal amputation of foot, right ([place]) 08/29/2017  . Subacute osteomyelitis, right ankle and foot ([place])   . Right heart failure ([place]) 06/18/2017  . Non-ST elevation (NSTEMI) myocardial infarction ([place]) 06/18/2017  . Skin tear of forearm without complication, right, initial encounter 05/19/2017  . COPD exacerbation ([place])   . Chronic renal insufficiency, stage IV (severe) ([place])   . S/P amputation of lesser toe, left ([place]) 03/08/2017  . Chronic combined systolic and diastolic heart failure ([place]) 01/26/2017  . Aortic insufficiency 01/26/2017  . Permanent atrial fibrillation ([place])   . Bilateral foot pain 11/17/2016  . Achilles tendon contracture, bilateral 09/25/2016  . Hx of adenomatous colonic polyps 07/28/2016  . H/O amputation of lesser toe, right ([place]) 06/08/2016  . PVD (peripheral vascular disease) ([place]) 04/17/2016  . Acquired absence of right great toe ([place]) 03/02/2016  . Diabetic Charct's arthropathy ([place]) 01/24/2016  . Venous stasis dermatitis of both lower extremities 01/24/2016  . S/P Left axillobifemoral bypass graft 08/11/2015  . Obesity 10/05/2014  . Right foot ulcer, limited to breakdown of skin ([place])  01/30/2014  . Hx of basal cell carcinoma 08/27/2013  . PAD (peripheral artery disease) ([place]) 12/05/2012  . HTN (hypertension) 12/05/2012  . HLD (hyperlipidemia) 12/05/2012  . CAD (coronary artery disease) 12/05/2012  . Gout 12/05/2012  . Preventative health care 12/05/2012  . Chronic anticoagulation 09/04/2012  . Abnormal prostate specific antigen 05/31/2011    Orientation RESPIRATION BLADDER Height & Weight     Self, Time, Situation, Place  Normal Continent Weight: 260 lb (117.9 kg) Height:  6\' 3"  (190.5 cm)  BEHAVIORAL SYMPTOMS/MOOD NEUROLOGICAL BOWEL NUTRITION STATUS      Continent Diet(See DC Summary)  AMBULATORY STATUS COMMUNICATION OF NEEDS Skin   Extensive Assist Verbally Surgical wounds, Wound Vac                       Personal Care Assistance Level of Assistance  Bathing, Feeding, Dressing Bathing Assistance: Maximum assistance Feeding assistance: Limited assistance Dressing Assistance: Limited assistance     Functional Limitations Info  Sight, Hearing, Speech Sight Info: Adequate Hearing Info: Adequate Speech Info: Adequate    SPECIAL CARE FACTORS FREQUENCY  PT (By licensed PT), OT (By licensed OT)     PT Frequency: 5x week OT Frequency: 5x week            Contractures Contractures Info: Not present    Additional Factors Info  Code Status, Allergies Code Status Info: Full code Allergies Info: ZOCOR SIMVASTATIN      Isolation Precautions Info: n/a     Current Medications (08/30/2017):  This is the current hospital active medication list Current Facility-Administered Medications  Medication Dose Route Frequency Provider Last Rate  Last Dose  . 0.9 %  sodium chloride infusion   Intravenous Continuous Newt Minion, MD 10 mL/hr at 08/30/17 0300    . acetaminophen (TYLENOL) tablet 325-650 mg  325-650 mg Oral Q6H PRN Newt Minion, MD   650 mg at 08/29/17 1551  . allopurinol (ZYLOPRIM) tablet 100 mg  100 mg Oral QPM Newt Minion, MD   100 mg  at 08/29/17 1730  . aspirin EC tablet 325 mg  325 mg Oral Daily Newt Minion, MD   325 mg at 08/30/17 0940  . bisacodyl (DULCOLAX) suppository 10 mg  10 mg Rectal Daily PRN Newt Minion, MD      . docusate sodium (COLACE) capsule 100 mg  100 mg Oral BID Newt Minion, MD   100 mg at 08/30/17 0940  . fluticasone furoate-vilanterol (BREO ELLIPTA) 100-25 MCG/INH 1 puff  1 puff Inhalation Daily Newt Minion, MD   1 puff at 08/30/17 0827  . hydrALAZINE (APRESOLINE) tablet 12.5 mg  12.5 mg Oral Q8H Newt Minion, MD   12.5 mg at 08/30/17 1422  . HYDROmorphone (DILAUDID) injection 0.5-1 mg  0.5-1 mg Intravenous Q4H PRN Newt Minion, MD   1 mg at 08/30/17 0208  . isosorbide mononitrate (IMDUR) 24 hr tablet 15 mg  15 mg Oral Daily Newt Minion, MD   15 mg at 08/30/17 0940  . magnesium citrate solution 1 Bottle  1 Bottle Oral Once PRN Newt Minion, MD      . methocarbamol (ROBAXIN) tablet 500 mg  500 mg Oral Q6H PRN Newt Minion, MD   500 mg at 08/30/17 0520   Or  . methocarbamol (ROBAXIN) 500 mg in dextrose 5 % 50 mL IVPB  500 mg Intravenous Q6H PRN Newt Minion, MD      . metoCLOPramide (REGLAN) tablet 5-10 mg  5-10 mg Oral Q8H PRN Newt Minion, MD       Or  . metoCLOPramide (REGLAN) injection 5-10 mg  5-10 mg Intravenous Q8H PRN Newt Minion, MD      . Derrill Memo ON 09/03/2017] metolazone (ZAROXOLYN) tablet 2.5 mg  2.5 mg Oral Q Mon Duda, Marcus V, MD      . metoprolol succinate (TOPROL-XL) 24 hr tablet 12.5 mg  12.5 mg Oral Daily Newt Minion, MD   12.5 mg at 08/30/17 0940  . nitroGLYCERIN (NITROSTAT) SL tablet 0.4 mg  0.4 mg Sublingual Q5 min PRN Newt Minion, MD      . ondansetron [facility]) tablet 4 mg  4 mg Oral Q6H PRN Newt Minion, MD       Or  . ondansetron [facility]) injection 4 mg  4 mg Intravenous Q6H PRN Newt Minion, MD      . oxyCODONE (Oxy IR/ROXICODONE) immediate release tablet 10-15 mg  10-15 mg Oral Q4H PRN Newt Minion, MD   15 mg at 08/30/17 0520  . oxyCODONE  (Oxy IR/ROXICODONE) immediate release tablet 5-10 mg  5-10 mg Oral Q4H PRN Newt Minion, MD   10 mg at 08/29/17 2205  . polyethylene glycol (MIRALAX / GLYCOLAX) packet 17 g  17 g Oral Daily PRN Newt Minion, MD      . potassium chloride SA (K-DUR,KLOR-CON) CR tablet 60 mEq  60 mEq Oral BID Newt Minion, MD   60 mEq at 08/30/17 0941  . spironolactone (ALDACTONE) tablet 25 mg  25 mg Oral Daily Newt Minion, MD  25 mg at 08/30/17 0941  . torsemide (DEMADEX) tablet 60 mg  60 mg Oral BID Newt Minion, MD   60 mg at 08/30/17 0941     Discharge Medications: Please see discharge summary for a list of discharge medications.  Relevant Imaging Results:  Relevant Lab Results:   Additional Information SS#: [id number]  Normajean Baxter, LCSW

## 2017-08-30 NOTE — Care Management (Signed)
Case manager spoke with aptient concerning discharge plan. Also spoke with his sister, [name]. Patient will need shortterm rehab at [facility]. Case manager has spoken with Education officer, museum.

## 2017-08-30 NOTE — Anesthesia Postprocedure Evaluation (Signed)
Anesthesia Post Note  Patient: Terry Martinez.  Procedure(s) Performed: RIGHT TRANSMETATARSAL AMPUTATION (Right )     Patient location during evaluation: PACU Anesthesia Type: General Level of consciousness: awake and alert Pain management: pain level controlled Vital Signs Assessment: post-procedure vital signs reviewed and stable Respiratory status: spontaneous breathing, nonlabored ventilation, respiratory function stable and patient connected to nasal cannula oxygen Cardiovascular status: blood pressure returned to baseline and stable Postop Assessment: no apparent nausea or vomiting Anesthetic complications: no    Last Vitals:  Vitals:   08/30/17 0827 08/30/17 0940  BP:  122/77  Pulse:  60  Resp:    Temp:    SpO2: 95%     Last Pain:  Vitals:   08/30/17 1040  TempSrc:   PainSc: 3                  Montez Hageman

## 2017-08-30 NOTE — Clinical Social Work Note (Signed)
Clinical Social Work Assessment  Patient Details  Name: Terry Martinez. MRN: [id number] Date of Birth: Oct 25, 1940  Date of referral:  08/30/17               Reason for consult:  Facility Placement                Permission sought to share information with:  Chartered certified accountant granted to share information::  Yes, Verbal Permission Granted  Name::     Visual merchandiser::  SNF  Relationship::  sister  Contact Information:     Housing/Transportation Living arrangements for the past 2 months:  [place] of Information:  Patient, Siblings Patient Interpreter Needed:  None Criminal Activity/Legal Involvement Pertinent to Current Situation/Hospitalization:  No - Comment as needed Significant Relationships:  Siblings, Other Family Members Lives with:  Siblings Do you feel safe going back to the place where you live?  No Need for family participation in patient care:  Yes (Comment)  Care giving concerns:  Pt from home with sister and brother n law and will need SNF at discharge.  Social Worker assessment / plan:  CSW met with sister/patient at bedside to discuss disposition. Pt/familly agreeable with need for SNF as sister indicated that her and her spouse are unable to assist patient given his new impairment. She indicated that patient did have home health services, that are completed at this time.  Family/pt agreeable to SNF and gave permission to send out to referrals in [facility].  CSW will f/u for disposition.  Employment status:  Retired Forensic scientist:  [employer] PT Recommendations:  [place] / Referral to community resources:  [place]  Patient/Family's Response to care:  Contractor of CSW meeting to discuss disposition.  Patient/Family's Understanding of and Emotional Response to Diagnosis, Current Treatment, and Prognosis:  Pt and family has good understanding of  patient diagnosis and understand that patient cannot return home in his present state. Pt agrees that he needs SNF to get stronger. Once he has completed some rehabilitative therapies, he will return home with family.  CSW Will f/u for disposition.  Emotional Assessment Appearance:  Appears stated age Attitude/Demeanor/Rapport:  (Cooperative) Affect (typically observed):  Accepting, Appropriate Orientation:  Oriented to  Time, Oriented to Place, Oriented to Self, Oriented to Situation Alcohol / Substance use:  Not Applicable Psych involvement (Current and /or in the community):  No (Comment)  Discharge Needs  Concerns to be addressed:  Discharge Planning Concerns Readmission within the last 30 days:  No Current discharge risk:  Physical Impairment, Dependent with Mobility Barriers to Discharge:  No Barriers Identified   Normajean Baxter, LCSW 08/30/2017, 5:02 PM

## 2017-08-30 NOTE — Progress Notes (Signed)
Patient ID: Terry Martinez., male   DOB: [birth year], 77 y.o.   MRN: [id number] Examination patient is alert and oriented this morning he has no complaints.  There is no drainage in the wound VAC canister.  Patient's family state they do not feel safe trying to care for the patient at home.  Family states they cannot lift or move the patient and patient states he does not have the strength to move on his own.  Will have physical therapy and social work evaluate patient for discharge to skilled nursing.

## 2017-08-31 ENCOUNTER — Ambulatory Visit: Payer: Medicare Other | Admitting: Internal Medicine

## 2017-08-31 MED ORDER — HYDROCODONE-ACETAMINOPHEN 5-325 MG PO TABS: 1.0000 | ORAL_TABLET | ORAL | 0 refills | Status: DC | PRN

## 2017-08-31 NOTE — Progress Notes (Signed)
Patient ID: Terry Martinez., male   DOB: 12/18/1940, 77 y.o.   MRN: [id number] Patient without complaints this morning.  Less than 10 cc in the wound VAC canister.  Orders and prescriptions written for discharge to skilled nursing, anticipate discharge on Saturday.  Patient will be discharged with the Praveena plus portable wound VAC pump.

## 2017-08-31 NOTE — Progress Notes (Signed)
Spoke with Dr. Sharol Given via phone to inform that pt will not be discharged today due to medicare qualifying stay and will be admitted until Sunday. Dr. Sharol Given will be out of town to fix order.

## 2017-08-31 NOTE — Social Work (Addendum)
CSW met with pt and spoke with sister on speaker phone to discuss SNF offers. Pt/family has accepted SNF bed from [facility]  CSW called SNF and left message to confirm bed offer.  [facility] confirmed bed offer. Pt must meet medicare qualifying stay before transferring to SNF.   Elissa Hefty, LCSW Clinical Social Worker [phone]

## 2017-08-31 NOTE — Discharge Summary (Signed)
Discharge Diagnoses:  Active Problems:   Subacute osteomyelitis, right ankle and foot (HCC)   S/P transmetatarsal amputation of foot, right (HCC)   Surgeries: Procedure(s): RIGHT TRANSMETATARSAL AMPUTATION on 08/29/2017    Consultants:   Discharged Condition: Improved  Hospital Course: Terry Martinez. is an 77 y.o. male who was admitted 08/29/2017 with a chief complaint of osteomyelitis right foot, with a final diagnosis of Osteomyelitis Right 4th Metatarsal Head.  Patient was brought to the operating room on 08/29/2017 and underwent Procedure(s): RIGHT TRANSMETATARSAL AMPUTATION.    Patient was given perioperative antibiotics:  Anti-infectives (From admission, onward)   Start     Dose/Rate Route Frequency Ordered Stop   08/29/17 1700  ceFAZolin (ANCEF) IVPB 2g/100 mL premix     2 g 200 mL/hr over 30 Minutes Intravenous Every 6 hours 08/29/17 1424 08/30/17 0553   08/29/17 0815  ceFAZolin (ANCEF) IVPB 2g/100 mL premix     2 g 200 mL/hr over 30 Minutes Intravenous On call to O.R. 08/29/17 0807 08/29/17 1113   08/29/17 0809  ceFAZolin (ANCEF) 2-4 GM/100ML-% IVPB    Note to Pharmacy:  Leandrew Koyanagi   : cabinet override      08/29/17 0809 08/29/17 1113    .  Patient was given sequential compression devices, early ambulation, and aspirin for DVT prophylaxis.  Recent vital signs:  Patient Vitals for the past 24 hrs:  BP Temp Temp src Pulse Resp SpO2  08/31/17 0407 125/71 97.6 F (36.4 C) Oral (!) 51 17 96 %  08/30/17 2130 (!) 118/56 98.3 F (36.8 C) Oral 60 18 95 %  08/30/17 1433 (!) 126/59 98 F (36.7 C) Oral (!) 55 20 95 %  08/30/17 1422 122/77 - - - - -  08/30/17 0940 122/77 - - 60 - -  08/30/17 0827 - - - - - 95 %  .  Recent laboratory studies: No results found.  Discharge Medications:   Allergies as of 08/31/2017      Reactions   Zocor [simvastatin] Hives, Rash      Medication List    STOP taking these medications   doxycycline 100 MG capsule Commonly known  as:  VIBRAMYCIN     TAKE these medications   acetaminophen 500 MG tablet Commonly known as:  TYLENOL Take 1,000 mg by mouth every 8 (eight) hours as needed for moderate pain or headache.   allopurinol 100 MG tablet Commonly known as:  ZYLOPRIM TAKE ONE TABLET BY MOUTH ONCE DAILY IN THE EVENING What changed:    how much to take  how to take this  when to take this  additional instructions   budesonide-formoterol 80-4.5 MCG/ACT inhaler Commonly known as:  SYMBICORT Inhale 2 puffs into the lungs as needed (COPD). What changed:    when to take this  reasons to take this   Fluticasone-Umeclidin-Vilant 100-62.5-25 MCG/INH Aepb Commonly known as:  TRELEGY ELLIPTA Inhale 1 Dose into the lungs daily. What changed:  how much to take   hydrALAZINE 25 MG tablet Commonly known as:  APRESOLINE Take 0.5 tablets (12.5 mg total) by mouth every 8 (eight) hours.   HYDROcodone-acetaminophen 5-325 MG tablet Commonly known as:  NORCO/VICODIN Take 1 tablet by mouth every 4 (four) hours as needed for moderate pain.   isosorbide mononitrate 30 MG 24 hr tablet Commonly known as:  IMDUR Take 15 mg by mouth daily.   LUBRICATING EYE DROPS OP Place 1 drop into both eyes 4 (four) times daily as needed (dry eyes).  metolazone 2.5 MG tablet Commonly known as:  ZAROXOLYN Take one tablet 2 times per week, 30 minutes before torsemide for swelling What changed:    how much to take  how to take this  when to take this  additional instructions   metoprolol succinate 25 MG 24 hr tablet Commonly known as:  TOPROL-XL Take 0.5 tablets (12.5 mg total) by mouth daily.   metroNIDAZOLE 1 % gel Commonly known as:  METROGEL Apply 1 application topically daily as needed (SKIN IRRITATION/ROSACEA.).   mupirocin cream 2 % Commonly known as:  BACTROBAN Apply 1 application topically 2 (two) times daily. What changed:  when to take this   nitroGLYCERIN 0.4 MG SL tablet Commonly known as:   NITROSTAT Place 1 tablet (0.4 mg total) under the tongue every 5 (five) minutes as needed for chest pain.   polyethylene glycol powder powder Commonly known as:  GLYCOLAX/MIRALAX DISSOLVE 17G (1 CAPFUL) OF POWDER IN 8 OUNCES OF LIQUID AND DRINK 2 TIMES PER DAY AS NEEDED FOR MILD CONSTIPATION What changed:  See the new instructions.   potassium chloride SA 20 MEQ tablet Commonly known as:  K-DUR,KLOR-CON Take 3 tablets (60 mEq total) by mouth 2 (two) times daily.   pravastatin 40 MG tablet Commonly known as:  PRAVACHOL TAKE 1 TABLET BY MOUTH ONCE DAILY AT  6PM   Rivaroxaban 15 MG Tabs tablet Commonly known as:  XARELTO Take 1 tablet (15 mg total) by mouth daily with supper.   spironolactone 25 MG tablet Commonly known as:  ALDACTONE Take 1 tablet (25 mg total) by mouth daily.   torsemide 20 MG tablet Commonly known as:  DEMADEX Take 3 tablets (60 mg total) by mouth 2 (two) times daily.   vitamin C 500 MG tablet Commonly known as:  ASCORBIC ACID Take 500 mg by mouth daily.   VITAMIN D-3 PO Take 1 tablet by mouth every morning.            Discharge Care Instructions  (From admission, onward)        Start     Ordered   08/31/17 0000  Touch down weight bearing    Question Answer Comment  Laterality right   Extremity Lower      08/31/17 0706      Diagnostic Studies: Dg Abd 1 View  Result Date: 08/17/2017 CLINICAL DATA:  Follow-up video capsule EXAM: ABDOMEN - 1 VIEW COMPARISON:  None. FINDINGS: Scattered large and small bowel gas is noted. No abnormal mass or abnormal calcifications are seen. Postsurgical changes in the lower lumbar spine are noted. No radiopaque density is identified to correspond with the given clinical history of video capsule although no far lateral portions of the abdomen and the most inferior aspect of the rectum are not visualized on this exam. IMPRESSION: No definitive video capsule is noted. Electronically Signed   By: Inez Catalina M.D.    On: 08/17/2017 15:34    Patient benefited maximally from their hospital stay and there were no complications.     Disposition: Discharge disposition: 03-Skilled Nursing Facility      Discharge Instructions    Call MD / Call 911   Complete by:  As directed    If you experience chest pain or shortness of breath, CALL 911 and be transported to the hospital emergency room.  If you develope a fever above 101 F, pus (white drainage) or increased drainage or redness at the wound, or calf pain, call your surgeon's office.   Constipation  Prevention   Complete by:  As directed    Drink plenty of fluids.  Prune juice may be helpful.  You may use a stool softener, such as Colace (over the counter) 100 mg twice a day.  Use MiraLax (over the counter) for constipation as needed.   Diet - low sodium heart healthy   Complete by:  As directed    Increase activity slowly as tolerated   Complete by:  As directed    Negative Pressure Wound Therapy - Incisional   Complete by:  As directed    Attach to [place] for discharge to SNF, this should stay in place for 1 week   Touch down weight bearing   Complete by:  As directed    Laterality:  right   Extremity:  Lower     Follow-up Information    Newt Minion, MD In 1 week.   Specialty:  Orthopedic Surgery Contact information: [address] [place] [address] [phone]            Signed: Newt Minion 08/31/2017, 7:07 AM

## 2017-09-03 ENCOUNTER — Other Ambulatory Visit: Payer: Self-pay

## 2017-09-03 DIAGNOSIS — R300 Dysuria: Secondary | ICD-10-CM | POA: Diagnosis not present

## 2017-09-03 DIAGNOSIS — D631 Anemia in chronic kidney disease: Secondary | ICD-10-CM | POA: Diagnosis not present

## 2017-09-03 DIAGNOSIS — R05 Cough: Secondary | ICD-10-CM | POA: Diagnosis not present

## 2017-09-03 DIAGNOSIS — M86271 Subacute osteomyelitis, right ankle and foot: Secondary | ICD-10-CM | POA: Diagnosis not present

## 2017-09-03 DIAGNOSIS — R339 Retention of urine, unspecified: Secondary | ICD-10-CM | POA: Diagnosis not present

## 2017-09-03 DIAGNOSIS — M79671 Pain in right foot: Secondary | ICD-10-CM | POA: Diagnosis not present

## 2017-09-03 DIAGNOSIS — E11610 Type 2 diabetes mellitus with diabetic neuropathic arthropathy: Secondary | ICD-10-CM | POA: Diagnosis not present

## 2017-09-03 DIAGNOSIS — I5042 Chronic combined systolic (congestive) and diastolic (congestive) heart failure: Secondary | ICD-10-CM | POA: Diagnosis not present

## 2017-09-03 DIAGNOSIS — N400 Enlarged prostate without lower urinary tract symptoms: Secondary | ICD-10-CM | POA: Diagnosis not present

## 2017-09-03 DIAGNOSIS — D649 Anemia, unspecified: Secondary | ICD-10-CM | POA: Diagnosis not present

## 2017-09-03 DIAGNOSIS — Z95828 Presence of other vascular implants and grafts: Secondary | ICD-10-CM | POA: Diagnosis not present

## 2017-09-03 DIAGNOSIS — E119 Type 2 diabetes mellitus without complications: Secondary | ICD-10-CM | POA: Diagnosis not present

## 2017-09-03 DIAGNOSIS — M2550 Pain in unspecified joint: Secondary | ICD-10-CM | POA: Diagnosis not present

## 2017-09-03 DIAGNOSIS — R488 Other symbolic dysfunctions: Secondary | ICD-10-CM | POA: Diagnosis not present

## 2017-09-03 DIAGNOSIS — M25512 Pain in left shoulder: Secondary | ICD-10-CM | POA: Diagnosis not present

## 2017-09-03 DIAGNOSIS — K922 Gastrointestinal hemorrhage, unspecified: Secondary | ICD-10-CM | POA: Diagnosis not present

## 2017-09-03 DIAGNOSIS — I351 Nonrheumatic aortic (valve) insufficiency: Secondary | ICD-10-CM | POA: Diagnosis not present

## 2017-09-03 DIAGNOSIS — R197 Diarrhea, unspecified: Secondary | ICD-10-CM | POA: Diagnosis not present

## 2017-09-03 DIAGNOSIS — I482 Chronic atrial fibrillation: Secondary | ICD-10-CM | POA: Diagnosis not present

## 2017-09-03 DIAGNOSIS — Z8719 Personal history of other diseases of the digestive system: Secondary | ICD-10-CM | POA: Diagnosis not present

## 2017-09-03 DIAGNOSIS — I2510 Atherosclerotic heart disease of native coronary artery without angina pectoris: Secondary | ICD-10-CM | POA: Diagnosis not present

## 2017-09-03 DIAGNOSIS — N189 Chronic kidney disease, unspecified: Secondary | ICD-10-CM | POA: Diagnosis not present

## 2017-09-03 DIAGNOSIS — Z7401 Bed confinement status: Secondary | ICD-10-CM | POA: Diagnosis not present

## 2017-09-03 DIAGNOSIS — Z79899 Other long term (current) drug therapy: Secondary | ICD-10-CM | POA: Diagnosis not present

## 2017-09-03 DIAGNOSIS — R2689 Other abnormalities of gait and mobility: Secondary | ICD-10-CM | POA: Diagnosis not present

## 2017-09-03 DIAGNOSIS — K5900 Constipation, unspecified: Secondary | ICD-10-CM | POA: Diagnosis not present

## 2017-09-03 DIAGNOSIS — R52 Pain, unspecified: Secondary | ICD-10-CM | POA: Diagnosis not present

## 2017-09-03 DIAGNOSIS — Z89422 Acquired absence of other left toe(s): Secondary | ICD-10-CM | POA: Diagnosis not present

## 2017-09-03 DIAGNOSIS — I129 Hypertensive chronic kidney disease with stage 1 through stage 4 chronic kidney disease, or unspecified chronic kidney disease: Secondary | ICD-10-CM | POA: Diagnosis not present

## 2017-09-03 DIAGNOSIS — M6281 Muscle weakness (generalized): Secondary | ICD-10-CM | POA: Diagnosis not present

## 2017-09-03 DIAGNOSIS — M869 Osteomyelitis, unspecified: Secondary | ICD-10-CM | POA: Diagnosis not present

## 2017-09-03 DIAGNOSIS — I4891 Unspecified atrial fibrillation: Secondary | ICD-10-CM | POA: Diagnosis not present

## 2017-09-03 DIAGNOSIS — R338 Other retention of urine: Secondary | ICD-10-CM | POA: Diagnosis not present

## 2017-09-03 DIAGNOSIS — N186 End stage renal disease: Secondary | ICD-10-CM | POA: Diagnosis not present

## 2017-09-03 DIAGNOSIS — M79606 Pain in leg, unspecified: Secondary | ICD-10-CM | POA: Diagnosis not present

## 2017-09-03 DIAGNOSIS — I739 Peripheral vascular disease, unspecified: Secondary | ICD-10-CM | POA: Diagnosis not present

## 2017-09-03 DIAGNOSIS — I509 Heart failure, unspecified: Secondary | ICD-10-CM | POA: Diagnosis not present

## 2017-09-03 DIAGNOSIS — N179 Acute kidney failure, unspecified: Secondary | ICD-10-CM | POA: Diagnosis not present

## 2017-09-03 DIAGNOSIS — J449 Chronic obstructive pulmonary disease, unspecified: Secondary | ICD-10-CM | POA: Diagnosis not present

## 2017-09-03 DIAGNOSIS — I10 Essential (primary) hypertension: Secondary | ICD-10-CM | POA: Diagnosis not present

## 2017-09-03 DIAGNOSIS — R41841 Cognitive communication deficit: Secondary | ICD-10-CM | POA: Diagnosis not present

## 2017-09-03 DIAGNOSIS — N184 Chronic kidney disease, stage 4 (severe): Secondary | ICD-10-CM | POA: Diagnosis not present

## 2017-09-03 DIAGNOSIS — Z89431 Acquired absence of right foot: Secondary | ICD-10-CM | POA: Diagnosis not present

## 2017-09-03 DIAGNOSIS — S41112A Laceration without foreign body of left upper arm, initial encounter: Secondary | ICD-10-CM | POA: Diagnosis not present

## 2017-09-03 DIAGNOSIS — Z7901 Long term (current) use of anticoagulants: Secondary | ICD-10-CM | POA: Diagnosis not present

## 2017-09-03 DIAGNOSIS — E782 Mixed hyperlipidemia: Secondary | ICD-10-CM | POA: Diagnosis not present

## 2017-09-03 NOTE — Patient Outreach (Signed)
Care coordination:  Patient discharged to [facility] place for rehab.  PLAN: placed order for [facility] social worker to follow.   Tomasa Rand, RN, BSN, CEN [facility] [employer] [phone]

## 2017-09-03 NOTE — Plan of Care (Signed)
  Problem: Education: Goal: Knowledge of General Education information will improve Outcome: Progressing   Problem: Activity: Goal: Risk for activity intolerance will decrease Outcome: Progressing   Problem: Nutrition: Goal: Adequate nutrition will be maintained Outcome: Progressing   Problem: Pain Managment: Goal: General experience of comfort will improve Outcome: Progressing   Problem: Safety: Goal: Ability to remain free from injury will improve Outcome: Progressing

## 2017-09-03 NOTE — Progress Notes (Signed)
Patient discharge to [facility] place. Report called in to [place] LPN. Patient had no c/o pain or discomfort upon discharge. Wound Vac switched to [place].  Left unit via [facility].

## 2017-09-03 NOTE — Clinical Social Work Placement (Signed)
   CLINICAL SOCIAL WORK PLACEMENT  NOTE  Date:  09/03/2017  Patient Details  Name: Terry Martinez. MRN: [id number] Date of Birth: Jan 12, 1941  Clinical Social Work is seeking post-discharge placement for this patient at the [place] level of care (*CSW will initial, date and re-position this form in  chart as items are completed):      Patient/family provided with [place] Work Department's list of facilities offering this level of care within the geographic area requested by the patient (or if unable, by the patient's family).  Yes   Patient/family informed of their freedom to choose among providers that offer the needed level of care, that participate in Medicare, Medicaid or managed care program needed by the patient, have an available bed and are willing to accept the patient.      Patient/family informed of 's ownership interest in [facility] and [facility], as well as of the fact that they are under no obligation to receive care at these facilities.  PASRR submitted to EDS on       PASRR number received on 08/30/17     Existing PASRR number confirmed on       FL2 transmitted to all facilities in geographic area requested by pt/family on 08/30/17     FL2 transmitted to all facilities within larger geographic area on       Patient informed that his/her managed care company has contracts with or will negotiate with certain facilities, including the following:        Yes   Patient/family informed of bed offers received.  Patient chooses bed at [facility]     Physician recommends and patient chooses bed at      Patient to be transferred to [facility] on 09/03/17.  Patient to be transferred to facility by [facility]     Patient family notified on 09/03/17 of transfer.  Name of family member notified:  [name]     PHYSICIAN       Additional Comment:    _______________________________________________ Eileen Stanford, LCSW 09/03/2017, 10:22 AM

## 2017-09-03 NOTE — Care Management Important Message (Signed)
Important Message  Patient Details  Name: Terry Martinez. MRN: [id number] Date of Birth: 1940-04-29   Medicare Important Message Given:  Yes    Masiya Claassen 09/03/2017, 3:47 PM

## 2017-09-03 NOTE — Clinical Social Work Note (Addendum)
Clinical Social Worker facilitated patient discharge including contacting patient family and facility to confirm patient discharge plans.  Clinical information faxed to facility and family agreeable with plan.  CSW arranged ambulance transport via [facility] (1:00) to [facility]room [facility]P.  RN to call [phone] for report prior to discharge.  Clinical Social Worker will sign off for now as social work intervention is no longer needed. Please consult [place] again if new need arises.  Loletha Grayer, MSW [phone]

## 2017-09-03 NOTE — Progress Notes (Signed)
Physical Therapy Treatment Patient Details Name: Terry Martinez. MRN: [id number] DOB: 1941/01/16 Today's Date: 09/03/2017    History of Present Illness Pt is a 77 y/o male s/p R transmetatarsal amputation. PMH including but not limited to CHF, CKD, COPD, HTN and PAD.    PT Comments    Pt performed supine and seated exercises to promote strength for carryover with functional mobility.  Plan for d/c to SNF today.  This plan remains appropriate as patient is requiring +2 moderate assistance to achieve standing while maintaining TDWB.  Pt slow and guarded and fatigues quickly.     Follow Up Recommendations  SNF     Equipment Recommendations  None recommended by PT    Recommendations for Other Services       Precautions / Restrictions Precautions Precautions: Fall Precaution Comments: wound VAC R foot Restrictions Weight Bearing Restrictions: Yes RLE Weight Bearing: Touchdown weight bearing Other Position/Activity Restrictions: with post-op shoe    Mobility  Bed Mobility               General bed mobility comments: Pt sitting in recliner on arrival.    Transfers Overall transfer level: Needs assistance Equipment used: Rolling walker (2 wheeled) Transfers: Sit to/from Stand Sit to Stand: Mod assist;+2 physical assistance(required increased assistance to maintain TDWB during transition to standing.  )         General transfer comment: Increased time and effort.  Cues for hand placement and R foot placement to maintain weight bearing.  Pt tolerated standing with decreased assistance to maintain.    Ambulation/Gait Ambulation/Gait assistance: (NT, patient unable to shift weight with TDWB to progress to gt training at this time.  )               Stairs             Wheelchair Mobility    Modified Rankin (Stroke Patients Only)       Balance Overall balance assessment: Needs assistance Sitting-balance support: Feet supported Sitting balance-Leahy  Scale: Good     Standing balance support: During functional activity;Bilateral upper extremity supported Standing balance-Leahy Scale: Poor                              Cognition Arousal/Alertness: Awake/alert Behavior During Therapy: Impulsive Overall Cognitive Status: Impaired/Different from baseline Area of Impairment: Attention;Following commands;Safety/judgement;Problem solving                   Current Attention Level: Sustained   Following Commands: Follows one step commands with increased time;Follows one step commands consistently;Follows multi-step commands with increased time Safety/Judgement: Decreased awareness of safety;Decreased awareness of deficits   Problem Solving: Requires verbal cues;Requires tactile cues        Exercises Total Joint Exercises Ankle Circles/Pumps: AROM;Both;20 reps;Supine Quad Sets: AROM;Both;10 reps;Supine Towel Squeeze: AROM;Both;10 reps;Seated Long Arc Quad: AROM;Both;10 reps;Seated General Exercises - Lower Extremity Hip Flexion/Marching: AROM;Both;10 reps;Seated    General Comments        Pertinent Vitals/Pain Pain Assessment: No/denies pain    Home Living                      Prior Function            PT Goals (current goals can now be found in the care plan section) Acute Rehab PT Goals Patient Stated Goal: return home Potential to Achieve Goals: Good Progress towards PT goals: Progressing toward  goals    Frequency    Min 5X/week      PT Plan Current plan remains appropriate    Co-evaluation              AM-PAC PT "6 Clicks" Daily Activity  Outcome Measure  Difficulty turning over in bed (including adjusting bedclothes, sheets and blankets)?: A Lot Difficulty moving from lying on back to sitting on the side of the bed? : A Lot Difficulty sitting down on and standing up from a chair with arms (e.g., wheelchair, bedside commode, etc,.)?: Unable Help needed moving to and  from a bed to chair (including a wheelchair)?: A Little Help needed walking in hospital room?: A Lot Help needed climbing 3-5 steps with a railing? : Total 6 Click Score: 11    End of Session Equipment Utilized During Treatment: Gait belt Activity Tolerance: Patient limited by fatigue Patient left: in chair;with call bell/phone within reach;with family/visitor present;with chair alarm set Nurse Communication: Weight bearing status PT Visit Diagnosis: Other abnormalities of gait and mobility (R26.89)     Time: 1044-1101 PT Time Calculation (min) (ACUTE ONLY): 17 min  Charges:  $Therapeutic Activity: 8-22 mins                    G CodesGovernor Rooks, PTA pager [phone]    Cristela Blue 09/03/2017, 11:08 AM

## 2017-09-04 DIAGNOSIS — I10 Essential (primary) hypertension: Secondary | ICD-10-CM | POA: Diagnosis not present

## 2017-09-04 DIAGNOSIS — Z89431 Acquired absence of right foot: Secondary | ICD-10-CM | POA: Diagnosis not present

## 2017-09-04 DIAGNOSIS — K5900 Constipation, unspecified: Secondary | ICD-10-CM | POA: Diagnosis not present

## 2017-09-04 DIAGNOSIS — M869 Osteomyelitis, unspecified: Secondary | ICD-10-CM | POA: Diagnosis not present

## 2017-09-05 ENCOUNTER — Telehealth (INDEPENDENT_AMBULATORY_CARE_PROVIDER_SITE_OTHER): Payer: Self-pay

## 2017-09-05 DIAGNOSIS — Z89431 Acquired absence of right foot: Secondary | ICD-10-CM | POA: Diagnosis not present

## 2017-09-05 DIAGNOSIS — M79671 Pain in right foot: Secondary | ICD-10-CM | POA: Diagnosis not present

## 2017-09-05 DIAGNOSIS — I4891 Unspecified atrial fibrillation: Secondary | ICD-10-CM | POA: Diagnosis not present

## 2017-09-05 DIAGNOSIS — I10 Essential (primary) hypertension: Secondary | ICD-10-CM | POA: Diagnosis not present

## 2017-09-05 DIAGNOSIS — M25512 Pain in left shoulder: Secondary | ICD-10-CM | POA: Diagnosis not present

## 2017-09-05 DIAGNOSIS — M869 Osteomyelitis, unspecified: Secondary | ICD-10-CM | POA: Diagnosis not present

## 2017-09-05 DIAGNOSIS — R2689 Other abnormalities of gait and mobility: Secondary | ICD-10-CM | POA: Diagnosis not present

## 2017-09-05 NOTE — Telephone Encounter (Signed)
Called and sw dianne to advise that the vac can be d/c on Friday with a dry dressing applied daily and will follow up in the office on Wednesday with erin. To call with questions.

## 2017-09-05 NOTE — Telephone Encounter (Signed)
Pt has wound vac on that is preset for 7 days. Indicating it is on day 5. Wants to know if on day 7 nurse there can remove and take over wound or does pt need to be seen here? Please advise

## 2017-09-06 ENCOUNTER — Ambulatory Visit: Payer: Medicare Other | Admitting: Physician Assistant

## 2017-09-06 DIAGNOSIS — R300 Dysuria: Secondary | ICD-10-CM | POA: Diagnosis not present

## 2017-09-06 DIAGNOSIS — I4891 Unspecified atrial fibrillation: Secondary | ICD-10-CM | POA: Diagnosis not present

## 2017-09-06 DIAGNOSIS — R339 Retention of urine, unspecified: Secondary | ICD-10-CM | POA: Diagnosis not present

## 2017-09-06 DIAGNOSIS — R197 Diarrhea, unspecified: Secondary | ICD-10-CM | POA: Diagnosis not present

## 2017-09-07 ENCOUNTER — Encounter: Payer: Self-pay | Admitting: *Deleted

## 2017-09-07 ENCOUNTER — Telehealth: Payer: Self-pay | Admitting: Gastroenterology

## 2017-09-07 ENCOUNTER — Other Ambulatory Visit: Payer: Self-pay | Admitting: *Deleted

## 2017-09-07 DIAGNOSIS — N189 Chronic kidney disease, unspecified: Secondary | ICD-10-CM | POA: Diagnosis not present

## 2017-09-07 DIAGNOSIS — R197 Diarrhea, unspecified: Secondary | ICD-10-CM | POA: Diagnosis not present

## 2017-09-07 DIAGNOSIS — M79671 Pain in right foot: Secondary | ICD-10-CM | POA: Diagnosis not present

## 2017-09-07 DIAGNOSIS — R2689 Other abnormalities of gait and mobility: Secondary | ICD-10-CM | POA: Diagnosis not present

## 2017-09-07 DIAGNOSIS — D649 Anemia, unspecified: Secondary | ICD-10-CM | POA: Diagnosis not present

## 2017-09-07 DIAGNOSIS — M25512 Pain in left shoulder: Secondary | ICD-10-CM | POA: Diagnosis not present

## 2017-09-07 DIAGNOSIS — R339 Retention of urine, unspecified: Secondary | ICD-10-CM | POA: Diagnosis not present

## 2017-09-07 NOTE — Telephone Encounter (Signed)
I spoke with Aldona Bar and erviewed chart.  Dr. Harrington Challenger ordered the patient to stop xeralto.  Capsule endo was negative.  Patient is insistent that he was not on xeralto when he was admitted for toe amputation.  When he was discharged xeralto was on his medication list. Aldona Bar will clarify with Dr. Harrington Challenger.

## 2017-09-07 NOTE — Telephone Encounter (Signed)
Samantha at [facility] calling regarding pt xarelto that she claims was discontinue by Dr. Fuller Plan. Pt just had surgery and was put on xarelto again but pt refuses to take until Dr. Fuller Plan approves it.

## 2017-09-07 NOTE — Patient Outreach (Signed)
[facility]) Care Management  09/07/2017  Terry Martinez. 1940/11/06 [id number]   CSW was able to make contact with patient today to perform the initial assessment, as well as assess and assist with social work needs and services. CSW met with patient at [facility] [place] where patient currently resides to receive short-term rehabilitative services.  CSW introduced self, explained role and types of services provided through [facility] ([facility]).  CSW further explained to patient that CSW works with patient's RNCM, also with [facility], Tomasa Rand. CSW then explained the reason for the visit, indicating that Mrs. Terry Martinez thought that patient would benefit from social work services and resources to assist with discharge planning needs from the skilled nursing facility where patient currently resides.  CSW obtained two HIPAA compliant identifiers from patient, which included patient's name and date of birth.  Patient is a very pleasant 77 year old gentlemen with recent right foot amputation.  Patient appears to be handling the loss quite well, but CSW still offered counseling and supportive services.  Patient reports that he will get the wound vac off today and is pleased with the progress he is making.  Patient indicated that he has been working well with therapies, both physical and occupational.  Patient is unable to ambulate at present, as he is not weight bearing on his right foot.  Patient stated that he will be returning home to live with his sister and brother-in-law at time of release from [facility].  Patient reported that his pain is minimal, that he simply takes a Tylenol when he begins to feel the slightest bit of discomfort, trying to stay ahead of the pain.  Patient is agreeable to home health services and durable medical equipment being arranged for him at time of discharge.   CSW agreed to follow-up with patient again next week to assess and assist with discharge planning needs and services.  [name], BSW, MSW, LCSW  Licensed Education officer, environmental Health System  Mailing [place] [address], [place], [place] [address] Physical Address-[address], [place], [place] [address] Toll Free Main # [phone] Fax # [phone] Cell # [phone]  Office # [phone] [name]

## 2017-09-10 DIAGNOSIS — D649 Anemia, unspecified: Secondary | ICD-10-CM | POA: Diagnosis not present

## 2017-09-10 DIAGNOSIS — R339 Retention of urine, unspecified: Secondary | ICD-10-CM | POA: Diagnosis not present

## 2017-09-10 DIAGNOSIS — Z89431 Acquired absence of right foot: Secondary | ICD-10-CM | POA: Diagnosis not present

## 2017-09-10 DIAGNOSIS — R197 Diarrhea, unspecified: Secondary | ICD-10-CM | POA: Diagnosis not present

## 2017-09-11 DIAGNOSIS — Z89431 Acquired absence of right foot: Secondary | ICD-10-CM | POA: Diagnosis not present

## 2017-09-11 DIAGNOSIS — M79671 Pain in right foot: Secondary | ICD-10-CM | POA: Diagnosis not present

## 2017-09-11 DIAGNOSIS — R339 Retention of urine, unspecified: Secondary | ICD-10-CM | POA: Diagnosis not present

## 2017-09-11 DIAGNOSIS — R2689 Other abnormalities of gait and mobility: Secondary | ICD-10-CM | POA: Diagnosis not present

## 2017-09-11 DIAGNOSIS — M25512 Pain in left shoulder: Secondary | ICD-10-CM | POA: Diagnosis not present

## 2017-09-12 ENCOUNTER — Encounter (INDEPENDENT_AMBULATORY_CARE_PROVIDER_SITE_OTHER): Payer: Self-pay | Admitting: Family

## 2017-09-12 ENCOUNTER — Ambulatory Visit (INDEPENDENT_AMBULATORY_CARE_PROVIDER_SITE_OTHER): Payer: Medicare Other | Admitting: Family

## 2017-09-12 DIAGNOSIS — Z89431 Acquired absence of right foot: Secondary | ICD-10-CM

## 2017-09-12 MED ORDER — HYDROCODONE-ACETAMINOPHEN 5-325 MG PO TABS: 1.0000 | ORAL_TABLET | Freq: Three times a day (TID) | ORAL | 0 refills | Status: AC | PRN
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
## 2017-09-12 NOTE — Addendum Note (Signed)
Addended by: Dondra Prader R on: 09/12/2017 02:46 PM   Modules accepted: Orders

## 2017-09-14 ENCOUNTER — Other Ambulatory Visit: Payer: Self-pay | Admitting: *Deleted

## 2017-09-14 DIAGNOSIS — R52 Pain, unspecified: Secondary | ICD-10-CM | POA: Diagnosis not present

## 2017-09-14 DIAGNOSIS — N179 Acute kidney failure, unspecified: Secondary | ICD-10-CM | POA: Diagnosis not present

## 2017-09-14 DIAGNOSIS — D649 Anemia, unspecified: Secondary | ICD-10-CM | POA: Diagnosis not present

## 2017-09-14 DIAGNOSIS — I4891 Unspecified atrial fibrillation: Secondary | ICD-10-CM | POA: Diagnosis not present

## 2017-09-14 NOTE — Patient Outreach (Signed)
[facility]) Care Management  09/14/2017  Terry Martinez. 07/10/1940 [id number]   CSW was able to meet with patient at [facility] [place] where patient currently resides to receive short-term rehabilitative services.  Patient was in a great mood today, indicating that he is feeling well and free from pain.  Patient had not yet worked with therapies this morning, but reported that he is working well with both physical and occupational health.  Patient is able to bare weight and is ambulating with assistance.  Patient denied having a tentative discharge date scheduled at this time, but is emphatic about returning home to live with his sister and brother-in-law at time of discharge.  Patient is agreeable to home health services being arranged for him at time of discharge from the skilled nursing facility.  CSW agreed to meet with patient again next week to assess and assist with discharge planning needs and services. [name], BSW, MSW, LCSW  Licensed Education officer, environmental Health System  Mailing [name] [place], [place] [address] Physical Address-[address], [place], [place] [address] Toll Free Main # [phone] Fax # [phone] Cell # [phone]  Office # [phone] [name]

## 2017-09-17 ENCOUNTER — Other Ambulatory Visit: Payer: Self-pay | Admitting: *Deleted

## 2017-09-17 ENCOUNTER — Encounter: Payer: Self-pay | Admitting: *Deleted

## 2017-09-17 DIAGNOSIS — R339 Retention of urine, unspecified: Secondary | ICD-10-CM | POA: Diagnosis not present

## 2017-09-17 DIAGNOSIS — M79671 Pain in right foot: Secondary | ICD-10-CM | POA: Diagnosis not present

## 2017-09-17 DIAGNOSIS — R300 Dysuria: Secondary | ICD-10-CM | POA: Diagnosis not present

## 2017-09-17 DIAGNOSIS — R2689 Other abnormalities of gait and mobility: Secondary | ICD-10-CM | POA: Diagnosis not present

## 2017-09-17 DIAGNOSIS — K5900 Constipation, unspecified: Secondary | ICD-10-CM | POA: Diagnosis not present

## 2017-09-17 DIAGNOSIS — M25512 Pain in left shoulder: Secondary | ICD-10-CM | POA: Diagnosis not present

## 2017-09-17 DIAGNOSIS — M79606 Pain in leg, unspecified: Secondary | ICD-10-CM | POA: Diagnosis not present

## 2017-09-17 NOTE — Patient Outreach (Signed)
[facility]) Care Management  09/17/2017  Brentt Fread. Jun 23, 1940 [id number]  CSW received an In [employer] from Reginia Naas, [facility] with [facility], indicating that patient's sister, Garald Braver is requesting assistance with long-term care placement arrangements for patient.  Mrs. Tousey went on to say that Mrs. Elks was actually at [facility] [place] where patient currently resides to receive short-term rehabilitative services, at the time of her call, just waiting to speak with someone in the admissions department about long-term care placement for her brother.  Mrs. Tousey explained that [facility] would be in contact with her at CSW's earliest convenience.  CSW was able to make contact with Mrs. Elks today to discuss long-term care placement arrangements for patient.  [name] admits that she is no longer able to care for patient in the home, as patient's condition appears to have declined just in the few weeks that he has been at [facility].  [name] indicated that patient is not able to ambulate at all, as he is non-weight bearing on his right foot, due to recent amputation.  [name] further indicated that patient is now wearing diapers and not able to perform activities of daily living independently.  [name] stated that patient also appears to be somewhat confused and disoriented.   [name] reported that she was able to speak with someone in the admissions department at [facility] and that they reported currently having a male long-term care bed available if Mrs. Elks is interested.  The admissions department agreed to contact the [facility] to have patient's Adult Medicaid switched over to [facility].  [name] is hopeful that patient will be able to move into the long-term care bed by Friday, September 21, 2017, as she would like for patient to get acclimated to his new  environment as soon as possible.  [name] admits that patient is not at all happy with having to reside at [facility] long-term, but that he is accepting of the idea.  CSW agreed to follow-up with Mrs. Elks and patient again on Friday, September 21, 2017 to ensure that patient will be able to remain at [facility] for long-term care services.  [name] has CSW's contact information and has been encouraged to contact CSW directly if anything changes or if additional social work needs arise in the meantime.  [name] agreed to contact CSW if long-term care placement needs to be pursued for patient in another skilled nursing facility or if patient is not approved for Long-Term Care Medicaid.  [name], BSW, MSW, LCSW  Licensed Education officer, environmental Health System  Mailing [place] [address] [place], [place] [address] Physical Address-[address], [place], [place] [address] Toll Free Main # [phone] Fax # [phone] Cell # [phone]  Office # [phone] [name]

## 2017-09-17 NOTE — Patient Outreach (Signed)
[facility]) Care Management [place] Coordination  09/17/2017  Nicoles Sedlacek. 1941/02/12 [id number]  Received secure message via from [place] notifying of patient's caregiver call to [place] office this morning, requesting call back.  Returned call to caregiver within hour of her call to [facility] office; successful telephone outreach to Garald Braver, patient's caregiver/ sister, on [facility] CM written consent on her mobile phone.  HIPAA/ identity verified with caregiver.  Caregiver reports today that patient remains at [facility] place for rehabilitation post- recent amputation; reports that she has concerns that patient will be discharged home near the end of the week, and states that she is not prepared to care for him at home, as she believes patient has "taken a turn for the worse" over the last few days.  Caregiver reports that patient fell at nursing home on Saturday 09/15/17, and that prior to fall, he had developed "bladder problems" and now has a foley catheter and is wearing diapers; reports that patient is unable to bear any weight and is not tolerating PT at SNF.  States that patient "seems worse off than he was before," and that she "knows" there is "no way" that she can safely bring patient back to her home to provide care for him in his current condition.  States patient is "bedridden, an invalid."  Caregiver was advised to contact the CSW and nursing staff at [facility] to discuss her concerns around patient's condition, upcoming discharge disposition, and her inability to safely care for him at home-- reports that she has already requested to speak with staff and that she is currently at SNF, waiting for staff to speak with her in person about her concerns.  Reports that patient has medicaid and that she believes he may ultimately need long-term facility placement.  Discussed with caregiver that I would make [facility] CSW and RN CCM aware of our  phone call today, and provided information/ encouragement/ positive reinforcement that caregiver has taken steps to make SNF staff aware of her concerns around patient's discharge disposition.  [name], [facility] CSW, by phone to update and left voicemail message notifying of today's call with patient's caregiver.  Plan:  Will notify via secure messaging through EMR, [facility] RN CCM and [facility] CSW of today's call with patient's caregiver  Oneta Rack, RN, BSN, [facility] Care Management  [phone]

## 2017-09-18 DIAGNOSIS — K5900 Constipation, unspecified: Secondary | ICD-10-CM | POA: Diagnosis not present

## 2017-09-18 DIAGNOSIS — R05 Cough: Secondary | ICD-10-CM | POA: Diagnosis not present

## 2017-09-18 DIAGNOSIS — N189 Chronic kidney disease, unspecified: Secondary | ICD-10-CM | POA: Diagnosis not present

## 2017-09-19 ENCOUNTER — Encounter: Payer: Self-pay | Admitting: Internal Medicine

## 2017-09-19 DIAGNOSIS — M25512 Pain in left shoulder: Secondary | ICD-10-CM | POA: Diagnosis not present

## 2017-09-19 DIAGNOSIS — M79671 Pain in right foot: Secondary | ICD-10-CM | POA: Diagnosis not present

## 2017-09-19 DIAGNOSIS — R2689 Other abnormalities of gait and mobility: Secondary | ICD-10-CM | POA: Diagnosis not present

## 2017-09-19 DIAGNOSIS — R339 Retention of urine, unspecified: Secondary | ICD-10-CM | POA: Diagnosis not present

## 2017-09-20 ENCOUNTER — Other Ambulatory Visit (INDEPENDENT_AMBULATORY_CARE_PROVIDER_SITE_OTHER): Payer: Medicare Other

## 2017-09-20 ENCOUNTER — Ambulatory Visit (INDEPENDENT_AMBULATORY_CARE_PROVIDER_SITE_OTHER): Payer: Medicare Other | Admitting: Physician Assistant

## 2017-09-20 ENCOUNTER — Encounter: Payer: Self-pay | Admitting: Physician Assistant

## 2017-09-20 VITALS — BP 124/58 | HR 74 | Ht 75.0 in | Wt 251.8 lb

## 2017-09-20 DIAGNOSIS — Z8719 Personal history of other diseases of the digestive system: Secondary | ICD-10-CM

## 2017-09-20 DIAGNOSIS — D649 Anemia, unspecified: Secondary | ICD-10-CM | POA: Diagnosis not present

## 2017-09-20 DIAGNOSIS — I2510 Atherosclerotic heart disease of native coronary artery without angina pectoris: Secondary | ICD-10-CM | POA: Diagnosis not present

## 2017-09-20 DIAGNOSIS — Z7901 Long term (current) use of anticoagulants: Secondary | ICD-10-CM | POA: Diagnosis not present

## 2017-09-20 LAB — CBC WITH DIFFERENTIAL/PLATELET
BASOS ABS: 0.1 10*3/uL (ref 0.0–0.1)
Basophils Relative: 0.7 % (ref 0.0–3.0)
EOS ABS: 0.8 10*3/uL — AB (ref 0.0–0.7)
Eosinophils Relative: 8.4 % — ABNORMAL HIGH (ref 0.0–5.0)
HEMATOCRIT: 30.9 % — AB (ref 39.0–52.0)
HEMOGLOBIN: 10.2 g/dL — AB (ref 13.0–17.0)
Lymphocytes Relative: 11.6 % — ABNORMAL LOW (ref 12.0–46.0)
Lymphs Abs: 1.1 10*3/uL (ref 0.7–4.0)
MCHC: 32.9 g/dL (ref 30.0–36.0)
MCV: 89.9 fl (ref 78.0–100.0)
MONOS PCT: 6.6 % (ref 3.0–12.0)
Monocytes Absolute: 0.6 10*3/uL (ref 0.1–1.0)
Neutro Abs: 7.1 10*3/uL (ref 1.4–7.7)
Neutrophils Relative %: 72.7 % (ref 43.0–77.0)
Platelets: 274 10*3/uL (ref 150.0–400.0)
RBC: 3.44 Mil/uL — AB (ref 4.22–5.81)
RDW: 23 % — ABNORMAL HIGH (ref 11.5–15.5)
WBC: 9.7 10*3/uL (ref 4.0–10.5)

## 2017-09-20 NOTE — Progress Notes (Signed)
Subjective:    Patient ID: Terry Martinez., male    DOB: May 25, 1940, 77 y.o.   MRN: [id number]  HPI Terry Martinez is a pleasant 77 year old white male, established with Dr. Fuller Plan. He comes in today for follow-up of recent capsule endoscopy. Patient has multiple comorbidities, and just recently required partial amputation of his right foot.  He is in rehab at [facility] place. Patient has hospitalization in May 2019 with acute lower GI bleed, painless in setting of chronic Xarelto.  He dropped his hemoglobin several grams.  Colonoscopy done at [facility] at that time with intubation of the terminal ileum was normal, did note large  hemorrhoids.  EGD was unremarkable. Capsule endoscopy was suggested as an outpatient.  This was done on 08/07/2017 here and unfortunately was an incomplete study, capsule did not reach the colon.  There was some mild nonspecific proximal small bowel erythema. Most recent labs available on 08/29/2017 hemoglobin 9.4 hematocrit of 31.3 which was stable. Apparently patient was briefly put back on Xarelto, had another episode of bleeding on one occasion and Xarelto was put back on hold over the past several weeks.  Patient's sister says that he supposed to have follow-up with Dr. Ross/cardiology. Patient has no current complaints.  He says he has not seen any blood, denies any melena.  He feels that his episode of hemorrhage was secondary to a ruptured hemorrhoid.  His appetite is been good, no nausea, no abdominal discomfort.  He does take MiraLAX daily and is having very regular bowel movements.  Review of Systems Pertinent positive and negative review of systems were noted in the above HPI section.  All other review of systems was otherwise negative.  Outpatient Encounter Medications as of 09/20/2017  Medication Sig  . acetaminophen (TYLENOL) 500 MG tablet Take 1,000 mg by mouth every 8 (eight) hours as needed for moderate pain or headache.   . allopurinol (ZYLOPRIM) 100 MG tablet TAKE  ONE TABLET BY MOUTH ONCE DAILY IN THE EVENING (Patient taking differently: Take 100 mg by mouth every evening. )  . budesonide-formoterol (SYMBICORT) 80-4.5 MCG/ACT inhaler Inhale 2 puffs into the lungs as needed (COPD). (Patient taking differently: Inhale 2 puffs into the lungs daily as needed (for COPD). )  . Carboxymethylcellul-Glycerin (LUBRICATING EYE DROPS OP) Place 1 drop into both eyes 4 (four) times daily as needed (dry eyes).   . Cholecalciferol (VITAMIN D-3 PO) Take 1 tablet by mouth every morning.   . Fluticasone-Umeclidin-Vilant (TRELEGY ELLIPTA) 100-62.5-25 MCG/INH AEPB Inhale 1 Dose into the lungs daily. (Patient taking differently: Inhale 1 puff into the lungs daily. )  . hydrALAZINE (APRESOLINE) 25 MG tablet Take 0.5 tablets (12.5 mg total) by mouth every 8 (eight) hours.  [name] HYDROcodone-acetaminophen (NORCO/VICODIN) 5-325 MG tablet Take 1 tablet by mouth 3 (three) times daily as needed for moderate pain.  . isosorbide mononitrate (IMDUR) 30 MG 24 hr tablet Take 15 mg by mouth daily.  . metolazone (ZAROXOLYN) 2.5 MG tablet Take one tablet 2 times per week, 30 minutes before torsemide for swelling (Patient taking differently: Take 2.5 mg by mouth every Monday. )  . metoprolol succinate (TOPROL-XL) 25 MG 24 hr tablet Take 0.5 tablets (12.5 mg total) by mouth daily.  . metroNIDAZOLE (METROGEL) 1 % gel Apply 1 application topically daily as needed (SKIN IRRITATION/ROSACEA.).   [name] mupirocin cream (BACTROBAN) 2 % Apply 1 application topically 2 (two) times daily. (Patient taking differently: Apply 1 application topically daily. )  . nitroGLYCERIN (NITROSTAT) 0.4 MG SL tablet  Place 1 tablet (0.4 mg total) under the tongue every 5 (five) minutes as needed for chest pain.  . polyethylene glycol powder (GLYCOLAX/MIRALAX) powder DISSOLVE 17G (1 CAPFUL) OF POWDER IN 8 OUNCES OF LIQUID AND DRINK 2 TIMES PER DAY AS NEEDED FOR MILD CONSTIPATION (Patient taking differently: DISSOLVE 17G (1 CAPFUL) OF  POWDER IN 8 OUNCES OF LIQUID AND DRINK DAILY)  . potassium chloride SA (K-DUR,KLOR-CON) 20 MEQ tablet Take 3 tablets (60 mEq total) by mouth 2 (two) times daily.  . pravastatin (PRAVACHOL) 40 MG tablet TAKE 1 TABLET BY MOUTH ONCE DAILY AT  6PM  . spironolactone (ALDACTONE) 25 MG tablet Take 1 tablet (25 mg total) by mouth daily.  [name] torsemide (DEMADEX) 20 MG tablet Take 3 tablets (60 mg total) by mouth 2 (two) times daily.  . vitamin C (ASCORBIC ACID) 500 MG tablet Take 500 mg by mouth daily.  . Rivaroxaban (XARELTO) 15 MG TABS tablet Take 1 tablet (15 mg total) by mouth daily with supper. (Patient not taking: Reported on 09/20/2017)   No facility-administered encounter medications on file as of 09/20/2017.    Allergies  Allergen Reactions  . Zocor [Simvastatin] Hives and Rash   Patient Active Problem List   Diagnosis Date Noted  . S/P transmetatarsal amputation of foot, right ([place]) 08/29/2017  . Subacute osteomyelitis, right ankle and foot ([place])   . Right heart failure ([place]) 06/18/2017  . Non-ST elevation (NSTEMI) myocardial infarction ([place]) 06/18/2017  . Skin tear of forearm without complication, right, initial encounter 05/19/2017  . COPD exacerbation ([place])   . Chronic renal insufficiency, stage IV (severe) ([place])   . S/P amputation of lesser toe, left ([place]) 03/08/2017  . Chronic combined systolic and diastolic heart failure ([place]) 01/26/2017  . Aortic insufficiency 01/26/2017  . Permanent atrial fibrillation ([place])   . Bilateral foot pain 11/17/2016  . Achilles tendon contracture, bilateral 09/25/2016  . Hx of adenomatous colonic polyps 07/28/2016  . PVD (peripheral vascular disease) ([place]) 04/17/2016  . Acquired absence of right great toe ([place]) 03/02/2016  . Diabetic Charct's arthropathy ([place]) 01/24/2016  . Venous stasis dermatitis of both lower extremities 01/24/2016  . S/P Left axillobifemoral bypass graft 08/11/2015  . Obesity 10/05/2014  . Hx of basal cell carcinoma 08/27/2013    . PAD (peripheral artery disease) ([place]) 12/05/2012  . HTN (hypertension) 12/05/2012  . HLD (hyperlipidemia) 12/05/2012  . CAD (coronary artery disease) 12/05/2012  . Gout 12/05/2012  . Preventative health care 12/05/2012  . Chronic anticoagulation 09/04/2012  . Abnormal prostate specific antigen 05/31/2011   Social History   Socioeconomic History  . Marital status: Single    Spouse name: Not on file  . Number of children: Not on file  . Years of education: Not on file  . Highest education level: Not on file  Occupational History  . Not on file  Social Needs  . Financial resource strain: Not on file  . Food insecurity:    Worry: Not on file    Inability: Not on file  . Transportation needs:    Medical: Not on file    Non-medical: Not on file  Tobacco Use  . Smoking status: Former Smoker    Packs/day: 1.00    Years: 44.00    Pack years: 44.00    Types: Cigarettes    Start date: 02/20/1954    Last attempt to quit: 07/22/1998    Years since quitting: 19.1  . Smokeless tobacco: Current User    Types: Snuff  Substance  and Sexual Activity  . Alcohol use: No    Alcohol/week: 0.0 oz  . Drug use: No  . Sexual activity: Never  Lifestyle  . Physical activity:    Days per week: Not on file    Minutes per session: Not on file  . Stress: Not on file  Relationships  . Social connections:    Talks on phone: Not on file    Gets together: Not on file    Attends religious service: Not on file    Active member of club or organization: Not on file    Attends meetings of clubs or organizations: Not on file    Relationship status: Not on file  . Intimate partner violence:    Fear of current or ex partner: Not on file    Emotionally abused: Not on file    Physically abused: Not on file    Forced sexual activity: Not on file  Other Topics Concern  . Not on file  Social History Narrative  . Not on file    Mr. Osmon's family history includes Alcohol abuse in his sister; Cancer  in his mother; Depression in his brother; Diabetes in his mother; Early death in his brother; Emphysema in his maternal grandfather; Heart disease in his brother, maternal grandfather, maternal grandmother, mother, and sister; Hyperlipidemia in his brother, mother, and sister; Hypertension in his brother, mother, and sister; Kidney disease in his maternal grandfather and maternal grandmother; Liver cancer in his brother; Lung cancer in his sister; Lymphoma in his father and mother; Stroke in his sister.      Objective:    Vitals:   09/20/17 1332  BP: (!) 124/58  Pulse: 74    Physical Exam; well-developed elderly white male in no acute distress, accompanied by sister, patient is in a wheelchair.  Blood pressure 124/58 pulse 74, height 6 foot 3, weight 251, BMI 31.4.  HEENT; nontraumatic normocephalic EOMI PERRLA sclera anicteric oropharynx benign, Cardiovascular ;irregular rate and rhythm no murmur rub or gallop, Pulmonary; clear bilaterally, Abdomen; obese, soft nontender, nondistended bowel sounds are active there is no palpable mass or hepatosplenomegaly, Foley catheter in place Rectal; exam not done, Extremities; patient is status post partial amputation of the right forefoot which is bandaged, Neuro psych; alert and oriented, grossly nonfocal mood and affect appropriate       Assessment & Plan:   #68 77 year old white male with recent history of GI bleed of unclear etiology, hospitalized at [facility] May 2019 and colonoscopy and upper endoscopy at that time unrevealing other than large hemorrhoids.  Bleeding occurred in setting of chronic anticoagulation with Xarelto. Subsequent capsule endoscopy June 2019 incomplete exam, capsule did not reach the colon, no significant abnormality on visualized small bowel. Patient has not had any recurrent bleeding over the past several weeks, has been off Xarelto.    No diverticuli documented at the time of recent colonoscopy nor on  prior colonoscopy in 2018  Bleeding may have been secondary to hemorrhoids, also consider small bowel source i.e. AVMs  #2 status post recent partial right foot amputation #3 congestive heart failure 5.  Atrial fibrillation 6.  COPD 7.  Stage IV chronic kidney disease 8.  Peripheral arterial disease status post left axillobifemoral bypass  Plan;Do not think patient is a good candidate for CT enterography with advanced chronic kidney disease. Discussed possible repeat capsule endoscopy with the patient, he is not interested in pursuing that at present. Think since he has been stable over  the past several weeks and doing well off Xarelto that  observation for now is appropriate. Patient will remain off Xarelto until he has follow-up with Dr. Ross/cardiology.  He may be an appropriate candidate to leave off anticoagulation permanently but will defer to cardiology. Repeat CBC today, will want to follow serial CBCs short-term. Plan was discussed in detail with the patient and his sister and they are agreeable with this approach.   I will plan to follow-up in the office in 6 weeks.  Terry Martinez S Sherrel Shafer PA-C 09/20/2017   Cc: Alveda Reasons, MD

## 2017-09-20 NOTE — Patient Instructions (Addendum)
Call us for any problems with bleeding. Stay off the Xarelto until you see Dr. Harrington Challenger.   Please call our office to make an appointment with Nicoletta Ba PA for middle of September.  Call: [phone]- choose option 2.   If you are age 77 or older, your body mass index should be between 23-30. Your Body mass index is 31.47 kg/m. If this is out of the aforementioned range listed, please consider follow up with your Primary Care Provider.  If you are

## 2017-09-20 NOTE — Progress Notes (Signed)
Reviewed and agree with management plan.  Virgal Warmuth T. Jaclyne Haverstick, MD FACG 

## 2017-09-21 ENCOUNTER — Telehealth: Payer: Self-pay | Admitting: Internal Medicine

## 2017-09-21 ENCOUNTER — Encounter: Payer: Self-pay | Admitting: *Deleted

## 2017-09-21 ENCOUNTER — Telehealth: Payer: Self-pay | Admitting: *Deleted

## 2017-09-21 ENCOUNTER — Telehealth (INDEPENDENT_AMBULATORY_CARE_PROVIDER_SITE_OTHER): Payer: Self-pay | Admitting: Orthopedic Surgery

## 2017-09-21 ENCOUNTER — Other Ambulatory Visit: Payer: Self-pay | Admitting: *Deleted

## 2017-09-21 DIAGNOSIS — R2689 Other abnormalities of gait and mobility: Secondary | ICD-10-CM | POA: Diagnosis not present

## 2017-09-21 DIAGNOSIS — M79671 Pain in right foot: Secondary | ICD-10-CM | POA: Diagnosis not present

## 2017-09-21 DIAGNOSIS — R339 Retention of urine, unspecified: Secondary | ICD-10-CM | POA: Diagnosis not present

## 2017-09-21 DIAGNOSIS — M25512 Pain in left shoulder: Secondary | ICD-10-CM | POA: Diagnosis not present

## 2017-09-21 NOTE — Telephone Encounter (Signed)
Spoke with patient's sister (DPR) Pt is s/p transmetatarsal amputation.  He remains non weight bearing at this time. At [facility] for rehab but may be longer term because now he has a urinary catheter and is seeing urology 09/24/17.  Was dc'd after surgery on Eliquis but saw GI yesterday and was instructed to hold blood thinner for now. He did the study with camera/pill but GI was unable to obtain any pictures of his small intestine all per pt's sister.  Scheduled appointment with Dr. Harrington Challenger for 10/08/17.  She is aware I am forwarding to Dr. Harrington Challenger for review and if there are any recommendations I will call her back.

## 2017-09-21 NOTE — Telephone Encounter (Signed)
Can you please call and offer appt Tuesday

## 2017-09-21 NOTE — Patient Outreach (Signed)
[facility]) Care Management  09/21/2017  Marcelino Campos. August 09, 1940 [id number]   CSW was able to meet with patient and patient's sister, Garald Braver today to perform a routine skilled nursing facility visit.  CSW met with patient and Mrs. Elks at [facility] [place] where patient currently resides to receive short-term rehabilitative services.  Patient appeared to be somewhat confused today, which was a bit concerning to Mrs. Elks.  [name] reported that she plans to speak with patient's attending nurse at [facility] to see about scheduling an appointment with patient's Primary Care Physician, Dr. Esmond Camper.   [name] reports that she is still unable to take patient home to live with her and her husband, as neither of them are able to provide patient with 24 hour care and supervision. [name] has made arrangements for patient to receive long-term care placement at [facility], and everything has been finalized with the admissions department.  Patient appears to be more accepting of the fact that he will not be able to return home, as patient is unable to perform activities of daily living independently. The plan is for patient to be moved into a Long-Term Care Medicaid bed today.  Patient still requires a Transfer board to be lifted from his bed into his wheelchair.  Patient continues to work with therapies, both physical and occupational, but progress is very slow.  Patient is wearing a diaper because Mrs. Elks reports that patient's bladder has "just completely stopped working".  [name] admits that she plans to continue to visit patient on a daily basis, as she is very concerned about patient's current medical condition.  [name] believes that patient's condition has rapidly declined since being admitted to [facility], but she is hoping that once patient becomes more acclimated to the facility that his condition will improve.  Patient had an  appointment with [facility] on Thursday, September 20, 2017, due to a GI bleed.  Patient has since been taken off of the blood thinners and no treatment course has been planned at this time.  Patient has two upcoming physician appointments next week, which Mrs. Elks plans to attend.  Patient's appointment with [facility] is scheduled for Monday, September 24, 2017 at 8:00AM to assess patient's bladder function.  Patient also has an appointment on Wednesday, September 26, 2017 at 2:15PM with Dr. Sharol Given to follow-up regarding patient's recent foot amputation.  CSW will perform a case closure on patient, as all goals of treatment have been met from social work standpoint and no additional social work needs have been identified at this time.  CSW will notify patient's RNCM with [place] Management, Tomasa Rand of CSW's plans to close patient's case.  CSW will fax an update to patient's Primary Care Physician, Dr. Esmond Camper to ensure that they are aware of CSW's involvement with patient's plan of care.    [name], BSW, MSW, LCSW  Licensed Education officer, [facility]  Mailing [place] [address], [place], [place] [address] Physical Address-[address], [place], [place] [address] Toll Free Main # [phone] Fax # [phone] Cell # [phone]  Office # [phone] [name]

## 2017-09-21 NOTE — Telephone Encounter (Signed)
Patient's sister called and wanted to know if there is any way that he can be seen on 09/25/17.  I advised her that Dr. Jess Barters schedule is full and that I would send you a message.  CB#[phone].  Thank you.

## 2017-09-21 NOTE — Patient Outreach (Signed)
[facility]) Care Management  09/21/2017  Abdul Beirne. [birth year] [id number]  Covering for assigned care coordinator Tomasa Rand.   Case Closure  Received in basket message form Deitra Mayo, [facility] LCSW that patient will remain in long term care at [facility].   Plan  Will close case - patient enrolled in another external program. Case Closure letter has been sent to notify PCP  Will update assigned care coordinator   Joylene Draft, RN, [place] Management Coordinator  [phone]- Mobile [phone]- [place]

## 2017-09-21 NOTE — Telephone Encounter (Signed)
New Message:      Pt's sister is calling and states the pt saw his pcp and states that the pt needs to be seen sooner than 10/3 due to patient having some other issues. She also states that the pt has had an amputation.

## 2017-09-21 NOTE — Telephone Encounter (Signed)
Faxed the office note from [place] dated 09-20-2017 to [facility].  Fax # [phone].

## 2017-09-22 NOTE — Telephone Encounter (Signed)
NO new recommendations    I will see on 8/19

## 2017-09-24 ENCOUNTER — Other Ambulatory Visit: Payer: Self-pay

## 2017-09-24 DIAGNOSIS — R338 Other retention of urine: Secondary | ICD-10-CM | POA: Diagnosis not present

## 2017-09-24 NOTE — Telephone Encounter (Signed)
Noted  

## 2017-09-24 NOTE — Telephone Encounter (Signed)
I attempted to contact patient to offer them an appointment earlier than Monday, August 12th.  Did not answer, LMOM for the patient to return the call to schedule an appointment.

## 2017-09-24 NOTE — Patient Outreach (Signed)
Care Coordination: Follow up call to sister Garald Braver. Reviewed current plan for long term placement. Sister said she would like to take patient home when he can participated in self care.   Reviewed case closure and encourage sister to call me if patient returned home.  Tomasa Rand, RN, BSN, CEN [facility] [employer] [phone]

## 2017-09-25 ENCOUNTER — Other Ambulatory Visit: Payer: Self-pay

## 2017-09-25 DIAGNOSIS — R2689 Other abnormalities of gait and mobility: Secondary | ICD-10-CM | POA: Diagnosis not present

## 2017-09-25 DIAGNOSIS — R339 Retention of urine, unspecified: Secondary | ICD-10-CM | POA: Diagnosis not present

## 2017-09-25 DIAGNOSIS — D649 Anemia, unspecified: Secondary | ICD-10-CM

## 2017-09-25 DIAGNOSIS — S41112A Laceration without foreign body of left upper arm, initial encounter: Secondary | ICD-10-CM | POA: Diagnosis not present

## 2017-09-25 DIAGNOSIS — M79671 Pain in right foot: Secondary | ICD-10-CM | POA: Diagnosis not present

## 2017-09-25 DIAGNOSIS — M25512 Pain in left shoulder: Secondary | ICD-10-CM | POA: Diagnosis not present

## 2017-09-25 DIAGNOSIS — Z89431 Acquired absence of right foot: Secondary | ICD-10-CM | POA: Diagnosis not present

## 2017-09-25 NOTE — Telephone Encounter (Signed)
I spoke with his wife and let he know the test result.  Also let her know to repeat CBC in 3 weeks.

## 2017-09-25 NOTE — Progress Notes (Signed)
cbc

## 2017-09-26 ENCOUNTER — Ambulatory Visit (INDEPENDENT_AMBULATORY_CARE_PROVIDER_SITE_OTHER): Payer: Medicare Other | Admitting: Orthopedic Surgery

## 2017-09-27 ENCOUNTER — Encounter (INDEPENDENT_AMBULATORY_CARE_PROVIDER_SITE_OTHER): Payer: Self-pay | Admitting: Orthopedic Surgery

## 2017-09-27 ENCOUNTER — Ambulatory Visit (INDEPENDENT_AMBULATORY_CARE_PROVIDER_SITE_OTHER): Payer: Medicare Other | Admitting: Orthopedic Surgery

## 2017-09-27 VITALS — Ht 75.0 in | Wt 251.0 lb

## 2017-09-27 DIAGNOSIS — R2689 Other abnormalities of gait and mobility: Secondary | ICD-10-CM | POA: Diagnosis not present

## 2017-09-27 DIAGNOSIS — Z89431 Acquired absence of right foot: Secondary | ICD-10-CM

## 2017-09-27 DIAGNOSIS — R339 Retention of urine, unspecified: Secondary | ICD-10-CM | POA: Diagnosis not present

## 2017-09-27 DIAGNOSIS — M25512 Pain in left shoulder: Secondary | ICD-10-CM | POA: Diagnosis not present

## 2017-09-27 DIAGNOSIS — M79671 Pain in right foot: Secondary | ICD-10-CM | POA: Diagnosis not present
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
## 2017-10-01 ENCOUNTER — Ambulatory Visit (INDEPENDENT_AMBULATORY_CARE_PROVIDER_SITE_OTHER): Payer: Medicare Other | Admitting: Orthopedic Surgery

## 2017-10-01 DIAGNOSIS — M79671 Pain in right foot: Secondary | ICD-10-CM | POA: Diagnosis not present

## 2017-10-01 DIAGNOSIS — R339 Retention of urine, unspecified: Secondary | ICD-10-CM | POA: Diagnosis not present

## 2017-10-01 DIAGNOSIS — R338 Other retention of urine: Secondary | ICD-10-CM | POA: Diagnosis not present

## 2017-10-01 DIAGNOSIS — M25512 Pain in left shoulder: Secondary | ICD-10-CM | POA: Diagnosis not present

## 2017-10-01 DIAGNOSIS — R2689 Other abnormalities of gait and mobility: Secondary | ICD-10-CM | POA: Diagnosis not present

## 2017-10-02 DIAGNOSIS — N400 Enlarged prostate without lower urinary tract symptoms: Secondary | ICD-10-CM | POA: Diagnosis not present

## 2017-10-03 DIAGNOSIS — M79671 Pain in right foot: Secondary | ICD-10-CM | POA: Diagnosis not present

## 2017-10-03 DIAGNOSIS — M25512 Pain in left shoulder: Secondary | ICD-10-CM | POA: Diagnosis not present

## 2017-10-03 DIAGNOSIS — R2689 Other abnormalities of gait and mobility: Secondary | ICD-10-CM | POA: Diagnosis not present

## 2017-10-04 DIAGNOSIS — R338 Other retention of urine: Secondary | ICD-10-CM | POA: Diagnosis not present

## 2017-10-04 DIAGNOSIS — I739 Peripheral vascular disease, unspecified: Secondary | ICD-10-CM | POA: Diagnosis not present

## 2017-10-04 DIAGNOSIS — K922 Gastrointestinal hemorrhage, unspecified: Secondary | ICD-10-CM | POA: Diagnosis not present

## 2017-10-04 DIAGNOSIS — Z95828 Presence of other vascular implants and grafts: Secondary | ICD-10-CM | POA: Diagnosis not present

## 2017-10-04 DIAGNOSIS — N184 Chronic kidney disease, stage 4 (severe): Secondary | ICD-10-CM | POA: Diagnosis not present

## 2017-10-04 DIAGNOSIS — I5042 Chronic combined systolic (congestive) and diastolic (congestive) heart failure: Secondary | ICD-10-CM | POA: Diagnosis not present

## 2017-10-04 DIAGNOSIS — I129 Hypertensive chronic kidney disease with stage 1 through stage 4 chronic kidney disease, or unspecified chronic kidney disease: Secondary | ICD-10-CM | POA: Diagnosis not present

## 2017-10-04 DIAGNOSIS — D631 Anemia in chronic kidney disease: Secondary | ICD-10-CM | POA: Diagnosis not present

## 2017-10-05 DIAGNOSIS — M79671 Pain in right foot: Secondary | ICD-10-CM | POA: Diagnosis not present

## 2017-10-05 DIAGNOSIS — Z89431 Acquired absence of right foot: Secondary | ICD-10-CM | POA: Diagnosis not present

## 2017-10-05 DIAGNOSIS — I4891 Unspecified atrial fibrillation: Secondary | ICD-10-CM | POA: Diagnosis not present

## 2017-10-05 DIAGNOSIS — I10 Essential (primary) hypertension: Secondary | ICD-10-CM | POA: Diagnosis not present

## 2017-10-05 DIAGNOSIS — M25512 Pain in left shoulder: Secondary | ICD-10-CM | POA: Diagnosis not present

## 2017-10-05 DIAGNOSIS — R2689 Other abnormalities of gait and mobility: Secondary | ICD-10-CM | POA: Diagnosis not present

## 2017-10-05 DIAGNOSIS — J449 Chronic obstructive pulmonary disease, unspecified: Secondary | ICD-10-CM | POA: Diagnosis not present

## 2017-10-08 ENCOUNTER — Encounter: Payer: Self-pay | Admitting: Internal Medicine

## 2017-10-08 ENCOUNTER — Ambulatory Visit (INDEPENDENT_AMBULATORY_CARE_PROVIDER_SITE_OTHER): Payer: Medicare Other | Admitting: Internal Medicine

## 2017-10-08 ENCOUNTER — Ambulatory Visit (INDEPENDENT_AMBULATORY_CARE_PROVIDER_SITE_OTHER): Payer: Medicare Other | Admitting: Orthopedic Surgery

## 2017-10-08 VITALS — BP 136/60 | HR 72 | Ht 75.0 in | Wt 246.0 lb

## 2017-10-08 DIAGNOSIS — I4821 Permanent atrial fibrillation: Secondary | ICD-10-CM

## 2017-10-08 DIAGNOSIS — I739 Peripheral vascular disease, unspecified: Secondary | ICD-10-CM

## 2017-10-08 DIAGNOSIS — E782 Mixed hyperlipidemia: Secondary | ICD-10-CM | POA: Diagnosis not present

## 2017-10-08 DIAGNOSIS — I5042 Chronic combined systolic (congestive) and diastolic (congestive) heart failure: Secondary | ICD-10-CM

## 2017-10-08 DIAGNOSIS — I351 Nonrheumatic aortic (valve) insufficiency: Secondary | ICD-10-CM | POA: Diagnosis not present

## 2017-10-08 DIAGNOSIS — Z89422 Acquired absence of other left toe(s): Secondary | ICD-10-CM | POA: Diagnosis not present

## 2017-10-08 DIAGNOSIS — I2510 Atherosclerotic heart disease of native coronary artery without angina pectoris: Secondary | ICD-10-CM | POA: Diagnosis not present

## 2017-10-08 DIAGNOSIS — I482 Chronic atrial fibrillation: Secondary | ICD-10-CM | POA: Diagnosis not present
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
## 2017-10-08 NOTE — Patient Instructions (Addendum)
Medication Instructions:  Your physician recommends that you continue on your current medications as directed. Please refer to the Current Medication list given to you today.   Labwork: .none   Testing/Procedures: none  Follow-Up: Your physician recommends that you schedule a follow-up appointment in: 6 weeks.  Any Other Special Instructions Will Be Listed Below (If Applicable).  Addendum: labs requested from Dr. Sanda Klein office   If you need a refill on your cardiac medications before your next appointment, please call your pharmacy.

## 2017-10-09 DIAGNOSIS — R338 Other retention of urine: Secondary | ICD-10-CM | POA: Diagnosis not present

## 2017-10-11 DIAGNOSIS — M79671 Pain in right foot: Secondary | ICD-10-CM | POA: Diagnosis not present

## 2017-10-11 DIAGNOSIS — R2689 Other abnormalities of gait and mobility: Secondary | ICD-10-CM | POA: Diagnosis not present

## 2017-10-11 DIAGNOSIS — M25512 Pain in left shoulder: Secondary | ICD-10-CM | POA: Diagnosis not present

## 2017-10-12 ENCOUNTER — Telehealth: Payer: Self-pay | Admitting: Internal Medicine

## 2017-10-12 NOTE — Telephone Encounter (Signed)
New Message:   Dr. Jamal Maes is question if there will be changes due to the pt lab results

## 2017-10-15 NOTE — Telephone Encounter (Signed)
Abnormal BMET results scanned into patient's chart for Dr. Harrington Challenger to review.

## 2017-10-15 NOTE — Telephone Encounter (Signed)
Dr. Sanda Klein nurse Arville Go called and wanted to make sure Dr. Harrington Challenger reviewed patient's lab work that their office sent over. Dr. Lorrene Reid wanted Dr. Harrington Challenger to advise on diuretic since she is managing patient's heart failure. Will forward to Dr. Harrington Challenger and her nurse.

## 2017-10-16 ENCOUNTER — Ambulatory Visit: Payer: Medicare Other | Admitting: Podiatry

## 2017-10-16 ENCOUNTER — Telehealth: Payer: Self-pay | Admitting: Internal Medicine

## 2017-10-16 MED ORDER — METOLAZONE 2.5 MG PO TABS: ORAL_TABLET | ORAL | 3 refills | Status: DC

## 2017-10-16 NOTE — Telephone Encounter (Signed)
I spoke with Diane at [facility] and then faxed orders per lab notes from Dr. Harrington Challenger:   Notes recorded by Fay Records, MD on 10/15/2017 at 4:44 PM EDT Cr is up to 2.8 Don't take metalozone this week until Saturday Take weekly Cut torsemide to daily for a couple days then resume bid BMET next Monday  Labs will be drawn Tues with results faxed to Dr. Harrington Challenger.

## 2017-10-16 NOTE — Telephone Encounter (Signed)
New Message   Diane with [facility] states that she received a medication change notice from Dr. Harrington Challenger but states she needs something faxed. Please call fax: [phone]

## 2017-10-17 DIAGNOSIS — M79671 Pain in right foot: Secondary | ICD-10-CM | POA: Diagnosis not present

## 2017-10-17 DIAGNOSIS — R2689 Other abnormalities of gait and mobility: Secondary | ICD-10-CM | POA: Diagnosis not present

## 2017-10-17 DIAGNOSIS — M25512 Pain in left shoulder: Secondary | ICD-10-CM | POA: Diagnosis not present

## 2017-10-21 DIAGNOSIS — I10 Essential (primary) hypertension: Secondary | ICD-10-CM | POA: Diagnosis not present

## 2017-10-21 DIAGNOSIS — N186 End stage renal disease: Secondary | ICD-10-CM | POA: Diagnosis not present

## 2017-10-21 DIAGNOSIS — D631 Anemia in chronic kidney disease: Secondary | ICD-10-CM | POA: Diagnosis not present

## 2017-10-24 DIAGNOSIS — N179 Acute kidney failure, unspecified: Secondary | ICD-10-CM | POA: Diagnosis not present

## 2017-10-24 DIAGNOSIS — I509 Heart failure, unspecified: Secondary | ICD-10-CM | POA: Diagnosis not present

## 2017-10-24 DIAGNOSIS — N189 Chronic kidney disease, unspecified: Secondary | ICD-10-CM | POA: Diagnosis not present

## 2017-10-25 DIAGNOSIS — I509 Heart failure, unspecified: Secondary | ICD-10-CM | POA: Diagnosis not present

## 2017-10-25 DIAGNOSIS — N189 Chronic kidney disease, unspecified: Secondary | ICD-10-CM | POA: Diagnosis not present

## 2017-10-25 DIAGNOSIS — N179 Acute kidney failure, unspecified: Secondary | ICD-10-CM | POA: Diagnosis not present

## 2017-10-26 DIAGNOSIS — M25512 Pain in left shoulder: Secondary | ICD-10-CM | POA: Diagnosis not present

## 2017-10-26 DIAGNOSIS — Z89431 Acquired absence of right foot: Secondary | ICD-10-CM | POA: Diagnosis not present

## 2017-10-26 DIAGNOSIS — R2689 Other abnormalities of gait and mobility: Secondary | ICD-10-CM | POA: Diagnosis not present

## 2017-10-26 DIAGNOSIS — M79671 Pain in right foot: Secondary | ICD-10-CM | POA: Diagnosis not present

## 2017-10-26 DIAGNOSIS — M869 Osteomyelitis, unspecified: Secondary | ICD-10-CM | POA: Diagnosis not present

## 2017-10-26 DIAGNOSIS — E119 Type 2 diabetes mellitus without complications: Secondary | ICD-10-CM | POA: Diagnosis not present

## 2017-10-26 DIAGNOSIS — I10 Essential (primary) hypertension: Secondary | ICD-10-CM | POA: Diagnosis not present

## 2017-10-30 ENCOUNTER — Other Ambulatory Visit: Payer: Self-pay | Admitting: *Deleted

## 2017-10-30 DIAGNOSIS — N184 Chronic kidney disease, stage 4 (severe): Secondary | ICD-10-CM | POA: Diagnosis not present

## 2017-10-30 DIAGNOSIS — R112 Nausea with vomiting, unspecified: Secondary | ICD-10-CM | POA: Diagnosis not present

## 2017-10-30 DIAGNOSIS — Z951 Presence of aortocoronary bypass graft: Secondary | ICD-10-CM | POA: Diagnosis not present

## 2017-10-30 DIAGNOSIS — Z8679 Personal history of other diseases of the circulatory system: Secondary | ICD-10-CM | POA: Diagnosis not present

## 2017-10-30 DIAGNOSIS — Z992 Dependence on renal dialysis: Secondary | ICD-10-CM | POA: Diagnosis not present

## 2017-10-30 DIAGNOSIS — N179 Acute kidney failure, unspecified: Secondary | ICD-10-CM | POA: Diagnosis not present

## 2017-10-30 DIAGNOSIS — I132 Hypertensive heart and chronic kidney disease with heart failure and with stage 5 chronic kidney disease, or end stage renal disease: Secondary | ICD-10-CM | POA: Diagnosis not present

## 2017-10-30 DIAGNOSIS — I509 Heart failure, unspecified: Secondary | ICD-10-CM | POA: Diagnosis not present

## 2017-10-30 DIAGNOSIS — R7989 Other specified abnormal findings of blood chemistry: Secondary | ICD-10-CM | POA: Diagnosis not present

## 2017-10-30 DIAGNOSIS — E860 Dehydration: Secondary | ICD-10-CM | POA: Diagnosis present

## 2017-10-30 DIAGNOSIS — R159 Full incontinence of feces: Secondary | ICD-10-CM | POA: Diagnosis present

## 2017-10-30 DIAGNOSIS — J449 Chronic obstructive pulmonary disease, unspecified: Secondary | ICD-10-CM | POA: Diagnosis present

## 2017-10-30 DIAGNOSIS — D509 Iron deficiency anemia, unspecified: Secondary | ICD-10-CM | POA: Diagnosis not present

## 2017-10-30 DIAGNOSIS — I739 Peripheral vascular disease, unspecified: Secondary | ICD-10-CM | POA: Diagnosis present

## 2017-10-30 DIAGNOSIS — R918 Other nonspecific abnormal finding of lung field: Secondary | ICD-10-CM | POA: Diagnosis not present

## 2017-10-30 DIAGNOSIS — N183 Chronic kidney disease, stage 3 (moderate): Secondary | ICD-10-CM | POA: Diagnosis not present

## 2017-10-30 DIAGNOSIS — B9629 Other Escherichia coli [E. coli] as the cause of diseases classified elsewhere: Secondary | ICD-10-CM | POA: Diagnosis not present

## 2017-10-30 DIAGNOSIS — I130 Hypertensive heart and chronic kidney disease with heart failure and stage 1 through stage 4 chronic kidney disease, or unspecified chronic kidney disease: Secondary | ICD-10-CM | POA: Diagnosis not present

## 2017-10-30 DIAGNOSIS — R279 Unspecified lack of coordination: Secondary | ICD-10-CM | POA: Diagnosis not present

## 2017-10-30 DIAGNOSIS — L89619 Pressure ulcer of right heel, unspecified stage: Secondary | ICD-10-CM | POA: Diagnosis present

## 2017-10-30 DIAGNOSIS — M6281 Muscle weakness (generalized): Secondary | ICD-10-CM | POA: Diagnosis not present

## 2017-10-30 DIAGNOSIS — I10 Essential (primary) hypertension: Secondary | ICD-10-CM | POA: Diagnosis not present

## 2017-10-30 DIAGNOSIS — I480 Paroxysmal atrial fibrillation: Secondary | ICD-10-CM | POA: Diagnosis present

## 2017-10-30 DIAGNOSIS — S299XXA Unspecified injury of thorax, initial encounter: Secondary | ICD-10-CM | POA: Diagnosis not present

## 2017-10-30 DIAGNOSIS — E785 Hyperlipidemia, unspecified: Secondary | ICD-10-CM | POA: Diagnosis present

## 2017-10-30 DIAGNOSIS — I959 Hypotension, unspecified: Secondary | ICD-10-CM | POA: Diagnosis not present

## 2017-10-30 DIAGNOSIS — I481 Persistent atrial fibrillation: Secondary | ICD-10-CM | POA: Diagnosis not present

## 2017-10-30 DIAGNOSIS — R278 Other lack of coordination: Secondary | ICD-10-CM | POA: Diagnosis not present

## 2017-10-30 DIAGNOSIS — I2781 Cor pulmonale (chronic): Secondary | ICD-10-CM | POA: Diagnosis present

## 2017-10-30 DIAGNOSIS — I5082 Biventricular heart failure: Secondary | ICD-10-CM | POA: Diagnosis present

## 2017-10-30 DIAGNOSIS — R488 Other symbolic dysfunctions: Secondary | ICD-10-CM | POA: Diagnosis not present

## 2017-10-30 DIAGNOSIS — Z4901 Encounter for fitting and adjustment of extracorporeal dialysis catheter: Secondary | ICD-10-CM | POA: Diagnosis not present

## 2017-10-30 DIAGNOSIS — I5022 Chronic systolic (congestive) heart failure: Secondary | ICD-10-CM | POA: Diagnosis not present

## 2017-10-30 DIAGNOSIS — I2510 Atherosclerotic heart disease of native coronary artery without angina pectoris: Secondary | ICD-10-CM | POA: Diagnosis present

## 2017-10-30 DIAGNOSIS — I4901 Ventricular fibrillation: Secondary | ICD-10-CM | POA: Diagnosis not present

## 2017-10-30 DIAGNOSIS — I4891 Unspecified atrial fibrillation: Secondary | ICD-10-CM | POA: Diagnosis not present

## 2017-10-30 DIAGNOSIS — I5020 Unspecified systolic (congestive) heart failure: Secondary | ICD-10-CM | POA: Diagnosis not present

## 2017-10-30 DIAGNOSIS — E875 Hyperkalemia: Secondary | ICD-10-CM | POA: Diagnosis not present

## 2017-10-30 DIAGNOSIS — D649 Anemia, unspecified: Secondary | ICD-10-CM | POA: Diagnosis present

## 2017-10-30 DIAGNOSIS — I252 Old myocardial infarction: Secondary | ICD-10-CM | POA: Diagnosis not present

## 2017-10-30 DIAGNOSIS — R197 Diarrhea, unspecified: Secondary | ICD-10-CM | POA: Diagnosis present

## 2017-10-30 DIAGNOSIS — I4589 Other specified conduction disorders: Secondary | ICD-10-CM | POA: Diagnosis not present

## 2017-10-30 DIAGNOSIS — N19 Unspecified kidney failure: Secondary | ICD-10-CM | POA: Diagnosis not present

## 2017-10-30 DIAGNOSIS — R2689 Other abnormalities of gait and mobility: Secondary | ICD-10-CM | POA: Diagnosis not present

## 2017-10-30 DIAGNOSIS — R531 Weakness: Secondary | ICD-10-CM | POA: Diagnosis not present

## 2017-10-30 DIAGNOSIS — L89610 Pressure ulcer of right heel, unstageable: Secondary | ICD-10-CM | POA: Diagnosis not present

## 2017-10-30 DIAGNOSIS — B9620 Unspecified Escherichia coli [E. coli] as the cause of diseases classified elsewhere: Secondary | ICD-10-CM | POA: Diagnosis present

## 2017-10-30 DIAGNOSIS — R9431 Abnormal electrocardiogram [ECG] [EKG]: Secondary | ICD-10-CM | POA: Diagnosis not present

## 2017-10-30 DIAGNOSIS — R5381 Other malaise: Secondary | ICD-10-CM | POA: Diagnosis not present

## 2017-10-30 DIAGNOSIS — E861 Hypovolemia: Secondary | ICD-10-CM | POA: Diagnosis present

## 2017-10-30 DIAGNOSIS — N3000 Acute cystitis without hematuria: Secondary | ICD-10-CM | POA: Diagnosis not present

## 2017-10-30 DIAGNOSIS — Z955 Presence of coronary angioplasty implant and graft: Secondary | ICD-10-CM | POA: Diagnosis not present

## 2017-10-30 DIAGNOSIS — I50810 Right heart failure, unspecified: Secondary | ICD-10-CM | POA: Diagnosis not present

## 2017-10-30 DIAGNOSIS — Z89431 Acquired absence of right foot: Secondary | ICD-10-CM | POA: Diagnosis not present

## 2017-10-30 DIAGNOSIS — N186 End stage renal disease: Secondary | ICD-10-CM | POA: Diagnosis present

## 2017-10-30 DIAGNOSIS — E871 Hypo-osmolality and hyponatremia: Secondary | ICD-10-CM | POA: Diagnosis present

## 2017-10-30 DIAGNOSIS — N390 Urinary tract infection, site not specified: Secondary | ICD-10-CM | POA: Diagnosis not present

## 2017-10-30 DIAGNOSIS — Z4781 Encounter for orthopedic aftercare following surgical amputation: Secondary | ICD-10-CM | POA: Diagnosis not present

## 2017-10-30 DIAGNOSIS — Z743 Need for continuous supervision: Secondary | ICD-10-CM | POA: Diagnosis not present

## 2017-10-30 DIAGNOSIS — I5042 Chronic combined systolic (congestive) and diastolic (congestive) heart failure: Secondary | ICD-10-CM | POA: Diagnosis not present

## 2017-10-30 DIAGNOSIS — N170 Acute kidney failure with tubular necrosis: Secondary | ICD-10-CM | POA: Diagnosis not present

## 2017-11-02 ENCOUNTER — Telehealth: Payer: Self-pay | Admitting: Internal Medicine

## 2017-11-02 NOTE — Telephone Encounter (Signed)
New message  Patient's sister wants to discuss patient being in the [facility] and his condition. Please call to discuss.

## 2017-11-02 NOTE — Telephone Encounter (Signed)
Wanted to provide update:  Was d'cd from [facility] last Saturday. By Monday was so sick, called EMS, went to [facility] Potassium high, kidney function worse.  Has a bacterial infection.    Plan will be to go back to [facility] on discharge from hospital.

## 2017-11-06 ENCOUNTER — Ambulatory Visit: Payer: Medicare Other | Admitting: Podiatry

## 2017-11-09 DIAGNOSIS — I509 Heart failure, unspecified: Secondary | ICD-10-CM | POA: Diagnosis not present

## 2017-11-09 DIAGNOSIS — I499 Cardiac arrhythmia, unspecified: Secondary | ICD-10-CM | POA: Diagnosis not present

## 2017-11-09 DIAGNOSIS — R278 Other lack of coordination: Secondary | ICD-10-CM | POA: Diagnosis not present

## 2017-11-09 DIAGNOSIS — J449 Chronic obstructive pulmonary disease, unspecified: Secondary | ICD-10-CM | POA: Diagnosis not present

## 2017-11-09 DIAGNOSIS — Z01818 Encounter for other preprocedural examination: Secondary | ICD-10-CM | POA: Diagnosis not present

## 2017-11-09 DIAGNOSIS — N179 Acute kidney failure, unspecified: Secondary | ICD-10-CM | POA: Diagnosis not present

## 2017-11-09 DIAGNOSIS — L97522 Non-pressure chronic ulcer of other part of left foot with fat layer exposed: Secondary | ICD-10-CM | POA: Diagnosis not present

## 2017-11-09 DIAGNOSIS — E875 Hyperkalemia: Secondary | ICD-10-CM | POA: Diagnosis not present

## 2017-11-09 DIAGNOSIS — E162 Hypoglycemia, unspecified: Secondary | ICD-10-CM | POA: Diagnosis not present

## 2017-11-09 DIAGNOSIS — R05 Cough: Secondary | ICD-10-CM | POA: Diagnosis not present

## 2017-11-09 DIAGNOSIS — Z95828 Presence of other vascular implants and grafts: Secondary | ICD-10-CM | POA: Diagnosis not present

## 2017-11-09 DIAGNOSIS — R279 Unspecified lack of coordination: Secondary | ICD-10-CM | POA: Diagnosis not present

## 2017-11-09 DIAGNOSIS — D509 Iron deficiency anemia, unspecified: Secondary | ICD-10-CM | POA: Diagnosis not present

## 2017-11-09 DIAGNOSIS — N186 End stage renal disease: Secondary | ICD-10-CM | POA: Diagnosis not present

## 2017-11-09 DIAGNOSIS — Z955 Presence of coronary angioplasty implant and graft: Secondary | ICD-10-CM | POA: Diagnosis not present

## 2017-11-09 DIAGNOSIS — D696 Thrombocytopenia, unspecified: Secondary | ICD-10-CM | POA: Diagnosis not present

## 2017-11-09 DIAGNOSIS — R404 Transient alteration of awareness: Secondary | ICD-10-CM | POA: Diagnosis not present

## 2017-11-09 DIAGNOSIS — I872 Venous insufficiency (chronic) (peripheral): Secondary | ICD-10-CM | POA: Diagnosis not present

## 2017-11-09 DIAGNOSIS — D649 Anemia, unspecified: Secondary | ICD-10-CM | POA: Diagnosis not present

## 2017-11-09 DIAGNOSIS — N184 Chronic kidney disease, stage 4 (severe): Secondary | ICD-10-CM | POA: Diagnosis not present

## 2017-11-09 DIAGNOSIS — L97521 Non-pressure chronic ulcer of other part of left foot limited to breakdown of skin: Secondary | ICD-10-CM | POA: Diagnosis not present

## 2017-11-09 DIAGNOSIS — M6281 Muscle weakness (generalized): Secondary | ICD-10-CM | POA: Diagnosis not present

## 2017-11-09 DIAGNOSIS — Z85820 Personal history of malignant melanoma of skin: Secondary | ICD-10-CM | POA: Diagnosis not present

## 2017-11-09 DIAGNOSIS — I10 Essential (primary) hypertension: Secondary | ICD-10-CM | POA: Diagnosis not present

## 2017-11-09 DIAGNOSIS — Z4781 Encounter for orthopedic aftercare following surgical amputation: Secondary | ICD-10-CM | POA: Diagnosis not present

## 2017-11-09 DIAGNOSIS — R5381 Other malaise: Secondary | ICD-10-CM | POA: Diagnosis not present

## 2017-11-09 DIAGNOSIS — E161 Other hypoglycemia: Secondary | ICD-10-CM | POA: Diagnosis not present

## 2017-11-09 DIAGNOSIS — R531 Weakness: Secondary | ICD-10-CM | POA: Diagnosis not present

## 2017-11-09 DIAGNOSIS — E119 Type 2 diabetes mellitus without complications: Secondary | ICD-10-CM | POA: Diagnosis not present

## 2017-11-09 DIAGNOSIS — Z743 Need for continuous supervision: Secondary | ICD-10-CM | POA: Diagnosis not present

## 2017-11-09 DIAGNOSIS — R5383 Other fatigue: Secondary | ICD-10-CM | POA: Diagnosis not present

## 2017-11-09 DIAGNOSIS — R0689 Other abnormalities of breathing: Secondary | ICD-10-CM | POA: Diagnosis not present

## 2017-11-09 DIAGNOSIS — I4901 Ventricular fibrillation: Secondary | ICD-10-CM | POA: Diagnosis not present

## 2017-11-09 DIAGNOSIS — N3000 Acute cystitis without hematuria: Secondary | ICD-10-CM | POA: Diagnosis not present

## 2017-11-09 DIAGNOSIS — I779 Disorder of arteries and arterioles, unspecified: Secondary | ICD-10-CM | POA: Diagnosis not present

## 2017-11-09 DIAGNOSIS — R600 Localized edema: Secondary | ICD-10-CM | POA: Diagnosis not present

## 2017-11-09 DIAGNOSIS — L89610 Pressure ulcer of right heel, unstageable: Secondary | ICD-10-CM | POA: Diagnosis not present

## 2017-11-09 DIAGNOSIS — Z23 Encounter for immunization: Secondary | ICD-10-CM | POA: Diagnosis not present

## 2017-11-09 DIAGNOSIS — I739 Peripheral vascular disease, unspecified: Secondary | ICD-10-CM | POA: Diagnosis not present

## 2017-11-09 DIAGNOSIS — L570 Actinic keratosis: Secondary | ICD-10-CM | POA: Diagnosis not present

## 2017-11-09 DIAGNOSIS — I129 Hypertensive chronic kidney disease with stage 1 through stage 4 chronic kidney disease, or unspecified chronic kidney disease: Secondary | ICD-10-CM | POA: Diagnosis not present

## 2017-11-09 DIAGNOSIS — D631 Anemia in chronic kidney disease: Secondary | ICD-10-CM | POA: Diagnosis not present

## 2017-11-09 DIAGNOSIS — R2689 Other abnormalities of gait and mobility: Secondary | ICD-10-CM | POA: Diagnosis not present

## 2017-11-09 DIAGNOSIS — I2510 Atherosclerotic heart disease of native coronary artery without angina pectoris: Secondary | ICD-10-CM | POA: Diagnosis not present

## 2017-11-09 DIAGNOSIS — B9629 Other Escherichia coli [E. coli] as the cause of diseases classified elsewhere: Secondary | ICD-10-CM | POA: Diagnosis not present

## 2017-11-09 DIAGNOSIS — Z89419 Acquired absence of unspecified great toe: Secondary | ICD-10-CM | POA: Diagnosis not present

## 2017-11-09 DIAGNOSIS — I70209 Unspecified atherosclerosis of native arteries of extremities, unspecified extremity: Secondary | ICD-10-CM | POA: Diagnosis not present

## 2017-11-09 DIAGNOSIS — N400 Enlarged prostate without lower urinary tract symptoms: Secondary | ICD-10-CM | POA: Diagnosis not present

## 2017-11-09 DIAGNOSIS — S91309A Unspecified open wound, unspecified foot, initial encounter: Secondary | ICD-10-CM | POA: Diagnosis not present

## 2017-11-09 DIAGNOSIS — Z89431 Acquired absence of right foot: Secondary | ICD-10-CM | POA: Diagnosis not present

## 2017-11-09 DIAGNOSIS — R488 Other symbolic dysfunctions: Secondary | ICD-10-CM | POA: Diagnosis not present

## 2017-11-09 DIAGNOSIS — N390 Urinary tract infection, site not specified: Secondary | ICD-10-CM | POA: Diagnosis not present

## 2017-11-09 DIAGNOSIS — N2581 Secondary hyperparathyroidism of renal origin: Secondary | ICD-10-CM | POA: Diagnosis not present

## 2017-11-09 DIAGNOSIS — L89899 Pressure ulcer of other site, unspecified stage: Secondary | ICD-10-CM | POA: Diagnosis not present

## 2017-11-09 DIAGNOSIS — R300 Dysuria: Secondary | ICD-10-CM | POA: Diagnosis not present

## 2017-11-09 DIAGNOSIS — I4891 Unspecified atrial fibrillation: Secondary | ICD-10-CM | POA: Diagnosis not present

## 2017-11-09 DIAGNOSIS — M109 Gout, unspecified: Secondary | ICD-10-CM | POA: Diagnosis not present

## 2017-11-09 DIAGNOSIS — N189 Chronic kidney disease, unspecified: Secondary | ICD-10-CM | POA: Diagnosis not present

## 2017-11-09 DIAGNOSIS — R197 Diarrhea, unspecified: Secondary | ICD-10-CM | POA: Diagnosis not present

## 2017-11-09 DIAGNOSIS — I8310 Varicose veins of unspecified lower extremity with inflammation: Secondary | ICD-10-CM | POA: Diagnosis not present

## 2017-11-09 DIAGNOSIS — M869 Osteomyelitis, unspecified: Secondary | ICD-10-CM | POA: Diagnosis not present

## 2017-11-09 DIAGNOSIS — Z8679 Personal history of other diseases of the circulatory system: Secondary | ICD-10-CM | POA: Diagnosis not present

## 2017-11-09 DIAGNOSIS — L97529 Non-pressure chronic ulcer of other part of left foot with unspecified severity: Secondary | ICD-10-CM | POA: Diagnosis not present

## 2017-11-09 DIAGNOSIS — Z992 Dependence on renal dialysis: Secondary | ICD-10-CM | POA: Diagnosis not present

## 2017-11-09 DIAGNOSIS — L0390 Cellulitis, unspecified: Secondary | ICD-10-CM | POA: Diagnosis not present

## 2017-11-09 DIAGNOSIS — Z125 Encounter for screening for malignant neoplasm of prostate: Secondary | ICD-10-CM | POA: Diagnosis not present

## 2017-11-12 DIAGNOSIS — R197 Diarrhea, unspecified: Secondary | ICD-10-CM | POA: Diagnosis not present

## 2017-11-12 DIAGNOSIS — N186 End stage renal disease: Secondary | ICD-10-CM | POA: Diagnosis not present

## 2017-11-12 DIAGNOSIS — I129 Hypertensive chronic kidney disease with stage 1 through stage 4 chronic kidney disease, or unspecified chronic kidney disease: Secondary | ICD-10-CM | POA: Diagnosis not present

## 2017-11-12 DIAGNOSIS — N2581 Secondary hyperparathyroidism of renal origin: Secondary | ICD-10-CM | POA: Diagnosis not present

## 2017-11-12 DIAGNOSIS — M869 Osteomyelitis, unspecified: Secondary | ICD-10-CM | POA: Diagnosis not present

## 2017-11-12 DIAGNOSIS — I4891 Unspecified atrial fibrillation: Secondary | ICD-10-CM | POA: Diagnosis not present

## 2017-11-12 DIAGNOSIS — D631 Anemia in chronic kidney disease: Secondary | ICD-10-CM | POA: Diagnosis not present

## 2017-11-14 DIAGNOSIS — D631 Anemia in chronic kidney disease: Secondary | ICD-10-CM | POA: Diagnosis not present

## 2017-11-14 DIAGNOSIS — I129 Hypertensive chronic kidney disease with stage 1 through stage 4 chronic kidney disease, or unspecified chronic kidney disease: Secondary | ICD-10-CM | POA: Diagnosis not present

## 2017-11-14 DIAGNOSIS — N2581 Secondary hyperparathyroidism of renal origin: Secondary | ICD-10-CM | POA: Diagnosis not present

## 2017-11-14 DIAGNOSIS — N186 End stage renal disease: Secondary | ICD-10-CM | POA: Diagnosis not present

## 2017-11-16 ENCOUNTER — Ambulatory Visit: Payer: Medicare Other | Admitting: Internal Medicine

## 2017-11-16 DIAGNOSIS — N2581 Secondary hyperparathyroidism of renal origin: Secondary | ICD-10-CM | POA: Diagnosis not present

## 2017-11-16 DIAGNOSIS — D631 Anemia in chronic kidney disease: Secondary | ICD-10-CM | POA: Diagnosis not present

## 2017-11-16 DIAGNOSIS — I129 Hypertensive chronic kidney disease with stage 1 through stage 4 chronic kidney disease, or unspecified chronic kidney disease: Secondary | ICD-10-CM | POA: Diagnosis not present

## 2017-11-16 DIAGNOSIS — N186 End stage renal disease: Secondary | ICD-10-CM | POA: Diagnosis not present

## 2017-11-19 ENCOUNTER — Encounter

## 2017-11-19 DIAGNOSIS — I509 Heart failure, unspecified: Secondary | ICD-10-CM | POA: Diagnosis not present

## 2017-11-19 DIAGNOSIS — I129 Hypertensive chronic kidney disease with stage 1 through stage 4 chronic kidney disease, or unspecified chronic kidney disease: Secondary | ICD-10-CM | POA: Diagnosis not present

## 2017-11-19 DIAGNOSIS — N186 End stage renal disease: Secondary | ICD-10-CM | POA: Diagnosis not present

## 2017-11-19 DIAGNOSIS — D631 Anemia in chronic kidney disease: Secondary | ICD-10-CM | POA: Diagnosis not present

## 2017-11-19 DIAGNOSIS — N400 Enlarged prostate without lower urinary tract symptoms: Secondary | ICD-10-CM | POA: Diagnosis not present

## 2017-11-19 DIAGNOSIS — Z992 Dependence on renal dialysis: Secondary | ICD-10-CM | POA: Diagnosis not present

## 2017-11-19 DIAGNOSIS — N2581 Secondary hyperparathyroidism of renal origin: Secondary | ICD-10-CM | POA: Diagnosis not present

## 2017-11-20 DIAGNOSIS — N186 End stage renal disease: Secondary | ICD-10-CM | POA: Diagnosis not present

## 2017-11-20 DIAGNOSIS — D631 Anemia in chronic kidney disease: Secondary | ICD-10-CM | POA: Diagnosis not present

## 2017-11-20 DIAGNOSIS — I10 Essential (primary) hypertension: Secondary | ICD-10-CM | POA: Diagnosis not present

## 2017-11-21 DIAGNOSIS — N2581 Secondary hyperparathyroidism of renal origin: Secondary | ICD-10-CM | POA: Diagnosis not present

## 2017-11-21 DIAGNOSIS — D631 Anemia in chronic kidney disease: Secondary | ICD-10-CM | POA: Diagnosis not present

## 2017-11-21 DIAGNOSIS — I129 Hypertensive chronic kidney disease with stage 1 through stage 4 chronic kidney disease, or unspecified chronic kidney disease: Secondary | ICD-10-CM | POA: Diagnosis not present

## 2017-11-21 DIAGNOSIS — D509 Iron deficiency anemia, unspecified: Secondary | ICD-10-CM | POA: Diagnosis not present

## 2017-11-21 DIAGNOSIS — I10 Essential (primary) hypertension: Secondary | ICD-10-CM | POA: Diagnosis not present

## 2017-11-21 DIAGNOSIS — N186 End stage renal disease: Secondary | ICD-10-CM | POA: Diagnosis not present

## 2017-11-22 ENCOUNTER — Ambulatory Visit: Payer: Medicare Other | Admitting: Internal Medicine

## 2017-11-22 DIAGNOSIS — J449 Chronic obstructive pulmonary disease, unspecified: Secondary | ICD-10-CM | POA: Diagnosis not present

## 2017-11-22 DIAGNOSIS — N186 End stage renal disease: Secondary | ICD-10-CM | POA: Diagnosis not present

## 2017-11-22 DIAGNOSIS — I4891 Unspecified atrial fibrillation: Secondary | ICD-10-CM | POA: Diagnosis not present

## 2017-11-22 DIAGNOSIS — I509 Heart failure, unspecified: Secondary | ICD-10-CM | POA: Diagnosis not present

## 2017-11-23 DIAGNOSIS — D509 Iron deficiency anemia, unspecified: Secondary | ICD-10-CM | POA: Diagnosis not present

## 2017-11-23 DIAGNOSIS — N186 End stage renal disease: Secondary | ICD-10-CM | POA: Diagnosis not present

## 2017-11-23 DIAGNOSIS — N2581 Secondary hyperparathyroidism of renal origin: Secondary | ICD-10-CM | POA: Diagnosis not present

## 2017-11-23 DIAGNOSIS — I10 Essential (primary) hypertension: Secondary | ICD-10-CM | POA: Diagnosis not present

## 2017-11-23 DIAGNOSIS — I129 Hypertensive chronic kidney disease with stage 1 through stage 4 chronic kidney disease, or unspecified chronic kidney disease: Secondary | ICD-10-CM | POA: Diagnosis not present

## 2017-11-23 DIAGNOSIS — D631 Anemia in chronic kidney disease: Secondary | ICD-10-CM | POA: Diagnosis not present

## 2017-11-26 DIAGNOSIS — D509 Iron deficiency anemia, unspecified: Secondary | ICD-10-CM | POA: Diagnosis not present

## 2017-11-26 DIAGNOSIS — I10 Essential (primary) hypertension: Secondary | ICD-10-CM | POA: Diagnosis not present

## 2017-11-26 DIAGNOSIS — N2581 Secondary hyperparathyroidism of renal origin: Secondary | ICD-10-CM | POA: Diagnosis not present

## 2017-11-26 DIAGNOSIS — I129 Hypertensive chronic kidney disease with stage 1 through stage 4 chronic kidney disease, or unspecified chronic kidney disease: Secondary | ICD-10-CM | POA: Diagnosis not present

## 2017-11-26 DIAGNOSIS — N186 End stage renal disease: Secondary | ICD-10-CM | POA: Diagnosis not present

## 2017-11-26 DIAGNOSIS — D631 Anemia in chronic kidney disease: Secondary | ICD-10-CM | POA: Diagnosis not present

## 2017-11-27 DIAGNOSIS — J449 Chronic obstructive pulmonary disease, unspecified: Secondary | ICD-10-CM | POA: Diagnosis not present

## 2017-11-27 DIAGNOSIS — I4891 Unspecified atrial fibrillation: Secondary | ICD-10-CM | POA: Diagnosis not present

## 2017-11-27 DIAGNOSIS — Z992 Dependence on renal dialysis: Secondary | ICD-10-CM | POA: Diagnosis not present

## 2017-11-27 DIAGNOSIS — N186 End stage renal disease: Secondary | ICD-10-CM | POA: Diagnosis not present

## 2017-11-28 DIAGNOSIS — I10 Essential (primary) hypertension: Secondary | ICD-10-CM | POA: Diagnosis not present

## 2017-11-28 DIAGNOSIS — N189 Chronic kidney disease, unspecified: Secondary | ICD-10-CM | POA: Diagnosis not present

## 2017-11-28 DIAGNOSIS — N2581 Secondary hyperparathyroidism of renal origin: Secondary | ICD-10-CM | POA: Diagnosis not present

## 2017-11-28 DIAGNOSIS — D509 Iron deficiency anemia, unspecified: Secondary | ICD-10-CM | POA: Diagnosis not present

## 2017-11-28 DIAGNOSIS — D649 Anemia, unspecified: Secondary | ICD-10-CM | POA: Diagnosis not present

## 2017-11-28 DIAGNOSIS — L89899 Pressure ulcer of other site, unspecified stage: Secondary | ICD-10-CM | POA: Diagnosis not present

## 2017-11-28 DIAGNOSIS — D631 Anemia in chronic kidney disease: Secondary | ICD-10-CM | POA: Diagnosis not present

## 2017-11-28 DIAGNOSIS — N186 End stage renal disease: Secondary | ICD-10-CM | POA: Diagnosis not present

## 2017-11-28 DIAGNOSIS — I129 Hypertensive chronic kidney disease with stage 1 through stage 4 chronic kidney disease, or unspecified chronic kidney disease: Secondary | ICD-10-CM | POA: Diagnosis not present

## 2017-11-30 ENCOUNTER — Telehealth: Payer: Self-pay | Admitting: Internal Medicine

## 2017-11-30 DIAGNOSIS — N186 End stage renal disease: Secondary | ICD-10-CM | POA: Diagnosis not present

## 2017-11-30 DIAGNOSIS — N2581 Secondary hyperparathyroidism of renal origin: Secondary | ICD-10-CM | POA: Diagnosis not present

## 2017-11-30 DIAGNOSIS — I10 Essential (primary) hypertension: Secondary | ICD-10-CM | POA: Diagnosis not present

## 2017-11-30 DIAGNOSIS — I129 Hypertensive chronic kidney disease with stage 1 through stage 4 chronic kidney disease, or unspecified chronic kidney disease: Secondary | ICD-10-CM | POA: Diagnosis not present

## 2017-11-30 DIAGNOSIS — D509 Iron deficiency anemia, unspecified: Secondary | ICD-10-CM | POA: Diagnosis not present

## 2017-11-30 DIAGNOSIS — D631 Anemia in chronic kidney disease: Secondary | ICD-10-CM | POA: Diagnosis not present

## 2017-11-30 NOTE — Telephone Encounter (Signed)
Returned call to patient's sister [name] (DPR on file). She states that the she had called before and cancelled his last appointment with Dr. Harrington Challenger. She states that he had been at rehab. She states that "his kidneys have stopped working" and now receives [facility] on MWF. She states that the kidney doctor would like for him to be seen by cardiology. She did not have the name of the nephrologist or the name of the practice. She states that she would like a call back from Dr. Harrington Challenger' RN when she returns to the office, because she was told that she could get her an appointment whenever she called back. Made her aware that she would not be back in the office until Tuesday. She states that that is fine and that the patient does not have any specific complaints at this time. She states that she will have the  information about the nephrologist available when [name] calls her back next week. Patient will need an appointment on either Tuesday or Thursday. She understands to call back to arrange appointment with APP if patient develops any Sx.

## 2017-11-30 NOTE — Telephone Encounter (Signed)
New Message        Patient would like a call back from [place] as sooon as possible, per [name] she told patient that anytime she needed to get her brother a appointment to contact her.

## 2017-12-03 DIAGNOSIS — I129 Hypertensive chronic kidney disease with stage 1 through stage 4 chronic kidney disease, or unspecified chronic kidney disease: Secondary | ICD-10-CM | POA: Diagnosis not present

## 2017-12-03 DIAGNOSIS — I10 Essential (primary) hypertension: Secondary | ICD-10-CM | POA: Diagnosis not present

## 2017-12-03 DIAGNOSIS — N2581 Secondary hyperparathyroidism of renal origin: Secondary | ICD-10-CM | POA: Diagnosis not present

## 2017-12-03 DIAGNOSIS — D509 Iron deficiency anemia, unspecified: Secondary | ICD-10-CM | POA: Diagnosis not present

## 2017-12-03 DIAGNOSIS — N186 End stage renal disease: Secondary | ICD-10-CM | POA: Diagnosis not present

## 2017-12-03 DIAGNOSIS — D631 Anemia in chronic kidney disease: Secondary | ICD-10-CM | POA: Diagnosis not present

## 2017-12-04 DIAGNOSIS — R600 Localized edema: Secondary | ICD-10-CM | POA: Diagnosis not present

## 2017-12-04 DIAGNOSIS — L570 Actinic keratosis: Secondary | ICD-10-CM | POA: Diagnosis not present

## 2017-12-04 DIAGNOSIS — Z85820 Personal history of malignant melanoma of skin: Secondary | ICD-10-CM | POA: Diagnosis not present

## 2017-12-04 DIAGNOSIS — I8310 Varicose veins of unspecified lower extremity with inflammation: Secondary | ICD-10-CM | POA: Diagnosis not present

## 2017-12-05 DIAGNOSIS — N186 End stage renal disease: Secondary | ICD-10-CM | POA: Diagnosis not present

## 2017-12-05 DIAGNOSIS — D631 Anemia in chronic kidney disease: Secondary | ICD-10-CM | POA: Diagnosis not present

## 2017-12-05 DIAGNOSIS — D509 Iron deficiency anemia, unspecified: Secondary | ICD-10-CM | POA: Diagnosis not present

## 2017-12-05 DIAGNOSIS — I129 Hypertensive chronic kidney disease with stage 1 through stage 4 chronic kidney disease, or unspecified chronic kidney disease: Secondary | ICD-10-CM | POA: Diagnosis not present

## 2017-12-05 DIAGNOSIS — N2581 Secondary hyperparathyroidism of renal origin: Secondary | ICD-10-CM | POA: Diagnosis not present

## 2017-12-05 DIAGNOSIS — I10 Essential (primary) hypertension: Secondary | ICD-10-CM | POA: Diagnosis not present

## 2017-12-06 DIAGNOSIS — E119 Type 2 diabetes mellitus without complications: Secondary | ICD-10-CM | POA: Diagnosis not present

## 2017-12-06 DIAGNOSIS — M869 Osteomyelitis, unspecified: Secondary | ICD-10-CM | POA: Diagnosis not present

## 2017-12-06 DIAGNOSIS — I10 Essential (primary) hypertension: Secondary | ICD-10-CM | POA: Diagnosis not present

## 2017-12-06 DIAGNOSIS — Z89431 Acquired absence of right foot: Secondary | ICD-10-CM | POA: Diagnosis not present

## 2017-12-06 DIAGNOSIS — N186 End stage renal disease: Secondary | ICD-10-CM | POA: Diagnosis not present

## 2017-12-06 NOTE — Telephone Encounter (Signed)
Left detailed message on patient's sister's VM (DPR) that I strongly suggest patient be seen by one of APPs on Dr. Alan Ripper care team because she will not have an opening on a Tue or [place] until February. Asked her to call back and schedule this.

## 2017-12-07 DIAGNOSIS — I10 Essential (primary) hypertension: Secondary | ICD-10-CM | POA: Diagnosis not present

## 2017-12-07 DIAGNOSIS — D631 Anemia in chronic kidney disease: Secondary | ICD-10-CM | POA: Diagnosis not present

## 2017-12-07 DIAGNOSIS — D509 Iron deficiency anemia, unspecified: Secondary | ICD-10-CM | POA: Diagnosis not present

## 2017-12-07 DIAGNOSIS — I129 Hypertensive chronic kidney disease with stage 1 through stage 4 chronic kidney disease, or unspecified chronic kidney disease: Secondary | ICD-10-CM | POA: Diagnosis not present

## 2017-12-07 DIAGNOSIS — N2581 Secondary hyperparathyroidism of renal origin: Secondary | ICD-10-CM | POA: Diagnosis not present

## 2017-12-07 DIAGNOSIS — N186 End stage renal disease: Secondary | ICD-10-CM | POA: Diagnosis not present

## 2017-12-10 DIAGNOSIS — D631 Anemia in chronic kidney disease: Secondary | ICD-10-CM | POA: Diagnosis not present

## 2017-12-10 DIAGNOSIS — D509 Iron deficiency anemia, unspecified: Secondary | ICD-10-CM | POA: Diagnosis not present

## 2017-12-10 DIAGNOSIS — I129 Hypertensive chronic kidney disease with stage 1 through stage 4 chronic kidney disease, or unspecified chronic kidney disease: Secondary | ICD-10-CM | POA: Diagnosis not present

## 2017-12-10 DIAGNOSIS — I10 Essential (primary) hypertension: Secondary | ICD-10-CM | POA: Diagnosis not present

## 2017-12-10 DIAGNOSIS — N2581 Secondary hyperparathyroidism of renal origin: Secondary | ICD-10-CM | POA: Diagnosis not present

## 2017-12-10 DIAGNOSIS — N186 End stage renal disease: Secondary | ICD-10-CM | POA: Diagnosis not present

## 2017-12-12 DIAGNOSIS — N2581 Secondary hyperparathyroidism of renal origin: Secondary | ICD-10-CM | POA: Diagnosis not present

## 2017-12-12 DIAGNOSIS — I10 Essential (primary) hypertension: Secondary | ICD-10-CM | POA: Diagnosis not present

## 2017-12-12 DIAGNOSIS — N186 End stage renal disease: Secondary | ICD-10-CM | POA: Diagnosis not present

## 2017-12-12 DIAGNOSIS — D631 Anemia in chronic kidney disease: Secondary | ICD-10-CM | POA: Diagnosis not present

## 2017-12-12 DIAGNOSIS — I129 Hypertensive chronic kidney disease with stage 1 through stage 4 chronic kidney disease, or unspecified chronic kidney disease: Secondary | ICD-10-CM | POA: Diagnosis not present

## 2017-12-12 DIAGNOSIS — D509 Iron deficiency anemia, unspecified: Secondary | ICD-10-CM | POA: Diagnosis not present

## 2017-12-14 DIAGNOSIS — N2581 Secondary hyperparathyroidism of renal origin: Secondary | ICD-10-CM | POA: Diagnosis not present

## 2017-12-14 DIAGNOSIS — D631 Anemia in chronic kidney disease: Secondary | ICD-10-CM | POA: Diagnosis not present

## 2017-12-14 DIAGNOSIS — I10 Essential (primary) hypertension: Secondary | ICD-10-CM | POA: Diagnosis not present

## 2017-12-14 DIAGNOSIS — N186 End stage renal disease: Secondary | ICD-10-CM | POA: Diagnosis not present

## 2017-12-14 DIAGNOSIS — D509 Iron deficiency anemia, unspecified: Secondary | ICD-10-CM | POA: Diagnosis not present

## 2017-12-14 DIAGNOSIS — I129 Hypertensive chronic kidney disease with stage 1 through stage 4 chronic kidney disease, or unspecified chronic kidney disease: Secondary | ICD-10-CM | POA: Diagnosis not present

## 2017-12-17 DIAGNOSIS — N186 End stage renal disease: Secondary | ICD-10-CM | POA: Diagnosis not present

## 2017-12-17 DIAGNOSIS — D631 Anemia in chronic kidney disease: Secondary | ICD-10-CM | POA: Diagnosis not present

## 2017-12-17 DIAGNOSIS — I10 Essential (primary) hypertension: Secondary | ICD-10-CM | POA: Diagnosis not present

## 2017-12-17 DIAGNOSIS — N2581 Secondary hyperparathyroidism of renal origin: Secondary | ICD-10-CM | POA: Diagnosis not present

## 2017-12-17 DIAGNOSIS — I129 Hypertensive chronic kidney disease with stage 1 through stage 4 chronic kidney disease, or unspecified chronic kidney disease: Secondary | ICD-10-CM | POA: Diagnosis not present

## 2017-12-17 DIAGNOSIS — D509 Iron deficiency anemia, unspecified: Secondary | ICD-10-CM | POA: Diagnosis not present

## 2017-12-18 ENCOUNTER — Ambulatory Visit (INDEPENDENT_AMBULATORY_CARE_PROVIDER_SITE_OTHER): Payer: Medicare Other | Admitting: Podiatry

## 2017-12-18 ENCOUNTER — Encounter: Payer: Self-pay | Admitting: Podiatry

## 2017-12-18 ENCOUNTER — Other Ambulatory Visit: Payer: Self-pay | Admitting: Podiatry

## 2017-12-18 ENCOUNTER — Ambulatory Visit (INDEPENDENT_AMBULATORY_CARE_PROVIDER_SITE_OTHER): Payer: Medicare Other

## 2017-12-18 VITALS — Temp 97.3°F

## 2017-12-18 DIAGNOSIS — M779 Enthesopathy, unspecified: Principal | ICD-10-CM

## 2017-12-18 DIAGNOSIS — L97501 Non-pressure chronic ulcer of other part of unspecified foot limited to breakdown of skin: Secondary | ICD-10-CM

## 2017-12-18 DIAGNOSIS — L97521 Non-pressure chronic ulcer of other part of left foot limited to breakdown of skin: Secondary | ICD-10-CM

## 2017-12-18 DIAGNOSIS — I2510 Atherosclerotic heart disease of native coronary artery without angina pectoris: Secondary | ICD-10-CM | POA: Diagnosis not present

## 2017-12-18 DIAGNOSIS — M778 Other enthesopathies, not elsewhere classified: Secondary | ICD-10-CM

## 2017-12-18 DIAGNOSIS — L97522 Non-pressure chronic ulcer of other part of left foot with fat layer exposed: Secondary | ICD-10-CM

## 2017-12-18 DIAGNOSIS — L89899 Pressure ulcer of other site, unspecified stage: Secondary | ICD-10-CM | POA: Diagnosis not present

## 2017-12-18 MED ORDER — DOXYCYCLINE HYCLATE 100 MG PO TABS: 100.0000 mg | ORAL_TABLET | Freq: Two times a day (BID) | ORAL | 0 refills | Status: AC

## 2017-12-19 DIAGNOSIS — I129 Hypertensive chronic kidney disease with stage 1 through stage 4 chronic kidney disease, or unspecified chronic kidney disease: Secondary | ICD-10-CM | POA: Diagnosis not present

## 2017-12-19 DIAGNOSIS — D509 Iron deficiency anemia, unspecified: Secondary | ICD-10-CM | POA: Diagnosis not present

## 2017-12-19 DIAGNOSIS — N186 End stage renal disease: Secondary | ICD-10-CM | POA: Diagnosis not present

## 2017-12-19 DIAGNOSIS — I10 Essential (primary) hypertension: Secondary | ICD-10-CM | POA: Diagnosis not present

## 2017-12-19 DIAGNOSIS — N2581 Secondary hyperparathyroidism of renal origin: Secondary | ICD-10-CM | POA: Diagnosis not present

## 2017-12-19 DIAGNOSIS — D631 Anemia in chronic kidney disease: Secondary | ICD-10-CM | POA: Diagnosis not present

## 2017-12-19 NOTE — Progress Notes (Signed)
Subjective: 77 year old male presents the office today for concerns of a wound to the onset aspect of the left foot.  He states is been ongoing for some time.  Is concerned because he they were told that the shoes that he was wearing were not fitting appropriately but the family also states that when they would go see him the shoes were not tied appropriately.  They think that they have been treated with Santyl to the wound.  They do think it is getting better but they are told but they are unsure.  Denies any drainage or pus coming from the area but there is a faint amount of redness around the wound.  The last saw him he is undergone a transmetatarsal imitation of the right foot and he is following up with Dr. Sharol Given later this week for the right foot. Denies any systemic complaints such as fevers, chills, nausea, vomiting. No acute changes since last appointment, and no other complaints at this time.   Objective: AAO x3, NAD DP/PT pulses palpable bilaterally, CRT less than 3 seconds On the left foot fifth metatarsal base there is a wound measuring approximately 1.5 x 1.5 cm with a fibrotic wound base.  There is no probing to bone there is no undermining or tunneling.  Faint surrounding rim of erythema but there is no ascending sialitis.  There is no fluctuation or crepitation or any malodor.  On the right foot status post transmetatarsal dictation there is a pinpoint opening to the distal aspect there is no drainage or pus.  No fluctuation or crepitation. No open lesions or pre-ulcerative lesions.  No pain with calf compression, swelling, warmth, erythema  Assessment: Ulceration left foot  Plan: -All treatment options discussed with the patient including all alternatives, risks, complications.  -X-rays were obtained reviewed.  No definite evidence of acute osteomyelitis identified at this time there is no soft tissue emphysema of the left foot.  I did try to debride the area but is very fibrotic.  He  had to cancel his appointment vascular but I do think he needs a vascular follow-up as well to the left foot.  I do recommend he continue with Santyl dressing changes daily as well as wearing the offloading shoe.  I did prescribe doxycycline given the mild surrounding erythema and faint edema to the area as a precaution. -Recommend follow-up with Dr. Sharol Given on the right foot later this week as scheduled. -Patient encouraged to call the office with any questions, concerns, change in symptoms.   Trula Slade DPM

## 2017-12-20 ENCOUNTER — Encounter (INDEPENDENT_AMBULATORY_CARE_PROVIDER_SITE_OTHER): Payer: Self-pay | Admitting: Physician Assistant

## 2017-12-20 ENCOUNTER — Ambulatory Visit (INDEPENDENT_AMBULATORY_CARE_PROVIDER_SITE_OTHER): Payer: No Typology Code available for payment source | Admitting: Orthopedic Surgery

## 2017-12-20 VITALS — Ht 75.0 in | Wt 246.0 lb

## 2017-12-20 DIAGNOSIS — I2510 Atherosclerotic heart disease of native coronary artery without angina pectoris: Secondary | ICD-10-CM | POA: Diagnosis not present

## 2017-12-20 DIAGNOSIS — Z89431 Acquired absence of right foot: Secondary | ICD-10-CM

## 2017-12-20 DIAGNOSIS — I872 Venous insufficiency (chronic) (peripheral): Secondary | ICD-10-CM | POA: Diagnosis not present

## 2017-12-20 DIAGNOSIS — L97521 Non-pressure chronic ulcer of other part of left foot limited to breakdown of skin: Secondary | ICD-10-CM

## 2017-12-21 ENCOUNTER — Encounter (HOSPITAL_COMMUNITY): Payer: Medicare Other

## 2017-12-21 ENCOUNTER — Ambulatory Visit: Payer: Medicare Other | Admitting: Family

## 2017-12-21 DIAGNOSIS — I10 Essential (primary) hypertension: Secondary | ICD-10-CM | POA: Diagnosis not present

## 2017-12-21 DIAGNOSIS — N186 End stage renal disease: Secondary | ICD-10-CM | POA: Diagnosis not present

## 2017-12-21 DIAGNOSIS — D509 Iron deficiency anemia, unspecified: Secondary | ICD-10-CM | POA: Diagnosis not present

## 2017-12-21 DIAGNOSIS — N2581 Secondary hyperparathyroidism of renal origin: Secondary | ICD-10-CM | POA: Diagnosis not present

## 2017-12-21 DIAGNOSIS — D631 Anemia in chronic kidney disease: Secondary | ICD-10-CM | POA: Diagnosis not present

## 2017-12-21 DIAGNOSIS — I129 Hypertensive chronic kidney disease with stage 1 through stage 4 chronic kidney disease, or unspecified chronic kidney disease: Secondary | ICD-10-CM | POA: Diagnosis not present

## 2017-12-24 DIAGNOSIS — D509 Iron deficiency anemia, unspecified: Secondary | ICD-10-CM | POA: Diagnosis not present

## 2017-12-24 DIAGNOSIS — N186 End stage renal disease: Secondary | ICD-10-CM | POA: Diagnosis not present

## 2017-12-24 DIAGNOSIS — I129 Hypertensive chronic kidney disease with stage 1 through stage 4 chronic kidney disease, or unspecified chronic kidney disease: Secondary | ICD-10-CM | POA: Diagnosis not present

## 2017-12-24 DIAGNOSIS — N2581 Secondary hyperparathyroidism of renal origin: Secondary | ICD-10-CM | POA: Diagnosis not present

## 2017-12-24 DIAGNOSIS — D631 Anemia in chronic kidney disease: Secondary | ICD-10-CM | POA: Diagnosis not present

## 2017-12-25 ENCOUNTER — Encounter (INDEPENDENT_AMBULATORY_CARE_PROVIDER_SITE_OTHER): Payer: Self-pay | Admitting: Orthopedic Surgery
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
## 2017-12-26 DIAGNOSIS — N2581 Secondary hyperparathyroidism of renal origin: Secondary | ICD-10-CM | POA: Diagnosis not present

## 2017-12-26 DIAGNOSIS — D509 Iron deficiency anemia, unspecified: Secondary | ICD-10-CM | POA: Diagnosis not present

## 2017-12-26 DIAGNOSIS — I129 Hypertensive chronic kidney disease with stage 1 through stage 4 chronic kidney disease, or unspecified chronic kidney disease: Secondary | ICD-10-CM | POA: Diagnosis not present

## 2017-12-26 DIAGNOSIS — N186 End stage renal disease: Secondary | ICD-10-CM | POA: Diagnosis not present

## 2017-12-26 DIAGNOSIS — D631 Anemia in chronic kidney disease: Secondary | ICD-10-CM | POA: Diagnosis not present

## 2017-12-28 DIAGNOSIS — D631 Anemia in chronic kidney disease: Secondary | ICD-10-CM | POA: Diagnosis not present

## 2017-12-28 DIAGNOSIS — N2581 Secondary hyperparathyroidism of renal origin: Secondary | ICD-10-CM | POA: Diagnosis not present

## 2017-12-28 DIAGNOSIS — I129 Hypertensive chronic kidney disease with stage 1 through stage 4 chronic kidney disease, or unspecified chronic kidney disease: Secondary | ICD-10-CM | POA: Diagnosis not present

## 2017-12-28 DIAGNOSIS — N186 End stage renal disease: Secondary | ICD-10-CM | POA: Diagnosis not present

## 2017-12-28 DIAGNOSIS — D509 Iron deficiency anemia, unspecified: Secondary | ICD-10-CM | POA: Diagnosis not present

## 2017-12-28 DIAGNOSIS — D696 Thrombocytopenia, unspecified: Secondary | ICD-10-CM | POA: Diagnosis not present

## 2017-12-28 DIAGNOSIS — D649 Anemia, unspecified: Secondary | ICD-10-CM | POA: Diagnosis not present

## 2017-12-31 ENCOUNTER — Ambulatory Visit (INDEPENDENT_AMBULATORY_CARE_PROVIDER_SITE_OTHER): Payer: Medicare Other | Admitting: Orthopedic Surgery

## 2017-12-31 DIAGNOSIS — N186 End stage renal disease: Secondary | ICD-10-CM | POA: Diagnosis not present

## 2017-12-31 DIAGNOSIS — I129 Hypertensive chronic kidney disease with stage 1 through stage 4 chronic kidney disease, or unspecified chronic kidney disease: Secondary | ICD-10-CM | POA: Diagnosis not present

## 2017-12-31 DIAGNOSIS — D509 Iron deficiency anemia, unspecified: Secondary | ICD-10-CM | POA: Diagnosis not present

## 2017-12-31 DIAGNOSIS — D631 Anemia in chronic kidney disease: Secondary | ICD-10-CM | POA: Diagnosis not present

## 2017-12-31 DIAGNOSIS — N2581 Secondary hyperparathyroidism of renal origin: Secondary | ICD-10-CM | POA: Diagnosis not present

## 2018-01-01 ENCOUNTER — Ambulatory Visit: Payer: Medicare Other | Admitting: Orthotics

## 2018-01-01 ENCOUNTER — Encounter: Payer: Self-pay | Admitting: Podiatry

## 2018-01-01 ENCOUNTER — Ambulatory Visit (INDEPENDENT_AMBULATORY_CARE_PROVIDER_SITE_OTHER): Payer: Medicare Other | Admitting: Podiatry

## 2018-01-01 VITALS — BP 111/53 | HR 73 | Temp 97.4°F | Resp 18

## 2018-01-01 DIAGNOSIS — L97522 Non-pressure chronic ulcer of other part of left foot with fat layer exposed: Secondary | ICD-10-CM

## 2018-01-01 DIAGNOSIS — M79675 Pain in left toe(s): Secondary | ICD-10-CM

## 2018-01-01 DIAGNOSIS — Z89419 Acquired absence of unspecified great toe: Secondary | ICD-10-CM

## 2018-01-01 DIAGNOSIS — I739 Peripheral vascular disease, unspecified: Secondary | ICD-10-CM

## 2018-01-01 DIAGNOSIS — I2510 Atherosclerotic heart disease of native coronary artery without angina pectoris: Secondary | ICD-10-CM | POA: Diagnosis not present

## 2018-01-01 DIAGNOSIS — M79674 Pain in right toe(s): Secondary | ICD-10-CM

## 2018-01-01 NOTE — Progress Notes (Signed)
Will modify shoe (L) to offload lateral ulcer once the ulcer has further healing.

## 2018-01-02 DIAGNOSIS — N186 End stage renal disease: Secondary | ICD-10-CM | POA: Diagnosis not present

## 2018-01-02 DIAGNOSIS — R300 Dysuria: Secondary | ICD-10-CM | POA: Diagnosis not present

## 2018-01-02 DIAGNOSIS — N2581 Secondary hyperparathyroidism of renal origin: Secondary | ICD-10-CM | POA: Diagnosis not present

## 2018-01-02 DIAGNOSIS — D509 Iron deficiency anemia, unspecified: Secondary | ICD-10-CM | POA: Diagnosis not present

## 2018-01-02 DIAGNOSIS — I129 Hypertensive chronic kidney disease with stage 1 through stage 4 chronic kidney disease, or unspecified chronic kidney disease: Secondary | ICD-10-CM | POA: Diagnosis not present

## 2018-01-02 DIAGNOSIS — D631 Anemia in chronic kidney disease: Secondary | ICD-10-CM | POA: Diagnosis not present

## 2018-01-02 NOTE — Progress Notes (Signed)
Subjective: 77 year old male presents the office for follow-up evaluation of wound to the left foot.  He states that he did see Dr. due to swelling about the leg.  He states that when he is living they have been wrapping the legs every 5 days they have packed the wound on the lateral aspect the left foot. Denies any systemic complaints such as fevers, chills, nausea, vomiting. No acute changes since last appointment, and no other complaints at this time.   Objective: AAO x3, NAD Wound to the distal portion of TMA site is doing much better on the right side there is no edema, erythema, drainage or pus.  The lateral aspect of the left foot the fifth metatarsal base continues to be ulceration measuring about the same size but is more fibrotic.  Wound measures approximate 1.6 x 1.6 cm with a depth of 0.3 cm.  There is no probing to bone, undermining or tunneling No open lesions or pre-ulcerative lesions.  No pain with calf compression, swelling, warmth, erythema  Assessment: Ulcerations bilateral  Plan: -All treatment options discussed with the patient including all alternatives, risks, complications.  -Today debrided some of the hyperkeratotic periwound in the left foot along the wound.  We will continue with daily dressing changes.  They can continue with dressing changes but I do not think it needs to be changed at least every other day.  The bandage that was applied have been there for some time it appears that there was some drainage.  Once I was able to clean it there was no purulence or any other signs of infection. -Patient encouraged to call the office with any questions, concerns, change in symptoms.   Trula Slade DPM

## 2018-01-04 DIAGNOSIS — D631 Anemia in chronic kidney disease: Secondary | ICD-10-CM | POA: Diagnosis not present

## 2018-01-04 DIAGNOSIS — N2581 Secondary hyperparathyroidism of renal origin: Secondary | ICD-10-CM | POA: Diagnosis not present

## 2018-01-04 DIAGNOSIS — N186 End stage renal disease: Secondary | ICD-10-CM | POA: Diagnosis not present

## 2018-01-04 DIAGNOSIS — I129 Hypertensive chronic kidney disease with stage 1 through stage 4 chronic kidney disease, or unspecified chronic kidney disease: Secondary | ICD-10-CM | POA: Diagnosis not present

## 2018-01-04 DIAGNOSIS — Z01818 Encounter for other preprocedural examination: Secondary | ICD-10-CM | POA: Diagnosis not present

## 2018-01-04 DIAGNOSIS — D509 Iron deficiency anemia, unspecified: Secondary | ICD-10-CM | POA: Diagnosis not present

## 2018-01-07 DIAGNOSIS — N186 End stage renal disease: Secondary | ICD-10-CM | POA: Diagnosis not present

## 2018-01-07 DIAGNOSIS — D631 Anemia in chronic kidney disease: Secondary | ICD-10-CM | POA: Diagnosis not present

## 2018-01-07 DIAGNOSIS — S91309A Unspecified open wound, unspecified foot, initial encounter: Secondary | ICD-10-CM | POA: Diagnosis not present

## 2018-01-07 DIAGNOSIS — D509 Iron deficiency anemia, unspecified: Secondary | ICD-10-CM | POA: Diagnosis not present

## 2018-01-07 DIAGNOSIS — I129 Hypertensive chronic kidney disease with stage 1 through stage 4 chronic kidney disease, or unspecified chronic kidney disease: Secondary | ICD-10-CM | POA: Diagnosis not present

## 2018-01-07 DIAGNOSIS — N3000 Acute cystitis without hematuria: Secondary | ICD-10-CM | POA: Diagnosis not present

## 2018-01-07 DIAGNOSIS — I10 Essential (primary) hypertension: Secondary | ICD-10-CM | POA: Diagnosis not present

## 2018-01-07 DIAGNOSIS — N2581 Secondary hyperparathyroidism of renal origin: Secondary | ICD-10-CM | POA: Diagnosis not present

## 2018-01-09 DIAGNOSIS — N3000 Acute cystitis without hematuria: Secondary | ICD-10-CM | POA: Diagnosis not present

## 2018-01-09 DIAGNOSIS — D509 Iron deficiency anemia, unspecified: Secondary | ICD-10-CM | POA: Diagnosis not present

## 2018-01-09 DIAGNOSIS — I129 Hypertensive chronic kidney disease with stage 1 through stage 4 chronic kidney disease, or unspecified chronic kidney disease: Secondary | ICD-10-CM | POA: Diagnosis not present

## 2018-01-09 DIAGNOSIS — L97529 Non-pressure chronic ulcer of other part of left foot with unspecified severity: Secondary | ICD-10-CM | POA: Diagnosis not present

## 2018-01-09 DIAGNOSIS — Z89431 Acquired absence of right foot: Secondary | ICD-10-CM | POA: Diagnosis not present

## 2018-01-09 DIAGNOSIS — N2581 Secondary hyperparathyroidism of renal origin: Secondary | ICD-10-CM | POA: Diagnosis not present

## 2018-01-09 DIAGNOSIS — D631 Anemia in chronic kidney disease: Secondary | ICD-10-CM | POA: Diagnosis not present

## 2018-01-09 DIAGNOSIS — N186 End stage renal disease: Secondary | ICD-10-CM | POA: Diagnosis not present

## 2018-01-10 ENCOUNTER — Ambulatory Visit (INDEPENDENT_AMBULATORY_CARE_PROVIDER_SITE_OTHER): Payer: Medicare Other | Admitting: Physician Assistant

## 2018-01-10 ENCOUNTER — Encounter (INDEPENDENT_AMBULATORY_CARE_PROVIDER_SITE_OTHER): Payer: Self-pay | Admitting: Orthopedic Surgery

## 2018-01-10 VITALS — Ht 75.0 in | Wt 246.0 lb

## 2018-01-10 DIAGNOSIS — Z89431 Acquired absence of right foot: Secondary | ICD-10-CM

## 2018-01-10 DIAGNOSIS — L97521 Non-pressure chronic ulcer of other part of left foot limited to breakdown of skin: Secondary | ICD-10-CM

## 2018-01-10 DIAGNOSIS — I872 Venous insufficiency (chronic) (peripheral): Secondary | ICD-10-CM

## 2018-01-10 DIAGNOSIS — I2510 Atherosclerotic heart disease of native coronary artery without angina pectoris: Secondary | ICD-10-CM

## 2018-01-11 ENCOUNTER — Encounter (INDEPENDENT_AMBULATORY_CARE_PROVIDER_SITE_OTHER): Payer: Self-pay | Admitting: Physician Assistant

## 2018-01-11 DIAGNOSIS — D509 Iron deficiency anemia, unspecified: Secondary | ICD-10-CM | POA: Diagnosis not present

## 2018-01-11 DIAGNOSIS — I129 Hypertensive chronic kidney disease with stage 1 through stage 4 chronic kidney disease, or unspecified chronic kidney disease: Secondary | ICD-10-CM | POA: Diagnosis not present

## 2018-01-11 DIAGNOSIS — N186 End stage renal disease: Secondary | ICD-10-CM | POA: Diagnosis not present

## 2018-01-11 DIAGNOSIS — D631 Anemia in chronic kidney disease: Secondary | ICD-10-CM | POA: Diagnosis not present

## 2018-01-11 DIAGNOSIS — N2581 Secondary hyperparathyroidism of renal origin: Secondary | ICD-10-CM | POA: Diagnosis not present
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
## 2018-01-13 DIAGNOSIS — N2581 Secondary hyperparathyroidism of renal origin: Secondary | ICD-10-CM | POA: Diagnosis not present

## 2018-01-13 DIAGNOSIS — D631 Anemia in chronic kidney disease: Secondary | ICD-10-CM | POA: Diagnosis not present

## 2018-01-13 DIAGNOSIS — N186 End stage renal disease: Secondary | ICD-10-CM | POA: Diagnosis not present

## 2018-01-13 DIAGNOSIS — I129 Hypertensive chronic kidney disease with stage 1 through stage 4 chronic kidney disease, or unspecified chronic kidney disease: Secondary | ICD-10-CM | POA: Diagnosis not present

## 2018-01-13 DIAGNOSIS — D509 Iron deficiency anemia, unspecified: Secondary | ICD-10-CM | POA: Diagnosis not present

## 2018-01-14 ENCOUNTER — Ambulatory Visit (INDEPENDENT_AMBULATORY_CARE_PROVIDER_SITE_OTHER): Payer: Medicare Other | Admitting: Family

## 2018-01-14 ENCOUNTER — Ambulatory Visit (HOSPITAL_COMMUNITY)
Admission: RE | Admit: 2018-01-14 | Discharge: 2018-01-14 | Disposition: A | Discharge status: Home / Self Care | Payer: Medicare Other | Source: Ambulatory Visit | Attending: Family | Admitting: Family

## 2018-01-14 ENCOUNTER — Ambulatory Visit (INDEPENDENT_AMBULATORY_CARE_PROVIDER_SITE_OTHER)
Admission: RE | Admit: 2018-01-14 | Discharge: 2018-01-14 | Disposition: A | Discharge status: Home / Self Care | Payer: Medicare Other | Source: Ambulatory Visit | Attending: Family | Admitting: Family

## 2018-01-14 ENCOUNTER — Other Ambulatory Visit: Payer: Self-pay

## 2018-01-14 ENCOUNTER — Encounter: Payer: Self-pay | Admitting: Family

## 2018-01-14 VITALS — BP 108/58 | HR 92 | Temp 97.0°F | Resp 20 | Ht 75.0 in | Wt 256.0 lb

## 2018-01-14 DIAGNOSIS — Z89431 Acquired absence of right foot: Secondary | ICD-10-CM

## 2018-01-14 DIAGNOSIS — I739 Peripheral vascular disease, unspecified: Secondary | ICD-10-CM | POA: Diagnosis not present

## 2018-01-14 DIAGNOSIS — L0390 Cellulitis, unspecified: Secondary | ICD-10-CM

## 2018-01-14 DIAGNOSIS — L97522 Non-pressure chronic ulcer of other part of left foot with fat layer exposed: Secondary | ICD-10-CM | POA: Diagnosis not present

## 2018-01-14 DIAGNOSIS — N189 Chronic kidney disease, unspecified: Secondary | ICD-10-CM | POA: Diagnosis not present

## 2018-01-14 DIAGNOSIS — N186 End stage renal disease: Secondary | ICD-10-CM | POA: Diagnosis not present

## 2018-01-14 DIAGNOSIS — I872 Venous insufficiency (chronic) (peripheral): Secondary | ICD-10-CM

## 2018-01-14 DIAGNOSIS — I70209 Unspecified atherosclerosis of native arteries of extremities, unspecified extremity: Secondary | ICD-10-CM

## 2018-01-14 DIAGNOSIS — I779 Disorder of arteries and arterioles, unspecified: Secondary | ICD-10-CM | POA: Diagnosis not present

## 2018-01-14 DIAGNOSIS — I509 Heart failure, unspecified: Secondary | ICD-10-CM | POA: Diagnosis not present

## 2018-01-14 DIAGNOSIS — Z95828 Presence of other vascular implants and grafts: Secondary | ICD-10-CM

## 2018-01-14 NOTE — Patient Instructions (Signed)
  To decrease swelling in your feet and legs: Elevate feet above slightly bent knees, feet above heart, overnight and 3-4 times per day for 20 minutes.   
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
## 2018-01-15 ENCOUNTER — Ambulatory Visit: Payer: Medicare Other | Admitting: Podiatry

## 2018-01-15 DIAGNOSIS — I129 Hypertensive chronic kidney disease with stage 1 through stage 4 chronic kidney disease, or unspecified chronic kidney disease: Secondary | ICD-10-CM | POA: Diagnosis not present

## 2018-01-15 DIAGNOSIS — N186 End stage renal disease: Secondary | ICD-10-CM | POA: Diagnosis not present

## 2018-01-15 DIAGNOSIS — D631 Anemia in chronic kidney disease: Secondary | ICD-10-CM | POA: Diagnosis not present

## 2018-01-15 DIAGNOSIS — D509 Iron deficiency anemia, unspecified: Secondary | ICD-10-CM | POA: Diagnosis not present

## 2018-01-15 DIAGNOSIS — N2581 Secondary hyperparathyroidism of renal origin: Secondary | ICD-10-CM | POA: Diagnosis not present

## 2018-01-18 DIAGNOSIS — I129 Hypertensive chronic kidney disease with stage 1 through stage 4 chronic kidney disease, or unspecified chronic kidney disease: Secondary | ICD-10-CM | POA: Diagnosis not present

## 2018-01-18 DIAGNOSIS — N2581 Secondary hyperparathyroidism of renal origin: Secondary | ICD-10-CM | POA: Diagnosis not present

## 2018-01-18 DIAGNOSIS — D509 Iron deficiency anemia, unspecified: Secondary | ICD-10-CM | POA: Diagnosis not present

## 2018-01-18 DIAGNOSIS — N186 End stage renal disease: Secondary | ICD-10-CM | POA: Diagnosis not present

## 2018-01-18 DIAGNOSIS — D631 Anemia in chronic kidney disease: Secondary | ICD-10-CM | POA: Diagnosis not present

## 2018-01-20 DIAGNOSIS — I10 Essential (primary) hypertension: Secondary | ICD-10-CM | POA: Diagnosis not present

## 2018-01-20 DIAGNOSIS — D631 Anemia in chronic kidney disease: Secondary | ICD-10-CM | POA: Diagnosis not present

## 2018-01-20 DIAGNOSIS — N186 End stage renal disease: Secondary | ICD-10-CM | POA: Diagnosis not present

## 2018-01-21 DIAGNOSIS — D631 Anemia in chronic kidney disease: Secondary | ICD-10-CM | POA: Diagnosis not present

## 2018-01-21 DIAGNOSIS — N186 End stage renal disease: Secondary | ICD-10-CM | POA: Diagnosis not present

## 2018-01-21 DIAGNOSIS — I129 Hypertensive chronic kidney disease with stage 1 through stage 4 chronic kidney disease, or unspecified chronic kidney disease: Secondary | ICD-10-CM | POA: Diagnosis not present

## 2018-01-21 DIAGNOSIS — D509 Iron deficiency anemia, unspecified: Secondary | ICD-10-CM | POA: Diagnosis not present

## 2018-01-23 DIAGNOSIS — N186 End stage renal disease: Secondary | ICD-10-CM | POA: Diagnosis not present

## 2018-01-23 DIAGNOSIS — D509 Iron deficiency anemia, unspecified: Secondary | ICD-10-CM | POA: Diagnosis not present

## 2018-01-23 DIAGNOSIS — I129 Hypertensive chronic kidney disease with stage 1 through stage 4 chronic kidney disease, or unspecified chronic kidney disease: Secondary | ICD-10-CM | POA: Diagnosis not present

## 2018-01-23 DIAGNOSIS — D631 Anemia in chronic kidney disease: Secondary | ICD-10-CM | POA: Diagnosis not present

## 2018-01-24 ENCOUNTER — Ambulatory Visit (INDEPENDENT_AMBULATORY_CARE_PROVIDER_SITE_OTHER): Payer: Medicare Other | Admitting: Orthopedic Surgery

## 2018-01-24 DIAGNOSIS — L97521 Non-pressure chronic ulcer of other part of left foot limited to breakdown of skin: Secondary | ICD-10-CM

## 2018-01-24 DIAGNOSIS — Z89431 Acquired absence of right foot: Secondary | ICD-10-CM | POA: Diagnosis not present

## 2018-01-25 ENCOUNTER — Encounter (INDEPENDENT_AMBULATORY_CARE_PROVIDER_SITE_OTHER): Payer: Self-pay | Admitting: Orthopedic Surgery

## 2018-01-25 DIAGNOSIS — I129 Hypertensive chronic kidney disease with stage 1 through stage 4 chronic kidney disease, or unspecified chronic kidney disease: Secondary | ICD-10-CM | POA: Diagnosis not present

## 2018-01-25 DIAGNOSIS — I10 Essential (primary) hypertension: Secondary | ICD-10-CM | POA: Diagnosis not present

## 2018-01-25 DIAGNOSIS — N186 End stage renal disease: Secondary | ICD-10-CM | POA: Diagnosis not present

## 2018-01-25 DIAGNOSIS — Z89431 Acquired absence of right foot: Secondary | ICD-10-CM | POA: Diagnosis not present

## 2018-01-25 DIAGNOSIS — D631 Anemia in chronic kidney disease: Secondary | ICD-10-CM | POA: Diagnosis not present

## 2018-01-25 DIAGNOSIS — L97529 Non-pressure chronic ulcer of other part of left foot with unspecified severity: Secondary | ICD-10-CM | POA: Diagnosis not present

## 2018-01-25 DIAGNOSIS — D509 Iron deficiency anemia, unspecified: Secondary | ICD-10-CM | POA: Diagnosis not present
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
## 2018-01-28 DIAGNOSIS — N186 End stage renal disease: Secondary | ICD-10-CM | POA: Diagnosis not present

## 2018-01-28 DIAGNOSIS — I129 Hypertensive chronic kidney disease with stage 1 through stage 4 chronic kidney disease, or unspecified chronic kidney disease: Secondary | ICD-10-CM | POA: Diagnosis not present

## 2018-01-28 DIAGNOSIS — D509 Iron deficiency anemia, unspecified: Secondary | ICD-10-CM | POA: Diagnosis not present

## 2018-01-28 DIAGNOSIS — D631 Anemia in chronic kidney disease: Secondary | ICD-10-CM | POA: Diagnosis not present

## 2018-01-29 ENCOUNTER — Ambulatory Visit: Payer: Medicare Other | Admitting: Cardiology
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
## 2018-01-30 DIAGNOSIS — I129 Hypertensive chronic kidney disease with stage 1 through stage 4 chronic kidney disease, or unspecified chronic kidney disease: Secondary | ICD-10-CM | POA: Diagnosis not present

## 2018-01-30 DIAGNOSIS — D509 Iron deficiency anemia, unspecified: Secondary | ICD-10-CM | POA: Diagnosis not present

## 2018-01-30 DIAGNOSIS — N186 End stage renal disease: Secondary | ICD-10-CM | POA: Diagnosis not present

## 2018-01-30 DIAGNOSIS — D631 Anemia in chronic kidney disease: Secondary | ICD-10-CM | POA: Diagnosis not present

## 2018-01-31 DIAGNOSIS — N186 End stage renal disease: Secondary | ICD-10-CM | POA: Diagnosis not present

## 2018-01-31 DIAGNOSIS — I779 Disorder of arteries and arterioles, unspecified: Secondary | ICD-10-CM | POA: Diagnosis not present

## 2018-02-01 DIAGNOSIS — D631 Anemia in chronic kidney disease: Secondary | ICD-10-CM | POA: Diagnosis not present

## 2018-02-01 DIAGNOSIS — I739 Peripheral vascular disease, unspecified: Secondary | ICD-10-CM | POA: Diagnosis not present

## 2018-02-01 DIAGNOSIS — I129 Hypertensive chronic kidney disease with stage 1 through stage 4 chronic kidney disease, or unspecified chronic kidney disease: Secondary | ICD-10-CM | POA: Diagnosis not present

## 2018-02-01 DIAGNOSIS — N186 End stage renal disease: Secondary | ICD-10-CM | POA: Diagnosis not present

## 2018-02-01 DIAGNOSIS — L97529 Non-pressure chronic ulcer of other part of left foot with unspecified severity: Secondary | ICD-10-CM | POA: Diagnosis not present

## 2018-02-01 DIAGNOSIS — D509 Iron deficiency anemia, unspecified: Secondary | ICD-10-CM | POA: Diagnosis not present

## 2018-02-04 DIAGNOSIS — N186 End stage renal disease: Secondary | ICD-10-CM | POA: Diagnosis not present

## 2018-02-04 DIAGNOSIS — D631 Anemia in chronic kidney disease: Secondary | ICD-10-CM | POA: Diagnosis not present

## 2018-02-04 DIAGNOSIS — D509 Iron deficiency anemia, unspecified: Secondary | ICD-10-CM | POA: Diagnosis not present

## 2018-02-04 DIAGNOSIS — J449 Chronic obstructive pulmonary disease, unspecified: Secondary | ICD-10-CM | POA: Diagnosis not present

## 2018-02-04 DIAGNOSIS — I4891 Unspecified atrial fibrillation: Secondary | ICD-10-CM | POA: Diagnosis not present

## 2018-02-04 DIAGNOSIS — I509 Heart failure, unspecified: Secondary | ICD-10-CM | POA: Diagnosis not present

## 2018-02-04 DIAGNOSIS — I129 Hypertensive chronic kidney disease with stage 1 through stage 4 chronic kidney disease, or unspecified chronic kidney disease: Secondary | ICD-10-CM | POA: Diagnosis not present

## 2018-02-05 ENCOUNTER — Ambulatory Visit: Payer: Medicare Other | Admitting: Family Medicine

## 2018-02-06 DIAGNOSIS — D631 Anemia in chronic kidney disease: Secondary | ICD-10-CM | POA: Diagnosis not present

## 2018-02-06 DIAGNOSIS — D509 Iron deficiency anemia, unspecified: Secondary | ICD-10-CM | POA: Diagnosis not present

## 2018-02-06 DIAGNOSIS — I129 Hypertensive chronic kidney disease with stage 1 through stage 4 chronic kidney disease, or unspecified chronic kidney disease: Secondary | ICD-10-CM | POA: Diagnosis not present

## 2018-02-06 DIAGNOSIS — N186 End stage renal disease: Secondary | ICD-10-CM | POA: Diagnosis not present

## 2018-02-07 ENCOUNTER — Encounter (HOSPITAL_COMMUNITY): Payer: Medicare Other

## 2018-02-07 ENCOUNTER — Ambulatory Visit: Payer: Medicare Other | Admitting: Family

## 2018-02-08 DIAGNOSIS — I129 Hypertensive chronic kidney disease with stage 1 through stage 4 chronic kidney disease, or unspecified chronic kidney disease: Secondary | ICD-10-CM | POA: Diagnosis not present

## 2018-02-08 DIAGNOSIS — D631 Anemia in chronic kidney disease: Secondary | ICD-10-CM | POA: Diagnosis not present

## 2018-02-08 DIAGNOSIS — N186 End stage renal disease: Secondary | ICD-10-CM | POA: Diagnosis not present

## 2018-02-08 DIAGNOSIS — D509 Iron deficiency anemia, unspecified: Secondary | ICD-10-CM | POA: Diagnosis not present

## 2018-02-10 DIAGNOSIS — Z89431 Acquired absence of right foot: Secondary | ICD-10-CM | POA: Diagnosis not present

## 2018-02-10 DIAGNOSIS — I10 Essential (primary) hypertension: Secondary | ICD-10-CM | POA: Diagnosis not present

## 2018-02-10 DIAGNOSIS — E119 Type 2 diabetes mellitus without complications: Secondary | ICD-10-CM | POA: Diagnosis not present

## 2018-02-10 DIAGNOSIS — J449 Chronic obstructive pulmonary disease, unspecified: Secondary | ICD-10-CM | POA: Diagnosis not present

## 2018-02-11 DIAGNOSIS — I129 Hypertensive chronic kidney disease with stage 1 through stage 4 chronic kidney disease, or unspecified chronic kidney disease: Secondary | ICD-10-CM | POA: Diagnosis not present

## 2018-02-11 DIAGNOSIS — R05 Cough: Secondary | ICD-10-CM | POA: Diagnosis not present

## 2018-02-11 DIAGNOSIS — D631 Anemia in chronic kidney disease: Secondary | ICD-10-CM | POA: Diagnosis not present

## 2018-02-11 DIAGNOSIS — D509 Iron deficiency anemia, unspecified: Secondary | ICD-10-CM | POA: Diagnosis not present

## 2018-02-11 DIAGNOSIS — N186 End stage renal disease: Secondary | ICD-10-CM | POA: Diagnosis not present

## 2018-02-12 DIAGNOSIS — D509 Iron deficiency anemia, unspecified: Secondary | ICD-10-CM | POA: Diagnosis not present

## 2018-02-12 DIAGNOSIS — I129 Hypertensive chronic kidney disease with stage 1 through stage 4 chronic kidney disease, or unspecified chronic kidney disease: Secondary | ICD-10-CM | POA: Diagnosis not present

## 2018-02-12 DIAGNOSIS — D631 Anemia in chronic kidney disease: Secondary | ICD-10-CM | POA: Diagnosis not present

## 2018-02-12 DIAGNOSIS — N186 End stage renal disease: Secondary | ICD-10-CM | POA: Diagnosis not present

## 2018-02-15 DIAGNOSIS — N186 End stage renal disease: Secondary | ICD-10-CM | POA: Diagnosis not present

## 2018-02-15 DIAGNOSIS — I129 Hypertensive chronic kidney disease with stage 1 through stage 4 chronic kidney disease, or unspecified chronic kidney disease: Secondary | ICD-10-CM | POA: Diagnosis not present

## 2018-02-15 DIAGNOSIS — D631 Anemia in chronic kidney disease: Secondary | ICD-10-CM | POA: Diagnosis not present

## 2018-02-15 DIAGNOSIS — D509 Iron deficiency anemia, unspecified: Secondary | ICD-10-CM | POA: Diagnosis not present

## 2018-02-17 DIAGNOSIS — D631 Anemia in chronic kidney disease: Secondary | ICD-10-CM | POA: Diagnosis not present

## 2018-02-17 DIAGNOSIS — D509 Iron deficiency anemia, unspecified: Secondary | ICD-10-CM | POA: Diagnosis not present

## 2018-02-17 DIAGNOSIS — I129 Hypertensive chronic kidney disease with stage 1 through stage 4 chronic kidney disease, or unspecified chronic kidney disease: Secondary | ICD-10-CM | POA: Diagnosis not present

## 2018-02-17 DIAGNOSIS — N186 End stage renal disease: Secondary | ICD-10-CM | POA: Diagnosis not present

## 2018-02-20 DEATH — deceased

## 2018-02-21 ENCOUNTER — Ambulatory Visit (INDEPENDENT_AMBULATORY_CARE_PROVIDER_SITE_OTHER): Payer: Medicare Other | Admitting: Orthopedic Surgery
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
## 2018-02-28 ENCOUNTER — Ambulatory Visit: Payer: Medicare Other | Admitting: Cardiology

## 2018-03-01 ENCOUNTER — Encounter: Payer: Self-pay | Admitting: Cardiology

## 2018-03-01 ENCOUNTER — Other Ambulatory Visit: Payer: Self-pay | Admitting: Family

## 2018-03-01 DIAGNOSIS — I739 Peripheral vascular disease, unspecified: Secondary | ICD-10-CM

## 2018-04-16 ENCOUNTER — Encounter (HOSPITAL_COMMUNITY): Payer: Medicare Other

## 2018-04-16 ENCOUNTER — Ambulatory Visit: Payer: Medicare Other | Admitting: Family

## 2018-06-07 ENCOUNTER — Other Ambulatory Visit: Payer: Self-pay | Admitting: *Deleted

## 2018-06-07 NOTE — Patient Outreach (Signed)
[facility]) Care Management  06/07/2018  Terry Martinez. 08/01/1940 [id number]   Attempted to reach patient to complete Medicare high risk screening call. Terry Martinez, patient's sister answered at home contact number. [name] stated patient died on 2018/03/02 at [facility] facility. Will change status of patient to deceased in [facility] record.  Barrington Ellison RN,CCM,CDE [place] Management Coordinator Office Phone [phone] Office Fax [phone]

## 2018-07-02 IMAGING — DX DG CHEST 2V
2 series · 2 of 2 positions shown · non-contrast
Comparison: Radiographs February 06, 2013.

CLINICAL DATA: Chest pain.

EXAM:
CHEST  2 VIEW

[dg chest 2 view (1 of 2)]
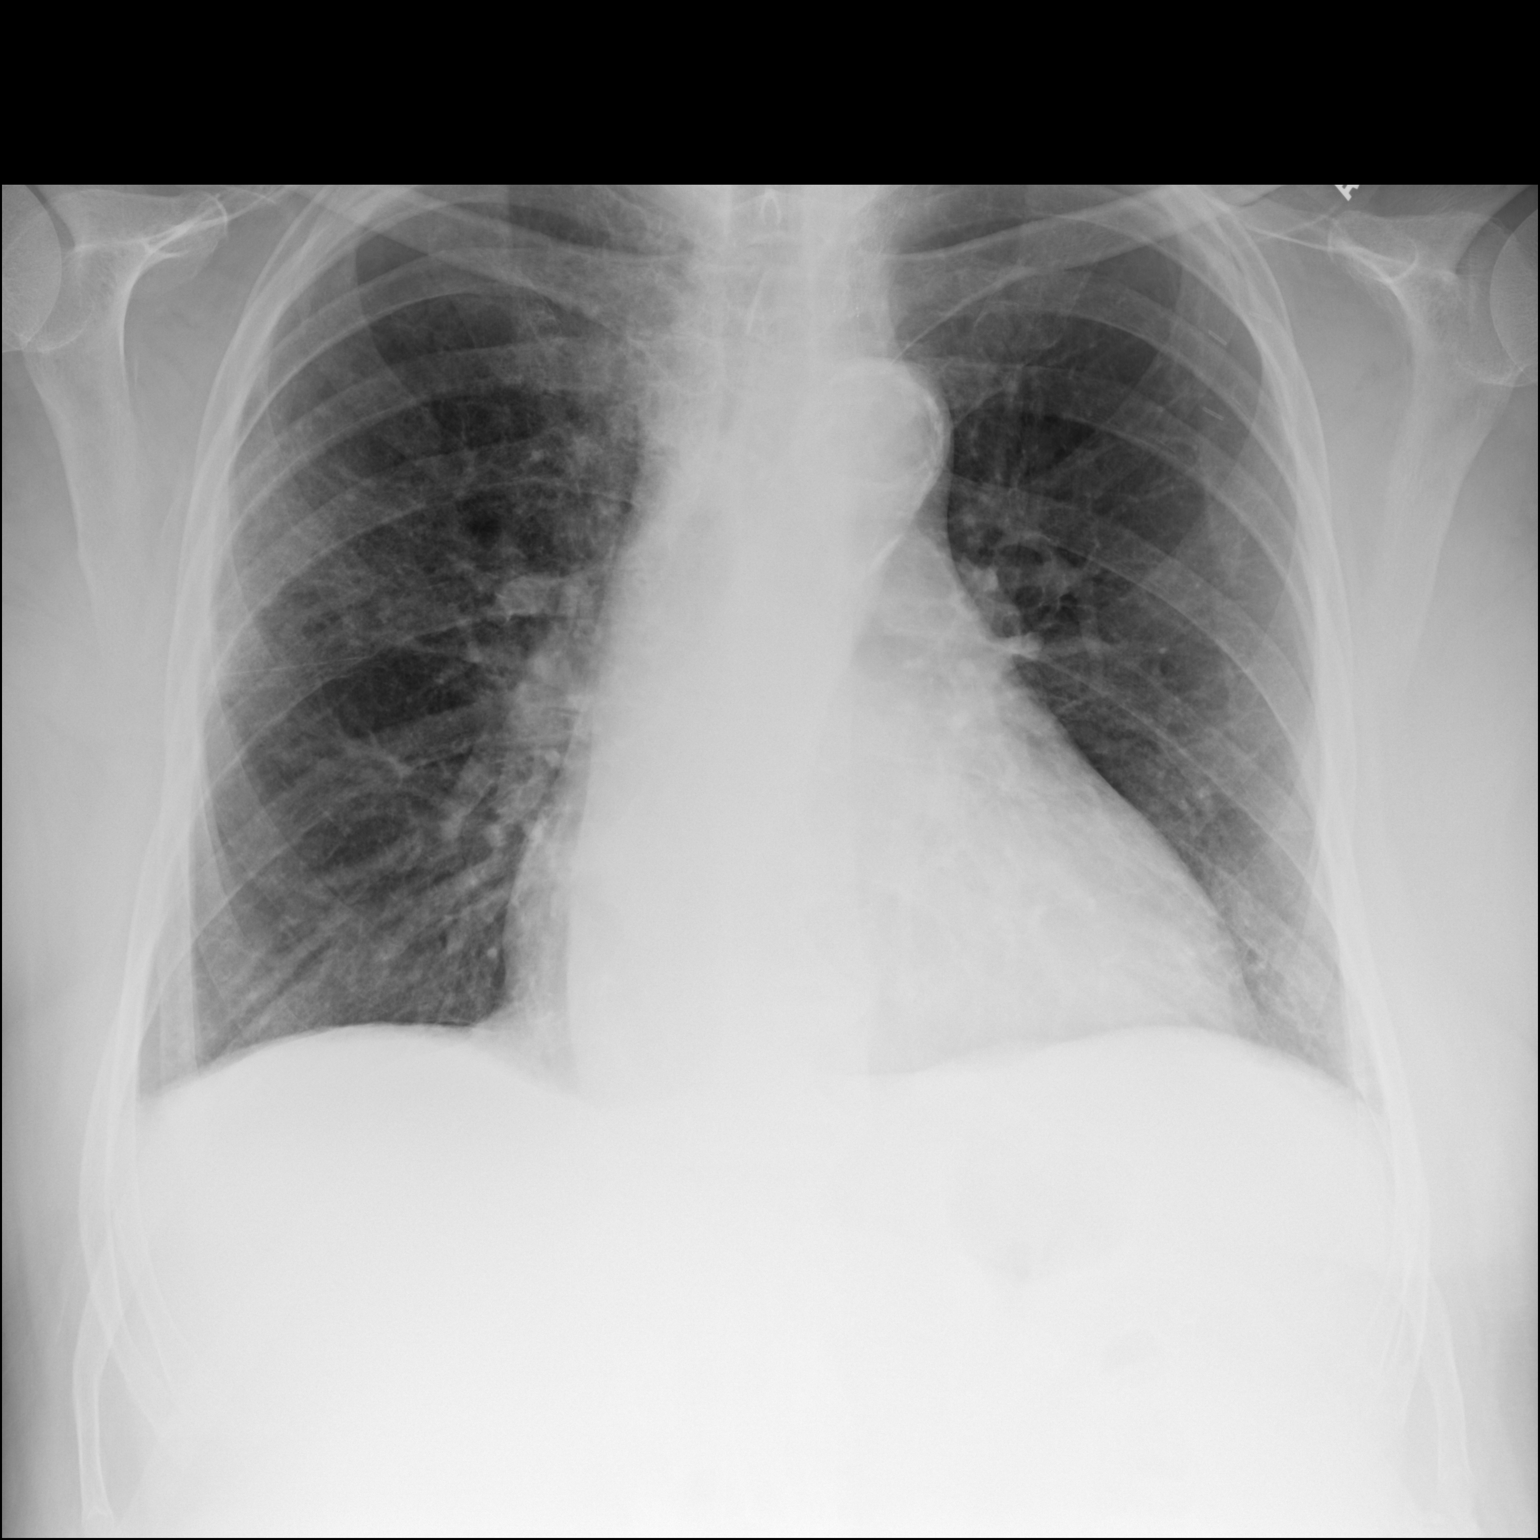

[dg chest 2 view (2 of 2)]
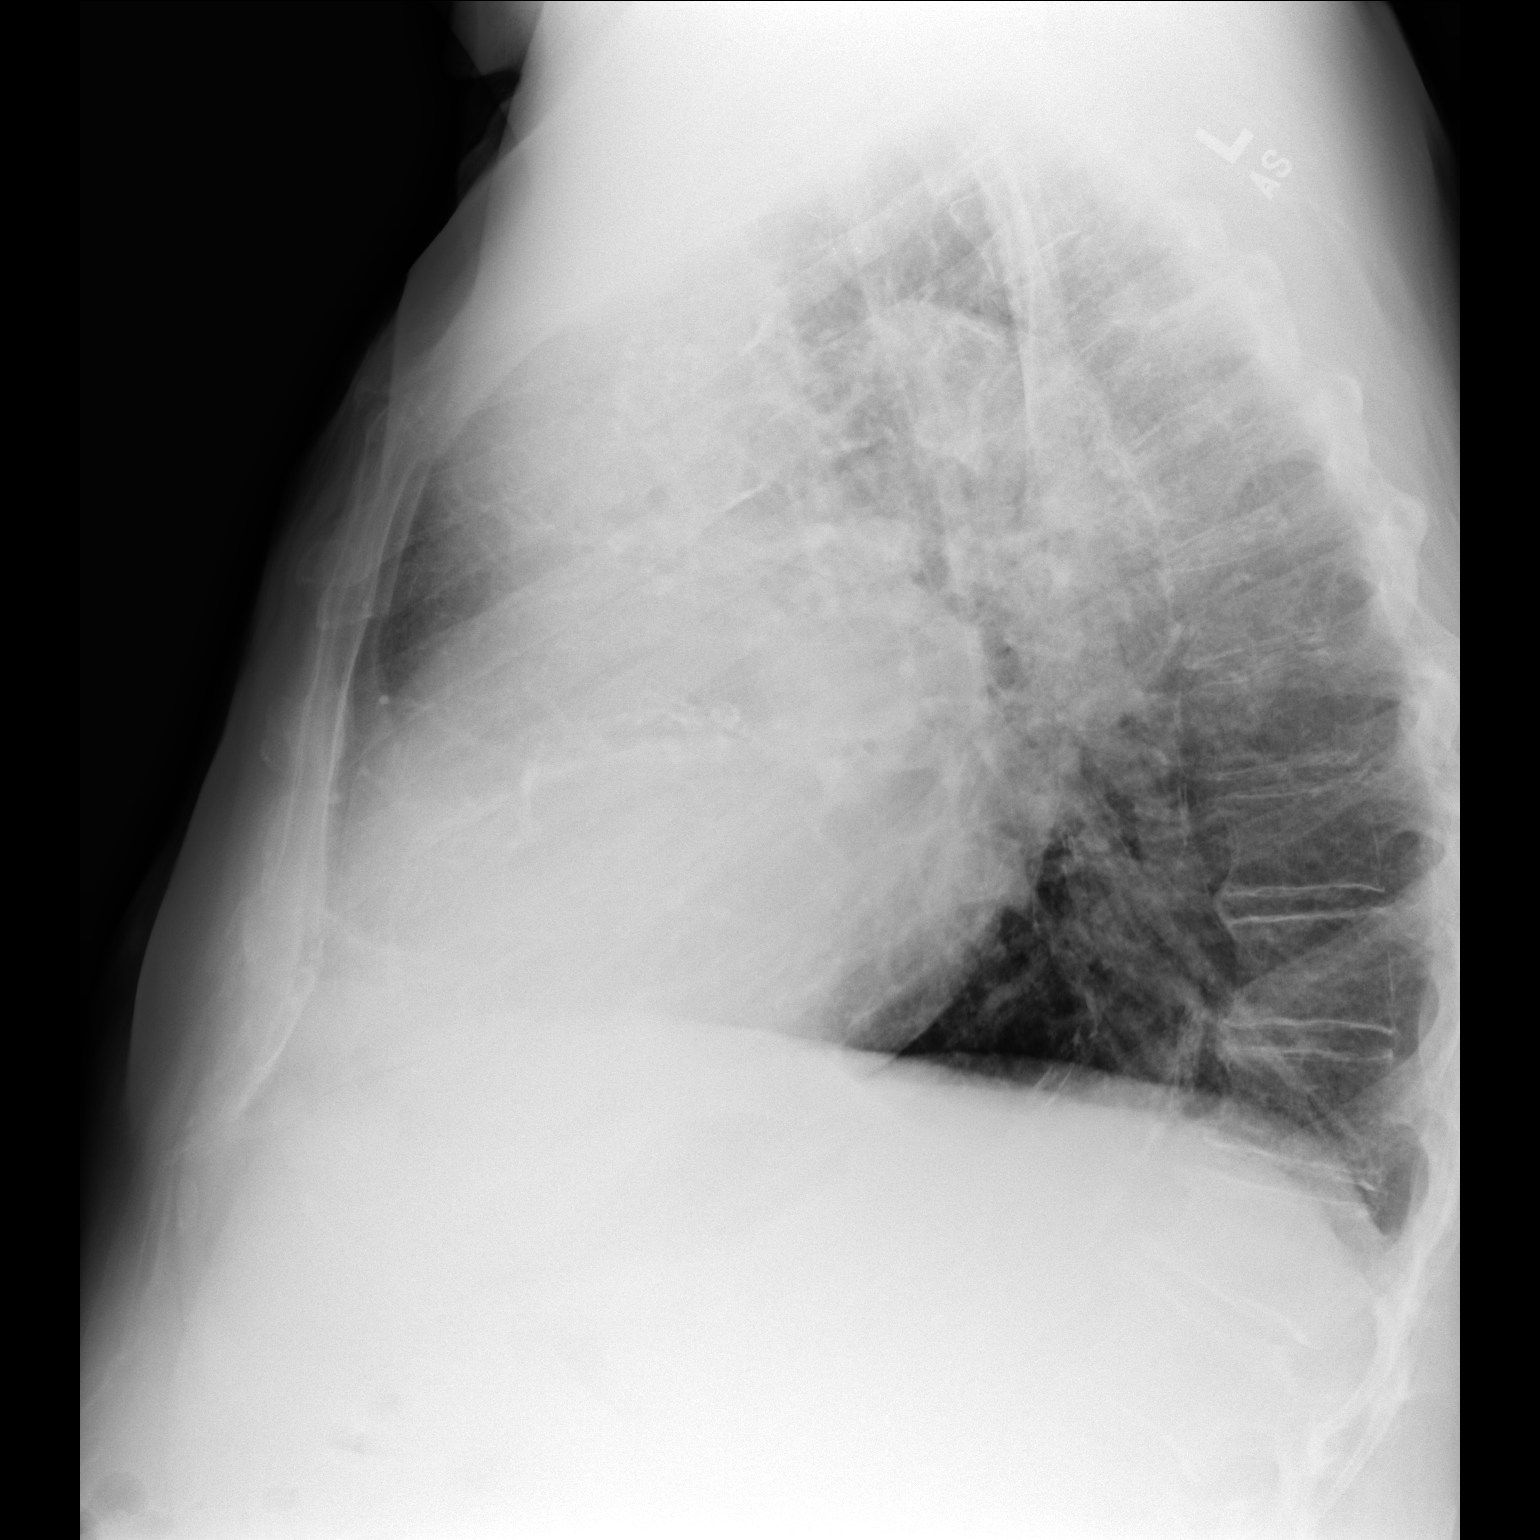

[2 of 2 positions shown; findings below may reference images not displayed]

FINDINGS: Stable cardiomediastinal silhouette. Atherosclerosis of thoracic
aorta is noted. No pneumothorax or pleural effusion is noted. Both
lungs are clear. The visualized skeletal structures are
unremarkable.
IMPRESSION: No active cardiopulmonary disease.  Aortic atherosclerosis.

## 2019-01-18 IMAGING — DX DG CHEST 1V PORT
1 series · 1 of 1 positions shown · non-contrast
Comparison: 05/02/2017

CLINICAL DATA: Shortness of breath, cough

EXAM:
PORTABLE CHEST 1 VIEW

[chest ap]
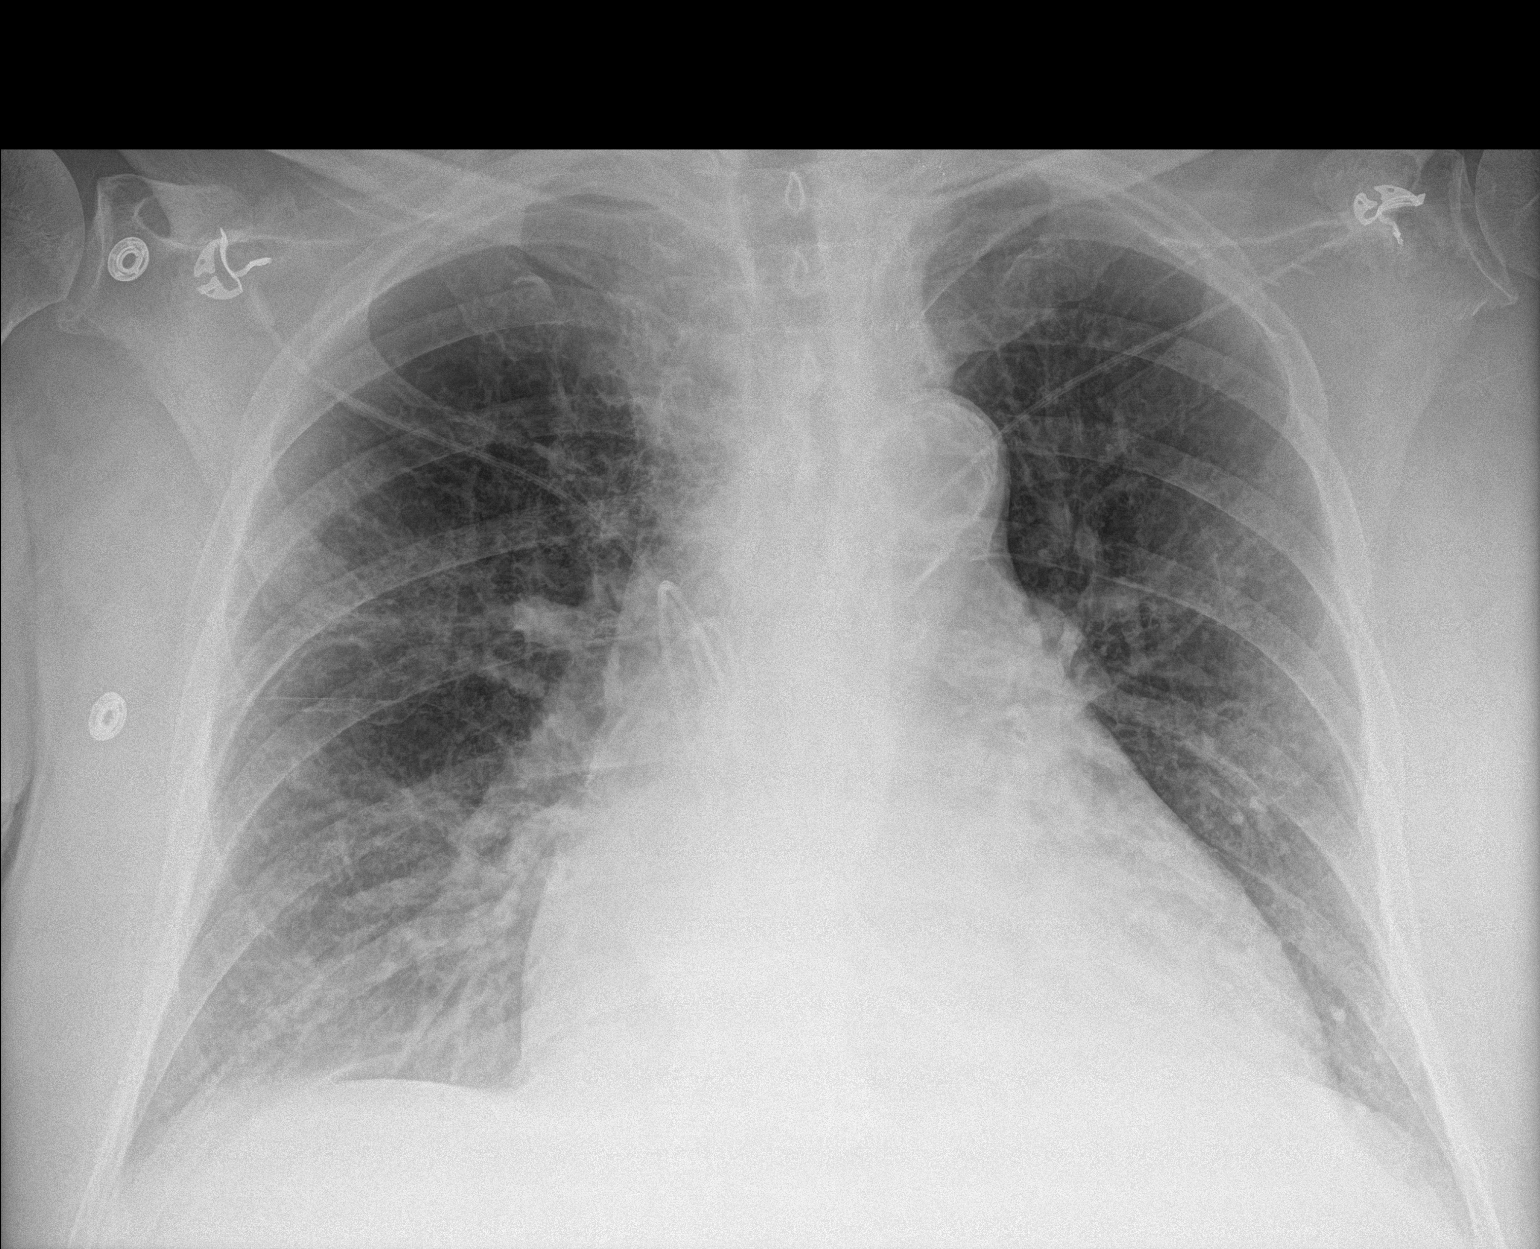

[1 of 1 positions shown; findings below may reference images not displayed]

FINDINGS: There is mild bilateral interstitial prominence. There is no focal
parenchymal opacity. There is no pleural effusion or pneumothorax.
There is stable cardiomegaly. There is thoracic aortic
atherosclerosis.

The osseous structures are unremarkable.
IMPRESSION: Cardiomegaly with mild pulmonary vascular congestion.

## 2019-02-01 IMAGING — US US ABDOMEN LIMITED
1 series · 3 of 3 positions shown · non-contrast
Comparison: Renal ultrasound 05/04/2017

CLINICAL DATA: 76-year-old with chronic kidney disease. Patient was
scheduled for a renal artery duplex.

EXAM:
ULTRASOUND ABDOMEN LIMITED

[Series 1: us abdomen limited · 0.38mm/px · 3 of 3 slices shown]
[im 1/3]
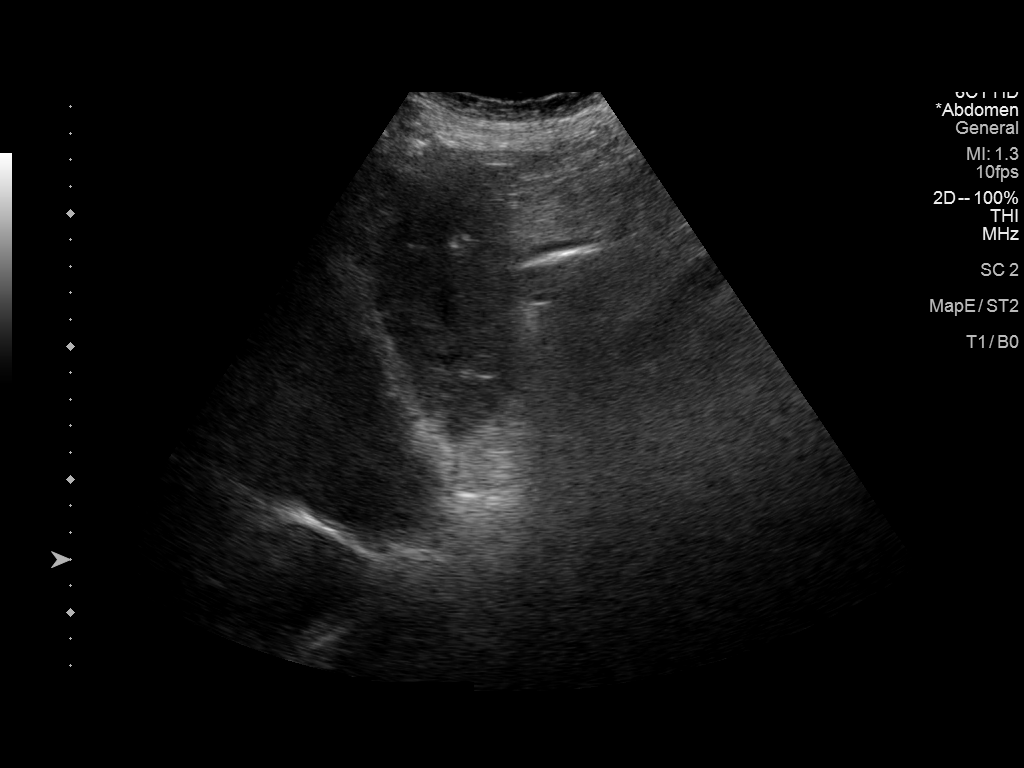
[im 2/3]
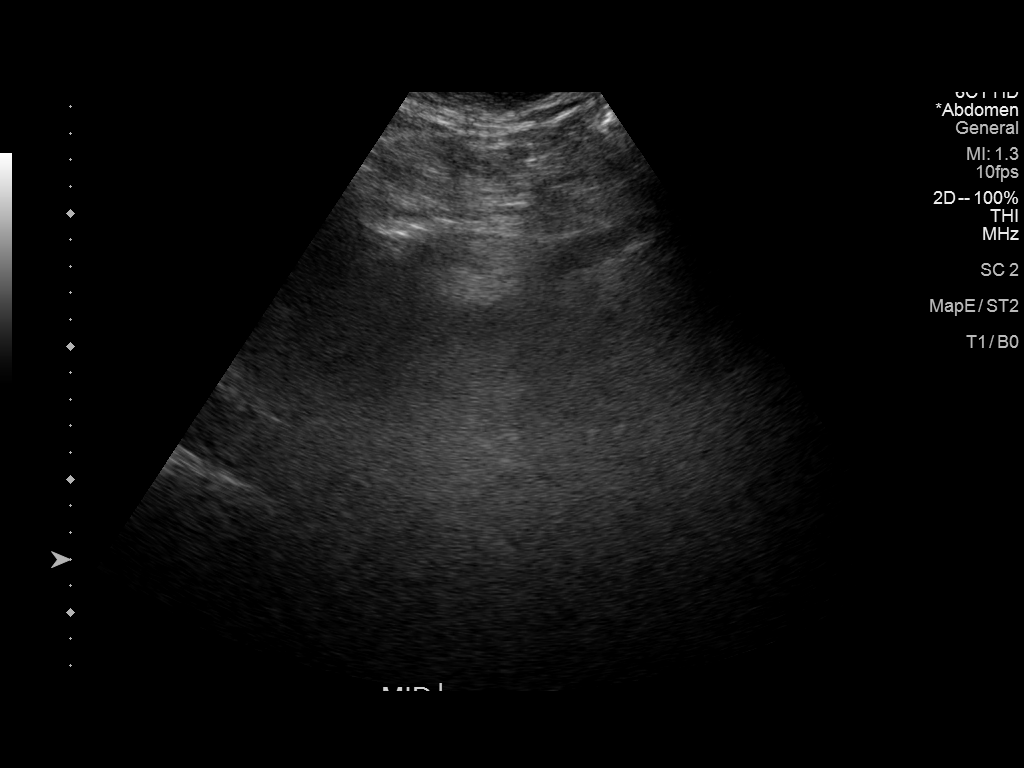
[im 3/3]
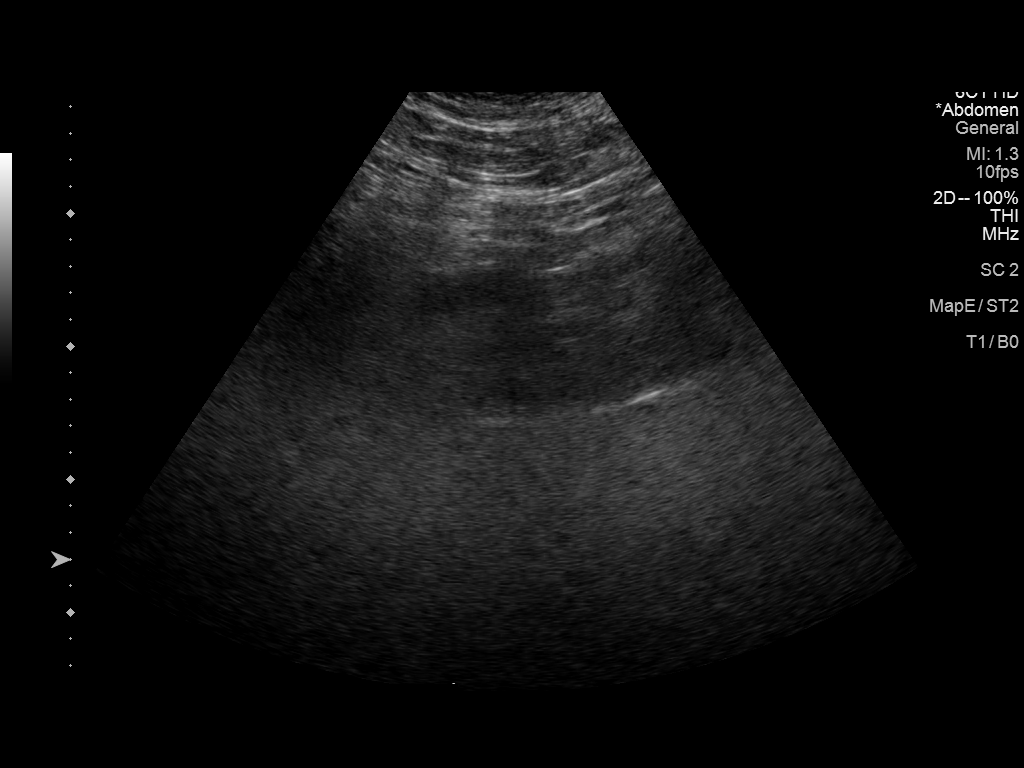

[3 of 3 positions shown; findings below may reference images not displayed]

FINDINGS: Renal artery duplex was not performed because the abdominal aorta
could not be visualized due to bowel gas.
IMPRESSION: Renal artery duplex not performed because the abdominal aorta could
not be visualized.

Based on patient's chronic kidney disease, consider further
characterization of the renal arteries with a non contrast MRA of
the renals.

## 2019-04-29 IMAGING — DX DG ABDOMEN 1V
2 series · 3 of 3 positions shown · non-contrast
Comparison: None.

CLINICAL DATA: Follow-up video capsule

EXAM:
ABDOMEN - 1 VIEW

[abdomen kub (1 of 2)]
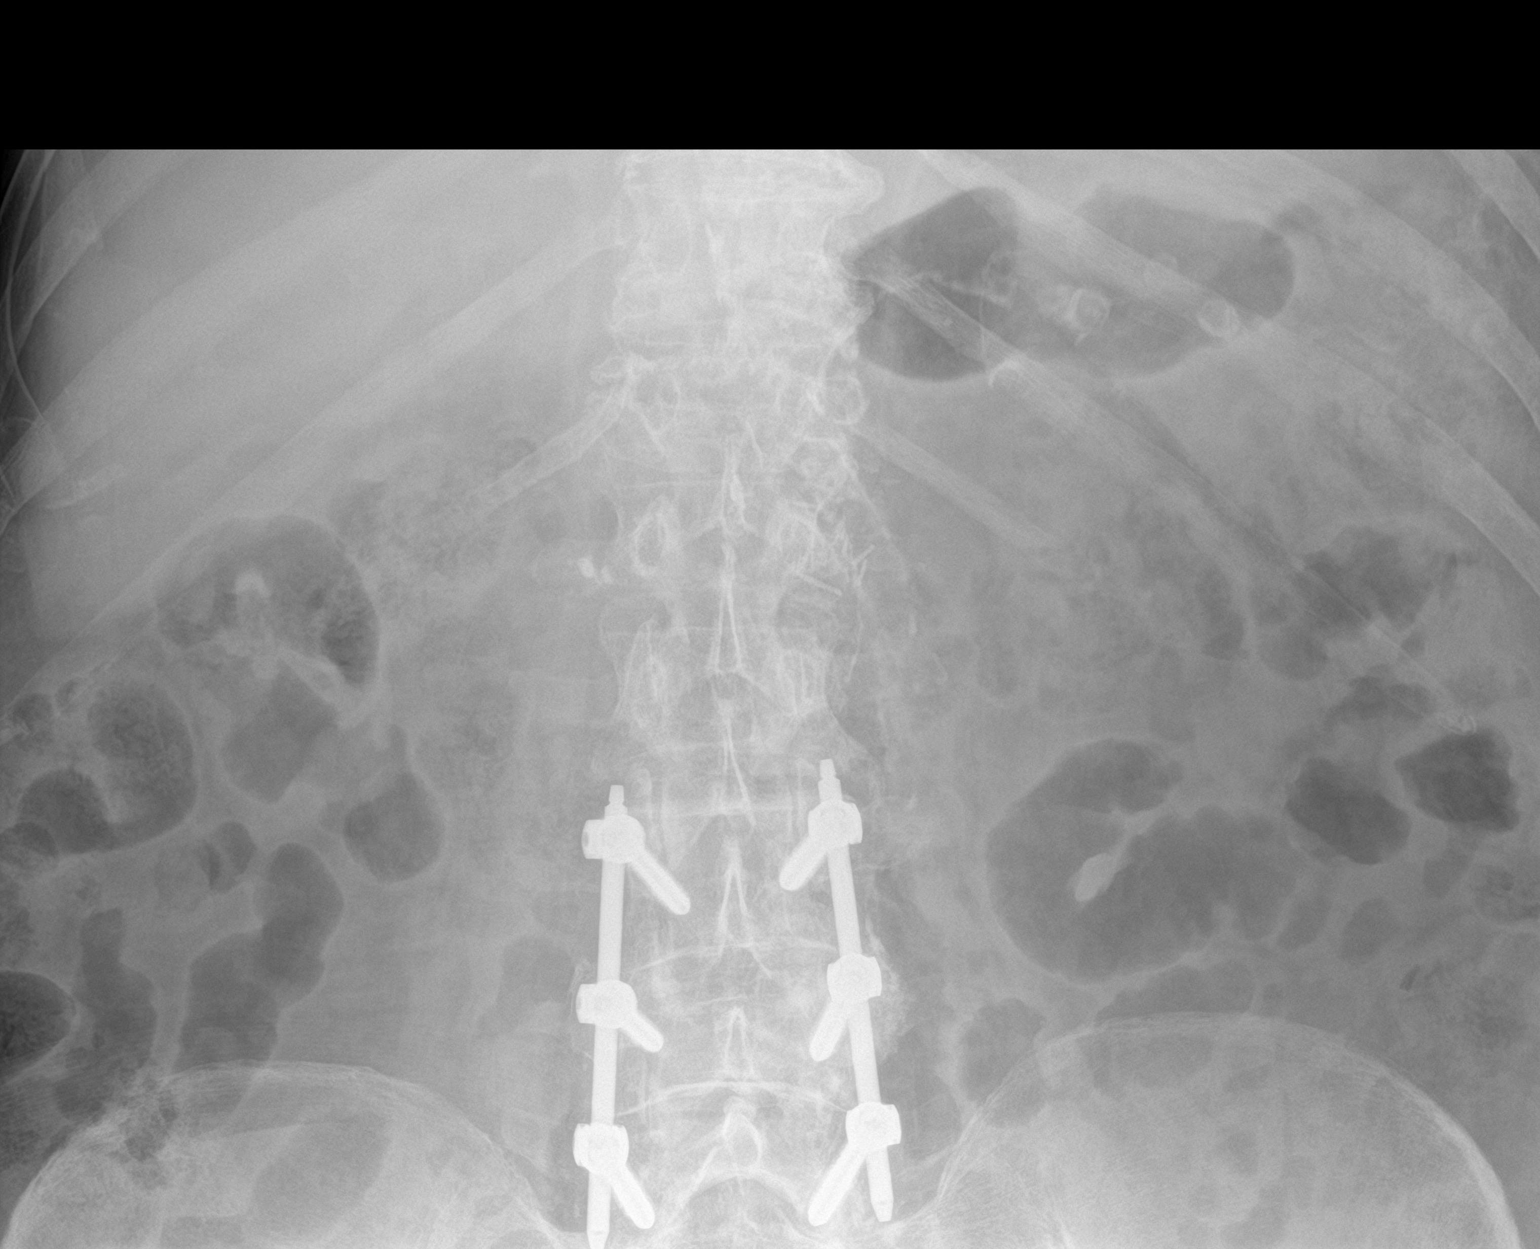

[Series 2: abdomen kub · 0.14mm/px · 2 of 2 slices shown (2 of 2)]
[im 1/2]
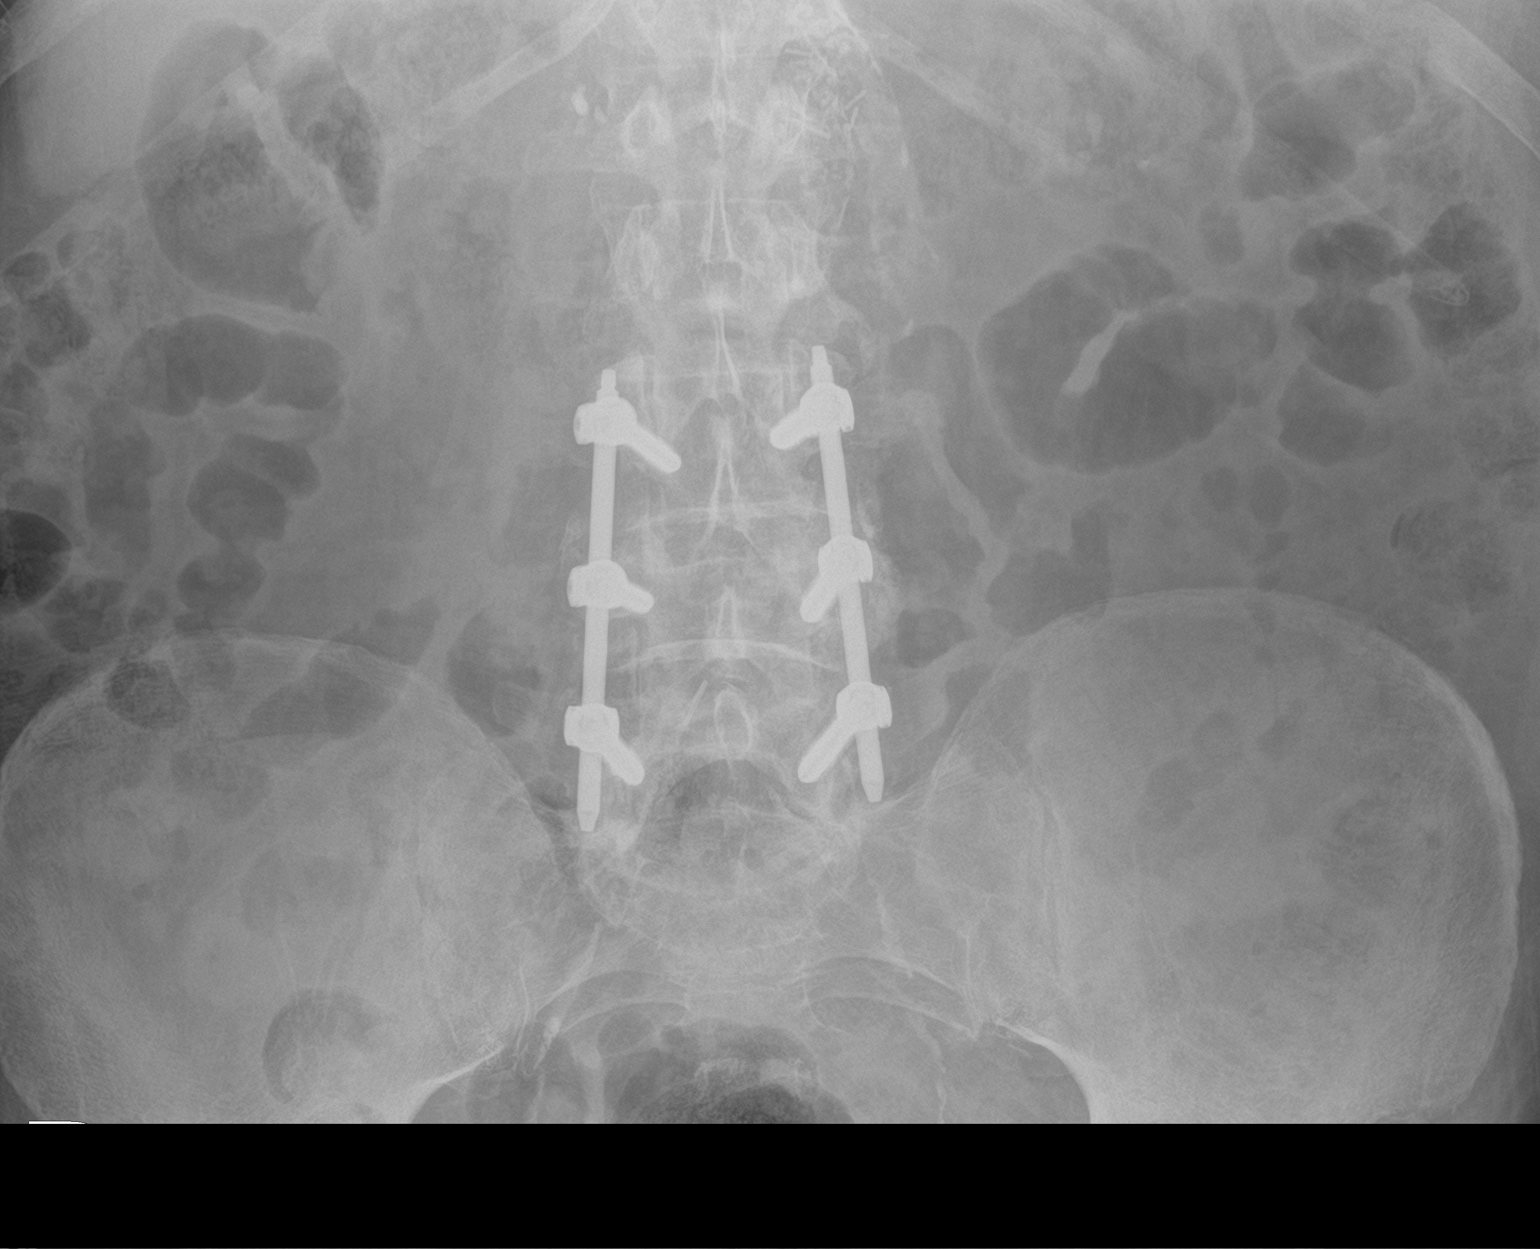
[im 2/2]
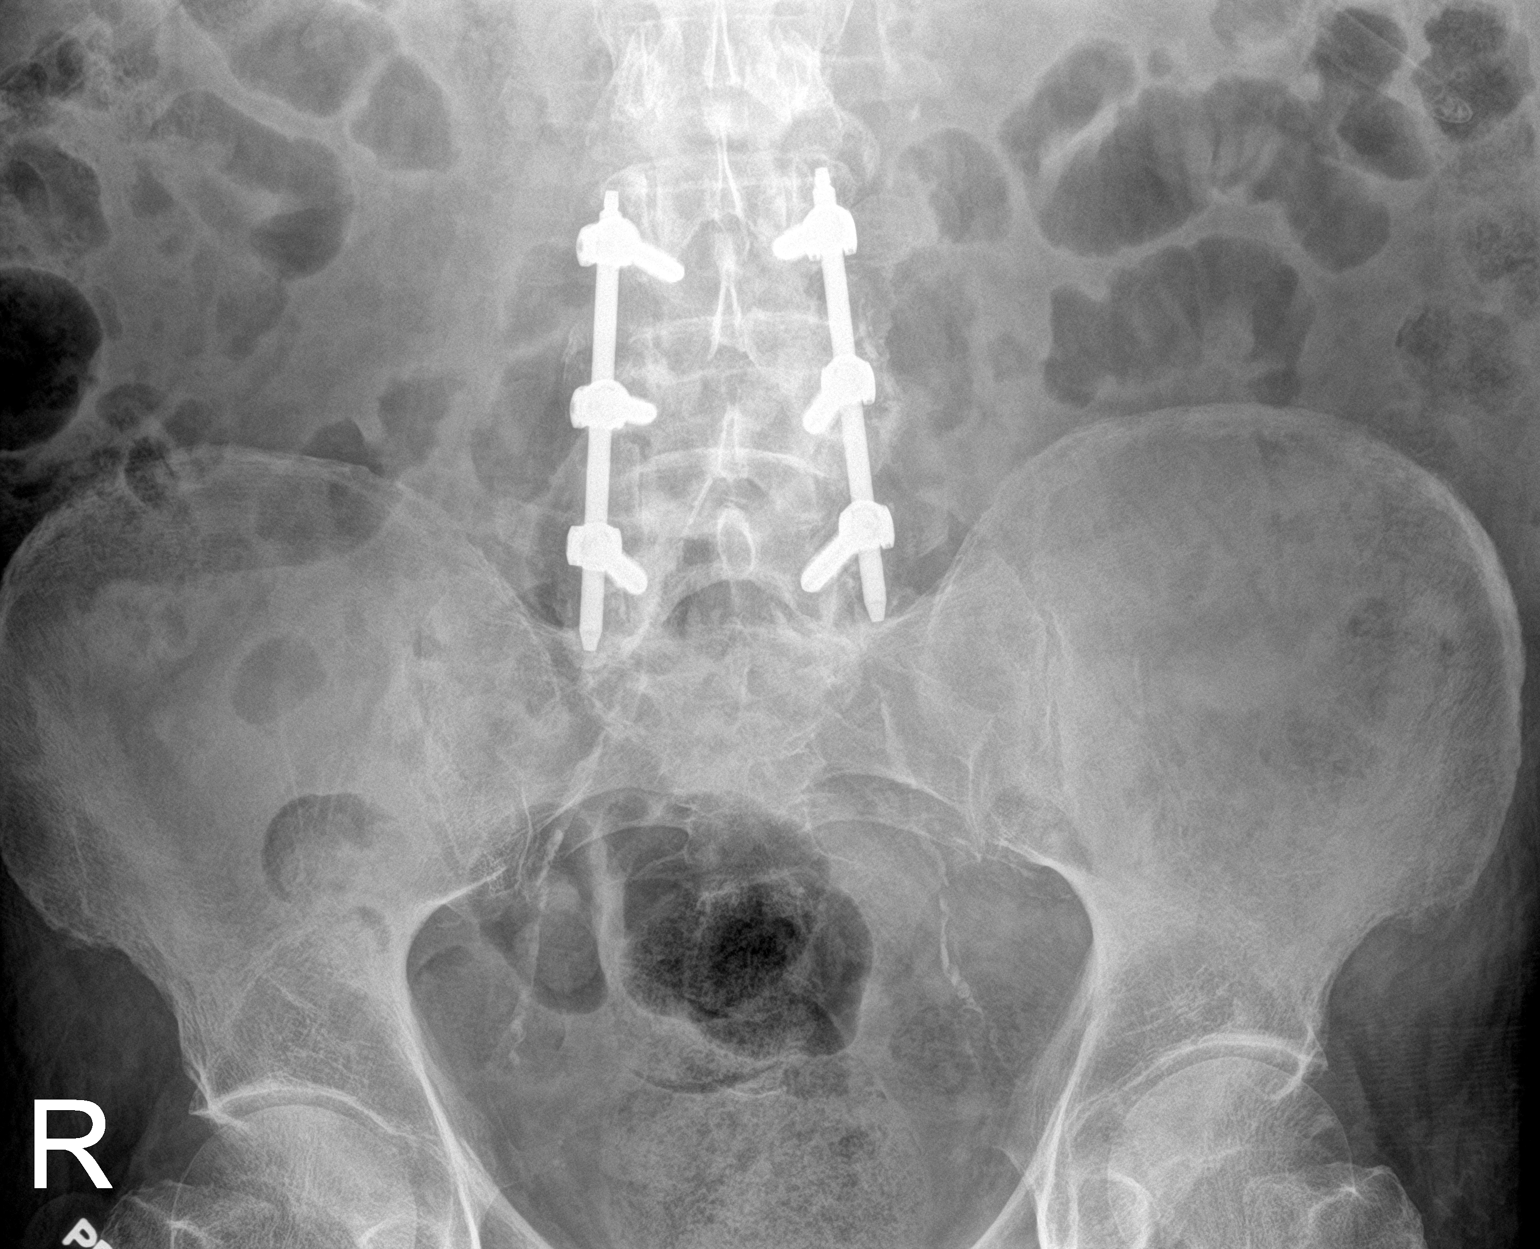

[3 of 3 positions shown; findings below may reference images not displayed]

FINDINGS: Scattered large and small bowel gas is noted. No abnormal mass or
abnormal calcifications are seen. Postsurgical changes in the lower
lumbar spine are noted. No radiopaque density is identified to
correspond with the given clinical history of video capsule although
no far lateral portions of the abdomen and the most inferior aspect
of the rectum are not visualized on this exam.
IMPRESSION: No definitive video capsule is noted.

## 2019-12-15 NOTE — Telephone Encounter (Signed)
error
